# Patient Record
Sex: Male | Born: 1940 | Race: White | Hispanic: No | Marital: Married | Smoking: Never smoker
Health system: Southern US, Community
[De-identification: ages and names within clinical notes are randomized; demographics above are authoritative.]

## PROBLEM LIST (undated history)

## (undated) DIAGNOSIS — E785 Hyperlipidemia, unspecified: Secondary | ICD-10-CM

## (undated) DIAGNOSIS — Z86711 Personal history of pulmonary embolism: Secondary | ICD-10-CM

## (undated) DIAGNOSIS — I4892 Unspecified atrial flutter: Secondary | ICD-10-CM

## (undated) DIAGNOSIS — M1990 Unspecified osteoarthritis, unspecified site: Secondary | ICD-10-CM

## (undated) DIAGNOSIS — N183 Chronic kidney disease, stage 3 unspecified: Secondary | ICD-10-CM

## (undated) DIAGNOSIS — K219 Gastro-esophageal reflux disease without esophagitis: Secondary | ICD-10-CM

## (undated) DIAGNOSIS — J942 Hemothorax: Secondary | ICD-10-CM

## (undated) DIAGNOSIS — H9190 Unspecified hearing loss, unspecified ear: Secondary | ICD-10-CM

## (undated) DIAGNOSIS — I639 Cerebral infarction, unspecified: Secondary | ICD-10-CM

## (undated) DIAGNOSIS — J90 Pleural effusion, not elsewhere classified: Secondary | ICD-10-CM

## (undated) DIAGNOSIS — J449 Chronic obstructive pulmonary disease, unspecified: Secondary | ICD-10-CM

## (undated) DIAGNOSIS — I493 Ventricular premature depolarization: Secondary | ICD-10-CM

## (undated) DIAGNOSIS — M109 Gout, unspecified: Secondary | ICD-10-CM

## (undated) DIAGNOSIS — I82629 Acute embolism and thrombosis of deep veins of unspecified upper extremity: Secondary | ICD-10-CM

## (undated) DIAGNOSIS — Z951 Presence of aortocoronary bypass graft: Secondary | ICD-10-CM

## (undated) DIAGNOSIS — I5022 Chronic systolic (congestive) heart failure: Secondary | ICD-10-CM

## (undated) DIAGNOSIS — A419 Sepsis, unspecified organism: Secondary | ICD-10-CM

## (undated) DIAGNOSIS — Z7901 Long term (current) use of anticoagulants: Secondary | ICD-10-CM

## (undated) DIAGNOSIS — I2510 Atherosclerotic heart disease of native coronary artery without angina pectoris: Secondary | ICD-10-CM

## (undated) DIAGNOSIS — I255 Ischemic cardiomyopathy: Secondary | ICD-10-CM

## (undated) DIAGNOSIS — I10 Essential (primary) hypertension: Secondary | ICD-10-CM

## (undated) DIAGNOSIS — Q6102 Congenital multiple renal cysts: Secondary | ICD-10-CM

## (undated) DIAGNOSIS — E669 Obesity, unspecified: Secondary | ICD-10-CM

## (undated) DIAGNOSIS — D649 Anemia, unspecified: Secondary | ICD-10-CM

## (undated) DIAGNOSIS — I5020 Unspecified systolic (congestive) heart failure: Secondary | ICD-10-CM

## (undated) DIAGNOSIS — J45909 Unspecified asthma, uncomplicated: Secondary | ICD-10-CM

## (undated) DIAGNOSIS — Z8679 Personal history of other diseases of the circulatory system: Secondary | ICD-10-CM

## (undated) DIAGNOSIS — I700 Atherosclerosis of aorta: Secondary | ICD-10-CM

## (undated) DIAGNOSIS — Z950 Presence of cardiac pacemaker: Secondary | ICD-10-CM

## (undated) DIAGNOSIS — I442 Atrioventricular block, complete: Secondary | ICD-10-CM

## (undated) DIAGNOSIS — N138 Other obstructive and reflux uropathy: Secondary | ICD-10-CM

## (undated) DIAGNOSIS — N401 Enlarged prostate with lower urinary tract symptoms: Secondary | ICD-10-CM

## (undated) DIAGNOSIS — I6789 Other cerebrovascular disease: Secondary | ICD-10-CM

## (undated) DIAGNOSIS — I824Y9 Acute embolism and thrombosis of unspecified deep veins of unspecified proximal lower extremity: Secondary | ICD-10-CM

## (undated) DIAGNOSIS — R7303 Prediabetes: Secondary | ICD-10-CM

## (undated) DIAGNOSIS — N184 Chronic kidney disease, stage 4 (severe): Secondary | ICD-10-CM

## (undated) HISTORY — PX: IVC FILTER INSERTION: CATH118245

## (undated) HISTORY — PX: LUMBAR DISC SURGERY: SHX700

## (undated) HISTORY — PX: INSERT / REPLACE / REMOVE PACEMAKER: SUR710

## (undated) HISTORY — PX: BACK SURGERY: SHX140

## (undated) HISTORY — PX: LEG SURGERY: SHX1003

---

## 1998-11-20 DIAGNOSIS — J189 Pneumonia, unspecified organism: Secondary | ICD-10-CM

## 1998-11-20 HISTORY — DX: Pneumonia, unspecified organism: J18.9

## 2006-12-31 ENCOUNTER — Ambulatory Visit: Payer: Self-pay | Admitting: Internal Medicine

## 2007-05-30 ENCOUNTER — Ambulatory Visit: Payer: Self-pay | Admitting: Gastroenterology

## 2007-06-13 ENCOUNTER — Ambulatory Visit: Payer: Self-pay | Admitting: Gastroenterology

## 2010-09-30 ENCOUNTER — Ambulatory Visit: Payer: Self-pay

## 2010-11-04 ENCOUNTER — Inpatient Hospital Stay (HOSPITAL_COMMUNITY)
Admission: RE | Admit: 2010-11-04 | Discharge: 2010-11-05 | Payer: Self-pay | Source: Home / Self Care | Attending: Neurological Surgery | Admitting: Neurological Surgery

## 2011-01-31 LAB — APTT: aPTT: 27 seconds (ref 24–37)

## 2011-01-31 LAB — BASIC METABOLIC PANEL
BUN: 23 mg/dL (ref 6–23)
CO2: 32 mEq/L (ref 19–32)
Calcium: 9.6 mg/dL (ref 8.4–10.5)
Chloride: 101 mEq/L (ref 96–112)
Creatinine, Ser: 1.27 mg/dL (ref 0.4–1.5)
GFR calc Af Amer: >60 mL/min (ref >60)
GFR calc non Af Amer: 56 mL/min — ABNORMAL LOW (ref >60)
Glucose, Bld: 87 mg/dL (ref 70–99)
Potassium: 4.5 mEq/L (ref 3.5–5.1)
Sodium: 141 mEq/L (ref 135–145)

## 2011-01-31 LAB — CBC
HCT: 42.4 % (ref 39.0–52.0)
Hemoglobin: 14.3 g/dL (ref 13.0–17.0)
MCH: 31.9 pg (ref 26.0–34.0)
MCHC: 33.7 g/dL (ref 30.0–36.0)
MCV: 94.6 fL (ref 78.0–100.0)
Platelets: 168 10*3/uL (ref 150–400)
RBC: 4.48 MIL/uL (ref 4.22–5.81)
RDW: 12.3 % (ref 11.5–15.5)
WBC: 7.3 10*3/uL (ref 4.0–10.5)

## 2011-01-31 LAB — DIFFERENTIAL
Basophils Absolute: 0 10*3/uL (ref 0.0–0.1)
Basophils Relative: 0 % (ref 0–1)
Eosinophils Absolute: 0.5 10*3/uL (ref 0.0–0.7)
Eosinophils Relative: 6 % — ABNORMAL HIGH (ref 0–5)
Lymphocytes Relative: 31 % (ref 12–46)
Lymphs Abs: 2.3 10*3/uL (ref 0.7–4.0)
Monocytes Absolute: 0.7 10*3/uL (ref 0.1–1.0)
Monocytes Relative: 10 % (ref 3–12)
Neutro Abs: 3.8 10*3/uL (ref 1.7–7.7)
Neutrophils Relative %: 52 % (ref 43–77)

## 2011-01-31 LAB — SURGICAL PCR SCREEN
MRSA, PCR: NEGATIVE
Staphylococcus aureus: NEGATIVE

## 2011-01-31 LAB — PROTIME-INR
INR: 0.97 (ref 0.00–1.49)
Prothrombin Time: 13.1 seconds (ref 11.6–15.2)

## 2011-11-07 ENCOUNTER — Emergency Department: Payer: Self-pay | Admitting: Unknown Physician Specialty

## 2014-08-13 ENCOUNTER — Observation Stay: Payer: Self-pay | Admitting: Internal Medicine

## 2014-08-13 LAB — CBC WITH DIFFERENTIAL/PLATELET
Basophil #: 0.1 10*3/uL (ref 0.0–0.1)
Basophil %: 1.1 %
Eosinophil #: 0.3 10*3/uL (ref 0.0–0.7)
Eosinophil %: 3.5 %
HCT: 43.3 % (ref 40.0–52.0)
HGB: 14.5 g/dL (ref 13.0–18.0)
Lymphocyte #: 2.2 10*3/uL (ref 1.0–3.6)
Lymphocyte %: 25.7 %
MCH: 32.6 pg (ref 26.0–34.0)
MCHC: 33.5 g/dL (ref 32.0–36.0)
MCV: 97 fL (ref 80–100)
Monocyte #: 0.6 x10 3/mm (ref 0.2–1.0)
Monocyte %: 7.3 %
Neutrophil #: 5.2 10*3/uL (ref 1.4–6.5)
Neutrophil %: 62.4 %
Platelet: 194 10*3/uL (ref 150–440)
RBC: 4.45 10*6/uL (ref 4.40–5.90)
RDW: 13.8 % (ref 11.5–14.5)
WBC: 8.4 10*3/uL (ref 3.8–10.6)

## 2014-08-13 LAB — URINALYSIS, COMPLETE
Bacteria: NONE SEEN
Bilirubin,UR: NEGATIVE
Blood: NEGATIVE
Glucose,UR: NEGATIVE mg/dL (ref 0–75)
Ketone: NEGATIVE
Leukocyte Esterase: NEGATIVE
Nitrite: NEGATIVE
Ph: 6 (ref 4.5–8.0)
Protein: NEGATIVE
RBC,UR: <1 /HPF (ref 0–5)
Specific Gravity: 1.013 (ref 1.003–1.030)
Squamous Epithelial: NONE SEEN
WBC UR: NONE SEEN /HPF (ref 0–5)

## 2014-08-13 LAB — TROPONIN I: Troponin-I: <0.02 ng/mL

## 2014-08-13 LAB — BASIC METABOLIC PANEL
Anion Gap: 5 — ABNORMAL LOW (ref 7–16)
BUN: 29 mg/dL — ABNORMAL HIGH (ref 7–18)
Calcium, Total: 8.6 mg/dL (ref 8.5–10.1)
Chloride: 103 mmol/L (ref 98–107)
Co2: 31 mmol/L (ref 21–32)
Creatinine: 1.44 mg/dL — ABNORMAL HIGH (ref 0.60–1.30)
EGFR (African American): >60
EGFR (Non-African Amer.): 51 — ABNORMAL LOW
Glucose: 142 mg/dL — ABNORMAL HIGH (ref 65–99)
Osmolality: 286 (ref 275–301)
Potassium: 3.5 mmol/L (ref 3.5–5.1)
Sodium: 139 mmol/L (ref 136–145)

## 2014-08-13 LAB — PROTIME-INR
INR: 1
Prothrombin Time: 13.4 secs (ref 11.5–14.7)

## 2014-08-13 LAB — APTT: Activated PTT: 30 secs (ref 23.6–35.9)

## 2014-08-14 DIAGNOSIS — I517 Cardiomegaly: Secondary | ICD-10-CM

## 2014-08-14 DIAGNOSIS — R55 Syncope and collapse: Secondary | ICD-10-CM

## 2014-08-14 LAB — CREATININE, SERUM
Creatinine: 1.18 mg/dL (ref 0.60–1.30)
EGFR (African American): >60
EGFR (Non-African Amer.): >60

## 2014-08-14 LAB — BASIC METABOLIC PANEL
Anion Gap: 6 — ABNORMAL LOW (ref 7–16)
BUN: 30 mg/dL — ABNORMAL HIGH (ref 7–18)
Calcium, Total: 8.3 mg/dL — ABNORMAL LOW (ref 8.5–10.1)
Chloride: 106 mmol/L (ref 98–107)
Co2: 29 mmol/L (ref 21–32)
Creatinine: 1.36 mg/dL — ABNORMAL HIGH (ref 0.60–1.30)
EGFR (African American): >60
EGFR (Non-African Amer.): 55 — ABNORMAL LOW
Glucose: 106 mg/dL — ABNORMAL HIGH (ref 65–99)
Osmolality: 288 (ref 275–301)
Potassium: 3.6 mmol/L (ref 3.5–5.1)
Sodium: 141 mmol/L (ref 136–145)

## 2014-08-14 LAB — TROPONIN I
Troponin-I: 0.02 ng/mL
Troponin-I: <0.02 ng/mL

## 2014-08-19 ENCOUNTER — Encounter: Payer: Self-pay | Admitting: Cardiovascular Disease
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
## 2015-03-13 NOTE — Discharge Summary (Signed)
PATIENT NAME:  Sean [name], Sean [name] MR#:  [id number] DATE OF BIRTH:  [birth year]  DATE OF ADMISSION:  08/13/2014 DATE OF DISCHARGE:  08/14/2014  ADMITTING DIAGNOSIS: Syncope.   DISCHARGE DIAGNOSES: 1.  Syncope.  2.  Acute renal failure due to dehydration, resolved on IV fluids.  3.  Malignant essential hypertension.  4.  Asthma.   DISCHARGE CONDITION: Stable.   DISCHARGE MEDICATIONS: The patient is to continue Advair Diskus 150/50 one daily,  allopurinol 300 mg p.o. daily, ProAir HFA 2 puffs 4 times daily as needed, multivitamins 1 tablet once daily, new medication Norvasc 5 mg p.o. twice daily. The patient is not to take HCTZ.   HOME OXYGEN: None.   DIET: Low salt, regular consistency.   ACTIVITY LIMITATIONS: As tolerated.    FOLLOWUP APPOINTMENTS: Dr. Hardin Negus in 2 days after discharge. The patient was advised to keep a low-salt diet and follow his blood pressure very frequently and show the recordings to Dr. Hardin Negus.    CONSULTANTS: Care management, social work, cardiologist, Mr. Christell Faith, [place] for Dr. Fletcher Anon, and Dr. Fletcher Anon.    RADIOLOGIC STUDIES: Chest x-ray portable single view 08/13/2014, showed no active disease. CT of head without contrast 08/13/2014, revealed no acute intracranial findings, mild chronic small vessel disease was noted. Myoview stress test 08/14/2014, exercise myocardial perfusion study with no significant ischemia. Thinning noted in apical region consistent with attenuation artifact. No significant wall motion abnormality noted. The estimated ejection fraction was 59%. The left ventricular global function was normal. There was no EKG change concerning for ischemia. There was attenuation artifact noted in the study. Overall, this was a low-risk scan. Echocardiogram 08/14/2014, left ventricular ejection fraction by visual estimation 60% to 65%, normal global left ventricular systolic function, impaired relaxation pattern of left ventricular diastolic filling, mild left  ventricular hypertrophy, normal right ventricular size and systolic function, normal right ventricular systolic pressures. Carotid ultrasound 08/14/2014, focal heterogeneous atherosclerotic plaque resulting in less than 50% diameter stenosis in the left internal carotid artery, trace Schmorl's heterogeneous atherosclerotic plaque on the right without evidence of stenosis. Vertebral arteries are patent with normal antegrade flow.   The patient is a 74 year old Caucasian male with past medical history significant for history of hypertension, who is on HCTZ, presents to the hospital after exercising in the health center vigorously. Please refer to Dr. Samantha Crimes admission note on 08/13/2014. On arrival to the hospital, the patient was hypertensive with systolic blood pressure of 155/92, initially at 184/92, temperature was 98.2, heart rate was 92, respiration rate was 18, and saturation was 98% on room air. Physical exam was unremarkable.   The patient's lab data done on arrival to the hospital 08/13/2014, revealed elevated BUN and creatinine to 29 and 1.44, glucose 142, otherwise BMP was unremarkable. Cardiac enzymes x 3 were normal. White blood cell count was normal at 8.4, hemoglobin was 14.5, platelet count was 194,000. Absolute neutrophil count was normal at 5.2. Coagulation panel was normal. Urinalysis was normal. 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 The patient was admitted to the hospital for further evaluation. His cardiac enzymes were cycled and consultation with cardiologist was obtained. Cardiologist recommended echocardiogram, as well as stress test, which were performed. Both of those did not show any significant abnormalities. It was felt that the patient's syncope could likely be related to dehydration since the patient was using HCTZ, as well as exercising vigorously.  In fact, he has been doing this 5 times a week, making use of HCTZ likely unacceptable for this gentleman. It was felt that it is not coronary artery disease related event. In regard to acute renal failure, the patient was rehydrated and his kidney function normalized. In regard to malignant essential hypertension, the patient's blood pressure medications were changed from HCTZ to Norvasc. The patient is to use Norvasc twice daily and add any other medications if needed. Meanwhile, he was recommended to continue a low-fat, low-salt diet, and follow his blood pressure readings closely and follow up with Dr. Hardin Negus for further recommendations. On the day of discharge, 08/14/2014, he is afebrile with temperature 97.8, pulse of 73, respiration rate was 20, blood pressure 137/74, saturation was 95% on room air at rest. Of note, the patient's orthostatic vital signs were performed after he was rehydrated and those were normal.    ____________________________ Theodoro Grist, MD rv:at D: 08/14/2014 17:30:09 ET T: 08/14/2014 18:21:47 ET JOB#: [id number]  cc: Theodoro Grist, MD, <Dictator> Wilhelmena Zea MD ELECTRONICALLY SIGNED 08/27/2014 17:01

## 2015-12-13 DIAGNOSIS — H349 Unspecified retinal vascular occlusion: Secondary | ICD-10-CM | POA: Diagnosis not present

## 2015-12-13 DIAGNOSIS — Z961 Presence of intraocular lens: Secondary | ICD-10-CM | POA: Diagnosis not present

## 2015-12-31 DIAGNOSIS — I10 Essential (primary) hypertension: Secondary | ICD-10-CM | POA: Diagnosis not present

## 2015-12-31 DIAGNOSIS — J45998 Other asthma: Secondary | ICD-10-CM | POA: Diagnosis not present

## 2016-01-24 DIAGNOSIS — Z125 Encounter for screening for malignant neoplasm of prostate: Secondary | ICD-10-CM | POA: Diagnosis not present

## 2016-01-24 DIAGNOSIS — Z0000 Encounter for general adult medical examination without abnormal findings: Secondary | ICD-10-CM | POA: Diagnosis not present

## 2016-01-24 DIAGNOSIS — L723 Sebaceous cyst: Secondary | ICD-10-CM | POA: Diagnosis not present

## 2016-01-24 DIAGNOSIS — L089 Local infection of the skin and subcutaneous tissue, unspecified: Secondary | ICD-10-CM | POA: Diagnosis not present

## 2016-01-26 DIAGNOSIS — L728 Other follicular cysts of the skin and subcutaneous tissue: Secondary | ICD-10-CM | POA: Diagnosis not present

## 2016-05-10 DIAGNOSIS — H43391 Other vitreous opacities, right eye: Secondary | ICD-10-CM | POA: Diagnosis not present

## 2016-05-10 DIAGNOSIS — H35033 Hypertensive retinopathy, bilateral: Secondary | ICD-10-CM | POA: Diagnosis not present

## 2016-05-10 DIAGNOSIS — H43811 Vitreous degeneration, right eye: Secondary | ICD-10-CM | POA: Diagnosis not present

## 2016-05-10 DIAGNOSIS — H3561 Retinal hemorrhage, right eye: Secondary | ICD-10-CM | POA: Diagnosis not present

## 2016-06-15 DIAGNOSIS — H35033 Hypertensive retinopathy, bilateral: Secondary | ICD-10-CM | POA: Diagnosis not present

## 2016-06-15 DIAGNOSIS — H43811 Vitreous degeneration, right eye: Secondary | ICD-10-CM | POA: Diagnosis not present

## 2016-06-15 DIAGNOSIS — H43391 Other vitreous opacities, right eye: Secondary | ICD-10-CM | POA: Diagnosis not present

## 2016-06-15 DIAGNOSIS — H3561 Retinal hemorrhage, right eye: Secondary | ICD-10-CM | POA: Diagnosis not present

## 2016-07-06 DIAGNOSIS — J45998 Other asthma: Secondary | ICD-10-CM | POA: Diagnosis not present

## 2016-07-06 DIAGNOSIS — M1A09X0 Idiopathic chronic gout, multiple sites, without tophus (tophi): Secondary | ICD-10-CM | POA: Diagnosis not present

## 2016-07-06 DIAGNOSIS — I10 Essential (primary) hypertension: Secondary | ICD-10-CM | POA: Diagnosis not present

## 2017-01-08 ENCOUNTER — Other Ambulatory Visit: Payer: Self-pay | Admitting: Family Medicine

## 2017-01-08 DIAGNOSIS — M899 Disorder of bone, unspecified: Secondary | ICD-10-CM | POA: Diagnosis not present

## 2017-01-08 DIAGNOSIS — M1A09X0 Idiopathic chronic gout, multiple sites, without tophus (tophi): Secondary | ICD-10-CM | POA: Diagnosis not present

## 2017-01-08 DIAGNOSIS — R221 Localized swelling, mass and lump, neck: Secondary | ICD-10-CM

## 2017-01-08 DIAGNOSIS — M898X8 Other specified disorders of bone, other site: Secondary | ICD-10-CM

## 2017-01-08 DIAGNOSIS — I10 Essential (primary) hypertension: Secondary | ICD-10-CM | POA: Diagnosis not present

## 2017-01-08 DIAGNOSIS — M19011 Primary osteoarthritis, right shoulder: Secondary | ICD-10-CM | POA: Diagnosis not present

## 2017-01-11 ENCOUNTER — Ambulatory Visit
Admission: RE | Admit: 2017-01-11 | Discharge: 2017-01-11 | Disposition: A | Discharge status: Home / Self Care | Payer: PPO | Source: Ambulatory Visit | Attending: Family Medicine | Admitting: Family Medicine

## 2017-01-11 DIAGNOSIS — M898X8 Other specified disorders of bone, other site: Secondary | ICD-10-CM

## 2017-01-11 DIAGNOSIS — M899 Disorder of bone, unspecified: Secondary | ICD-10-CM | POA: Insufficient documentation

## 2017-01-15 ENCOUNTER — Other Ambulatory Visit: Payer: Self-pay | Admitting: Family Medicine

## 2017-01-15 DIAGNOSIS — M898X8 Other specified disorders of bone, other site: Secondary | ICD-10-CM

## 2017-01-18 ENCOUNTER — Ambulatory Visit
Admission: RE | Admit: 2017-01-18 | Discharge: 2017-01-18 | Disposition: A | Discharge status: Home / Self Care | Payer: PPO | Source: Ambulatory Visit | Attending: Family Medicine | Admitting: Family Medicine

## 2017-01-24 DIAGNOSIS — M19011 Primary osteoarthritis, right shoulder: Secondary | ICD-10-CM | POA: Diagnosis not present

## 2017-01-30 ENCOUNTER — Ambulatory Visit: Payer: PPO

## 2017-02-27 DIAGNOSIS — M1712 Unilateral primary osteoarthritis, left knee: Secondary | ICD-10-CM | POA: Diagnosis not present

## 2017-02-27 DIAGNOSIS — G8929 Other chronic pain: Secondary | ICD-10-CM | POA: Diagnosis not present

## 2017-02-27 DIAGNOSIS — M25562 Pain in left knee: Secondary | ICD-10-CM | POA: Diagnosis not present

## 2017-03-01 DIAGNOSIS — M1712 Unilateral primary osteoarthritis, left knee: Secondary | ICD-10-CM | POA: Diagnosis not present

## 2017-03-15 DIAGNOSIS — M109 Gout, unspecified: Secondary | ICD-10-CM | POA: Diagnosis not present

## 2017-03-15 DIAGNOSIS — I10 Essential (primary) hypertension: Secondary | ICD-10-CM | POA: Diagnosis not present

## 2017-03-15 DIAGNOSIS — J4530 Mild persistent asthma, uncomplicated: Secondary | ICD-10-CM | POA: Diagnosis not present

## 2017-04-13 ENCOUNTER — Encounter: Payer: Self-pay | Admitting: Gastroenterology

## 2017-06-11 ENCOUNTER — Encounter: Payer: Self-pay | Admitting: Emergency Medicine

## 2017-06-11 ENCOUNTER — Emergency Department: Payer: PPO

## 2017-06-11 ENCOUNTER — Emergency Department
Admission: EM | Admit: 2017-06-11 | Discharge: 2017-06-11 | Disposition: A | Discharge status: Other Acute Inpatient Hospital | Payer: PPO | Attending: Emergency Medicine | Admitting: Emergency Medicine

## 2017-06-11 ENCOUNTER — Ambulatory Visit (HOSPITAL_COMMUNITY)
Admission: EM | Admit: 2017-06-11 | Discharge: 2017-06-12 | Disposition: A | Discharge status: Home / Self Care | Payer: PPO | Source: Ambulatory Visit | Attending: Internal Medicine | Admitting: Internal Medicine

## 2017-06-11 ENCOUNTER — Inpatient Hospital Stay (HOSPITAL_BASED_OUTPATIENT_CLINIC_OR_DEPARTMENT_OTHER): Admission: AD | Admit: 2017-06-11 | Payer: PPO | Source: Other Acute Inpatient Hospital | Admitting: Internal Medicine

## 2017-06-11 ENCOUNTER — Ambulatory Visit (HOSPITAL_COMMUNITY)
Admission: EM | Disposition: A | Discharge status: Home / Self Care | Payer: Self-pay | Source: Ambulatory Visit | Attending: Internal Medicine

## 2017-06-11 DIAGNOSIS — M545 Low back pain: Secondary | ICD-10-CM | POA: Diagnosis not present

## 2017-06-11 DIAGNOSIS — Z7982 Long term (current) use of aspirin: Secondary | ICD-10-CM | POA: Diagnosis not present

## 2017-06-11 DIAGNOSIS — E669 Obesity, unspecified: Secondary | ICD-10-CM | POA: Diagnosis not present

## 2017-06-11 DIAGNOSIS — I509 Heart failure, unspecified: Secondary | ICD-10-CM

## 2017-06-11 DIAGNOSIS — I442 Atrioventricular block, complete: Secondary | ICD-10-CM | POA: Diagnosis not present

## 2017-06-11 DIAGNOSIS — I129 Hypertensive chronic kidney disease with stage 1 through stage 4 chronic kidney disease, or unspecified chronic kidney disease: Secondary | ICD-10-CM | POA: Diagnosis not present

## 2017-06-11 DIAGNOSIS — J45909 Unspecified asthma, uncomplicated: Secondary | ICD-10-CM | POA: Insufficient documentation

## 2017-06-11 DIAGNOSIS — R42 Dizziness and giddiness: Secondary | ICD-10-CM

## 2017-06-11 DIAGNOSIS — R079 Chest pain, unspecified: Secondary | ICD-10-CM | POA: Diagnosis not present

## 2017-06-11 DIAGNOSIS — R0602 Shortness of breath: Secondary | ICD-10-CM

## 2017-06-11 DIAGNOSIS — Z6833 Body mass index (BMI) 33.0-33.9, adult: Secondary | ICD-10-CM | POA: Diagnosis not present

## 2017-06-11 DIAGNOSIS — Z950 Presence of cardiac pacemaker: Secondary | ICD-10-CM

## 2017-06-11 DIAGNOSIS — Z7951 Long term (current) use of inhaled steroids: Secondary | ICD-10-CM | POA: Insufficient documentation

## 2017-06-11 DIAGNOSIS — M109 Gout, unspecified: Secondary | ICD-10-CM | POA: Diagnosis not present

## 2017-06-11 DIAGNOSIS — N179 Acute kidney failure, unspecified: Secondary | ICD-10-CM | POA: Diagnosis present

## 2017-06-11 DIAGNOSIS — R6889 Other general symptoms and signs: Secondary | ICD-10-CM | POA: Insufficient documentation

## 2017-06-11 DIAGNOSIS — R001 Bradycardia, unspecified: Secondary | ICD-10-CM | POA: Diagnosis not present

## 2017-06-11 DIAGNOSIS — I10 Essential (primary) hypertension: Secondary | ICD-10-CM | POA: Diagnosis not present

## 2017-06-11 DIAGNOSIS — N183 Chronic kidney disease, stage 3 (moderate): Secondary | ICD-10-CM | POA: Insufficient documentation

## 2017-06-11 DIAGNOSIS — N189 Chronic kidney disease, unspecified: Secondary | ICD-10-CM | POA: Diagnosis present

## 2017-06-11 HISTORY — PX: PACEMAKER IMPLANT: EP1218

## 2017-06-11 HISTORY — DX: Chronic kidney disease, stage 3 unspecified: N18.30

## 2017-06-11 HISTORY — DX: Chronic kidney disease, stage 3 (moderate): N18.3

## 2017-06-11 HISTORY — DX: Obesity, unspecified: E66.9

## 2017-06-11 HISTORY — DX: Essential (primary) hypertension: I10

## 2017-06-11 HISTORY — DX: Presence of cardiac pacemaker: Z95.0

## 2017-06-11 HISTORY — DX: Unspecified asthma, uncomplicated: J45.909

## 2017-06-11 HISTORY — DX: Personal history of other diseases of the circulatory system: Z86.79

## 2017-06-11 LAB — COMPREHENSIVE METABOLIC PANEL
ALT: 43 U/L (ref 17–63)
AST: 42 U/L — ABNORMAL HIGH (ref 15–41)
Albumin: 3.8 g/dL (ref 3.5–5.0)
Alkaline Phosphatase: 58 U/L (ref 38–126)
Anion gap: 11 (ref 5–15)
BUN: 40 mg/dL — ABNORMAL HIGH (ref 6–20)
CO2: 21 mmol/L — ABNORMAL LOW (ref 22–32)
Calcium: 8.8 mg/dL — ABNORMAL LOW (ref 8.9–10.3)
Chloride: 103 mmol/L (ref 101–111)
Creatinine, Ser: 2.05 mg/dL — ABNORMAL HIGH (ref 0.61–1.24)
GFR calc Af Amer: 35 mL/min — ABNORMAL LOW (ref >60)
GFR calc non Af Amer: 30 mL/min — ABNORMAL LOW (ref >60)
Glucose, Bld: 189 mg/dL — ABNORMAL HIGH (ref 65–99)
Potassium: 4.3 mmol/L (ref 3.5–5.1)
Sodium: 135 mmol/L (ref 135–145)
Total Bilirubin: 1.5 mg/dL — ABNORMAL HIGH (ref 0.3–1.2)
Total Protein: 6.3 g/dL — ABNORMAL LOW (ref 6.5–8.1)

## 2017-06-11 LAB — CBC
HCT: 38.9 % — ABNORMAL LOW (ref 40.0–52.0)
Hemoglobin: 13 g/dL (ref 13.0–18.0)
MCH: 32.3 pg (ref 26.0–34.0)
MCHC: 33.4 g/dL (ref 32.0–36.0)
MCV: 96.5 fL (ref 80.0–100.0)
Platelets: 129 10*3/uL — ABNORMAL LOW (ref 150–440)
RBC: 4.03 MIL/uL — ABNORMAL LOW (ref 4.40–5.90)
RDW: 13.7 % (ref 11.5–14.5)
WBC: 11.9 10*3/uL — ABNORMAL HIGH (ref 3.8–10.6)

## 2017-06-11 LAB — BRAIN NATRIURETIC PEPTIDE: B Natriuretic Peptide: 627 pg/mL — ABNORMAL HIGH (ref 0.0–100.0)

## 2017-06-11 LAB — TSH: TSH: 5.076 u[IU]/mL — ABNORMAL HIGH (ref 0.350–4.500)

## 2017-06-11 LAB — MAGNESIUM: Magnesium: 2.1 mg/dL (ref 1.7–2.4)

## 2017-06-11 LAB — TROPONIN I: Troponin I: 0.07 ng/mL (ref <0.03)

## 2017-06-11 LAB — SURGICAL PCR SCREEN
MRSA, PCR: NEGATIVE
Staphylococcus aureus: NEGATIVE

## 2017-06-11 LAB — PROTIME-INR
INR: 1.19
Prothrombin Time: 15.2 seconds (ref 11.4–15.2)

## 2017-06-11 LAB — APTT: aPTT: 32 seconds (ref 24–36)

## 2017-06-11 SURGERY — PACEMAKER IMPLANT

## 2017-06-11 MED ORDER — LIDOCAINE HCL (PF) 1 % IJ SOLN
INTRAMUSCULAR | Status: AC
  Filled 2017-06-11: qty 60

## 2017-06-11 MED ORDER — FENTANYL CITRATE (PF) 100 MCG/2ML IJ SOLN
INTRAMUSCULAR | Status: AC
  Filled 2017-06-11: qty 2

## 2017-06-11 MED ORDER — HEPARIN (PORCINE) IN NACL 2-0.9 UNIT/ML-% IJ SOLN
INTRAMUSCULAR | Status: AC | PRN
  Administered 2017-06-11: 500 mL

## 2017-06-11 MED ORDER — MUPIROCIN 2 % EX OINT
TOPICAL_OINTMENT | CUTANEOUS | Status: AC
  Filled 2017-06-11: qty 22

## 2017-06-11 MED ORDER — ADULT MULTIVITAMIN W/MINERALS CH
1.0000 | ORAL_TABLET | Freq: Every day | ORAL | Status: DC
  Administered 2017-06-12: 1 via ORAL
  Filled 2017-06-11: qty 1

## 2017-06-11 MED ORDER — ALLOPURINOL 300 MG PO TABS
300.0000 mg | ORAL_TABLET | Freq: Every day | ORAL | Status: DC
  Administered 2017-06-12: 300 mg via ORAL
  Filled 2017-06-11: qty 1

## 2017-06-11 MED ORDER — CEFAZOLIN SODIUM-DEXTROSE 2-4 GM/100ML-% IV SOLN
INTRAVENOUS | Status: AC
  Filled 2017-06-11: qty 100

## 2017-06-11 MED ORDER — ALBUTEROL SULFATE (2.5 MG/3ML) 0.083% IN NEBU: 3.0000 mL | INHALATION_SOLUTION | RESPIRATORY_TRACT | Status: DC | PRN

## 2017-06-11 MED ORDER — CEFAZOLIN SODIUM-DEXTROSE 1-4 GM/50ML-% IV SOLN
1.0000 g | Freq: Four times a day (QID) | INTRAVENOUS | Status: AC
  Administered 2017-06-11 – 2017-06-12 (×3): 1 g via INTRAVENOUS
  Filled 2017-06-11 (×3): qty 50

## 2017-06-11 MED ORDER — SODIUM CHLORIDE 0.9 % IR SOLN
80.0000 mg | Status: AC
  Administered 2017-06-11: 80 mg

## 2017-06-11 MED ORDER — CEFAZOLIN SODIUM-DEXTROSE 2-4 GM/100ML-% IV SOLN
2.0000 g | INTRAVENOUS | Status: AC
  Administered 2017-06-11: 2 g via INTRAVENOUS

## 2017-06-11 MED ORDER — ONDANSETRON HCL 4 MG/2ML IJ SOLN: 4.0000 mg | Freq: Four times a day (QID) | INTRAMUSCULAR | Status: DC | PRN

## 2017-06-11 MED ORDER — AMLODIPINE BESYLATE 5 MG PO TABS
5.0000 mg | ORAL_TABLET | Freq: Two times a day (BID) | ORAL | Status: DC
  Administered 2017-06-11 – 2017-06-12 (×2): 5 mg via ORAL
  Filled 2017-06-11 (×2): qty 1

## 2017-06-11 MED ORDER — MUPIROCIN 2 % EX OINT: TOPICAL_OINTMENT | Freq: Once | CUTANEOUS | Status: DC

## 2017-06-11 MED ORDER — MIDAZOLAM HCL 5 MG/5ML IJ SOLN
INTRAMUSCULAR | Status: DC | PRN
  Administered 2017-06-11: 2 mg via INTRAVENOUS

## 2017-06-11 MED ORDER — MIDAZOLAM HCL 5 MG/5ML IJ SOLN
INTRAMUSCULAR | Status: AC
  Filled 2017-06-11: qty 5

## 2017-06-11 MED ORDER — LIDOCAINE HCL (PF) 1 % IJ SOLN
INTRAMUSCULAR | Status: DC | PRN
Start: 2017-06-11 — End: 2017-06-11
  Administered 2017-06-11: 45 mL via INTRADERMAL

## 2017-06-11 MED ORDER — SODIUM CHLORIDE 0.9 % IV SOLN: INTRAVENOUS | Status: DC

## 2017-06-11 MED ORDER — CHLORHEXIDINE GLUCONATE 4 % EX LIQD: 60.0000 mL | Freq: Once | CUTANEOUS | Status: DC

## 2017-06-11 MED ORDER — ACETAMINOPHEN 325 MG PO TABS
325.0000 mg | ORAL_TABLET | ORAL | Status: DC | PRN
  Administered 2017-06-11 – 2017-06-12 (×3): 650 mg via ORAL
  Filled 2017-06-11 (×3): qty 2

## 2017-06-11 MED ORDER — CEFAZOLIN SODIUM-DEXTROSE 2-4 GM/100ML-% IV SOLN
INTRAVENOUS | Status: AC
Start: 2017-06-11 — End: ?
  Filled 2017-06-11: qty 100

## 2017-06-11 MED ORDER — FENTANYL CITRATE (PF) 100 MCG/2ML IJ SOLN
INTRAMUSCULAR | Status: DC | PRN
  Administered 2017-06-11: 25 ug via INTRAVENOUS

## 2017-06-11 MED ORDER — SODIUM CHLORIDE 0.9 % IV BOLUS (SEPSIS)
1000.0000 mL | Freq: Once | INTRAVENOUS | Status: AC
  Administered 2017-06-11: 1000 mL via INTRAVENOUS

## 2017-06-11 MED ORDER — HEPARIN (PORCINE) IN NACL 2-0.9 UNIT/ML-% IJ SOLN
INTRAMUSCULAR | Status: AC
  Filled 2017-06-11: qty 500

## 2017-06-11 MED ORDER — SODIUM CHLORIDE 0.9 % IR SOLN
Status: AC
  Filled 2017-06-11: qty 2

## 2017-06-11 SURGICAL SUPPLY — 12 items
CABLE SURGICAL S-101-97-12 (CABLE) ×1 IMPLANT
CATH RIGHTSITE C315HIS02 (CATHETERS) ×1 IMPLANT
IPG PACE AZUR XT DR MRI W1DR01 (Pacemaker) IMPLANT
LEAD CAPSURE NOVUS 5076-52CM (Lead) ×1 IMPLANT
LEAD SELECT SECURE 3830 383069 (Lead) IMPLANT
PACE AZURE XT DR MRI W1DR01 (Pacemaker) ×2 IMPLANT
PAD DEFIB LIFELINK (PAD) ×1 IMPLANT
SELECT SECURE 3830 383069 (Lead) ×2 IMPLANT
SHEATH CLASSIC 7F (SHEATH) ×2 IMPLANT
SLITTER UNIVERSAL DS2A003 (MISCELLANEOUS) ×1 IMPLANT
TRAY PACEMAKER INSERTION (PACKS) ×1 IMPLANT
WIRE HI TORQ VERSACORE-J 145CM (WIRE) ×1 IMPLANT

## 2017-06-11 NOTE — ED Triage Notes (Signed)
Pt c/o shortness of breath that started Friday.  Just returned from [place] that previous Monday (13 hr drive each way).  Pt also c/o low back pain and intermittent lightheadedness.  No chest pain at this time. Reports cannot walk from chair in triage to door without stopping because cannot breath.  Did have back surgery 5 years ago and has not had pain since then until now.

## 2017-06-11 NOTE — ED Notes (Signed)
Pt placed on 2lnc. 

## 2017-06-11 NOTE — Consult Note (Signed)
ELECTROPHYSIOLOGY HISTORY AND PHYSICAL NOTE    Patient ID: Sean [name] MRN: [id number], DOB/AGE: 1941/08/21 76 y.o.  Admit date: 06/11/2017 Date of Consult: 06/11/2017  Primary Physician: Patient, No Pcp Per Primary Cardiologist: new to [place]  Reason for Consultation: complete heart block  HPI:  Sean [name] is a 76 y.o. male is referred by Dr Fletcher Anon for evaluation of complete heart block.  Past medical history is notable for CKD, stage III, hypertension, and gout. He presented to [facility] ER earlier today with complaints of back pain and was found to be in complete heart block with ventricular rates in the 20-30's. He was transported to [facility] for pacemaker evaluation.  He is on no AVN blocking agents. He has had a couple of days of shortness of breath with exertion as well as fatigue.   He currently reports feeling well with no chest pain, LE edema, recent fevers chills, nausea or vomiting.  He has no functional limitations at home.   Past Medical History:  Diagnosis Date  . Asthma   . CKD (chronic kidney disease), stage III   . Hypertension   . Obesity      Surgical History:  Past Surgical History:  Procedure Laterality Date  . BACK SURGERY    . LEG SURGERY       Prescriptions Prior to Admission  Medication Sig Dispense Refill Last Dose  . allopurinol (ZYLOPRIM) 300 MG tablet Take 300 mg by mouth daily.  0 06/11/2017 at Unknown time  . amLODipine (NORVASC) 5 MG tablet Take 5 mg by mouth 2 (two) times daily.  1 06/11/2017 at Unknown time  . aspirin EC 81 MG tablet Take 81 mg by mouth daily.   06/11/2017 at Unknown time  . Fluticasone-Salmeterol (ADVAIR) 250-50 MCG/DOSE AEPB Inhale 1 puff into the lungs 2 (two) times daily.   06/11/2017 at Unknown time  . Multiple Vitamin (MULTIVITAMIN) tablet Take 1 tablet by mouth daily.   06/10/2017 at Unknown time  . NON FORMULARY Take 1 tablet by mouth daily. otc prostate multivitamin   06/10/2017 at Unknown time  . albuterol (PROVENTIL  HFA;VENTOLIN HFA) 108 (90 Base) MCG/ACT inhaler Inhale 1-2 puffs into the lungs every 4 (four) hours as needed for wheezing or shortness of breath.   PRN    Inpatient Medications:   Allergies:  Allergies  Allergen Reactions  . Azithromycin Hives    Social History   Social History  . Marital status: Married    Spouse name: N/A  . Number of children: N/A  . Years of education: N/A   Occupational History  . Not on file.   Social History Main Topics  . Smoking status: Never Smoker  . Smokeless tobacco: Never Used  . Alcohol use Yes  . Drug use: No  . Sexual activity: Not on file   Other Topics Concern  . Not on file   Social History Narrative  . No narrative on file     Family History  Problem Relation Age of Onset  . Stroke Mother   . Stroke Father      Review of Systems: All other systems reviewed and are otherwise negative except as noted above.  Physical Exam: Vitals:   06/11/17 1310  BP: (!) 162/124  Pulse: (!) 43  Resp: (!) 7  SpO2: 99%    GEN- The patient is a 76 yo and obese appearing, alert and oriented x 3 today.   HEENT: normocephalic, atraumatic; sclera clear, conjunctiva pink;  hearing intact; oropharynx clear; neck supple  Lungs- Clear to ausculation bilaterally, normal work of breathing.  No wheezes, rales, rhonchi Heart- Bradycardic regular rate and rhythm  GI- soft, non-tender, non-distended, bowel sounds present Extremities- no clubbing, cyanosis, or edema  MS- no significant deformity or atrophy Skin- warm and dry, no rash or lesion Psych- euthymic mood, full affect Neuro- strength and sensation are intact  Labs:   Lab Results  Component Value Date   WBC 11.9 (H) 06/11/2017   HGB 13.0 06/11/2017   HCT 38.9 (L) 06/11/2017   MCV 96.5 06/11/2017   PLT 129 (L) 06/11/2017     Recent Labs Lab 06/11/17 1001  NA 135  K 4.3  CL 103  CO2 21*  BUN 40*  CREATININE 2.05*  CALCIUM 8.8*  PROT 6.3*  BILITOT 1.5*  ALKPHOS 58  ALT  43  AST 42*  GLUCOSE 189*      Radiology/Studies: Dg Chest Portable 1 View  Result Date: 06/11/2017 CLINICAL DATA:  SOB, intermittent lightheadedness x 3 days. Pt denies chest pain. Hx - HTN, asthma, non-smoker. EXAM: PORTABLE CHEST 1 VIEW COMPARISON:  Chest x-ray dated 08/13/2014. FINDINGS: Heart size is upper normal, likely accentuated by patient positioning. Aortic atherosclerosis. Mild prominence of the interstitial markings bilaterally. Probable atelectasis and/or small effusion at the left lung base. No pneumothorax seen. Osseous structures about the chest are unremarkable. IMPRESSION: 1. Mildly prominent interstitial markings bilaterally suggesting mild interstitial edema, perhaps mild volume overload. No overt alveolar pulmonary edema. 2. Probable atelectasis and/or small effusion at the left lung base. 3. Aortic atherosclerosis. Electronically Signed   By: Franki Cabot M.D.   On: 06/11/2017 10:23    ERD:[id number] heart block, ventricular rate 26 (personally reviewed)  TELEMETRY: complete heart block  (personally reviewed)   Assessment/Plan: 1.  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  HTN Stable No change required today  3.  CKD Stable No change required today     Signed, Chanetta Marshall, NP 06/11/2017 1:49 PM  EP Attending  Patient seen and examined. Agree with above. He has CHB and is symptomatic with no reversible causes. I have discussed the indications/risks/benefits/goals/expectations of PPM insertion and he wishes to proceed.   Mikle Bosworth.D.

## 2017-06-11 NOTE — H&P (View-Only) (Signed)
ELECTROPHYSIOLOGY HISTORY AND PHYSICAL NOTE    Patient ID: MACDONALD RIGOR MRN: [id number], DOB/AGE: January 06, 1941 76 y.o.  Admit date: 06/11/2017 Date of Consult: 06/11/2017  Primary Physician: Patient, No Pcp Per Primary Cardiologist: new to [place]  Reason for Consultation: complete heart block  HPI:  NHAT HEARNE is a 76 y.o. male is referred by Dr Fletcher Anon for evaluation of complete heart block.  Past medical history is notable for CKD, stage III, hypertension, and gout. He presented to [facility] ER earlier today with complaints of back pain and was found to be in complete heart block with ventricular rates in the 20-30's. He was transported to [facility] for pacemaker evaluation.  He is on no AVN blocking agents. He has had a couple of days of shortness of breath with exertion as well as fatigue.   He currently reports feeling well with no chest pain, LE edema, recent fevers chills, nausea or vomiting.  He has no functional limitations at home.   Past Medical History:  Diagnosis Date  . Asthma   . CKD (chronic kidney disease), stage III   . Hypertension   . Obesity      Surgical History:  Past Surgical History:  Procedure Laterality Date  . BACK SURGERY    . LEG SURGERY       Prescriptions Prior to Admission  Medication Sig Dispense Refill Last Dose  . allopurinol (ZYLOPRIM) 300 MG tablet Take 300 mg by mouth daily.  0 06/11/2017 at Unknown time  . amLODipine (NORVASC) 5 MG tablet Take 5 mg by mouth 2 (two) times daily.  1 06/11/2017 at Unknown time  . aspirin EC 81 MG tablet Take 81 mg by mouth daily.   06/11/2017 at Unknown time  . Fluticasone-Salmeterol (ADVAIR) 250-50 MCG/DOSE AEPB Inhale 1 puff into the lungs 2 (two) times daily.   06/11/2017 at Unknown time  . Multiple Vitamin (MULTIVITAMIN) tablet Take 1 tablet by mouth daily.   06/10/2017 at Unknown time  . NON FORMULARY Take 1 tablet by mouth daily. otc prostate multivitamin   06/10/2017 at Unknown time  . albuterol (PROVENTIL  HFA;VENTOLIN HFA) 108 (90 Base) MCG/ACT inhaler Inhale 1-2 puffs into the lungs every 4 (four) hours as needed for wheezing or shortness of breath.   PRN    Inpatient Medications:   Allergies:  Allergies  Allergen Reactions  . Azithromycin Hives    Social History   Social History  . Marital status: Married    Spouse name: N/A  . Number of children: N/A  . Years of education: N/A   Occupational History  . Not on file.   Social History Main Topics  . Smoking status: Never Smoker  . Smokeless tobacco: Never Used  . Alcohol use Yes  . Drug use: No  . Sexual activity: Not on file   Other Topics Concern  . Not on file   Social History Narrative  . No narrative on file     Family History  Problem Relation Age of Onset  . Stroke Mother   . Stroke Father      Review of Systems: All other systems reviewed and are otherwise negative except as noted above.  Physical Exam: Vitals:   06/11/17 1310  BP: (!) 162/124  Pulse: (!) 43  Resp: (!) 7  SpO2: 99%    GEN- The patient is a 76 yo and obese appearing, alert and oriented x 3 today.   HEENT: normocephalic, atraumatic; sclera clear, conjunctiva pink;  hearing intact; oropharynx clear; neck supple  Lungs- Clear to ausculation bilaterally, normal work of breathing.  No wheezes, rales, rhonchi Heart- Bradycardic regular rate and rhythm  GI- soft, non-tender, non-distended, bowel sounds present Extremities- no clubbing, cyanosis, or edema  MS- no significant deformity or atrophy Skin- warm and dry, no rash or lesion Psych- euthymic mood, full affect Neuro- strength and sensation are intact  Labs:   Lab Results  Component Value Date   WBC 11.9 (H) 06/11/2017   HGB 13.0 06/11/2017   HCT 38.9 (L) 06/11/2017   MCV 96.5 06/11/2017   PLT 129 (L) 06/11/2017     Recent Labs Lab 06/11/17 1001  NA 135  K 4.3  CL 103  CO2 21*  BUN 40*  CREATININE 2.05*  CALCIUM 8.8*  PROT 6.3*  BILITOT 1.5*  ALKPHOS 58  ALT  43  AST 42*  GLUCOSE 189*      Radiology/Studies: Dg Chest Portable 1 View  Result Date: 06/11/2017 CLINICAL DATA:  SOB, intermittent lightheadedness x 3 days. Pt denies chest pain. Hx - HTN, asthma, non-smoker. EXAM: PORTABLE CHEST 1 VIEW COMPARISON:  Chest x-ray dated 08/13/2014. FINDINGS: Heart size is upper normal, likely accentuated by patient positioning. Aortic atherosclerosis. Mild prominence of the interstitial markings bilaterally. Probable atelectasis and/or small effusion at the left lung base. No pneumothorax seen. Osseous structures about the chest are unremarkable. IMPRESSION: 1. Mildly prominent interstitial markings bilaterally suggesting mild interstitial edema, perhaps mild volume overload. No overt alveolar pulmonary edema. 2. Probable atelectasis and/or small effusion at the left lung base. 3. Aortic atherosclerosis. Electronically Signed   By: Franki Cabot M.D.   On: 06/11/2017 10:23    TLX:[id number] heart block, ventricular rate 26 (personally reviewed)  TELEMETRY: complete heart block  (personally reviewed)   Assessment/Plan: 1.  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  HTN Stable No change required today  3.  CKD Stable No change required today     Signed, Chanetta Marshall, NP 06/11/2017 1:49 PM  EP Attending  Patient seen and examined. Agree with above. He has CHB and is symptomatic with no reversible causes. I have discussed the indications/risks/benefits/goals/expectations of PPM insertion and he wishes to proceed.   Mikle Bosworth.D.

## 2017-06-11 NOTE — Consult Note (Signed)
Cardiology Consultation Note  Patient ID: Sean [name], MRN: [id number], DOB/AGE: [birth year] 76 y.o. Admit date: 06/11/2017   Date of Consult: 06/11/2017 Primary Physician: Patient, No Pcp Per Primary Cardiologist: New to [facility] - consult by Fletcher Anon, MD Requesting Physician: Dr. Mariea Clonts, MD  Chief Complaint: Fatigue/SOB/back pain Reason for Consult: 3rd degree AV block  HPI: Sean [name] is a 76 y.o. male who is being seen today for the evaluation of 3rd degree AV block at the request of Dr. Mariea Clonts, MD. Patient has a h/o CKD stage III, HTN, gout, and obesity who presented to [facility] ED with low back pain and was noted to be in 3rd degree AV block.  No prior known cardiac history. 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    Patient was in his usual state of health until 06/04/17 when he got back from a trip to [place]. He developed low back pain that was intermittent and improved with an OTC cream. This was followed by development of increased SOB and fatigue on 7/20 that persisted throughout the weekend. This morning when he got up he continued to note severe SOB and fatigue prompting him to come to the ED. Never with chest pain, LE swelling, erythema, warmth, or cording. He had been checking his BP over the weekend but would only get an "error message."  Upon the patient's arrival to [facility] they were found to have BP 136/50, HR 29 bpm, temp 98.6, oxygen saturation 90% on room air, weight 217 pounds. EKG showed 3rd degree AV block, 27 bpm, CXR showed mild interstitial edema, aortic atherosclerosis. Labs showed initial troponin 0.07, SCr 2.05 (baseline ~ 1.3-1.4), SCr 2.05, K+ 4.3, magnesium 2.1, t bili 1.5, AST 42, TSH pending. Echo pending. Patient's BP  has been stable in the 130s/80s and he has been mentating without issues. Never with chest pain. He is not on any rate limiting medications. Currently, with heart rate in the mid 20s bpm in 3rd degree AV block with BP 194 systolic. No chest pain.   Past Medical History:  Diagnosis Date  . Asthma   . CKD (chronic kidney disease), stage III   . Hypertension   . Obesity       Most Recent Cardiac Studies: As above   Surgical History:  Past Surgical History:  Procedure Laterality Date  . BACK SURGERY    . LEG SURGERY       Home Meds: Prior to Admission medications   Medication Sig Start Date End Date Taking? Authorizing Provider  allopurinol (ZYLOPRIM) 300 MG tablet Take 300 mg by mouth daily. 05/28/17   [provider]  amLODipine (NORVASC) 5 MG tablet Take 5 mg by mouth 2 (two) times daily. 04/09/17   [provider]    Inpatient Medications:     Allergies:  Allergies  Allergen Reactions  . Azithromycin Hives    Social History   Social History  . Marital status: Married    Spouse name: N/A  . Number of children: N/A  . Years of education: N/A   Occupational History  . Not on file.   Social History Main Topics  . Smoking status: Never Smoker  . Smokeless tobacco: Never Used  . Alcohol use Yes  . Drug  use: No  . Sexual activity: Not on file   Other Topics Concern  . Not on file   Social History Narrative  . No narrative on file     Family History  Problem Relation Age of Onset  . Stroke Mother   . Stroke Father      Review of Systems: Review of Systems  Constitutional: Positive for malaise/fatigue. Negative for chills, diaphoresis, fever and weight loss.  HENT: Negative for congestion.   Eyes: Negative for discharge and redness.  Respiratory: Positive for cough and shortness of breath. Negative for hemoptysis, sputum production and wheezing.   Cardiovascular: Negative for chest pain, palpitations, orthopnea, claudication, leg  swelling and PND.  Gastrointestinal: Negative for abdominal pain, blood in stool, heartburn, melena, nausea and vomiting.  Genitourinary: Negative for hematuria.  Musculoskeletal: Positive for back pain. Negative for falls and myalgias.  Skin: Negative for rash.  Neurological: Positive for weakness. Negative for dizziness, tingling, tremors, sensory change, speech change, focal weakness and loss of consciousness.  Endo/Heme/Allergies: Does not bruise/bleed easily.  Psychiatric/Behavioral: Negative for substance abuse. The patient is not nervous/anxious.   All other systems reviewed and are negative.   Labs:  Recent Labs  06/11/17 1001  TROPONINI 0.07*   Lab Results  Component Value Date   WBC 11.9 (H) 06/11/2017   HGB 13.0 06/11/2017   HCT 38.9 (L) 06/11/2017   MCV 96.5 06/11/2017   PLT 129 (L) 06/11/2017     Recent Labs Lab 06/11/17 1001  NA 135  K 4.3  CL 103  CO2 21*  BUN 40*  CREATININE 2.05*  CALCIUM 8.8*  PROT 6.3*  BILITOT 1.5*  ALKPHOS 58  ALT 43  AST 42*  GLUCOSE 189*   No results found for: CHOL, HDL, LDLCALC, TRIG No results found for: DDIMER  Radiology/Studies:  Dg Chest Portable 1 View  Result Date: 06/11/2017 IMPRESSION: 1. Mildly prominent interstitial markings bilaterally suggesting mild interstitial edema, perhaps mild volume overload. No overt alveolar pulmonary edema. 2. Probable atelectasis and/or small effusion at the left lung base. 3. Aortic atherosclerosis. Electronically Signed   By: Franki Cabot M.D.   On: 06/11/2017 10:23    EKG: Interpreted by me showed: 3rd degree AV block, 27 bpm, nonspecific st/t changes Telemetry: Interpreted by me showed: complete heart block, 20s bpm  Weights: Filed Weights   06/11/17 0953  Weight: 217 lb (98.4 kg)     Physical Exam: Blood pressure 140/89, pulse (!) 29, temperature 98.6 F (37 C), temperature source Oral, resp. rate 11, height 5\' 9"  (1.753 m), weight 217 lb (98.4 kg), SpO2 90 %. Body  mass index is 32.05 kg/m. General: Well developed, well nourished, in no acute distress. Head: Normocephalic, atraumatic, sclera non-icteric, no xanthomas, nares are without discharge.  Neck: Negative for carotid bruits. JVD not elevated. Lungs: Diminished breath sounds bilateral bases. Breathing is unlabored. Heart: Bradycardic with S1 S2. No murmurs, rubs, or gallops appreciated. Abdomen: Soft, non-tender, non-distended with normoactive bowel sounds. No hepatomegaly. No rebound/guarding. No obvious abdominal masses. Msk:  Strength and tone appear normal for age. Extremities: No clubbing or cyanosis. No edema. Distal pedal pulses are 2+ and equal bilaterally. Neuro: Alert and oriented X 3. No facial asymmetry. No focal deficit. Moves all extremities spontaneously. Psych:  Responds to questions appropriately with a normal affect.    Assessment and Plan:  Principal Problem:   Complete heart block (HCC) Active Problems:   Acute CHF (congestive heart failure) (HCC)   Acute kidney injury  superimposed on CKD (HCC)   Obesity   Gout    1. Complete heart block: -Prior episode of syncope in 07/2014 with EKG at that time showing bifascicular block -Possibly in complete heart block since 7/20 based on his symptoms -BP stable with normal mentation  -Pacer pads in place, no indication for transcutaneous pacing at this time -Would transcutaneous pace if becomes unstable -Discussed with EP, we will not place venous temporary wire prior to transfer to [facility] -Potassium ok at 4.3 -Magnesium ok at 2.1 -TSH pending -Check echo -Current EKG with trifascicular block  -Transfer to [facility] for EP evaluation and PPM implantation today  2. Acute on CKD stage III: -Some degree of volume overload on exam and CXR -Will hold off on IV fluids  3. Acute CHF: -Likely in the setting of #1 -Will likely improve with PPM -Check TTE as above -May need some IV diuresis    4. HTN: -BP stable -Hold  amlodipine  5. Gout: -Continue home allopurinol    Signed, Christell Faith, PA-C [place] Pager: [phone] 06/11/2017, 11:11 AM

## 2017-06-11 NOTE — Progress Notes (Signed)
I spoke with Sean [name] who says patient will be tx to [place] for 3dr degree heart block. D/c dr Mariea Clonts  I do not need to see patient/admit.

## 2017-06-11 NOTE — H&P (Signed)
History and Physical  Patient ID: Sean [name] MRN: [id number], DOB: [birth year] Admit Date: (Not on file) Date of Encounter: 06/11/2017, 11:12 AM Primary Physician: Patient, No Pcp Per Primary Cardiologist: New to [place]  Chief Complaint: SOB/fatigue/low back pain Reason for Admission: 3rd degree AV block  HPI: 76 y.o. male with h/o h/o CKD stage III, HTN, gout, and obesity who presented to [facility] ED with low back pain and was noted to be in 3rd degree AV block.  No prior known cardiac history. 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    Patient was in his usual state of health until 06/04/17 when he got back from a trip to [place]. He developed low back pain that was intermittent and improved with an OTC cream. This was followed by development of increased SOB and fatigue on 7/20 that persisted throughout the weekend. This morning when he got up he continued to note severe SOB and fatigue prompting him to come to the ED. Never with chest pain, LE swelling, erythema, warmth, or cording. He had been checking his BP over the weekend but would only get an "error message."  Upon the patient's arrival to [facility] they were found to have BP 136/50, HR 29 bpm, temp 98.6, oxygen saturation 90% on room air, weight 217 pounds. EKG showed 3rd degree AV block, 27 bpm, CXR showed mild interstitial edema, aortic atherosclerosis. Labs showed initial troponin 0.07, SCr 2.05 (baseline ~ 1.3-1.4), SCr 2.05, K+ 4.3, magnesium 2.1, t bili 1.5, AST 42, TSH pending. Echo pending. Patient's BP has been stable in the 130s/80s and he has been mentating without issues. Never with chest pain. He is not on any rate limiting medications. Currently, with heart rate in the  mid 20s bpm in 3rd degree AV block with BP 637 systolic. No chest pain.    Past Medical History:  Diagnosis Date  . Asthma   . CKD (chronic kidney disease), stage III   . Hypertension   . Obesity      Most Recent Cardiac Studies: Nuclear stress test 07/2014: Nuclear stress test without significant ischemia, apical thinning, no WMA, EF 59%, overall low risk.   Surgical History:  Past Surgical History:  Procedure Laterality Date  . BACK SURGERY    . LEG SURGERY       Home Meds: Prior to Admission medications   Medication Sig Start Date End Date Taking? Authorizing Provider  albuterol (PROVENTIL HFA;VENTOLIN HFA) 108 (90 Base) MCG/ACT inhaler Inhale 1-2 puffs into the lungs every 4 (four) hours as needed for wheezing or shortness of breath.    [provider]  allopurinol (ZYLOPRIM) 300 MG tablet Take 300 mg by mouth daily. 05/28/17   [provider]  amLODipine (NORVASC) 5 MG tablet Take 5 mg by mouth 2 (two) times daily. 04/09/17   [provider]  aspirin EC 81 MG tablet Take 81 mg by mouth daily.    [provider]  Fluticasone-Salmeterol (ADVAIR) 250-50 MCG/DOSE AEPB Inhale 1 puff into the lungs 2 (two) times daily.    [provider]  Multiple Vitamin (MULTIVITAMIN) tablet Take 1 tablet by mouth daily.    [provider]  NON FORMULARY Take 1 tablet by mouth daily. otc prostate multivitamin  [provider]    Allergies:  Allergies  Allergen Reactions  . Azithromycin Hives    Social History   Social History  . Marital status: Married    Spouse name: N/A  . Number of children: N/A  . Years of education: N/A   Occupational History  . Not on file.   Social History Main Topics  . Smoking status: Never Smoker  . Smokeless tobacco: Never Used  . Alcohol use Yes  . Drug use: No  . Sexual activity: Not on file   Other Topics Concern  . Not on file   Social History Narrative  . No narrative on file      Family History  Problem Relation Age of Onset  . Stroke Mother   . Stroke Father     Review of Systems: Review of Systems  Constitutional: Positive for malaise/fatigue. Negative for chills, diaphoresis, fever and weight loss.  HENT: Negative for congestion.   Eyes: Negative for discharge and redness.  Respiratory: Positive for shortness of breath. Negative for cough, hemoptysis, sputum production and wheezing.   Cardiovascular: Negative for chest pain, palpitations, orthopnea, claudication, leg swelling and PND.  Gastrointestinal: Negative for abdominal pain, blood in stool, heartburn, melena, nausea and vomiting.  Genitourinary: Negative for hematuria.  Musculoskeletal: Positive for back pain. Negative for falls and myalgias.  Skin: Negative for rash.  Neurological: Positive for weakness. Negative for dizziness, tingling, tremors, sensory change, speech change, focal weakness and loss of consciousness.  Endo/Heme/Allergies: Does not bruise/bleed easily.  Psychiatric/Behavioral: Negative for substance abuse. The patient is not nervous/anxious.   All other systems reviewed and are negative.   Labs:   Lab Results  Component Value Date   WBC 11.9 (H) 06/11/2017   HGB 13.0 06/11/2017   HCT 38.9 (L) 06/11/2017   MCV 96.5 06/11/2017   PLT 129 (L) 06/11/2017    Recent Labs Lab 06/11/17 1001  NA 135  K 4.3  CL 103  CO2 21*  BUN 40*  CREATININE 2.05*  CALCIUM 8.8*  PROT 6.3*  BILITOT 1.5*  ALKPHOS 58  ALT 43  AST 42*  GLUCOSE 189*    Recent Labs  06/11/17 1001  TROPONINI 0.07*   No results found for: CHOL, HDL, LDLCALC, TRIG No results found for: DDIMER  Radiology/Studies:  Dg Chest Portable 1 View  Result Date: 06/11/2017 IMPRESSION: 1. Mildly prominent interstitial markings bilaterally suggesting mild interstitial edema, perhaps mild volume overload. No overt alveolar pulmonary edema. 2. Probable atelectasis and/or small effusion at the left lung base. 3.  Aortic atherosclerosis. Electronically Signed   By: Franki Cabot M.D.   On: 06/11/2017 10:23     EKG: Interpreted by me showed: 3rd degree AV block, 27 bpm, nonspecific st/t changes Telemetry: Interpreted by me showed: complete heart block, 20s bpm  Weights: Filed Weights   06/11/17 0953  Weight: 217 lb (98.4 kg)     Physical Exam: Blood pressure 140/89, pulse (!) 29, temperature 98.6 F (37 C), temperature source Oral, resp. rate 11, height 5\' 9"  (1.753 m), weight 217 lb (98.4 kg), SpO2 90 %. Body mass index is 32.05 kg/m. General: Well developed, well nourished, in no acute distress. Head: Normocephalic, atraumatic, sclera non-icteric, no xanthomas, nares are without discharge.  Neck: Negative for carotid bruits. JVD not elevated. Lungs: Clear bilaterally to auscultation without wheezes, rales, or rhonchi. Breathing is unlabored. Heart: Bradycardic with S1 S2. No murmurs, rubs, or gallops appreciated. Abdomen: Soft, non-tender, non-distended with normoactive bowel sounds. No  hepatomegaly. No rebound/guarding. No obvious abdominal masses. Msk:  Strength and tone appear normal for age. Extremities: No clubbing or cyanosis. No edema. Distal pedal pulses are 2+ and equal bilaterally. Neuro: Alert and oriented X 3. No focal deficit. No facial asymmetry. Moves all extremities spontaneously. Psych:  Responds to questions appropriately with a normal affect.    ASSESSMENT AND PLAN:  Principal Problem:   Complete heart block (HCC) Active Problems:   Acute CHF (congestive heart failure) (HCC)   Acute kidney injury superimposed on CKD (HCC)   Obesity   Gout   1. Complete heart block: -Prior episode of syncope in 07/2014 with EKG at that time showing bifascicular block -Possibly in complete heart block since 7/20 based on his symptoms -BP stable with normal mentation  -Pacer pads in place, no indication for transcutaneous pacing at this time -Would transcutaneous pace if becomes  unstable -Discussed with EP, we will not place venous temporary wire prior to transfer to [facility] -Potassium ok at 4.3 -Magnesium ok at 2.1 -TSH pending -Check echo -Current EKG with trifascicular block  -Transfer to [facility] for EP evaluation and PPM implantation today  2. Acute on CKD stage III: -Some degree of volume overload on exam and CXR -Will hold off on IV fluids  3. Acute CHF: -Likely in the setting of #1 -Will likely improve with PPM -Check TTE as above -May need some IV diuresis    4. HTN: -BP stable -Hold amlodipine  5. Gout: -Continue home allopurinol   Signed, Christell Faith, PA-C [place] Pager: [phone] 06/11/2017, 11:12 AM

## 2017-06-11 NOTE — ED Notes (Signed)
[facility] transfer reviewed

## 2017-06-11 NOTE — ED Provider Notes (Signed)
[facility] Emergency Department Provider Note  ____________________________________________  Time seen: Approximately 10:10 AM  I have reviewed the triage vital signs and the nursing notes.   HISTORY  Chief Complaint Shortness of Breath; Back Pain; and Dizziness    HPI Sean [name] is a 76 y.o. male with a history of hypertension presenting with shortness of breath and lightheadedness. The patient reports that since Thursday or Friday, he has had exertional shortness of breath with significant decrease in his exercise tolerance. He also reports lightheadedness when he stands. He denies any chest pain, palpitations, syncope. No recent changes in medications. The patient also reports some low back pain similar to his previous chronic low back pain. He recently traveled by car to [place] earlier last week. On arrival to the emergency department, the patient has a heart rate of 28 and an EKG suggestive of complete heart block.   Past Medical History:  Diagnosis Date  . Asthma   . Hypertension     There are no active problems to display for this patient.   Past Surgical History:  Procedure Laterality Date  . BACK SURGERY    . LEG SURGERY      Current Outpatient Rx  . Order #: [id number] Class: Historical Med  . Order #: [id number] Class: Historical Med    Allergies Azithromycin  History reviewed. No pertinent family history.  Social History Social History  Substance Use Topics  . Smoking status: Never Smoker  . Smokeless tobacco: Never Used  . Alcohol use Yes    Review of Systems Constitutional: No fever/chills. + lightheadedness. Negative syncope. Eyes: No visual changes. No blurred or double vision. ENT: No sore throat. No congestion or rhinorrhea. Cardiovascular: Denies chest pain. Denies palpitations. Respiratory: Denies shortness of breath.  No cough. Gastrointestinal: No abdominal pain.  No nausea, no vomiting.  No diarrhea.  No  constipation. Genitourinary: Negative for dysuria. Musculoskeletal: Positive for back pain. Skin: Negative for rash. Neurological: Negative for headaches. No focal numbness, tingling or weakness.     ____________________________________________   PHYSICAL EXAM:  VITAL SIGNS: ED Triage Vitals  Enc Vitals Group     BP 06/11/17 0957 (!) 136/50     Pulse Rate 06/11/17 0957 (!) 29     Resp 06/11/17 0957 (!) 24     Temp 06/11/17 0957 98.6 F (37 C)     Temp Source 06/11/17 0957 Oral     SpO2 06/11/17 0957 90 %     Weight 06/11/17 0953 217 lb (98.4 kg)     Height 06/11/17 0953 5\' 9"  (1.753 m)     Head Circumference --      Peak Flow --      Pain Score 06/11/17 0952 5     Pain Loc --      Pain Edu? --      Excl. in [place]? --     Constitutional: Alert and oriented. Well appearing and in no acute distress. Answers questions appropriately.The patient is sitting comfortably in the stretcher without any acute distress. Eyes: Conjunctivae are normal.  EOMI. No scleral icterus. Head: Atraumatic. Nose: No congestion/rhinnorhea. Mouth/Throat: Mucous membranes are moist.  Neck: No stridor.  Supple.  No JVD. No meningismus. Cardiovascular: Slow rate, regular rhythm. No murmurs, rubs or gallops.  Respiratory: Normal respiratory effort.  No accessory muscle use or retractions. Lungs CTAB.  No wheezes, rales or ronchi. Gastrointestinal: Obese Soft, nontender and nondistended.  No guarding or rebound.  No peritoneal signs. Musculoskeletal: No LE  edema. No ttp in the calves or palpable cords.  Negative Homan's sign. Neurologic:  A&Ox3.  Speech is clear.  Face and smile are symmetric.  EOMI.  Moves all extremities well. Skin:  Skin is warm, dry and intact. No rash noted. Psychiatric: Mood and affect are normal. Speech and behavior are normal.  Normal judgement.  ____________________________________________   LABS (all labs ordered are listed, but only abnormal results are displayed)  Labs  Reviewed  CBC  TROPONIN I  MAGNESIUM  COMPREHENSIVE METABOLIC PANEL  PROTIME-INR  APTT  BRAIN NATRIURETIC PEPTIDE   ____________________________________________  EKG  ED ECG REPORT I, Eula Listen, the attending physician, personally viewed and interpreted this ECG.   Date: 06/11/2017  EKG Time: 956  Rate: 28  Rhythm: 3rd degree block  Axis: leftward  Intervals:none  ST&T Change: No STEMI  ____________________________________________  RADIOLOGY  No results found.  ____________________________________________   PROCEDURES  Procedure(s) performed: None  Procedures  Critical Care performed: Yes, see critical care note(s) ____________________________________________   INITIAL IMPRESSION / ASSESSMENT AND PLAN / ED COURSE  Pertinent labs & imaging results that were available during my care of the patient were reviewed by me and considered in my medical decision making (see chart for details).  76 y.o. male presenting for shortness of breath and decreased exercise tolerance. On arrival to the emergency department, the patient does have a normal blood pressure and mentation, but he has significant bradycardia with an EKG suggestive of third degree heart block. I have called the cardiologist on-call for consultation, the patient will be admitted to the hospital. In the meantime, he will receive intravenous fluids, and pacer pads have been placed on his chest. Given that he is overall hemodynamically stable at this time, I will get further recommendations from cardiology, rather than give him atropine which will only produce an intermittent effect.  CRITICAL CARE Performed by: Eula Listen   Total critical care time: 40 minutes  Critical care time was exclusive of separately billable procedures and treating other patients.  Critical care was necessary to treat or prevent imminent or life-threatening deterioration.  Critical care was time spent  personally by me on the following activities: development of treatment plan with patient and/or surrogate as well as nursing, discussions with consultants, evaluation of patient's response to treatment, examination of patient, obtaining history from patient or surrogate, ordering and performing treatments and interventions, ordering and review of laboratory studies, ordering and review of radiographic studies, pulse oximetry and re-evaluation of patient's condition.]   ____________________________________________  FINAL CLINICAL IMPRESSION(S) / ED DIAGNOSES  Final diagnoses:  Third degree heart block (HCC)  Shortness of breath  Lightheadedness         NEW MEDICATIONS STARTED DURING THIS VISIT:  New Prescriptions   No medications on file      Eula Listen, MD 06/11/17 1018

## 2017-06-11 NOTE — ED Notes (Signed)
Care assumed. Pt remains alert and oriented. Wife at bedside.  Pads on pt.  Remains with 2 patent IV.

## 2017-06-11 NOTE — Interval H&P Note (Signed)
History and Physical Interval Note:  06/11/2017 1:51 PM  Sean [name]  has presented today for surgery, with the diagnosis of bradycardia  The various methods of treatment have been discussed with the patient and family. After consideration of risks, benefits and other options for treatment, the patient has consented to  Procedure(s): Pacemaker Implant (N/A) as a surgical intervention .  The patient's history has been reviewed, patient examined, no change in status, stable for surgery.  I have reviewed the patient's chart and labs.  Questions were answered to the patient's satisfaction.     Sean [name]

## 2017-06-11 NOTE — ED Notes (Signed)
Left with [facility]link to [place]. Remains complete heart block in 20s.  Pads on pt.  Stable on leaving.

## 2017-06-11 NOTE — ED Notes (Signed)
Cardiologist to bedside at this time. 

## 2017-06-11 NOTE — ED Notes (Signed)
Report given to shannon with care link.

## 2017-06-12 ENCOUNTER — Encounter (HOSPITAL_COMMUNITY): Payer: Self-pay | Admitting: Internal Medicine

## 2017-06-12 ENCOUNTER — Ambulatory Visit (HOSPITAL_COMMUNITY): Payer: PPO

## 2017-06-12 DIAGNOSIS — N183 Chronic kidney disease, stage 3 (moderate): Secondary | ICD-10-CM | POA: Diagnosis not present

## 2017-06-12 DIAGNOSIS — I442 Atrioventricular block, complete: Secondary | ICD-10-CM | POA: Diagnosis not present

## 2017-06-12 DIAGNOSIS — Z7951 Long term (current) use of inhaled steroids: Secondary | ICD-10-CM | POA: Diagnosis not present

## 2017-06-12 DIAGNOSIS — R001 Bradycardia, unspecified: Secondary | ICD-10-CM | POA: Diagnosis not present

## 2017-06-12 DIAGNOSIS — M109 Gout, unspecified: Secondary | ICD-10-CM | POA: Diagnosis not present

## 2017-06-12 DIAGNOSIS — J45909 Unspecified asthma, uncomplicated: Secondary | ICD-10-CM | POA: Diagnosis not present

## 2017-06-12 DIAGNOSIS — J90 Pleural effusion, not elsewhere classified: Secondary | ICD-10-CM | POA: Diagnosis not present

## 2017-06-12 DIAGNOSIS — I129 Hypertensive chronic kidney disease with stage 1 through stage 4 chronic kidney disease, or unspecified chronic kidney disease: Secondary | ICD-10-CM | POA: Diagnosis not present

## 2017-06-12 DIAGNOSIS — E669 Obesity, unspecified: Secondary | ICD-10-CM | POA: Diagnosis not present

## 2017-06-12 DIAGNOSIS — Z6833 Body mass index (BMI) 33.0-33.9, adult: Secondary | ICD-10-CM | POA: Diagnosis not present

## 2017-06-12 DIAGNOSIS — Z7982 Long term (current) use of aspirin: Secondary | ICD-10-CM | POA: Diagnosis not present

## 2017-06-12 MED FILL — Cefazolin Sodium-Dextrose IV Solution 2 GM/100ML-4%: INTRAVENOUS | Qty: 100 | Status: AC

## 2017-06-12 NOTE — Progress Notes (Addendum)
Patient has received all discharge information and education. [employer] was also called when wife arrived and they went over the transmitter device that will be going home with him. Patient and wife have verbalized their understanding.

## 2017-06-12 NOTE — Discharge Summary (Signed)
ELECTROPHYSIOLOGY PROCEDURE DISCHARGE SUMMARY    Patient ID: Sean [name],  MRN: [id number], DOB/AGE: 21-Jan-1941 76 y.o.  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  CKD, stage III 2.  HTN 3.  Gout  Allergies  Allergen Reactions  . Azithromycin Hives     Procedures This Admission:  1.  Implantation of a MDT dual chamber PPM on 06/11/17 by Dr Lovena Le.  The patient received a MDT model number Azure PPM with model number 5076 right atrial lead and 3830 right ventricular lead. There were no immediate post procedure complications. 2.  CXR on 06/12/17 demonstrated no pneumothorax status post device implantation.   Brief HPI/Hospital Course Sean [name] is a 76 y.o. male presented to [facility] ER for evaluation of dizziness and was found to be in complete heart block. He was transferred to [facility] for pacemaker implantation. Risks, benefits, and alternatives to PPM implantation were reviewed with the patient who wished to proceed.  The patient underwent implantation of a MDT dual chamber PPM with details as outlined above.  He  was monitored on telemetry overnight which demonstrated sinus rhythm with V pacing.  Left chest was without hematoma or ecchymosis.  The device was interrogated and found to be functioning normally.  CXR was obtained and demonstrated no pneumothorax status post device implantation.  Wound care, arm mobility, and restrictions were reviewed with the patient.  The patient was examined and considered stable for discharge to home.    Physical Exam: Vitals:   06/11/17 1710 06/11/17 1740 06/11/17 1810 06/12/17 0554  BP: (!) 159/77 (!) 162/87 (!) 162/96 139/72  Pulse:    62  Resp: (!) 24 (!) 25 (!) 25   Temp:    98.1  F (36.7 C)  TempSrc:    Oral  SpO2:    91%  Weight:    225 lb 1.6 oz (102.1 kg)    GEN- The patient is well appearing, alert and oriented x 3 today.   HEENT: normocephalic, atraumatic; sclera clear, conjunctiva pink; hearing intact; oropharynx clear; neck supple  Lungs- Clear to ausculation bilaterally, normal work of breathing.  No wheezes, rales, rhonchi Heart- Regular rate and rhythm (paced) GI- soft, non-tender, non-distended, bowel sounds present  Extremities- no clubbing, cyanosis, or edema  MS- no significant deformity or atrophy Skin- warm and dry, no rash or lesion, left chest without hematoma/ecchymosis Psych- euthymic mood, full affect Neuro- strength and sensation are intact   Labs:   Lab Results  Component Value Date   WBC 11.9 (H) 06/11/2017   HGB 13.0 06/11/2017   HCT 38.9 (L) 06/11/2017   MCV 96.5 06/11/2017   PLT 129 (L) 06/11/2017     Recent Labs Lab 06/11/17 1001  NA 135  K 4.3  CL 103  CO2 21*  BUN 40*  CREATININE 2.05*  CALCIUM 8.8*  PROT 6.3*  BILITOT 1.5*  ALKPHOS 58  ALT 43  AST 42*  GLUCOSE 189*    Discharge Medications:  Allergies as of 06/12/2017      Reactions   Azithromycin Hives      Medication List    TAKE these medications   albuterol 108 (90 Base) MCG/ACT inhaler Commonly known as:  PROVENTIL HFA;VENTOLIN HFA Inhale 1-2 puffs into the lungs every 4 (four) hours as needed  for wheezing or shortness of breath.   allopurinol 300 MG tablet Commonly known as:  ZYLOPRIM Take 300 mg by mouth daily.   amLODipine 5 MG tablet Commonly known as:  NORVASC Take 5 mg by mouth 2 (two) times daily.   aspirin EC 81 MG tablet Take 81 mg by mouth daily.   Fluticasone-Salmeterol 250-50 MCG/DOSE Aepb Commonly known as:  ADVAIR Inhale 1 puff into the lungs 2 (two) times daily.   multivitamin tablet Take 1 tablet by mouth daily.   NON FORMULARY Take 1 tablet by mouth daily. otc prostate multivitamin       Disposition:    Discharge Instructions    Diet - low sodium heart healthy    Complete by:  As directed    Increase activity slowly    Complete by:  As directed      Follow-up Information    [facility] Follow up on 06/26/2017.   Specialty:  Cardiology Why:  at [place] for wound check  Contact information: [address], [address] [place]       Deboraha Sprang, MD Follow up on 09/11/2017.   Specialty:  Cardiology Why:  at 9:45AM Contact information: [place] [id number] [phone]           Duration of Discharge Encounter: Greater than 30 minutes including physician time.  Signed, Chanetta Marshall, NP 06/12/2017 8:12 AM  EP Attending  Patient seen and examined. Agree with above. The patient is doing well, s/p DDD PM insertion. [place] for DC home.   Mikle Bosworth.D.

## 2017-06-12 NOTE — Discharge Instructions (Signed)
° ° °  Supplemental Discharge Instructions for  Pacemaker/Defibrillator Patients  Activity No heavy lifting or vigorous activity with your left/right arm for 6 to 8 weeks.  Do not raise your left/right arm above your head for one week.  Gradually raise your affected arm as drawn below.           __         06/16/17                  06/17/17                    06/18/17                      06/19/17  NO DRIVING for 1 week    ; you may begin driving on  2/44/62   .  WOUND CARE - Keep the wound area clean and dry.  Do not get this area wet for one week. No showers for one week; you may shower on   06/19/17  . - The tape/steri-strips on your wound will fall off; do not pull them off.  No bandage is needed on the site.  DO  NOT apply any creams, oils, or ointments to the wound area. - If you notice any drainage or discharge from the wound, any swelling or bruising at the site, or you develop a fever > 101? F after you are discharged home, call the office at once.  Special Instructions - You are still able to use cellular telephones; use the ear opposite the side where you have your pacemaker/defibrillator.  Avoid carrying your cellular phone near your device. - When traveling through airports, show security personnel your identification card to avoid being screened in the metal detectors.  Ask the security personnel to use the hand wand. - Avoid arc welding equipment, MRI testing (magnetic resonance imaging), TENS units (transcutaneous nerve stimulators).  Call the office for questions about other devices. - Avoid electrical appliances that are in poor condition or are not properly grounded. - Microwave ovens are safe to be near or to operate.

## 2017-06-26 ENCOUNTER — Ambulatory Visit
Admission: RE | Admit: 2017-06-26 | Discharge: 2017-06-26 | Disposition: A | Discharge status: Home / Self Care | Payer: PPO | Source: Ambulatory Visit | Attending: Internal Medicine | Admitting: Internal Medicine

## 2017-06-26 ENCOUNTER — Ambulatory Visit (INDEPENDENT_AMBULATORY_CARE_PROVIDER_SITE_OTHER): Payer: PPO | Admitting: *Deleted

## 2017-06-26 DIAGNOSIS — Z950 Presence of cardiac pacemaker: Secondary | ICD-10-CM

## 2017-06-26 DIAGNOSIS — N183 Chronic kidney disease, stage 3 (moderate): Secondary | ICD-10-CM | POA: Diagnosis not present

## 2017-06-26 DIAGNOSIS — R938 Abnormal findings on diagnostic imaging of other specified body structures: Secondary | ICD-10-CM | POA: Insufficient documentation

## 2017-06-26 DIAGNOSIS — I493 Ventricular premature depolarization: Secondary | ICD-10-CM

## 2017-06-26 DIAGNOSIS — J439 Emphysema, unspecified: Secondary | ICD-10-CM | POA: Insufficient documentation

## 2017-06-26 DIAGNOSIS — I700 Atherosclerosis of aorta: Secondary | ICD-10-CM | POA: Diagnosis not present

## 2017-06-26 DIAGNOSIS — I442 Atrioventricular block, complete: Secondary | ICD-10-CM

## 2017-06-26 LAB — CUP PACEART INCLINIC DEVICE CHECK
Battery Remaining Longevity: 78 mo
Battery Voltage: 3.19 V
Brady Statistic AP VP Percent: 44.22 %
Brady Statistic AP VS Percent: 0.25 %
Brady Statistic AS VP Percent: 48.15 %
Brady Statistic AS VS Percent: 7.39 %
Brady Statistic RA Percent Paced: 48.6 %
Brady Statistic RV Percent Paced: 92.37 %
Date Time Interrogation Session: 20180807164714
Implantable Lead Implant Date: 20180723
Implantable Lead Implant Date: 20180723
Implantable Lead Location: 753859
Implantable Lead Location: 753860
Implantable Lead Model: 3830
Implantable Lead Model: 5076
Implantable Pulse Generator Implant Date: 20180723
Lead Channel Impedance Value: 304 Ohm
Lead Channel Impedance Value: 323 Ohm
Lead Channel Impedance Value: 418 Ohm
Lead Channel Impedance Value: 456 Ohm
Lead Channel Pacing Threshold Amplitude: 0.5 V
Lead Channel Pacing Threshold Amplitude: 0.75 V
Lead Channel Pacing Threshold Pulse Width: 0.4 ms
Lead Channel Pacing Threshold Pulse Width: 1 ms
Lead Channel Sensing Intrinsic Amplitude: 13 mV
Lead Channel Sensing Intrinsic Amplitude: 3.25 mV
Lead Channel Setting Pacing Amplitude: 3.5 V
Lead Channel Setting Pacing Amplitude: 3.5 V
Lead Channel Setting Pacing Pulse Width: 1 ms
Lead Channel Setting Sensing Sensitivity: 2 mV

## 2017-06-26 NOTE — Progress Notes (Signed)
Wound check appointment. Steri-strips removed. Wound without redness or edema. Incision edges approximated, wound well healed. Normal device function. Thresholds, sensing, and impedances consistent with implant measurements. RV (His) capture appears septal until LOC at 0.5V @ 1.59ms. Device programmed at 3.5V with auto capture programmed on (RA) for extra safety margin until 3 month visit; RV capture reprogrammed to monitor only, output fixed at 3.5V @ 1.21ms due to His lead. RA and RV lead monitor reprogrammed to adaptive, max impedances decreased to 2000ohms per clinic protocol. Histogram distribution appropriate for patient and level of activity. No mode switches or high ventricular rates noted. Patient educated about wound care, arm mobility, lifting restrictions. CXR ordered per verbal order from Dr. Caryl Comes to assess RV lead location due to bigeminal PVCs, possibly new since implant. Patient asymptomatic with PVCs. ROV with SK/B on 09/11/17.

## 2017-06-28 ENCOUNTER — Telehealth: Payer: Self-pay | Admitting: Internal Medicine

## 2017-06-28 ENCOUNTER — Telehealth: Payer: Self-pay

## 2017-06-28 NOTE — Telephone Encounter (Signed)
To [facility].  

## 2017-06-28 NOTE — Telephone Encounter (Signed)
Spoke w/ pt and informed him that [facility] RN has not reviewed Xray w/ MD yet b/c MD just got in the office a little while ago. Once the Xray is reviewed the RN will call him back. Pt verbalized understanding.

## 2017-06-28 NOTE — Telephone Encounter (Signed)
Reviewed Sean [name] with SK. Spoke with patient and explained that per SK he suspects that the loop of the HIS lead is resting on the floor of his heart muscle which could be contributing to the increase number of PVCs. I explained that SK has sent the image to a coworker for evaluation and that we will reach out to him as soon as we have additional recommendations. He verbalized understanding.

## 2017-06-28 NOTE — Telephone Encounter (Signed)
Spoke with patient and his wife and explained that Dr Aquilla Hacker recommendations were to have him send a remote transmission on 8/16 for Dr. Caryl Comes to review that afternoon and assess PVC burden. I explained that the first option was to see if his PVC burden decreased, and if not then we we would be looking at a lead revision. I walked patient through how to send a remote transmission and repeated this information to his wife. DC phone number given to patient. Patient and wife verbalized understanding of plan and are agreeable.

## 2017-06-28 NOTE — Telephone Encounter (Signed)
Pt would like a call regarding "xrays" on the wiring on his pace maker. Please call.

## 2017-07-03 ENCOUNTER — Telehealth: Payer: Self-pay | Admitting: *Deleted

## 2017-07-03 NOTE — Telephone Encounter (Signed)
Patient called asking about the results of his cxr. I explained to patient that Cyril Mourning reviewed the cxr with Dr.Klein last week and that nothing has changed since she spoke to him last week. I told patient that a decision about whether or not he will have a lead revision will not be made until after his PVC count is reassessed on Thursday. Patient verbalized understanding and appreciation of information.

## 2017-07-05 NOTE — Telephone Encounter (Signed)
Reviewed remote transmission with Dr. Caryl Comes.  Total V-pacing now 90.6%, down from 92.3% on 06/26/17.  Per Dr. Caryl Comes, patient will need RV lead repositioning.  He recommended that Dr. Lovena Le review further next week when he is back in the office.   Discussed with patient, advised of findings and recommendations from Dr. Caryl Comes.  Advised patient that I will review with Dr. Lovena Le on Tuesday and call him back with an updated plan.  Patient is appreciative and denies additional questions or concerns at this time.

## 2017-07-05 NOTE — Telephone Encounter (Signed)
F/u message  Pt calling to f/u on results. Please call back to discuss

## 2017-07-05 NOTE — Telephone Encounter (Signed)
Patient called asking if his transmission was received.  Advised that it was and that I will review it with Dr. Caryl Comes this afternoon when he is back in the office.  Patient is agreeable to this plan.

## 2017-07-10 NOTE — Telephone Encounter (Signed)
Reviewed transmission, PVC burden, and CXRs with Dr. Lovena Le.  Dr. Lovena Le recommended repeat remote transmission in mid-September to reassess PVC burden to determine if PVCs "settle down" as the RV/His lead heals.  At this point, he will determine if RV lead revision is necessary.  Patient continues to deny any symptoms with PVCs.  He plans to start walking on the treadmill or riding the stationary bike at the gym this week.  He is agreeable to this plan.  Patient is agreeable to remote transmission on 08/01/17.  Advised that we will call him to request a manual transmission if it is not received automatically.  Patient is appreciative and denies additional questions or concerns at this time.

## 2017-07-26 ENCOUNTER — Telehealth: Payer: Self-pay | Admitting: Cardiology

## 2017-07-26 NOTE — Telephone Encounter (Signed)
Patient called and wanted to know he was ok to lift more than 10 lbs. After consulting with [facility] RN I informed pt that he is ok to go back to his normal daily routine and has no restrictions at this time. Pt verbalized understanding.

## 2017-08-01 ENCOUNTER — Ambulatory Visit (INDEPENDENT_AMBULATORY_CARE_PROVIDER_SITE_OTHER): Payer: Self-pay | Admitting: *Deleted

## 2017-08-01 DIAGNOSIS — Z950 Presence of cardiac pacemaker: Secondary | ICD-10-CM

## 2017-08-01 NOTE — Progress Notes (Signed)
Remote pacemaker transmission.   

## 2017-08-02 LAB — CUP PACEART REMOTE DEVICE CHECK
Battery Remaining Longevity: 72 mo
Battery Voltage: 3.14 V
Brady Statistic AP VP Percent: 30.97 %
Brady Statistic AP VS Percent: 0.44 %
Brady Statistic AS VP Percent: 60.23 %
Brady Statistic AS VS Percent: 8.35 %
Brady Statistic RA Percent Paced: 34.64 %
Brady Statistic RV Percent Paced: 91.2 %
Date Time Interrogation Session: 20180912123939
Implantable Lead Implant Date: 20180723
Implantable Lead Implant Date: 20180723
Implantable Lead Location: 753859
Implantable Lead Location: 753860
Implantable Lead Model: 3830
Implantable Lead Model: 5076
Implantable Pulse Generator Implant Date: 20180723
Lead Channel Impedance Value: 285 Ohm
Lead Channel Impedance Value: 323 Ohm
Lead Channel Impedance Value: 399 Ohm
Lead Channel Impedance Value: 399 Ohm
Lead Channel Pacing Threshold Amplitude: 0.375 V
Lead Channel Pacing Threshold Amplitude: 0.875 V
Lead Channel Pacing Threshold Pulse Width: 0.4 ms
Lead Channel Pacing Threshold Pulse Width: 0.4 ms
Lead Channel Sensing Intrinsic Amplitude: 2.375 mV
Lead Channel Sensing Intrinsic Amplitude: 2.375 mV
Lead Channel Sensing Intrinsic Amplitude: 9.75 mV
Lead Channel Sensing Intrinsic Amplitude: 9.75 mV
Lead Channel Setting Pacing Amplitude: 3.5 V
Lead Channel Setting Pacing Amplitude: 3.5 V
Lead Channel Setting Pacing Pulse Width: 1 ms
Lead Channel Setting Sensing Sensitivity: 2 mV

## 2017-08-03 ENCOUNTER — Telehealth: Payer: Self-pay | Admitting: *Deleted

## 2017-08-03 ENCOUNTER — Encounter: Payer: Self-pay | Admitting: Cardiology

## 2017-08-03 NOTE — Telephone Encounter (Signed)
Spoke with patient, advised that transmission was received on 08/01/17.  Advised that I will review with Dr. Lovena Le next week when he is back in the office and patient is agreeable.  He denies additional questions or concerns at this time.

## 2017-08-06 NOTE — Telephone Encounter (Signed)
Total V-pacing 91.2% as of 08/01/17.  V-pacing was 90.6% on 8/16, 92.3% on 8/7.  Patient has been asymptomatic with PVCs.  Per last phone note from 8/14, plan was to reassess V-pacing/PVC burden mid-September to determine if RV/His lead revision discussion is necessary.    Routed to Dr. Lovena Le for recommendations.

## 2017-08-09 NOTE — Telephone Encounter (Signed)
When I saw the patient he was asymptomatic. Because he has not gotten any worse in terms of increased PVC burden, I would not do anything else. I am glad to see him in [place] or he can see SK in [place]. GT

## 2017-08-10 NOTE — Telephone Encounter (Signed)
Spoke with patient, advised him of Dr. Tanna Furry recommendations.  He is aware of his upcoming appointment with Dr. Caryl Comes on 09/11/17 at 9:45am.  Patient is appreciative and denies additional questions or concerns at this time.

## 2017-09-11 ENCOUNTER — Ambulatory Visit (INDEPENDENT_AMBULATORY_CARE_PROVIDER_SITE_OTHER): Payer: PPO | Admitting: Internal Medicine

## 2017-09-11 ENCOUNTER — Encounter: Payer: Self-pay | Admitting: Internal Medicine

## 2017-09-11 VITALS — BP 140/78 | HR 67 | Ht 69.5 in | Wt 221.8 lb

## 2017-09-11 DIAGNOSIS — I442 Atrioventricular block, complete: Secondary | ICD-10-CM | POA: Diagnosis not present

## 2017-09-11 DIAGNOSIS — I499 Cardiac arrhythmia, unspecified: Secondary | ICD-10-CM | POA: Diagnosis not present

## 2017-09-11 DIAGNOSIS — Z950 Presence of cardiac pacemaker: Secondary | ICD-10-CM

## 2017-09-11 LAB — CUP PACEART INCLINIC DEVICE CHECK
Battery Remaining Longevity: 98 mo
Battery Voltage: 3.08 V
Brady Statistic AP VP Percent: 34.61 %
Brady Statistic AP VS Percent: 0.24 %
Brady Statistic AS VP Percent: 59.85 %
Brady Statistic AS VS Percent: 5.3 %
Brady Statistic RA Percent Paced: 36.96 %
Brady Statistic RV Percent Paced: 94.46 %
Date Time Interrogation Session: 20181023144410
Implantable Lead Implant Date: 20180723
Implantable Lead Implant Date: 20180723
Implantable Lead Location: 753859
Implantable Lead Location: 753860
Implantable Lead Model: 3830
Implantable Lead Model: 5076
Implantable Pulse Generator Implant Date: 20180723
Lead Channel Impedance Value: 304 Ohm
Lead Channel Impedance Value: 323 Ohm
Lead Channel Impedance Value: 399 Ohm
Lead Channel Impedance Value: 418 Ohm
Lead Channel Pacing Threshold Amplitude: 0.5 V
Lead Channel Pacing Threshold Amplitude: 0.5 V
Lead Channel Pacing Threshold Pulse Width: 0.4 ms
Lead Channel Pacing Threshold Pulse Width: 1 ms
Lead Channel Sensing Intrinsic Amplitude: 10.875 mV
Lead Channel Sensing Intrinsic Amplitude: 3.125 mV
Lead Channel Setting Pacing Amplitude: 2 V
Lead Channel Setting Pacing Amplitude: 2.5 V
Lead Channel Setting Pacing Pulse Width: 1 ms
Lead Channel Setting Sensing Sensitivity: 2 mV

## 2017-09-11 NOTE — Progress Notes (Signed)
      Patient Care Team: [name], MD as PCP - General (Family Medicine)   HPI  Sean [name] is a 76 y.o. male Seen in followup for His pacing complicated lead induced PVCs initially at >9 % now reduced to greater than or equal to 5.5 %  No prior hx of EF assessment   No exercise intolerance. No shortness of breath chest pain or lightheadedness.  Dr. Lovena Le has weighed in  and thought that we should wait prior to revising the lead  Records and Results Reviewed   Past Medical History:  Diagnosis Date  . Asthma   . CHF (congestive heart failure) ([place])   . CKD (chronic kidney disease), stage III ([place])   . History of complete heart block   . Hypertension   . Obesity     Past Surgical History:  Procedure Laterality Date  . BACK SURGERY    . LEG SURGERY    . PACEMAKER IMPLANT N/A 06/11/2017   Procedure: Pacemaker Implant;  Surgeon: Evans Lance, MD;  Location: [facility];  Service: Cardiovascular;  Laterality: N/A;    Current Outpatient Prescriptions  Medication Sig Dispense Refill  . albuterol (PROVENTIL HFA;VENTOLIN HFA) 108 (90 Base) MCG/ACT inhaler Inhale 1-2 puffs into the lungs every 4 (four) hours as needed for wheezing or shortness of breath.    . allopurinol (ZYLOPRIM) 100 MG tablet Take 100 mg by mouth 2 (two) times daily.    Marland Kitchen amLODipine (NORVASC) 5 MG tablet Take 5 mg by mouth 2 (two) times daily.  1  . aspirin EC 81 MG tablet Take 81 mg by mouth daily.    . Fluticasone-Salmeterol (ADVAIR) 250-50 MCG/DOSE AEPB Inhale 1 puff into the lungs 2 (two) times daily.    . Multiple Vitamin (MULTIVITAMIN) tablet Take 1 tablet by mouth daily.    . NON FORMULARY Take 1 tablet by mouth daily. otc prostate multivitamin     No current facility-administered medications for this visit.     Allergies  Allergen Reactions  . Azithromycin Hives  . Erythromycin Hives      Review of Systems negative except from HPI and PMH  Physical Exam BP 140/78 (BP  Location: Left Arm, Patient Position: Sitting, Cuff Size: Normal)   Pulse 67   Ht 5' 9.5" (1.765 m)   Wt 221 lb 12 oz (100.6 kg)   BMI 32.28 kg/m  Well developed and nourished in no acute distress HENT normal Neck supple with JVP-flat Carotids brisk and full without bruits Clear Regular rate and rhythm, no murmurs or gallops Abd-soft with active BS without hepatomegaly No Clubbing cyanosis edema Skin-warm and dry A & Oriented  Grossly normal sensory and motor function  ECG personally reviewed sinus with P-synchronous/ AV  pacing    Assessment and  Plan   Complete heart block  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 There are 2 separate morphologies of fusion beats, PVC beats at a superior axis and the other fused beats have an inferior axis  We will obtain an echocardiogram to look for left ventricular function and use this to help direct intervention  Current medicines are reviewed at length with the patient today .  The patient does not have concerns regarding medicines.

## 2017-09-11 NOTE — Patient Instructions (Addendum)
Medication Instructions:  Your physician recommends that you continue on your current medications as directed. Please refer to the Current Medication list given to you today.   Labwork: none  Testing/Procedures: Your physician has requested that you have an echocardiogram. Echocardiography is a painless test that uses sound waves to create images of your heart. It provides your doctor with information about the size and shape of your heart and how well your heart's chambers and valves are working. This procedure takes approximately one hour. There are no restrictions for this procedure.  Remote monitoring is used to monitor your Pacemaker of ICD from home. This monitoring reduces the number of office visits required to check your device to one time per year. It allows [place] to keep an eye on the functioning of your device to ensure it is working properly. You are scheduled for a device check from home on October 31, 2017. You may send your transmission at any time that day. If you have a wireless device, the transmission will be sent automatically. After your physician reviews your transmission, you will receive a postcard with your next transmission date.    Follow-Up: Your physician wants you to follow-up in: July 2019 with Dr. Gari Crown will receive a reminder letter in the mail two months in advance. If you don't receive a letter, please call our office to schedule the follow-up appointment.   Any Other Special Instructions Will Be Listed Below (If Applicable).     If you need a refill on your cardiac medications before your next appointment, please call your pharmacy.  Echocardiogram An echocardiogram, or echocardiography, uses sound waves (ultrasound) to produce an image of your heart. The echocardiogram is simple, painless, obtained within a short period of time, and offers valuable information to your health care provider. The images from an echocardiogram can provide information such  as:  Evidence of coronary artery disease (CAD).  Heart size.  Heart muscle function.  Heart valve function.  Aneurysm detection.  Evidence of a past heart attack.  Fluid buildup around the heart.  Heart muscle thickening.  Assess heart valve function.  Tell a health care provider about:  Any allergies you have.  All medicines you are taking, including vitamins, herbs, eye drops, creams, and over-the-counter medicines.  Any problems you or family members have had with anesthetic medicines.  Any blood disorders you have.  Any surgeries you have had.  Any medical conditions you have.  Whether you are pregnant or may be pregnant. What happens before the procedure? No special preparation is needed. Eat and drink normally. What happens during the procedure?  In order to produce an image of your heart, gel will be applied to your chest and a wand-like tool (transducer) will be moved over your chest. The gel will help transmit the sound waves from the transducer. The sound waves will harmlessly bounce off your heart to allow the heart images to be captured in real-time motion. These images will then be recorded.  You may need an IV to receive a medicine that improves the quality of the pictures. What happens after the procedure? You may return to your normal schedule including diet, activities, and medicines, unless your health care provider tells you otherwise. This information is not intended to replace advice given to you by your health care provider. Make sure you discuss any questions you have with your health care provider. Document Released: 11/03/2000 Document Revised: 06/24/2016 Document Reviewed: 07/14/2013 Elsevier Interactive Patient Education  2017 Elsevier  Inc.  

## 2017-09-12 NOTE — Addendum Note (Signed)
Addended by: Janan Ridge on: 09/12/2017 09:41 AM   Modules accepted: Orders

## 2017-09-18 DIAGNOSIS — N183 Chronic kidney disease, stage 3 (moderate): Secondary | ICD-10-CM | POA: Diagnosis not present

## 2017-09-18 DIAGNOSIS — Z23 Encounter for immunization: Secondary | ICD-10-CM | POA: Diagnosis not present

## 2017-09-18 DIAGNOSIS — I10 Essential (primary) hypertension: Secondary | ICD-10-CM | POA: Diagnosis not present

## 2017-09-19 ENCOUNTER — Ambulatory Visit (INDEPENDENT_AMBULATORY_CARE_PROVIDER_SITE_OTHER): Payer: PPO

## 2017-09-19 ENCOUNTER — Other Ambulatory Visit: Payer: Self-pay

## 2017-09-19 DIAGNOSIS — I499 Cardiac arrhythmia, unspecified: Secondary | ICD-10-CM | POA: Diagnosis not present

## 2017-10-01 ENCOUNTER — Telehealth: Payer: Self-pay

## 2017-10-01 DIAGNOSIS — I499 Cardiac arrhythmia, unspecified: Secondary | ICD-10-CM

## 2017-10-01 NOTE — Telephone Encounter (Signed)
Called patient for echo results. Patient has a voicemail that has not been set up will attempt to call later.

## 2017-10-01 NOTE — Telephone Encounter (Signed)
Pt is aware and agreeable to echo results with repeat study in 6 months. I will place orders

## 2017-10-16 DIAGNOSIS — X32XXXA Exposure to sunlight, initial encounter: Secondary | ICD-10-CM | POA: Diagnosis not present

## 2017-10-16 DIAGNOSIS — L570 Actinic keratosis: Secondary | ICD-10-CM | POA: Diagnosis not present

## 2017-10-16 DIAGNOSIS — B079 Viral wart, unspecified: Secondary | ICD-10-CM | POA: Diagnosis not present

## 2017-10-16 DIAGNOSIS — D485 Neoplasm of uncertain behavior of skin: Secondary | ICD-10-CM | POA: Diagnosis not present

## 2017-10-16 DIAGNOSIS — D045 Carcinoma in situ of skin of trunk: Secondary | ICD-10-CM | POA: Diagnosis not present

## 2017-10-16 DIAGNOSIS — Z85828 Personal history of other malignant neoplasm of skin: Secondary | ICD-10-CM | POA: Diagnosis not present

## 2017-10-16 DIAGNOSIS — D3614 Benign neoplasm of peripheral nerves and autonomic nervous system of thorax: Secondary | ICD-10-CM | POA: Diagnosis not present

## 2017-10-16 DIAGNOSIS — Z08 Encounter for follow-up examination after completed treatment for malignant neoplasm: Secondary | ICD-10-CM | POA: Diagnosis not present

## 2017-10-25 DIAGNOSIS — I10 Essential (primary) hypertension: Secondary | ICD-10-CM | POA: Diagnosis not present

## 2017-10-31 ENCOUNTER — Ambulatory Visit (INDEPENDENT_AMBULATORY_CARE_PROVIDER_SITE_OTHER): Payer: PPO | Admitting: *Deleted

## 2017-10-31 DIAGNOSIS — I442 Atrioventricular block, complete: Secondary | ICD-10-CM | POA: Diagnosis not present

## 2017-10-31 NOTE — Progress Notes (Signed)
Remote pacemaker transmission.   

## 2017-11-02 LAB — CUP PACEART REMOTE DEVICE CHECK
Battery Remaining Longevity: 100 mo
Battery Voltage: 3.05 V
Brady Statistic AP VP Percent: 43.49 %
Brady Statistic AP VS Percent: 0.04 %
Brady Statistic AS VP Percent: 55.11 %
Brady Statistic AS VS Percent: 1.36 %
Brady Statistic RA Percent Paced: 44.1 %
Brady Statistic RV Percent Paced: 98.59 %
Date Time Interrogation Session: 20181212142936
Implantable Lead Implant Date: 20180723
Implantable Lead Implant Date: 20180723
Implantable Lead Location: 753859
Implantable Lead Location: 753860
Implantable Lead Model: 3830
Implantable Lead Model: 5076
Implantable Pulse Generator Implant Date: 20180723
Lead Channel Impedance Value: 304 Ohm
Lead Channel Impedance Value: 323 Ohm
Lead Channel Impedance Value: 418 Ohm
Lead Channel Impedance Value: 456 Ohm
Lead Channel Pacing Threshold Amplitude: 0.5 V
Lead Channel Pacing Threshold Amplitude: 0.875 V
Lead Channel Pacing Threshold Pulse Width: 0.4 ms
Lead Channel Pacing Threshold Pulse Width: 0.4 ms
Lead Channel Sensing Intrinsic Amplitude: 10.875 mV
Lead Channel Sensing Intrinsic Amplitude: 13.625 mV
Lead Channel Sensing Intrinsic Amplitude: 2.625 mV
Lead Channel Sensing Intrinsic Amplitude: 2.625 mV
Lead Channel Setting Pacing Amplitude: 2 V
Lead Channel Setting Pacing Amplitude: 2.5 V
Lead Channel Setting Pacing Pulse Width: 1 ms
Lead Channel Setting Sensing Sensitivity: 2 mV

## 2017-11-06 ENCOUNTER — Encounter: Payer: Self-pay | Admitting: Cardiology

## 2017-12-11 DIAGNOSIS — L905 Scar conditions and fibrosis of skin: Secondary | ICD-10-CM | POA: Diagnosis not present

## 2017-12-11 DIAGNOSIS — D045 Carcinoma in situ of skin of trunk: Secondary | ICD-10-CM | POA: Diagnosis not present

## 2018-01-07 DIAGNOSIS — J209 Acute bronchitis, unspecified: Secondary | ICD-10-CM | POA: Diagnosis not present

## 2018-01-07 DIAGNOSIS — R062 Wheezing: Secondary | ICD-10-CM | POA: Diagnosis not present

## 2018-01-30 ENCOUNTER — Ambulatory Visit (INDEPENDENT_AMBULATORY_CARE_PROVIDER_SITE_OTHER): Payer: PPO | Admitting: *Deleted

## 2018-01-30 DIAGNOSIS — I442 Atrioventricular block, complete: Secondary | ICD-10-CM | POA: Diagnosis not present

## 2018-01-30 NOTE — Progress Notes (Signed)
Remote pacemaker transmission.   

## 2018-01-31 ENCOUNTER — Encounter: Payer: Self-pay | Admitting: Cardiology

## 2018-02-07 LAB — CUP PACEART REMOTE DEVICE CHECK
Battery Remaining Longevity: 94 mo
Battery Voltage: 3.02 V
Brady Statistic AP VP Percent: 33.97 %
Brady Statistic AP VS Percent: 0 %
Brady Statistic AS VP Percent: 65.92 %
Brady Statistic AS VS Percent: 0.11 %
Brady Statistic RA Percent Paced: 33.96 %
Brady Statistic RV Percent Paced: 99.89 %
Date Time Interrogation Session: 20190313044436
Implantable Lead Implant Date: 20180723
Implantable Lead Implant Date: 20180723
Implantable Lead Location: 753859
Implantable Lead Location: 753860
Implantable Lead Model: 3830
Implantable Lead Model: 5076
Implantable Pulse Generator Implant Date: 20180723
Lead Channel Impedance Value: 304 Ohm
Lead Channel Impedance Value: 323 Ohm
Lead Channel Impedance Value: 380 Ohm
Lead Channel Impedance Value: 551 Ohm
Lead Channel Pacing Threshold Amplitude: 0.625 V
Lead Channel Pacing Threshold Amplitude: 0.75 V
Lead Channel Pacing Threshold Pulse Width: 0.4 ms
Lead Channel Pacing Threshold Pulse Width: 0.4 ms
Lead Channel Sensing Intrinsic Amplitude: 10.875 mV
Lead Channel Sensing Intrinsic Amplitude: 13.625 mV
Lead Channel Sensing Intrinsic Amplitude: 2.375 mV
Lead Channel Sensing Intrinsic Amplitude: 2.375 mV
Lead Channel Setting Pacing Amplitude: 2 V
Lead Channel Setting Pacing Amplitude: 2.5 V
Lead Channel Setting Pacing Pulse Width: 1 ms
Lead Channel Setting Sensing Sensitivity: 2 mV

## 2018-02-27 DIAGNOSIS — H43811 Vitreous degeneration, right eye: Secondary | ICD-10-CM | POA: Diagnosis not present

## 2018-02-27 DIAGNOSIS — H43391 Other vitreous opacities, right eye: Secondary | ICD-10-CM | POA: Diagnosis not present

## 2018-02-27 DIAGNOSIS — H35033 Hypertensive retinopathy, bilateral: Secondary | ICD-10-CM | POA: Diagnosis not present

## 2018-02-27 DIAGNOSIS — H26491 Other secondary cataract, right eye: Secondary | ICD-10-CM | POA: Diagnosis not present

## 2018-03-12 DIAGNOSIS — J302 Other seasonal allergic rhinitis: Secondary | ICD-10-CM | POA: Diagnosis not present

## 2018-03-12 DIAGNOSIS — J4521 Mild intermittent asthma with (acute) exacerbation: Secondary | ICD-10-CM | POA: Diagnosis not present

## 2018-04-04 ENCOUNTER — Other Ambulatory Visit: Payer: Self-pay

## 2018-04-04 ENCOUNTER — Ambulatory Visit (INDEPENDENT_AMBULATORY_CARE_PROVIDER_SITE_OTHER): Payer: PPO

## 2018-04-04 DIAGNOSIS — I499 Cardiac arrhythmia, unspecified: Secondary | ICD-10-CM | POA: Diagnosis not present

## 2018-04-09 ENCOUNTER — Telehealth: Payer: Self-pay | Admitting: Internal Medicine

## 2018-04-09 NOTE — Telephone Encounter (Signed)
Notes recorded by Emily Filbert, RN on 04/09/2018 at 5:04 PM EDT The patient is aware of his results. He states he would like to call me back to schedule a follow up with Dr. Caryl Comes as they are getting ready to go out of town. He will try to call back next week. ------  Notes recorded by Emily Filbert, RN on 04/09/2018 at 4:44 PM EDT I left a message for the patient to call at his cell #. No answer/ no voice mail at his home #. ------  Notes recorded by Deboraha Sprang, MD on 04/08/2018 at 4:34 PM EDT Please Inform Patient Echo showed Abnormal * heart muscle function at about 30-35%  With the extra beats we should review this in the office  thnx

## 2018-04-17 NOTE — Telephone Encounter (Addendum)
Patient returned call scheduled 6/4 with dr. Caryl Comes to discuss echo.  Please advise if patient needs to be seen sooner .

## 2018-04-18 NOTE — Telephone Encounter (Signed)
It looks like he's on for 04/30/18, but that's still ok!  Thanks!

## 2018-04-30 ENCOUNTER — Encounter: Payer: Self-pay | Admitting: Internal Medicine

## 2018-04-30 ENCOUNTER — Ambulatory Visit (INDEPENDENT_AMBULATORY_CARE_PROVIDER_SITE_OTHER): Payer: PPO | Admitting: Internal Medicine

## 2018-04-30 VITALS — BP 130/80 | HR 62 | Ht 69.0 in | Wt 218.5 lb

## 2018-04-30 DIAGNOSIS — I428 Other cardiomyopathies: Secondary | ICD-10-CM | POA: Diagnosis not present

## 2018-04-30 DIAGNOSIS — I493 Ventricular premature depolarization: Secondary | ICD-10-CM | POA: Diagnosis not present

## 2018-04-30 DIAGNOSIS — I442 Atrioventricular block, complete: Secondary | ICD-10-CM

## 2018-04-30 DIAGNOSIS — Z950 Presence of cardiac pacemaker: Secondary | ICD-10-CM | POA: Diagnosis not present

## 2018-04-30 MED ORDER — METOPROLOL SUCCINATE ER 25 MG PO TB24: 25.0000 mg | ORAL_TABLET | Freq: Every day | ORAL | 3 refills | Status: DC

## 2018-04-30 MED ORDER — LOSARTAN POTASSIUM 50 MG PO TABS: 50.0000 mg | ORAL_TABLET | Freq: Every day | ORAL | 3 refills | Status: DC

## 2018-04-30 NOTE — Patient Instructions (Addendum)
Medication Instructions: - Your physician has recommended you make the following change in your medication:  1) STOP norvasc (amlodipine) 2) START toprol (metoprolol succinate) 25 mg- take 1 tablet by mouth once daily 3) START cozaar (losartan) 50 mg- take 1 tablet by mouth once daily   Labwork: - Your physician recommends that you return for lab work in: 2 weeks- BMP   Procedures/Testing: - Your physician has recommended that you have a Cardiac CTA (you will receive a call from our [place] office to schedule this- [phone]  Please arrive at the [facility] main entrance of [facility] at________AM (30-45 minutes prior to test start time)  [facility] [place], [place] [address] [phone]  Proceed to the [facility] Radiology Department (First Floor).  Please follow these instructions carefully (unless otherwise directed):  Hold all erectile dysfunction medications at least 48 hours prior to test.  On the Night Before the Test: . Drink plenty of water. . Do not consume any caffeinated/decaffeinated beverages or chocolate 12 hours prior to your test. . Do not take any antihistamines 12 hours prior to your test.   On the Day of the Test: . Drink plenty of water. Do not drink any water within one hour of the test. . Do not eat any food 4 hours prior to the test. . You may take your regular medications prior to the test. . IF NOT ON A BETA BLOCKER - Take 50 mg of lopressor (metoprolol) one hour before the test- Take your regularly scheduled metoprolol the morning of your test   After the Test: . Drink plenty of water. . After receiving IV contrast, you may experience a mild flushed feeling. This is normal. . On occasion, you may experience a mild rash up to 24 hours after the test. This is not dangerous. If this occurs, you can take Benadryl 25 mg and increase your fluid intake. . If you experience trouble breathing, this can be  serious. If it is severe call 911 IMMEDIATELY. If it is mild, please call our office. . If you take any of these medications: Glipizide/Metformin, Avandament, Glucavance, please do not take 48 hours after completing test.   Follow-Up: - Your physician recommends that you schedule a follow-up appointment in: 8-10 weeks with Dr. Caryl Comes.   Any Additional Special Instructions Will Be Listed Below (If Applicable).     If you need a refill on your cardiac medications before your next appointment, please call your pharmacy.

## 2018-04-30 NOTE — Progress Notes (Signed)
Patient Care Team: [name], MD as PCP - General (Family Medicine)   HPI  Sean [name] is a 77 y.o. 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  His wife however thinks that his energy is notably less.  There is been no edema nocturnal dyspnea orthopnea.  No chest pain.   Records and Results Reviewed   Past Medical History:  Diagnosis Date  . Asthma   . CHF (congestive heart failure) ([place])   . CKD (chronic kidney disease), stage III ([place])   . History of complete heart block   . Hypertension   . Obesity     Past Surgical History:  Procedure Laterality Date  . BACK SURGERY    . LEG SURGERY    . PACEMAKER IMPLANT N/A 06/11/2017   Procedure: Pacemaker Implant;  Surgeon: Evans Lance, MD;  Location: [facility];  Service: Cardiovascular;  Laterality: N/A;    Current Outpatient Medications  Medication Sig Dispense Refill  . albuterol (PROVENTIL HFA;VENTOLIN HFA) 108 (90 Base) MCG/ACT inhaler Inhale 1-2 puffs into the lungs every 4 (four) hours as needed for wheezing or shortness of breath.    . allopurinol (ZYLOPRIM) 100 MG tablet Take 100 mg by mouth 2 (two) times daily.    Marland Kitchen amLODipine (NORVASC) 10 MG tablet Take 10 mg by mouth daily.    Marland Kitchen aspirin EC 81 MG tablet Take 81 mg by mouth daily.    . Multiple Vitamin (MULTIVITAMIN) tablet Take 1 tablet by mouth daily.    . NON FORMULARY Take 1 tablet by mouth daily. otc prostate multivitamin     No current facility-administered medications for this visit.     Allergies  Allergen Reactions  . Azithromycin Hives  . Erythromycin Hives      Review of Systems negative except from HPI and PMH  Physical Exam BP 130/80 (BP Location: Left Arm,  Patient Position: Sitting, Cuff Size: Normal)   Pulse 62   Ht 5\' 9"  (1.753 m)   Wt 218 lb 8 oz (99.1 kg)   BMI 32.27 kg/m  Well developed and nourished in no acute distress HENT normal Neck supple with JVP 6-7 Clear Device pocket well healed; without hematoma or erythema.  There is no tethering Regular rate and rhythm, no murmurs or gallops Abd-soft with active BS No Clubbing cyanosis edema Skin-warm and dry A & Oriented  Grossly normal sensory and motor function    ECG personally P synchronous pacing at 62 Intervals 18/16/47 Left bundle branch block pattern    Assessment and  Plan   Complete heart block  Cardiomyopathy question mechanism  PVCs-presumably lead related now quiescient  Pacemaker-his-[employer]  Hypertension  Blood pressure is well controlled.  PVC burden is almost nondetectable.  Unfortunately, he continues to have deteriorating LV function.  This raises the concern is the pacemaker cardiomyopathy.  Another explanation could be coronary disease.  He had a negative Myoview about 4 years ago; we will plan to undertake a CTA   We will also discontinue his amlodipine and put him on losartan as well as metoprolol, using  this as opposed to carvedilol because of his history of asthma.  Plan to regroup in about 8 to 10 weeks and make a decision at that point, particularly if the CT scan is unrevealing, to consider CRT upgrade  We spent more than 50% of our >25 min visit in face to face counseling regarding the above

## 2018-05-01 ENCOUNTER — Ambulatory Visit (INDEPENDENT_AMBULATORY_CARE_PROVIDER_SITE_OTHER): Payer: PPO | Admitting: *Deleted

## 2018-05-01 DIAGNOSIS — I442 Atrioventricular block, complete: Secondary | ICD-10-CM | POA: Diagnosis not present

## 2018-05-01 NOTE — Progress Notes (Signed)
Remote pacemaker transmission.   

## 2018-05-02 ENCOUNTER — Encounter: Payer: Self-pay | Admitting: Cardiology

## 2018-05-07 LAB — CUP PACEART REMOTE DEVICE CHECK
Battery Remaining Longevity: 97 mo
Battery Voltage: 3.01 V
Brady Statistic AP VP Percent: 76.22 %
Brady Statistic AP VS Percent: 0 %
Brady Statistic AS VP Percent: 23.69 %
Brady Statistic AS VS Percent: 0.09 %
Brady Statistic RA Percent Paced: 76.23 %
Brady Statistic RV Percent Paced: 99.91 %
Date Time Interrogation Session: 20190612133525
Implantable Lead Implant Date: 20180723
Implantable Lead Implant Date: 20180723
Implantable Lead Location: 753859
Implantable Lead Location: 753860
Implantable Lead Model: 3830
Implantable Lead Model: 5076
Implantable Pulse Generator Implant Date: 20180723
Lead Channel Impedance Value: 304 Ohm
Lead Channel Impedance Value: 323 Ohm
Lead Channel Impedance Value: 475 Ohm
Lead Channel Impedance Value: 494 Ohm
Lead Channel Pacing Threshold Amplitude: 0.625 V
Lead Channel Pacing Threshold Amplitude: 1.625 V
Lead Channel Pacing Threshold Pulse Width: 0.4 ms
Lead Channel Pacing Threshold Pulse Width: 0.4 ms
Lead Channel Sensing Intrinsic Amplitude: 10.875 mV
Lead Channel Sensing Intrinsic Amplitude: 13.625 mV
Lead Channel Sensing Intrinsic Amplitude: 2.5 mV
Lead Channel Sensing Intrinsic Amplitude: 2.5 mV
Lead Channel Setting Pacing Amplitude: 2 V
Lead Channel Setting Pacing Amplitude: 2.5 V
Lead Channel Setting Pacing Pulse Width: 1 ms
Lead Channel Setting Sensing Sensitivity: 2 mV

## 2018-05-09 ENCOUNTER — Telehealth: Payer: Self-pay | Admitting: Internal Medicine

## 2018-05-09 NOTE — Telephone Encounter (Signed)
Patient wants to know if he should have labs done on Tuesday since he has not had ct scan yet pending insurance auth    Please call

## 2018-05-09 NOTE — Telephone Encounter (Signed)
S/w patient. His CT scan is still pending insurance as he had called [facility] office to see about scheduling time frame. Dr Caryl Comes recently started patient on metoprolol and losartan with instructions for lab work in 2 week scheduled this Tuesday. Advised patient to keep appointment for lab work as scheduled and he verbalized understanding.

## 2018-05-13 LAB — CUP PACEART INCLINIC DEVICE CHECK
Battery Remaining Longevity: 97 mo
Battery Voltage: 3.01 V
Brady Statistic AP VP Percent: 37.03 %
Brady Statistic AP VS Percent: 0.01 %
Brady Statistic AS VP Percent: 62.58 %
Brady Statistic AS VS Percent: 0.38 %
Brady Statistic RA Percent Paced: 37.04 %
Brady Statistic RV Percent Paced: 99.61 %
Date Time Interrogation Session: 20190611134514
Implantable Lead Implant Date: 20180723
Implantable Lead Implant Date: 20180723
Implantable Lead Location: 753859
Implantable Lead Location: 753860
Implantable Lead Model: 3830
Implantable Lead Model: 5076
Implantable Pulse Generator Implant Date: 20180723
Lead Channel Impedance Value: 304 Ohm
Lead Channel Impedance Value: 323 Ohm
Lead Channel Impedance Value: 475 Ohm
Lead Channel Impedance Value: 494 Ohm
Lead Channel Pacing Threshold Amplitude: 0.5 V
Lead Channel Pacing Threshold Amplitude: 1 V
Lead Channel Pacing Threshold Pulse Width: 0.4 ms
Lead Channel Pacing Threshold Pulse Width: 0.4 ms
Lead Channel Sensing Intrinsic Amplitude: 13.625 mV
Lead Channel Sensing Intrinsic Amplitude: 2.4 mV
Lead Channel Setting Pacing Amplitude: 2 V
Lead Channel Setting Pacing Amplitude: 2.5 V
Lead Channel Setting Pacing Pulse Width: 1 ms
Lead Channel Setting Sensing Sensitivity: 2 mV

## 2018-05-14 ENCOUNTER — Other Ambulatory Visit: Payer: PPO

## 2018-05-14 DIAGNOSIS — I493 Ventricular premature depolarization: Secondary | ICD-10-CM | POA: Diagnosis not present

## 2018-05-15 LAB — BASIC METABOLIC PANEL
BUN/Creatinine Ratio: 17 (ref 10–24)
BUN: 21 mg/dL (ref 8–27)
CO2: 23 mmol/L (ref 20–29)
Calcium: 9 mg/dL (ref 8.6–10.2)
Chloride: 104 mmol/L (ref 96–106)
Creatinine, Ser: 1.23 mg/dL (ref 0.76–1.27)
GFR calc Af Amer: 66 mL/min/{1.73_m2} (ref >59)
GFR calc non Af Amer: 57 mL/min/{1.73_m2} — ABNORMAL LOW (ref >59)
Glucose: 136 mg/dL — ABNORMAL HIGH (ref 65–99)
Potassium: 3.9 mmol/L (ref 3.5–5.2)
Sodium: 142 mmol/L (ref 134–144)

## 2018-05-29 ENCOUNTER — Ambulatory Visit (HOSPITAL_COMMUNITY)
Admission: RE | Admit: 2018-05-29 | Discharge: 2018-05-29 | Disposition: A | Discharge status: Home / Self Care | Payer: PPO | Source: Ambulatory Visit | Attending: Internal Medicine | Admitting: Internal Medicine

## 2018-05-29 DIAGNOSIS — I700 Atherosclerosis of aorta: Secondary | ICD-10-CM | POA: Diagnosis not present

## 2018-05-29 DIAGNOSIS — I2510 Atherosclerotic heart disease of native coronary artery without angina pectoris: Secondary | ICD-10-CM | POA: Insufficient documentation

## 2018-05-29 DIAGNOSIS — I428 Other cardiomyopathies: Secondary | ICD-10-CM

## 2018-05-29 DIAGNOSIS — R079 Chest pain, unspecified: Secondary | ICD-10-CM | POA: Diagnosis not present

## 2018-05-29 MED ORDER — NITROGLYCERIN 0.4 MG SL SUBL
SUBLINGUAL_TABLET | SUBLINGUAL | Status: AC
  Administered 2018-05-29: 0.8 mg
  Filled 2018-05-29: qty 2

## 2018-05-29 MED ORDER — IOPAMIDOL (ISOVUE-370) INJECTION 76%
100.0000 mL | Freq: Once | INTRAVENOUS | Status: AC | PRN
  Administered 2018-05-29: 100 mL via INTRAVENOUS

## 2018-05-29 MED ORDER — IOPAMIDOL (ISOVUE-370) INJECTION 76%
INTRAVENOUS | Status: AC
  Filled 2018-05-29: qty 100

## 2018-06-04 ENCOUNTER — Telehealth: Payer: Self-pay | Admitting: Internal Medicine

## 2018-06-04 NOTE — Telephone Encounter (Signed)
Pt calling confused  He states another doctors office was to call him and schedule another procedure  He is calling for he is not sure who and what it was about would like a call back about this  Please call

## 2018-06-04 NOTE — Telephone Encounter (Signed)
S/w patient. We reviewed his CT results and it was recommended he have heart cath. Dr Caryl Comes had called patient with results. Patient would like to come in to speak in more detail about the procedure before scheduling it. Dr Fletcher Anon had an opening on 06/07/18 and patient agreeable to appointment.

## 2018-06-07 ENCOUNTER — Encounter: Payer: Self-pay | Admitting: Cardiovascular Disease

## 2018-06-07 ENCOUNTER — Ambulatory Visit (INDEPENDENT_AMBULATORY_CARE_PROVIDER_SITE_OTHER): Payer: PPO | Admitting: Cardiovascular Disease

## 2018-06-07 VITALS — BP 130/70 | HR 64 | Ht 69.5 in | Wt 217.5 lb

## 2018-06-07 DIAGNOSIS — E785 Hyperlipidemia, unspecified: Secondary | ICD-10-CM

## 2018-06-07 DIAGNOSIS — R0602 Shortness of breath: Secondary | ICD-10-CM

## 2018-06-07 DIAGNOSIS — Z01818 Encounter for other preprocedural examination: Secondary | ICD-10-CM | POA: Diagnosis not present

## 2018-06-07 DIAGNOSIS — I5022 Chronic systolic (congestive) heart failure: Secondary | ICD-10-CM | POA: Diagnosis not present

## 2018-06-07 DIAGNOSIS — I25118 Atherosclerotic heart disease of native coronary artery with other forms of angina pectoris: Secondary | ICD-10-CM

## 2018-06-07 MED ORDER — ATORVASTATIN CALCIUM 40 MG PO TABS: 40.0000 mg | ORAL_TABLET | Freq: Every day | ORAL | 3 refills | Status: DC

## 2018-06-07 NOTE — Progress Notes (Signed)
Cardiology Office Note   Date:  06/07/2018   ID:  Sean [name], DOB June 05, 1941, MRN [id number]  PCP:  [name], MD  Cardiologist: Dr. Caryl Comes  Chief Complaint  Patient presents with  . OTHER    F/u CT results. Meds reviewed verbally with pt.      History of Present Illness: Sean [name] is a 77 y.o. male who was referred by Dr. Caryl Comes for abnormal CTA of the coronary arteries. The patient has known history of pacemaker placement and PVCs.  Other medical problems include stage III chronic kidney disease, hypertension and obesity.  He had dual-chamber pacemaker placement last year in the setting of third-degree AV block. He had an echocardiogram done in May which showed an EF of 35 to 40% with diffuse hypokinesis, mild mitral regurgitation and mildly dilated left atrium.  Due to this cardiomyopathy, he underwent CTA of the coronary arteries which showed evidence of three-vessel coronary artery disease with a left dominant system.  FFR was less than 0.6 in all vessels. Surprisingly, the patient seems to have minimal symptoms.  He denies any chest pain.  He has mild exertional dyspnea and his biggest issue recently was dizziness and poor balance.  He does have hyperlipidemia but currently not on any medication.  He is not a smoker and drinks alcohol only occasionally.  Past Medical History:  Diagnosis Date  . Asthma   . CHF (congestive heart failure) ([place])   . CKD (chronic kidney disease), stage III ([place])   . History of complete heart block   . Hypertension   . Obesity     Past Surgical History:  Procedure Laterality Date  . BACK SURGERY    . LEG SURGERY    . PACEMAKER IMPLANT N/A 06/11/2017   Procedure: Pacemaker Implant;  Surgeon: Evans Lance, MD;  Location: [facility];  Service: Cardiovascular;  Laterality: N/A;     Current Outpatient Medications  Medication Sig Dispense Refill  . albuterol (PROVENTIL HFA;VENTOLIN HFA) 108 (90 Base) MCG/ACT inhaler Inhale  1-2 puffs into the lungs every 4 (four) hours as needed for wheezing or shortness of breath.    . allopurinol (ZYLOPRIM) 100 MG tablet Take 100 mg by mouth 2 (two) times daily.    Marland Kitchen aspirin EC 81 MG tablet Take 81 mg by mouth daily.    Marland Kitchen losartan (COZAAR) 50 MG tablet Take 1 tablet (50 mg total) by mouth daily. 30 tablet 3  . metoprolol succinate (TOPROL-XL) 25 MG 24 hr tablet Take 1 tablet (25 mg total) by mouth daily. 30 tablet 3  . Multiple Vitamin (MULTIVITAMIN) tablet Take 1 tablet by mouth daily.    . NON FORMULARY Take 1 tablet by mouth daily. otc prostate multivitamin     No current facility-administered medications for this visit.     Allergies:   Azithromycin and Erythromycin    Social History:  The patient  reports that he has never smoked. He has never used smokeless tobacco. He reports that he drinks alcohol. He reports that he does not use drugs.   Family History:  The patient's family history includes Stroke in his father and mother.    ROS:  Please see the history of present illness.   Otherwise, review of systems are positive for none.   All other systems are reviewed and negative.    PHYSICAL EXAM: VS:  BP 130/70 (BP Location: Left Arm, Patient Position: Sitting, Cuff Size: Normal)   Pulse 64  Ht 5' 9.5" (1.765 m)   Wt 217 lb 8 oz (98.7 kg)   BMI 31.66 kg/m  , BMI Body mass index is 31.66 kg/m. GEN: Well nourished, well developed, in no acute distress  HEENT: normal  Neck: no JVD, carotid bruits, or masses Cardiac: RRR; no murmurs, rubs, or gallops,no edema  Respiratory:  clear to auscultation bilaterally, normal work of breathing GI: soft, nontender, nondistended, + BS MS: no deformity or atrophy  Skin: warm and dry, no rash Neuro:  Strength and sensation are intact Psych: euthymic mood, full affect Radial pulses normal.  EKG:  EKG is ordered today. The ekg ordered today demonstrates AV sensed ventricular paced rhythm.   Recent Labs: 06/11/2017: ALT  43; B Natriuretic Peptide 627.0; Hemoglobin 13.0; Magnesium 2.1; Platelets 129; TSH 5.076 05/14/2018: BUN 21; Creatinine, Ser 1.23; Potassium 3.9; Sodium 142    Lipid Panel No results found for: CHOL, TRIG, HDL, CHOLHDL, VLDL, LDLCALC, LDLDIRECT    Wt Readings from Last 3 Encounters:  06/07/18 217 lb 8 oz (98.7 kg)  04/30/18 218 lb 8 oz (99.1 kg)  09/11/17 221 lb 12 oz (100.6 kg)      No flowsheet data found.    ASSESSMENT AND PLAN:  1.  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  Showed severe three-vessel coronary artery disease.  Surprisingly, the patient denies any chest pain and has only mild exertional dyspnea.  His CTA findings seem to be out of proportion to his symptoms.  Nonetheless, this is concerning especially with progressive cardiomyopathy. I recommend continuing aspirin 81 mg once daily and other cardiac medications. I discussed options with him and recommended proceeding with left heart catheterization and possible PCI.  I discussed the procedure in details as well as risks and benefits.  2.  Chronic systolic heart failure: The exact etiology of his cardiomyopathy is not entirely clear.  We have to rule out an ischemic etiology given the CTA findings.  It also could be due to RV pacing and the patient might ultimately require biventricular pacing.  Continue treatment with losartan and Toprol.  3.  Hyperlipidemia: The patient has known history of hyperlipidemia with most recent LDL above 120.  Given the findings on CTA, I added atorvastatin 40 mg daily.    Disposition:   FU with ne in 1 months  Signed,  Kathlyn Sacramento, MD  06/07/2018 11:00 AM    [place]

## 2018-06-07 NOTE — H&P (View-Only) (Signed)
Cardiology Office Note   Date:  06/07/2018   ID:  Sean [name], DOB 1941/07/29, MRN [id number]  PCP:  [name], MD  Cardiologist: Dr. Caryl Comes  Chief Complaint  Patient presents with  . OTHER    F/u CT results. Meds reviewed verbally with pt.      History of Present Illness: Sean [name] is a 77 y.o. male who was referred by Dr. Caryl Comes for abnormal CTA of the coronary arteries. The patient has known history of pacemaker placement and PVCs.  Other medical problems include stage III chronic kidney disease, hypertension and obesity.  He had dual-chamber pacemaker placement last year in the setting of third-degree AV block. He had an echocardiogram done in May which showed an EF of 35 to 40% with diffuse hypokinesis, mild mitral regurgitation and mildly dilated left atrium.  Due to this cardiomyopathy, he underwent CTA of the coronary arteries which showed evidence of three-vessel coronary artery disease with a left dominant system.  FFR was less than 0.6 in all vessels. Surprisingly, the patient seems to have minimal symptoms.  He denies any chest pain.  He has mild exertional dyspnea and his biggest issue recently was dizziness and poor balance.  He does have hyperlipidemia but currently not on any medication.  He is not a smoker and drinks alcohol only occasionally.  Past Medical History:  Diagnosis Date  . Asthma   . CHF (congestive heart failure) ([place])   . CKD (chronic kidney disease), stage III ([place])   . History of complete heart block   . Hypertension   . Obesity     Past Surgical History:  Procedure Laterality Date  . BACK SURGERY    . LEG SURGERY    . PACEMAKER IMPLANT N/A 06/11/2017   Procedure: Pacemaker Implant;  Surgeon: Evans Lance, MD;  Location: [facility];  Service: Cardiovascular;  Laterality: N/A;     Current Outpatient Medications  Medication Sig Dispense Refill  . albuterol (PROVENTIL HFA;VENTOLIN HFA) 108 (90 Base) MCG/ACT inhaler Inhale  1-2 puffs into the lungs every 4 (four) hours as needed for wheezing or shortness of breath.    . allopurinol (ZYLOPRIM) 100 MG tablet Take 100 mg by mouth 2 (two) times daily.    Marland Kitchen aspirin EC 81 MG tablet Take 81 mg by mouth daily.    Marland Kitchen losartan (COZAAR) 50 MG tablet Take 1 tablet (50 mg total) by mouth daily. 30 tablet 3  . metoprolol succinate (TOPROL-XL) 25 MG 24 hr tablet Take 1 tablet (25 mg total) by mouth daily. 30 tablet 3  . Multiple Vitamin (MULTIVITAMIN) tablet Take 1 tablet by mouth daily.    . NON FORMULARY Take 1 tablet by mouth daily. otc prostate multivitamin     No current facility-administered medications for this visit.     Allergies:   Azithromycin and Erythromycin    Social History:  The patient  reports that he has never smoked. He has never used smokeless tobacco. He reports that he drinks alcohol. He reports that he does not use drugs.   Family History:  The patient's family history includes Stroke in his father and mother.    ROS:  Please see the history of present illness.   Otherwise, review of systems are positive for none.   All other systems are reviewed and negative.    PHYSICAL EXAM: VS:  BP 130/70 (BP Location: Left Arm, Patient Position: Sitting, Cuff Size: Normal)   Pulse 64  Ht 5' 9.5" (1.765 m)   Wt 217 lb 8 oz (98.7 kg)   BMI 31.66 kg/m  , BMI Body mass index is 31.66 kg/m. GEN: Well nourished, well developed, in no acute distress  HEENT: normal  Neck: no JVD, carotid bruits, or masses Cardiac: RRR; no murmurs, rubs, or gallops,no edema  Respiratory:  clear to auscultation bilaterally, normal work of breathing GI: soft, nontender, nondistended, + BS MS: no deformity or atrophy  Skin: warm and dry, no rash Neuro:  Strength and sensation are intact Psych: euthymic mood, full affect Radial pulses normal.  EKG:  EKG is ordered today. The ekg ordered today demonstrates AV sensed ventricular paced rhythm.   Recent Labs: 06/11/2017: ALT  43; B Natriuretic Peptide 627.0; Hemoglobin 13.0; Magnesium 2.1; Platelets 129; TSH 5.076 05/14/2018: BUN 21; Creatinine, Ser 1.23; Potassium 3.9; Sodium 142    Lipid Panel No results found for: CHOL, TRIG, HDL, CHOLHDL, VLDL, LDLCALC, LDLDIRECT    Wt Readings from Last 3 Encounters:  06/07/18 217 lb 8 oz (98.7 kg)  04/30/18 218 lb 8 oz (99.1 kg)  09/11/17 221 lb 12 oz (100.6 kg)      No flowsheet data found.    ASSESSMENT AND PLAN:  1.  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  Showed severe three-vessel coronary artery disease.  Surprisingly, the patient denies any chest pain and has only mild exertional dyspnea.  His CTA findings seem to be out of proportion to his symptoms.  Nonetheless, this is concerning especially with progressive cardiomyopathy. I recommend continuing aspirin 81 mg once daily and other cardiac medications. I discussed options with him and recommended proceeding with left heart catheterization and possible PCI.  I discussed the procedure in details as well as risks and benefits.  2.  Chronic systolic heart failure: The exact etiology of his cardiomyopathy is not entirely clear.  We have to rule out an ischemic etiology given the CTA findings.  It also could be due to RV pacing and the patient might ultimately require biventricular pacing.  Continue treatment with losartan and Toprol.  3.  Hyperlipidemia: The patient has known history of hyperlipidemia with most recent LDL above 120.  Given the findings on CTA, I added atorvastatin 40 mg daily.    Disposition:   FU with ne in 1 months  Signed,  Kathlyn Sacramento, MD  06/07/2018 11:00 AM    [place]

## 2018-06-07 NOTE — Patient Instructions (Signed)
Medication Instructions: START Atorvastatin 40 mg tablet daily  If you need a refill on your cardiac medications before your next appointment, please call your pharmacy.   Follow-Up: Your physician wants you to follow-up in one month with Dr. Fletcher Anon.   Thank you for choosing Heartcare at [facility], [facility], [facility]!     [facility] [address] [place], [place]  [address] Phone:  [phone]   Fax:  [phone]  You are scheduled for a Cardiac Catheterization on Thursday, July 25 with Dr. Kathlyn Sacramento at 6:30 am (arrival 2 hours prior for hydration. Procedure to start at 8:30)  1. Arrive at the [facility] entrance.  Free valet service is available.  After entering the [facility] please check-in at the registration desk (1st desk on your right) to receive your armband. After receiving your armband someone will escort you to the cardiac cath/special procedures waiting area.  Special note: Every effort is made to have your procedure done on time. Please understand that emergencies sometimes delay scheduled procedures.  2. Diet: Do not eat or drink anything after midnight prior to your procedure except sips of water to take medications.   3. Labs: Will be done today at the [place] office  4. Medication instructions in preparation for your procedure: None to hold   On the morning of your procedure, take your Aspirin and any morning medicines NOT listed above.  You may use sips of water.  5. Plan for one night stay--bring personal belongings. 6. Bring a current list of your medications and current insurance cards. 7. You MUST have a responsible person to drive you home. 8. Someone MUST be with you the first 24 hours after you arrive home or your discharge will be delayed. 9. Please wear clothes that are easy to get on and off and wear slip-on shoes.  Thank you for allowing [place] to care for you!   -- [facility]

## 2018-06-08 LAB — CBC
Hematocrit: 42.9 % (ref 37.5–51.0)
Hemoglobin: 14.6 g/dL (ref 13.0–17.7)
MCH: 31.8 pg (ref 26.6–33.0)
MCHC: 34 g/dL (ref 31.5–35.7)
MCV: 94 fL (ref 79–97)
Platelets: 175 10*3/uL (ref 150–450)
RBC: 4.59 x10E6/uL (ref 4.14–5.80)
RDW: 14.4 % (ref 12.3–15.4)
WBC: 6.5 10*3/uL (ref 3.4–10.8)

## 2018-06-08 LAB — BASIC METABOLIC PANEL
BUN/Creatinine Ratio: 19 (ref 10–24)
BUN: 23 mg/dL (ref 8–27)
CO2: 27 mmol/L (ref 20–29)
Calcium: 9.6 mg/dL (ref 8.6–10.2)
Chloride: 101 mmol/L (ref 96–106)
Creatinine, Ser: 1.18 mg/dL (ref 0.76–1.27)
GFR calc Af Amer: 69 mL/min/{1.73_m2} (ref >59)
GFR calc non Af Amer: 60 mL/min/{1.73_m2} (ref >59)
Glucose: 125 mg/dL — ABNORMAL HIGH (ref 65–99)
Potassium: 4.5 mmol/L (ref 3.5–5.2)
Sodium: 142 mmol/L (ref 134–144)

## 2018-06-13 ENCOUNTER — Encounter
Admission: RE | Disposition: A | Discharge status: Home / Self Care | Payer: Self-pay | Source: Ambulatory Visit | Attending: Cardiovascular Disease

## 2018-06-13 ENCOUNTER — Encounter: Payer: Self-pay | Admitting: *Deleted

## 2018-06-13 ENCOUNTER — Ambulatory Visit
Admission: RE | Admit: 2018-06-13 | Discharge: 2018-06-13 | Disposition: A | Discharge status: Home / Self Care | Payer: PPO | Source: Ambulatory Visit | Attending: Cardiovascular Disease | Admitting: Cardiovascular Disease

## 2018-06-13 DIAGNOSIS — Z7982 Long term (current) use of aspirin: Secondary | ICD-10-CM | POA: Diagnosis not present

## 2018-06-13 DIAGNOSIS — N183 Chronic kidney disease, stage 3 (moderate): Secondary | ICD-10-CM | POA: Insufficient documentation

## 2018-06-13 DIAGNOSIS — Z950 Presence of cardiac pacemaker: Secondary | ICD-10-CM | POA: Insufficient documentation

## 2018-06-13 DIAGNOSIS — I130 Hypertensive heart and chronic kidney disease with heart failure and stage 1 through stage 4 chronic kidney disease, or unspecified chronic kidney disease: Secondary | ICD-10-CM | POA: Diagnosis not present

## 2018-06-13 DIAGNOSIS — I25118 Atherosclerotic heart disease of native coronary artery with other forms of angina pectoris: Secondary | ICD-10-CM

## 2018-06-13 DIAGNOSIS — I429 Cardiomyopathy, unspecified: Secondary | ICD-10-CM | POA: Insufficient documentation

## 2018-06-13 DIAGNOSIS — R0602 Shortness of breath: Secondary | ICD-10-CM | POA: Diagnosis present

## 2018-06-13 DIAGNOSIS — I2510 Atherosclerotic heart disease of native coronary artery without angina pectoris: Secondary | ICD-10-CM | POA: Diagnosis not present

## 2018-06-13 DIAGNOSIS — E785 Hyperlipidemia, unspecified: Secondary | ICD-10-CM | POA: Insufficient documentation

## 2018-06-13 DIAGNOSIS — Z9889 Other specified postprocedural states: Secondary | ICD-10-CM | POA: Insufficient documentation

## 2018-06-13 DIAGNOSIS — I5022 Chronic systolic (congestive) heart failure: Secondary | ICD-10-CM | POA: Insufficient documentation

## 2018-06-13 DIAGNOSIS — Z6831 Body mass index (BMI) 31.0-31.9, adult: Secondary | ICD-10-CM | POA: Insufficient documentation

## 2018-06-13 DIAGNOSIS — J45909 Unspecified asthma, uncomplicated: Secondary | ICD-10-CM | POA: Insufficient documentation

## 2018-06-13 DIAGNOSIS — E669 Obesity, unspecified: Secondary | ICD-10-CM | POA: Insufficient documentation

## 2018-06-13 DIAGNOSIS — I442 Atrioventricular block, complete: Secondary | ICD-10-CM | POA: Insufficient documentation

## 2018-06-13 DIAGNOSIS — Z881 Allergy status to other antibiotic agents status: Secondary | ICD-10-CM | POA: Diagnosis not present

## 2018-06-13 DIAGNOSIS — Z79899 Other long term (current) drug therapy: Secondary | ICD-10-CM | POA: Insufficient documentation

## 2018-06-13 DIAGNOSIS — Z823 Family history of stroke: Secondary | ICD-10-CM | POA: Insufficient documentation

## 2018-06-13 HISTORY — PX: LEFT HEART CATH AND CORONARY ANGIOGRAPHY: CATH118249

## 2018-06-13 SURGERY — LEFT HEART CATH AND CORONARY ANGIOGRAPHY
Anesthesia: Moderate Sedation

## 2018-06-13 MED ORDER — ONDANSETRON HCL 4 MG/2ML IJ SOLN: 4.0000 mg | Freq: Four times a day (QID) | INTRAMUSCULAR | Status: DC | PRN

## 2018-06-13 MED ORDER — SODIUM CHLORIDE 0.9 % IV SOLN: 250.0000 mL | INTRAVENOUS | Status: DC | PRN

## 2018-06-13 MED ORDER — SODIUM CHLORIDE 0.9% FLUSH: 3.0000 mL | Freq: Two times a day (BID) | INTRAVENOUS | Status: DC

## 2018-06-13 MED ORDER — MIDAZOLAM HCL 2 MG/2ML IJ SOLN
INTRAMUSCULAR | Status: AC
  Filled 2018-06-13: qty 2

## 2018-06-13 MED ORDER — FENTANYL CITRATE (PF) 100 MCG/2ML IJ SOLN
INTRAMUSCULAR | Status: DC | PRN
  Administered 2018-06-13: 25 ug via INTRAVENOUS

## 2018-06-13 MED ORDER — VERAPAMIL HCL 2.5 MG/ML IV SOLN
INTRAVENOUS | Status: AC
  Filled 2018-06-13: qty 2

## 2018-06-13 MED ORDER — HEPARIN SODIUM (PORCINE) 1000 UNIT/ML IJ SOLN
INTRAMUSCULAR | Status: AC
  Filled 2018-06-13: qty 1

## 2018-06-13 MED ORDER — VERAPAMIL HCL 2.5 MG/ML IV SOLN
INTRAVENOUS | Status: DC | PRN
  Administered 2018-06-13: 2.5 mg via INTRA_ARTERIAL

## 2018-06-13 MED ORDER — MIDAZOLAM HCL 2 MG/2ML IJ SOLN
INTRAMUSCULAR | Status: DC | PRN
  Administered 2018-06-13: 1 mg via INTRAVENOUS

## 2018-06-13 MED ORDER — SODIUM CHLORIDE 0.9% FLUSH: 3.0000 mL | INTRAVENOUS | Status: DC | PRN

## 2018-06-13 MED ORDER — IOPAMIDOL (ISOVUE-300) INJECTION 61%
INTRAVENOUS | Status: DC | PRN
  Administered 2018-06-13: 75 mL via INTRA_ARTERIAL

## 2018-06-13 MED ORDER — ASPIRIN 81 MG PO CHEW: 81.0000 mg | CHEWABLE_TABLET | ORAL | Status: DC

## 2018-06-13 MED ORDER — FENTANYL CITRATE (PF) 100 MCG/2ML IJ SOLN
INTRAMUSCULAR | Status: AC
  Filled 2018-06-13: qty 2

## 2018-06-13 MED ORDER — LIDOCAINE HCL (PF) 1 % IJ SOLN
INTRAMUSCULAR | Status: AC
  Filled 2018-06-13: qty 30

## 2018-06-13 MED ORDER — HEPARIN (PORCINE) IN NACL 1000-0.9 UT/500ML-% IV SOLN
INTRAVENOUS | Status: AC
  Filled 2018-06-13: qty 500

## 2018-06-13 MED ORDER — SODIUM CHLORIDE 0.9 % IV SOLN: INTRAVENOUS | Status: DC

## 2018-06-13 MED ORDER — HEPARIN SODIUM (PORCINE) 1000 UNIT/ML IJ SOLN
INTRAMUSCULAR | Status: DC | PRN
  Administered 2018-06-13: 5000 [IU] via INTRAVENOUS

## 2018-06-13 MED ORDER — ACETAMINOPHEN 325 MG PO TABS: 650.0000 mg | ORAL_TABLET | ORAL | Status: DC | PRN

## 2018-06-13 MED ORDER — SODIUM CHLORIDE 0.9 % IV SOLN
INTRAVENOUS | Status: DC
  Administered 2018-06-13: 08:00:00 via INTRAVENOUS

## 2018-06-13 MED ORDER — HEPARIN (PORCINE) IN NACL 1000-0.9 UT/500ML-% IV SOLN
INTRAVENOUS | Status: AC
  Filled 2018-06-13: qty 1000

## 2018-06-13 SURGICAL SUPPLY — 9 items
CATH INFINITI 5FR ANG PIGTAIL (CATHETERS) ×1 IMPLANT
CATH INFINITI 5FR JK (CATHETERS) ×1 IMPLANT
DEVICE RAD TR BAND REGULAR (VASCULAR PRODUCTS) ×1 IMPLANT
KIT MANI 3VAL PERCEP (MISCELLANEOUS) ×2 IMPLANT
NDL PERC 21GX4CM (NEEDLE) IMPLANT
NEEDLE PERC 21GX4CM (NEEDLE) ×2 IMPLANT
PACK CARDIAC CATH (CUSTOM PROCEDURE TRAY) ×2 IMPLANT
SHEATH RAIN RADIAL 21G 6FR (SHEATH) ×1 IMPLANT
WIRE ROSEN-J .035X260CM (WIRE) ×1 IMPLANT

## 2018-06-13 NOTE — Interval H&P Note (Signed)
Cath Lab Visit (complete for each Cath Lab visit)  Clinical Evaluation Leading to the Procedure:   ACS: No.  Non-ACS:    Anginal Classification: CCS II  Anti-ischemic medical therapy: Minimal Therapy (1 class of medications)  Non-Invasive Test Results: High-risk stress test findings: cardiac mortality >3%/year  Prior CABG: No previous CABG      History and Physical Interval Note:  06/13/2018 9:17 AM  Sean [name]  has presented today for surgery, with the diagnosis of LT Cath   Shortness of breath   Abnormal CTA PT ARRIVE 2HRS PRIOR FOR HYDRATION  The various methods of treatment have been discussed with the patient and family. After consideration of risks, benefits and other options for treatment, the patient has consented to  Procedure(s): LEFT HEART CATH AND CORONARY ANGIOGRAPHY (N/A) as a surgical intervention .  The patient's history has been reviewed, patient examined, no change in status, stable for surgery.  I have reviewed the patient's chart and labs.  Questions were answered to the patient's satisfaction.     Kathlyn Sacramento

## 2018-06-17 ENCOUNTER — Other Ambulatory Visit: Payer: Self-pay

## 2018-06-17 ENCOUNTER — Encounter: Payer: Self-pay | Admitting: Emergency Medicine

## 2018-06-17 ENCOUNTER — Emergency Department: Payer: PPO

## 2018-06-17 ENCOUNTER — Emergency Department
Admission: EM | Admit: 2018-06-17 | Discharge: 2018-06-17 | Disposition: A | Discharge status: Home / Self Care | Payer: PPO | Attending: Emergency Medicine | Admitting: Emergency Medicine

## 2018-06-17 ENCOUNTER — Telehealth: Payer: Self-pay | Admitting: Internal Medicine

## 2018-06-17 DIAGNOSIS — I130 Hypertensive heart and chronic kidney disease with heart failure and stage 1 through stage 4 chronic kidney disease, or unspecified chronic kidney disease: Secondary | ICD-10-CM | POA: Insufficient documentation

## 2018-06-17 DIAGNOSIS — I2510 Atherosclerotic heart disease of native coronary artery without angina pectoris: Secondary | ICD-10-CM | POA: Diagnosis not present

## 2018-06-17 DIAGNOSIS — N183 Chronic kidney disease, stage 3 (moderate): Secondary | ICD-10-CM | POA: Insufficient documentation

## 2018-06-17 DIAGNOSIS — I509 Heart failure, unspecified: Secondary | ICD-10-CM | POA: Diagnosis not present

## 2018-06-17 DIAGNOSIS — R600 Localized edema: Secondary | ICD-10-CM | POA: Diagnosis not present

## 2018-06-17 DIAGNOSIS — Z950 Presence of cardiac pacemaker: Secondary | ICD-10-CM | POA: Diagnosis not present

## 2018-06-17 DIAGNOSIS — I82B12 Acute embolism and thrombosis of left subclavian vein: Secondary | ICD-10-CM | POA: Diagnosis not present

## 2018-06-17 DIAGNOSIS — J984 Other disorders of lung: Secondary | ICD-10-CM | POA: Diagnosis not present

## 2018-06-17 DIAGNOSIS — M7989 Other specified soft tissue disorders: Secondary | ICD-10-CM | POA: Diagnosis present

## 2018-06-17 DIAGNOSIS — M9689 Other intraoperative and postprocedural complications and disorders of the musculoskeletal system: Secondary | ICD-10-CM | POA: Diagnosis not present

## 2018-06-17 DIAGNOSIS — I82629 Acute embolism and thrombosis of deep veins of unspecified upper extremity: Secondary | ICD-10-CM

## 2018-06-17 DIAGNOSIS — I82622 Acute embolism and thrombosis of deep veins of left upper extremity: Secondary | ICD-10-CM | POA: Diagnosis not present

## 2018-06-17 HISTORY — DX: Acute embolism and thrombosis of deep veins of unspecified upper extremity: I82.629

## 2018-06-17 LAB — CBC WITH DIFFERENTIAL/PLATELET
Basophils Absolute: 0.1 10*3/uL (ref 0–0.1)
Basophils Relative: 1 %
Eosinophils Absolute: 0.4 10*3/uL (ref 0–0.7)
Eosinophils Relative: 5 %
HCT: 41 % (ref 40.0–52.0)
Hemoglobin: 14.1 g/dL (ref 13.0–18.0)
Lymphocytes Relative: 19 %
Lymphs Abs: 1.6 10*3/uL (ref 1.0–3.6)
MCH: 33.3 pg (ref 26.0–34.0)
MCHC: 34.4 g/dL (ref 32.0–36.0)
MCV: 96.7 fL (ref 80.0–100.0)
Monocytes Absolute: 0.8 10*3/uL (ref 0.2–1.0)
Monocytes Relative: 9 %
Neutro Abs: 5.6 10*3/uL (ref 1.4–6.5)
Neutrophils Relative %: 66 %
Platelets: 156 10*3/uL (ref 150–440)
RBC: 4.24 MIL/uL — ABNORMAL LOW (ref 4.40–5.90)
RDW: 13.5 % (ref 11.5–14.5)
WBC: 8.5 10*3/uL (ref 3.8–10.6)

## 2018-06-17 LAB — COMPREHENSIVE METABOLIC PANEL
ALT: 16 U/L (ref 0–44)
AST: 23 U/L (ref 15–41)
Albumin: 4.4 g/dL (ref 3.5–5.0)
Alkaline Phosphatase: 41 U/L (ref 38–126)
Anion gap: 8 (ref 5–15)
BUN: 27 mg/dL — ABNORMAL HIGH (ref 8–23)
CO2: 29 mmol/L (ref 22–32)
Calcium: 9.2 mg/dL (ref 8.9–10.3)
Chloride: 104 mmol/L (ref 98–111)
Creatinine, Ser: 1.32 mg/dL — ABNORMAL HIGH (ref 0.61–1.24)
GFR calc Af Amer: 59 mL/min — ABNORMAL LOW (ref >60)
GFR calc non Af Amer: 51 mL/min — ABNORMAL LOW (ref >60)
Glucose, Bld: 124 mg/dL — ABNORMAL HIGH (ref 70–99)
Potassium: 4.7 mmol/L (ref 3.5–5.1)
Sodium: 141 mmol/L (ref 135–145)
Total Bilirubin: 0.8 mg/dL (ref 0.3–1.2)
Total Protein: 6.9 g/dL (ref 6.5–8.1)

## 2018-06-17 LAB — LACTIC ACID, PLASMA: Lactic Acid, Venous: 1.3 mmol/L (ref 0.5–1.9)

## 2018-06-17 LAB — URINALYSIS, COMPLETE (UACMP) WITH MICROSCOPIC
Bacteria, UA: NONE SEEN
Bilirubin Urine: NEGATIVE
Glucose, UA: NEGATIVE mg/dL
Hgb urine dipstick: NEGATIVE
Ketones, ur: NEGATIVE mg/dL
Leukocytes, UA: NEGATIVE
Nitrite: NEGATIVE
Protein, ur: NEGATIVE mg/dL
Specific Gravity, Urine: 1.018 (ref 1.005–1.030)
pH: 5 (ref 5.0–8.0)

## 2018-06-17 MED ORDER — APIXABAN 5 MG PO TABS
ORAL_TABLET | ORAL | Status: AC
  Filled 2018-06-17: qty 2

## 2018-06-17 MED ORDER — APIXABAN 5 MG PO TABS: ORAL_TABLET | ORAL | 0 refills | Status: DC

## 2018-06-17 MED ORDER — APIXABAN 5 MG PO TABS
10.0000 mg | ORAL_TABLET | Freq: Once | ORAL | Status: AC
  Administered 2018-06-17: 10 mg via ORAL
  Filled 2018-06-17: qty 2

## 2018-06-17 NOTE — ED Triage Notes (Signed)
Pt presents with left arm swelling since Friday. He had cardiac cath on Thursday and the swelling started the day after. Pt not sure if this is the arm where the IV was. Pt states he has "tingling" in his upper arm, but denies pain in his lower arm and had. Extremity is visibly swollen and red. Denies any pain.

## 2018-06-17 NOTE — Telephone Encounter (Signed)
I spoke with Kerin Ransom, PA regarding the patient's left arm/ hand. He advises that the patient be seen in the ER/ Urgent Care prior to starting antibiotics.  I have left a message on Mrs. Schill's identified voice mail advising her of this and to please call back with any further questions/ concerns.

## 2018-06-17 NOTE — ED Provider Notes (Signed)
[facility] Emergency Department Provider Note       Time seen: ----------------------------------------- 5:47 PM on 06/17/2018 -----------------------------------------   I have reviewed the triage vital signs and the nursing notes.  HISTORY   Chief Complaint Arm Swelling    HPI Sean [name] is a 77 y.o. male with a history of asthma, CHF, chronic kidney disease, hypertension, coronary artery disease who presents to the ED for left arm swelling.  Patient states his left arm has been swollen since Friday.  He had a cardiac catheterization with the accessed him via his right radial artery on Thursday.  He started swelling the day after and it has been persistently swollen since then.  He did have IVs in his left arm for the cath.  He complains of some tingling in his left arm but denies any significant pain.  Past Medical History:  Diagnosis Date  . Asthma   . CHF (congestive heart failure) ([place])   . CKD (chronic kidney disease), stage III ([place])   . History of complete heart block   . Hypertension   . Obesity     Patient Active Problem List   Diagnosis Date Noted  . Coronary artery disease   . Shortness of breath   . Complete heart block ([place]) 06/11/2017  . Acute kidney injury superimposed on CKD ([place]) 06/11/2017  . Obesity 06/11/2017  . Gout 06/11/2017  . Acute CHF (congestive heart failure) ([place]) 06/11/2017    Past Surgical History:  Procedure Laterality Date  . BACK SURGERY    . LEFT HEART CATH AND CORONARY ANGIOGRAPHY N/A 06/13/2018   Procedure: LEFT HEART CATH AND CORONARY ANGIOGRAPHY;  Surgeon: Wellington Hampshire, MD;  Location: [place] [facility];  Service: Cardiovascular;  Laterality: N/A;  . LEG SURGERY    . PACEMAKER IMPLANT N/A 06/11/2017   Procedure: Pacemaker Implant;  Surgeon: Evans Lance, MD;  Location: [place] [facility];  Service: Cardiovascular;  Laterality: N/A;    Allergies Azithromycin and Erythromycin  Social  History Social History   Tobacco Use  . Smoking status: Never Smoker  . Smokeless tobacco: Never Used  Substance Use Topics  . Alcohol use: Yes  . Drug use: No   Review of Systems Constitutional: Negative for fever. Cardiovascular: Negative for chest pain. Respiratory: Negative for shortness of breath. Gastrointestinal: Negative for abdominal pain, vomiting and diarrhea. Musculoskeletal: Positive for left arm swelling with tingling Skin: Negative for rash. Neurological: Negative for headaches, focal weakness or numbness.  All systems negative/normal/unremarkable except as stated in the HPI  ____________________________________________   PHYSICAL EXAM:  VITAL SIGNS: ED Triage Vitals  Enc Vitals Group     BP 06/17/18 1646 130/69     Pulse Rate 06/17/18 1646 81     Resp 06/17/18 1646 18     Temp 06/17/18 1646 98.3 F (36.8 C)     Temp Source 06/17/18 1646 Oral     SpO2 06/17/18 1646 97 %     Weight 06/17/18 1647 217 lb (98.4 kg)     Height 06/17/18 1647 5\' 9"  (1.753 m)     Head Circumference --      Peak Flow --      Pain Score 06/17/18 1652 5     Pain Loc --      Pain Edu? --      Excl. in [place]? --    Constitutional: Alert and oriented. Well appearing and in no distress. Eyes: Conjunctivae are normal. Normal extraocular movements.  Cardiovascular: Normal rate, regular rhythm. No murmurs, rubs, or gallops.  Normal pulses in the right and left arm Respiratory: Normal respiratory effort without tachypnea nor retractions. Breath sounds are clear and equal bilaterally. No wheezes/rales/rhonchi. Gastrointestinal: Soft and nontender. Normal bowel sounds Musculoskeletal: Left upper extremity edema compared to right.  Minimal erythema with some ecchymosis is noted Neurologic:  Normal speech and language. No gross focal neurologic deficits are appreciated.  Skin: Edema with minimal erythema in the left upper extremity Psychiatric: Mood and affect are normal. Speech and behavior  are normal.  ____________________________________________  EKG: Interpreted by me.  Atrial sensed ventricular paced rhythm with a rate of 64 bpm, normal pacemaker function is noted  ____________________________________________  ED COURSE:  As part of my medical decision making, I reviewed the following data within the [place] History obtained from family if available, nursing notes, old chart and ekg, as well as notes from prior ED visits. Patient presented for left upper extremity edema, we will assess with labs and imaging as indicated at this time.   Procedures ____________________________________________   LABS (pertinent positives/negatives)  Labs Reviewed  COMPREHENSIVE METABOLIC PANEL - Abnormal; Notable for the following components:      Result Value   Glucose, Bld 124 (*)    BUN 27 (*)    Creatinine, Ser 1.32 (*)    GFR calc non Af Amer 51 (*)    GFR calc Af Amer 59 (*)    All other components within normal limits  CBC WITH DIFFERENTIAL/PLATELET - Abnormal; Notable for the following components:   RBC 4.24 (*)    All other components within normal limits  URINALYSIS, COMPLETE (UACMP) WITH MICROSCOPIC - Abnormal; Notable for the following components:   Color, Urine YELLOW (*)    APPearance CLEAR (*)    All other components within normal limits  LACTIC ACID, PLASMA    RADIOLOGY  Left upper extremity ultrasound, chest x-ray IMPRESSION: Occlusive deep venous thrombosis in the left subclavian, axillary, and basilic veins. IMPRESSION: Bronchitic changes without infiltrate. ____________________________________________  DIFFERENTIAL DIAGNOSIS   DVT, peripheral edema, postprocedural complication  FINAL ASSESSMENT AND PLAN  Left upper extremity edema secondary to DVT   Plan: The patient had presented for left upper extremity edema. Patient's labs do not reveal any acute process. Patient's imaging did reveal an occlusive DVT in the left  subclavian, axillary and basilic veins.  His symptoms are very mild and he has no pain only paresthesias and swelling.  We did discuss with Dr. Delana Meyer from vascular surgery who recommended starting him on Eliquis.  He was given his first dose here.  This will need to be held for at least 3 days prior to open heart surgery but his follow-up CT surgery appointment is not for another 8 days.   Laurence Aly, MD   Note: This note was generated in part or whole with voice recognition software. Voice recognition is usually quite accurate but there are transcription errors that can and very often do occur. I apologize for any typographical errors that were not detected and corrected.     Earleen Newport, MD 06/17/18 [phone]

## 2018-06-17 NOTE — ED Notes (Signed)
Pt returned ambulatory from [place].

## 2018-06-17 NOTE — Telephone Encounter (Signed)
Call transferred directly to triage.   I spoke with the patient's wife.  The patient had a cath with Dr. Fletcher Anon on 06/13/18- right radial approach.   His wife reports today that the patient had an IV stick to the left hand. She thinks he may have had more than 1 IV in on the left side. Friday night/ Saturday morning, they noticed some swelling to the left hand.  Today she reports that his left arm is swollen from the elbow down, but the majority of the swelling is in the wrist and hand. She states his hand is VERY tight. It is red and warm to touch and uncomfortable for the patient.  The patient is reporting some slight discomfort up to the top of the arm.  She states they have tried to ice the hand but have noticed no improvement with this.  I advised the patient's wife that he may have some infection from the area of the IV stick and I will need to review with the provider covering the office this week to see if any antibiotic therapy may be warranted.   Allergies listed include:  Azithromycin & Erythromycin  She is aware I will call her back after reviewing with the MD/ APP.   The patient is currently awaiting a call from Dr. Ricard Dillon office for a consultation for CABG.

## 2018-06-17 NOTE — Telephone Encounter (Signed)
Pt wife calling stating patient had Cath done on 06/13/18  She's calling stating starting Saturday evening patient's left arm, elbow down to his hand is very swollen  She is calling for they tried to ice it down but it did not work  Please advise

## 2018-06-17 NOTE — ED Notes (Signed)
Pt in US

## 2018-06-17 NOTE — Telephone Encounter (Signed)
It appears he was seen in the emergency room Found he had a occlusive DVT in the left subclavian, axillary and basilic veins.  Started on anticoagulation

## 2018-06-17 NOTE — Telephone Encounter (Signed)
Patient spouse calling to see if we heard back from on call doctor  She states it is very swollen, asking if we just needs to take him to ER   Please advise

## 2018-06-25 ENCOUNTER — Institutional Professional Consult (permissible substitution): Payer: PPO | Admitting: Cardiothoracic Surgery

## 2018-06-25 ENCOUNTER — Encounter: Payer: Self-pay | Admitting: Cardiothoracic Surgery

## 2018-06-25 ENCOUNTER — Other Ambulatory Visit: Payer: Self-pay | Admitting: *Deleted

## 2018-06-25 VITALS — BP 109/68 | HR 62 | Resp 20 | Ht 69.0 in | Wt 217.0 lb

## 2018-06-25 DIAGNOSIS — R0602 Shortness of breath: Secondary | ICD-10-CM

## 2018-06-25 DIAGNOSIS — I2510 Atherosclerotic heart disease of native coronary artery without angina pectoris: Secondary | ICD-10-CM

## 2018-06-25 DIAGNOSIS — I82A12 Acute embolism and thrombosis of left axillary vein: Secondary | ICD-10-CM

## 2018-06-25 DIAGNOSIS — I82622 Acute embolism and thrombosis of deep veins of left upper extremity: Secondary | ICD-10-CM

## 2018-06-25 NOTE — Progress Notes (Signed)
PCP is [name], MD Referring Provider is Wellington Hampshire, MD  Chief Complaint  Patient presents with  . Coronary Artery Disease    Surgical eval, Cardiac Cath 06/13/2018, Heart CT 05/29/18, ECHO 04/04/18   Patient examined, images of coronary angiogram and 2D echo-cardiogram and cardiac CT scan personally reviewed and counseled with patient and wife. 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 one year ago the patient was diagnosed with complete heart block and underwent urgent transvenous pacemaker placement.  At that time his EF was 50-55%.  After follow-up echo showed reduction LV function he underwent a cardiac CT scan which showed evidence of coronary artery disease followed by cardiac catheterization.  The right coronary is small and diffusely diseased and probably not graftable.  The LAD diagonal and circumflex have high-grade stenoses 80 to 90% and appear to be adequate targets.  LVEDP is 20.  Patient is in sinus rhythm.  5 days ago just after cardiac catheterization via right radial artery the patient developed swelling ecchymoses and pain of his left hand and arm up to the axilla.  Ultrasound demonstrated thrombosis of the left subclavian vein and left axillary vein and left brachial vein.  He was started on Eliquis approximately 4 5 days ago.  The arm remains severely swollen and ecchymotic.  The patient has chronic lung disease from asthma and is very sensitive to flower scents  and most plants.  He uses inhalers as needed.  He has not had PFTs.  Past Medical History:  Diagnosis Date  . Asthma   . CHF (congestive heart failure) ([place])   . CKD (chronic kidney disease), stage III ([place])   . History of complete heart block   . Hypertension   . Obesity     Past Surgical History:  Procedure Laterality Date  . BACK SURGERY    . LEFT HEART CATH  AND CORONARY ANGIOGRAPHY N/A 06/13/2018   Procedure: LEFT HEART CATH AND CORONARY ANGIOGRAPHY;  Surgeon: Wellington Hampshire, MD;  Location: [place] [facility];  Service: Cardiovascular;  Laterality: N/A;  . LEG SURGERY    . PACEMAKER IMPLANT N/A 06/11/2017   Procedure: Pacemaker Implant;  Surgeon: Evans Lance, MD;  Location: [place] [facility];  Service: Cardiovascular;  Laterality: N/A;    Family History  Problem Relation Age of Onset  . Stroke Mother   . Stroke Father     Social History Social History   Tobacco Use  . Smoking status: Never Smoker  . Smokeless tobacco: Never Used  Substance Use Topics  . Alcohol use: Yes  . Drug use: No    Current Outpatient Medications  Medication Sig Dispense Refill  . albuterol (PROVENTIL HFA;VENTOLIN HFA) 108 (90 Base) MCG/ACT inhaler Inhale 1-2 puffs into the lungs every 4 (four) hours as needed for wheezing or shortness of breath.    . allopurinol (ZYLOPRIM) 100 MG tablet Take 100 mg by mouth 2 (two) times daily.    Marland Kitchen apixaban (ELIQUIS) 5 MG TABS tablet 10 mg (two tablets) twice daily for 7 days followed by 5 mg (one tablet) twice daily until follow up. 60 tablet 0  . aspirin EC 81 MG tablet Take 81 mg by mouth daily.    Marland Kitchen atorvastatin (LIPITOR) 40 MG tablet Take 1 tablet (40 mg total) by mouth daily. 90 tablet 3  . Fluticasone-Salmeterol (ADVAIR) 100-50 MCG/DOSE AEPB Inhale 2  puffs into the lungs daily as needed.     Marland Kitchen losartan (COZAAR) 50 MG tablet Take 1 tablet (50 mg total) by mouth daily. 30 tablet 3  . metoprolol succinate (TOPROL-XL) 25 MG 24 hr tablet Take 1 tablet (25 mg total) by mouth daily. 30 tablet 3  . Multiple Vitamin (MULTIVITAMIN) tablet Take 1 tablet by mouth daily.    . NON FORMULARY Take 1 tablet by mouth daily. otc prostate multivitamin     No current facility-administered medications for this visit.     Allergies  Allergen Reactions  . Azithromycin Hives  . Erythromycin Hives    Review of Systems                       Review of Systems :  [ y ] = yes, [  ] = no        General :  Weight gain [   ]    Weight loss  [   ]  Fatigue [  ]  Fever [  ]  Chills  [  ]                                          HEENT    Headache [  ]  Dizziness [  ]  Blurred vision [  ] Glaucoma  [  ]                          Nosebleeds [  ] Painful or loose teeth Blue.Reese  ]        Cardiac :  Chest pain/ pressure [  ]  Resting SOB [  ] exertional SOB [ y ]                        Orthopnea [  ]  Pedal edema  [  ]  Palpitations [  ] Syncope/presyncope [ ]                         Paroxysmal nocturnal dyspnea [  ]         Pulmonary : cough [ y ]  wheezing Blue.Reese  ]  Hemoptysis [  ] Sputum Blue.Reese  ] Snoring [  ]                              Pneumothorax [  ]  Sleep apnea [  ]        GI : Vomiting [  ]  Dysphagia [  ]  Melena  [  ]  Abdominal pain [  ] BRBPR [  ]              Heart burn [  ]  Constipation [  ] Diarrhea  [  ] Colonoscopy [   ]        GU : Hematuria [  ]  Dysuria [  ]  Nocturia [  ] UTI's [  ]        Vascular : Claudication [  ]  Rest pain [  ]  DVT [  ] Vein stripping [  ] leg ulcers [  ]  TIA [  ] Stroke [  ]  Varicose veins [  ]        NEURO :  Headaches  [  ] Seizures [  ] Vision changes [  ] Paresthesias [  ]                                               Musculoskeletal :  Arthritis [  ] Gout  [  ]  Back pain [  ]  Joint pain [  ] left arm swelling and pain from left subclavian DVT        Skin :  Rash [  ]  Melanoma [  ] Sores [  ]        Heme : Bleeding problems [  ]Clotting Disorders [  ] Anemia [  ]Blood Transfusion [ ]         Endocrine : Diabetes [  ] Heat or Cold intolerance [  ] Polyuria [  ]excessive thirst [ ]         Psych : Depression [  ]  Anxiety [  ]  Psych hospitalizations [  ] Memory change [  ]                                                                            BP 109/68   Pulse 62   Resp 20   Ht 5\' 9"  (1.753 m)   Wt 217 lb (98.4 kg)   SpO2 96%  Comment: RA  BMI 32.05 kg/m  Physical Exam     Physical Exam  General: Overweight but very well-developed 77 year old male HEENT: Normocephalic pupils equal , dentition adequate but with missing teeth Neck: Supple without JVD, adenopathy, or bruit Chest: Clear to auscultation, symmetrical breath sounds, no rhonchi, no tenderness             or deformity Cardiovascular: Regular rate and rhythm, no murmur, no gallop, peripheral pulses             palpable in all extremities Abdomen:  Soft, nontender, no palpable mass or organomegaly Extremities: Warm, well-perfused, no clubbing cyanosis edema or tenderness, minimal venous stasis changes of the legs but significant swelling and ecchymoses of the left arm up to the axilla               Rectal/GU: Deferred Neuro: Grossly non--focal and symmetrical throughout Skin: Clean and dry without rash or ulceration   Diagnostic Tests: Severe three-vessel coronary artery disease with moderate LV dysfunction DVT of left subclavian vein, left axillary vein Impression: Patient would benefit from multivessel CABG with bypass graft to the LAD diagonal and circumflex marginal.  First his DVT Of left arm needs to be treated and improved. Plan: Continue Eliquis.  Return in 3 weeks with repeat ultrasound of left arm DVT to evaluate extent of resolution of thrombus and CT scan of chest to rule out mass or pathology in the apex of the left lung not seen on the cardiac CT scan.   He will need to stop his Eliquis for 3 to  4 days prior to CABG.  Len Childs, MD [facility] [phone]

## 2018-06-26 ENCOUNTER — Other Ambulatory Visit: Payer: Self-pay

## 2018-06-26 ENCOUNTER — Observation Stay
Admission: EM | Admit: 2018-06-26 | Discharge: 2018-06-26 | Disposition: A | Discharge status: Home / Self Care | Payer: PPO | Attending: Internal Medicine | Admitting: Internal Medicine

## 2018-06-26 ENCOUNTER — Encounter: Payer: Self-pay | Admitting: Emergency Medicine

## 2018-06-26 ENCOUNTER — Emergency Department: Payer: PPO

## 2018-06-26 DIAGNOSIS — H8110 Benign paroxysmal vertigo, unspecified ear: Secondary | ICD-10-CM

## 2018-06-26 DIAGNOSIS — I429 Cardiomyopathy, unspecified: Secondary | ICD-10-CM | POA: Diagnosis not present

## 2018-06-26 DIAGNOSIS — I82A12 Acute embolism and thrombosis of left axillary vein: Secondary | ICD-10-CM

## 2018-06-26 DIAGNOSIS — Z6831 Body mass index (BMI) 31.0-31.9, adult: Secondary | ICD-10-CM | POA: Diagnosis not present

## 2018-06-26 DIAGNOSIS — E669 Obesity, unspecified: Secondary | ICD-10-CM | POA: Diagnosis not present

## 2018-06-26 DIAGNOSIS — I700 Atherosclerosis of aorta: Secondary | ICD-10-CM | POA: Insufficient documentation

## 2018-06-26 DIAGNOSIS — I82622 Acute embolism and thrombosis of deep veins of left upper extremity: Secondary | ICD-10-CM | POA: Diagnosis not present

## 2018-06-26 DIAGNOSIS — Z950 Presence of cardiac pacemaker: Secondary | ICD-10-CM | POA: Diagnosis not present

## 2018-06-26 DIAGNOSIS — Z7901 Long term (current) use of anticoagulants: Secondary | ICD-10-CM | POA: Insufficient documentation

## 2018-06-26 DIAGNOSIS — Z79899 Other long term (current) drug therapy: Secondary | ICD-10-CM | POA: Diagnosis not present

## 2018-06-26 DIAGNOSIS — N183 Chronic kidney disease, stage 3 (moderate): Secondary | ICD-10-CM | POA: Insufficient documentation

## 2018-06-26 DIAGNOSIS — J45909 Unspecified asthma, uncomplicated: Secondary | ICD-10-CM | POA: Diagnosis not present

## 2018-06-26 DIAGNOSIS — R079 Chest pain, unspecified: Secondary | ICD-10-CM | POA: Diagnosis not present

## 2018-06-26 DIAGNOSIS — I130 Hypertensive heart and chronic kidney disease with heart failure and stage 1 through stage 4 chronic kidney disease, or unspecified chronic kidney disease: Secondary | ICD-10-CM | POA: Insufficient documentation

## 2018-06-26 DIAGNOSIS — I442 Atrioventricular block, complete: Secondary | ICD-10-CM | POA: Diagnosis not present

## 2018-06-26 DIAGNOSIS — R42 Dizziness and giddiness: Secondary | ICD-10-CM | POA: Diagnosis not present

## 2018-06-26 DIAGNOSIS — Z7982 Long term (current) use of aspirin: Secondary | ICD-10-CM | POA: Insufficient documentation

## 2018-06-26 DIAGNOSIS — I2510 Atherosclerotic heart disease of native coronary artery without angina pectoris: Secondary | ICD-10-CM

## 2018-06-26 DIAGNOSIS — Z881 Allergy status to other antibiotic agents status: Secondary | ICD-10-CM | POA: Diagnosis not present

## 2018-06-26 DIAGNOSIS — R112 Nausea with vomiting, unspecified: Secondary | ICD-10-CM | POA: Diagnosis not present

## 2018-06-26 DIAGNOSIS — I5022 Chronic systolic (congestive) heart failure: Secondary | ICD-10-CM | POA: Diagnosis not present

## 2018-06-26 DIAGNOSIS — Z86718 Personal history of other venous thrombosis and embolism: Secondary | ICD-10-CM | POA: Insufficient documentation

## 2018-06-26 HISTORY — DX: Chronic systolic (congestive) heart failure: I50.22

## 2018-06-26 HISTORY — DX: Atherosclerotic heart disease of native coronary artery without angina pectoris: I25.10

## 2018-06-26 HISTORY — DX: Acute embolism and thrombosis of deep veins of unspecified upper extremity: I82.629

## 2018-06-26 HISTORY — DX: Ventricular premature depolarization: I49.3

## 2018-06-26 LAB — LIPID PANEL
Cholesterol: 116 mg/dL (ref 0–200)
HDL: 43 mg/dL (ref >40)
LDL Cholesterol: 60 mg/dL (ref 0–99)
Total CHOL/HDL Ratio: 2.7 RATIO
Triglycerides: 64 mg/dL (ref <150)
VLDL: 13 mg/dL (ref 0–40)

## 2018-06-26 LAB — PROTIME-INR
INR: 1.26
Prothrombin Time: 15.7 seconds — ABNORMAL HIGH (ref 11.4–15.2)

## 2018-06-26 LAB — HEPATIC FUNCTION PANEL
ALT: 16 U/L (ref 0–44)
AST: 25 U/L (ref 15–41)
Albumin: 4 g/dL (ref 3.5–5.0)
Alkaline Phosphatase: 38 U/L (ref 38–126)
Bilirubin, Direct: 0.2 mg/dL (ref 0.0–0.2)
Indirect Bilirubin: 1.1 mg/dL — ABNORMAL HIGH (ref 0.3–0.9)
Total Bilirubin: 1.3 mg/dL — ABNORMAL HIGH (ref 0.3–1.2)
Total Protein: 6.5 g/dL (ref 6.5–8.1)

## 2018-06-26 LAB — CBC
HCT: 40.1 % (ref 40.0–52.0)
Hemoglobin: 13.7 g/dL (ref 13.0–18.0)
MCH: 32.9 pg (ref 26.0–34.0)
MCHC: 34.3 g/dL (ref 32.0–36.0)
MCV: 96.1 fL (ref 80.0–100.0)
Platelets: 193 10*3/uL (ref 150–440)
RBC: 4.17 MIL/uL — ABNORMAL LOW (ref 4.40–5.90)
RDW: 13.4 % (ref 11.5–14.5)
WBC: 9.8 10*3/uL (ref 3.8–10.6)

## 2018-06-26 LAB — BASIC METABOLIC PANEL
Anion gap: 6 (ref 5–15)
BUN: 29 mg/dL — ABNORMAL HIGH (ref 8–23)
CO2: 31 mmol/L (ref 22–32)
Calcium: 9 mg/dL (ref 8.9–10.3)
Chloride: 104 mmol/L (ref 98–111)
Creatinine, Ser: 1.36 mg/dL — ABNORMAL HIGH (ref 0.61–1.24)
GFR calc Af Amer: 57 mL/min — ABNORMAL LOW (ref >60)
GFR calc non Af Amer: 49 mL/min — ABNORMAL LOW (ref >60)
Glucose, Bld: 142 mg/dL — ABNORMAL HIGH (ref 70–99)
Potassium: 4.1 mmol/L (ref 3.5–5.1)
Sodium: 141 mmol/L (ref 135–145)

## 2018-06-26 LAB — HEMOGLOBIN A1C
Hgb A1c MFr Bld: 6 % — ABNORMAL HIGH (ref 4.8–5.6)
Mean Plasma Glucose: 125.5 mg/dL

## 2018-06-26 LAB — TROPONIN I
Troponin I: <0.03 ng/mL (ref <0.03)
Troponin I: <0.03 ng/mL (ref <0.03)
Troponin I: <0.03 ng/mL (ref <0.03)

## 2018-06-26 LAB — HEPARIN LEVEL (UNFRACTIONATED): Heparin Unfractionated: 2.24 IU/mL — ABNORMAL HIGH (ref 0.30–0.70)

## 2018-06-26 LAB — APTT: aPTT: 33 seconds (ref 24–36)

## 2018-06-26 LAB — MAGNESIUM: Magnesium: 2.1 mg/dL (ref 1.7–2.4)

## 2018-06-26 LAB — TSH: TSH: 3.141 u[IU]/mL (ref 0.350–4.500)

## 2018-06-26 MED ORDER — LOSARTAN POTASSIUM 50 MG PO TABS
50.0000 mg | ORAL_TABLET | Freq: Every day | ORAL | Status: DC
  Administered 2018-06-26: 50 mg via ORAL
  Filled 2018-06-26: qty 1

## 2018-06-26 MED ORDER — MECLIZINE HCL 25 MG PO TABS
25.0000 mg | ORAL_TABLET | Freq: Three times a day (TID) | ORAL | Status: DC | PRN
  Filled 2018-06-26: qty 2

## 2018-06-26 MED ORDER — ATORVASTATIN CALCIUM 20 MG PO TABS
40.0000 mg | ORAL_TABLET | Freq: Every day | ORAL | Status: DC
  Administered 2018-06-26: 40 mg via ORAL
  Filled 2018-06-26: qty 2

## 2018-06-26 MED ORDER — ALLOPURINOL 100 MG PO TABS
100.0000 mg | ORAL_TABLET | Freq: Two times a day (BID) | ORAL | Status: DC
  Administered 2018-06-26: 100 mg via ORAL
  Filled 2018-06-26 (×2): qty 1

## 2018-06-26 MED ORDER — ADULT MULTIVITAMIN W/MINERALS CH
1.0000 | ORAL_TABLET | Freq: Every day | ORAL | Status: DC
  Administered 2018-06-26: 1 via ORAL
  Filled 2018-06-26: qty 1

## 2018-06-26 MED ORDER — ALBUTEROL SULFATE (2.5 MG/3ML) 0.083% IN NEBU: 2.5000 mg | INHALATION_SOLUTION | Freq: Four times a day (QID) | RESPIRATORY_TRACT | Status: DC | PRN

## 2018-06-26 MED ORDER — ONDANSETRON HCL 4 MG/2ML IJ SOLN
4.0000 mg | Freq: Four times a day (QID) | INTRAMUSCULAR | Status: DC | PRN
Start: 2018-06-26 — End: 2018-06-26
  Administered 2018-06-26: 4 mg via INTRAVENOUS
  Filled 2018-06-26: qty 2

## 2018-06-26 MED ORDER — ONDANSETRON HCL 4 MG/2ML IJ SOLN
4.0000 mg | Freq: Once | INTRAMUSCULAR | Status: AC
  Administered 2018-06-26: 4 mg via INTRAVENOUS

## 2018-06-26 MED ORDER — HEPARIN (PORCINE) IN NACL 100-0.45 UNIT/ML-% IJ SOLN
1500.0000 [IU]/h | INTRAMUSCULAR | Status: DC
  Administered 2018-06-26: 1500 [IU]/h via INTRAVENOUS
  Filled 2018-06-26: qty 250

## 2018-06-26 MED ORDER — MORPHINE SULFATE (PF) 2 MG/ML IV SOLN: 2.0000 mg | INTRAVENOUS | Status: DC | PRN

## 2018-06-26 MED ORDER — NITROGLYCERIN 0.4 MG SL SUBL: 0.4000 mg | SUBLINGUAL_TABLET | SUBLINGUAL | Status: DC | PRN

## 2018-06-26 MED ORDER — METOPROLOL SUCCINATE ER 50 MG PO TB24: 50.0000 mg | ORAL_TABLET | Freq: Every day | ORAL | 0 refills | Status: DC

## 2018-06-26 MED ORDER — METOPROLOL SUCCINATE ER 25 MG PO TB24
25.0000 mg | ORAL_TABLET | Freq: Every day | ORAL | Status: DC
  Administered 2018-06-26: 25 mg via ORAL
  Filled 2018-06-26: qty 1

## 2018-06-26 MED ORDER — ASPIRIN 81 MG PO CHEW
81.0000 mg | CHEWABLE_TABLET | Freq: Every day | ORAL | Status: DC
  Administered 2018-06-26: 81 mg via ORAL
  Filled 2018-06-26: qty 1

## 2018-06-26 MED ORDER — APIXABAN 5 MG PO TABS: 5.0000 mg | ORAL_TABLET | Freq: Two times a day (BID) | ORAL | Status: DC

## 2018-06-26 MED ORDER — IOPAMIDOL (ISOVUE-370) INJECTION 76%
75.0000 mL | Freq: Once | INTRAVENOUS | Status: AC | PRN
  Administered 2018-06-26: 75 mL via INTRAVENOUS

## 2018-06-26 MED ORDER — ACETAMINOPHEN 325 MG PO TABS: 650.0000 mg | ORAL_TABLET | ORAL | Status: DC | PRN

## 2018-06-26 MED ORDER — SENNOSIDES-DOCUSATE SODIUM 8.6-50 MG PO TABS: 1.0000 | ORAL_TABLET | Freq: Every evening | ORAL | Status: DC | PRN

## 2018-06-26 MED ORDER — METOPROLOL SUCCINATE ER 50 MG PO TB24: 50.0000 mg | ORAL_TABLET | Freq: Every day | ORAL | Status: DC

## 2018-06-26 MED ORDER — BISACODYL 5 MG PO TBEC: 5.0000 mg | DELAYED_RELEASE_TABLET | Freq: Every day | ORAL | Status: DC | PRN

## 2018-06-26 MED ORDER — HEPARIN BOLUS VIA INFUSION
5000.0000 [IU] | Freq: Once | INTRAVENOUS | Status: AC
  Administered 2018-06-26: 5000 [IU] via INTRAVENOUS
  Filled 2018-06-26: qty 5000

## 2018-06-26 MED ORDER — MOMETASONE FURO-FORMOTEROL FUM 100-5 MCG/ACT IN AERO
2.0000 | INHALATION_SPRAY | Freq: Two times a day (BID) | RESPIRATORY_TRACT | Status: DC
  Administered 2018-06-26: 2 via RESPIRATORY_TRACT
  Filled 2018-06-26: qty 8.8

## 2018-06-26 MED ORDER — ONDANSETRON HCL 4 MG/2ML IJ SOLN
INTRAMUSCULAR | Status: AC
  Filled 2018-06-26: qty 2

## 2018-06-26 MED ORDER — APIXABAN 5 MG PO TABS
5.0000 mg | ORAL_TABLET | Freq: Two times a day (BID) | ORAL | Status: DC
  Administered 2018-06-26: 5 mg via ORAL
  Filled 2018-06-26: qty 1

## 2018-06-26 NOTE — Progress Notes (Signed)
Sean [name] to be D/C'd Home per MD order.  Discussed prescriptions and follow up appointments with the patient. Prescriptions given to patient, medication list explained in detail. Pt verbalized understanding.  Allergies as of 06/26/2018      Reactions   Azithromycin Hives   Erythromycin Hives      Medication List    TAKE these medications   albuterol 108 (90 Base) MCG/ACT inhaler Commonly known as:  PROVENTIL HFA;VENTOLIN HFA Inhale 1-2 puffs into the lungs every 4 (four) hours as needed for wheezing or shortness of breath.   allopurinol 100 MG tablet Commonly known as:  ZYLOPRIM Take 100 mg by mouth 2 (two) times daily.   apixaban 5 MG Tabs tablet Commonly known as:  ELIQUIS 10 mg (two tablets) twice daily for 7 days followed by 5 mg (one tablet) twice daily until follow up.   aspirin EC 81 MG tablet Take 81 mg by mouth daily.   atorvastatin 40 MG tablet Commonly known as:  LIPITOR Take 1 tablet (40 mg total) by mouth daily.   Fluticasone-Salmeterol 100-50 MCG/DOSE Aepb Commonly known as:  ADVAIR Inhale 2 puffs into the lungs daily as needed.   losartan 50 MG tablet Commonly known as:  COZAAR Take 1 tablet (50 mg total) by mouth daily.   metoprolol succinate 50 MG 24 hr tablet Commonly known as:  TOPROL-XL Take 1 tablet (50 mg total) by mouth daily. Take with or immediately following a meal. Start taking on:  06/27/2018 What changed:    medication strength  how much to take  additional instructions   multivitamin tablet Take 1 tablet by mouth daily.   NON FORMULARY Take 1 tablet by mouth daily. otc prostate multivitamin       Vitals:   06/26/18 0919 06/26/18 1337  BP: (!) 145/79 135/71  Pulse: 60 (!) 59  Resp: 16 18  Temp: 98.1 F (36.7 C)   SpO2: 94% 97%    Tele box removed and returned. Skin clean, dry and intact without evidence of skin break down, no evidence of skin tears noted. IV catheter discontinued intact. Site without signs and symptoms  of complications. Dressing and pressure applied. Pt denies pain at this time. No complaints noted.  An After Visit Summary was printed and given to the patient. Patient escorted via [place], and D/C home via private auto.  [name]

## 2018-06-26 NOTE — ED Provider Notes (Signed)
[facility] Emergency Department Provider Note    First MD Initiated Contact with Patient 06/26/18 [phone]     (approximate)  I have reviewed the triage vital signs and the nursing notes.   HISTORY  Chief Complaint Emesis    HPI Sean [name] is a 77 y.o. male with below list of chronic medical conditions including CAD with three-vessel disease currently awaiting CABG presents to the emergency department via EMS with acute onset of dizziness and vomiting which began at 130 tonight.  Patient states that he rolled over in bed and acutely "the room started spinning" followed by vomiting.  Patient denied any chest pain no shortness of breath.  Patient denies any headache weakness or numbness.  Patient does however admit to diaphoresis at the time.  Patient states that dizziness has subsided at this time.   Past Medical History:  Diagnosis Date  . Asthma   . CAD (coronary artery disease)    a. LHC 7/19: ostLM 30%, ostLAD 30%, p-mLAD 99% mod calcified, ostD1 90%, ost-pLCx 80% ulcerative & mod calcified, p-mLCx 50%, OM2 50%, mod dz LPDA, RCA small w/ diffuse dz throughout; b. recommendation of CABG  . Chronic systolic CHF (congestive heart failure) (HCC)    a. EF 35-40%, diffuse HK with HK of the apical, anteroseptal, and anterior myocardium, Gr1DD, mild MR, mildly dilated LA, pacer wire noted in the RV with nl RVSF, PASP nl  . CKD (chronic kidney disease), stage III ([place])   . Deep vein thrombosis (DVT) of upper extremity ([place])    a. DVT of left subclavian dx'd 06/17/2018; b. Eliquis  . History of complete heart block    a. s/p MDT PPM 05/2017  . Hypertension   . Obesity   . PVC's (premature ventricular contractions)    a. PPM-induced    Patient Active Problem List   Diagnosis Date Noted  . Vertigo 06/26/2018  . Coronary artery disease   . Shortness of breath   . Complete heart block ([place]) 06/11/2017  . Acute kidney injury superimposed on CKD ([place]) 06/11/2017   . Obesity 06/11/2017  . Gout 06/11/2017  . Acute CHF (congestive heart failure) ([place]) 06/11/2017    Past Surgical History:  Procedure Laterality Date  . BACK SURGERY    . LEFT HEART CATH AND CORONARY ANGIOGRAPHY N/A 06/13/2018   Procedure: LEFT HEART CATH AND CORONARY ANGIOGRAPHY;  Surgeon: Wellington Hampshire, MD;  Location: [facility];  Service: Cardiovascular;  Laterality: N/A;  . LEG SURGERY    . PACEMAKER IMPLANT N/A 06/11/2017   Procedure: Pacemaker Implant;  Surgeon: Evans Lance, MD;  Location: [facility];  Service: Cardiovascular;  Laterality: N/A;    Prior to Admission medications   Medication Sig Start Date End Date Taking? Authorizing Provider  albuterol (PROVENTIL HFA;VENTOLIN HFA) 108 (90 Base) MCG/ACT inhaler Inhale 1-2 puffs into the lungs every 4 (four) hours as needed for wheezing or shortness of breath.   Yes [provider]  allopurinol (ZYLOPRIM) 100 MG tablet Take 100 mg by mouth 2 (two) times daily.   Yes [provider]  apixaban (ELIQUIS) 5 MG TABS tablet 10 mg (two tablets) twice daily for 7 days followed by 5 mg (one tablet) twice daily until follow up. 06/17/18  Yes Earleen Newport, MD  aspirin EC 81 MG tablet Take 81 mg by mouth daily.   Yes [provider]  atorvastatin (LIPITOR) 40 MG tablet Take 1 tablet (40 mg total)  by mouth daily. 06/07/18 09/05/18 Yes Wellington Hampshire, MD  losartan (COZAAR) 50 MG tablet Take 1 tablet (50 mg total) by mouth daily. 04/30/18 07/29/18 Yes Deboraha Sprang, MD  Multiple Vitamin (MULTIVITAMIN) tablet Take 1 tablet by mouth daily.   Yes [provider]  NON FORMULARY Take 1 tablet by mouth daily. otc prostate multivitamin   Yes [provider]  Fluticasone-Salmeterol (ADVAIR) 100-50 MCG/DOSE AEPB Inhale 2 puffs into the lungs daily as needed.     [provider]  metoprolol succinate (TOPROL-XL) 50 MG 24 hr tablet Take 1 tablet (50 mg total) by mouth daily.  Take with or immediately following a meal. 06/27/18   Bettey Costa, MD    Allergies Azithromycin and Erythromycin  Family History  Problem Relation Age of Onset  . Stroke Mother   . Stroke Father   . Alcohol abuse Father   . Diabetes Brother   . Alcohol abuse Brother   . Stroke Maternal Grandfather     Social History Social History   Tobacco Use  . Smoking status: Never Smoker  . Smokeless tobacco: Never Used  Substance Use Topics  . Alcohol use: Yes    Comment: occ.  . Drug use: No    Review of Systems Constitutional: No fever/chills Eyes: No visual changes. ENT: No sore throat. Cardiovascular: Denies chest pain. Respiratory: Denies shortness of breath. Gastrointestinal: No abdominal pain.  No nausea, no vomiting.  No diarrhea.  No constipation. Genitourinary: Negative for dysuria. Musculoskeletal: Negative for neck pain.  Negative for back pain. Integumentary: Negative for rash. Neurological: Positive for acute onset of dizziness, no headache, no weakness or numbness. ____________________________________________   PHYSICAL EXAM:  VITAL SIGNS: ED Triage Vitals  Enc Vitals Group     BP 06/26/18 0225 (!) 162/92     Pulse Rate 06/26/18 0225 65     Resp 06/26/18 0225 11     Temp 06/26/18 0225 98.1 F (36.7 C)     Temp Source 06/26/18 0225 Oral     SpO2 06/26/18 0225 97 %     Weight 06/26/18 0226 98.4 kg (217 lb)     Height 06/26/18 0226 1.753 m (5\' 9" )     Head Circumference --      Peak Flow --      Pain Score 06/26/18 0226 0     Pain Loc --      Pain Edu? --      Excl. in [place]? --     Constitutional: Alert and oriented. Well appearing and in no acute distress. Eyes: Conjunctivae are normal. PERRL. EOMI. Head: Atraumatic. Ears:  Healthy appearing ear canals and TMs bilaterally Nose: No congestion/rhinnorhea. Mouth/Throat: Mucous membranes are moist.  Oropharynx non-erythematous. Neck: No stridor.  No meningeal signs.   Cardiovascular: Normal rate,  regular rhythm. Good peripheral circulation. Grossly normal heart sounds. Respiratory: Normal respiratory effort.  No retractions. Lungs CTAB. Gastrointestinal: Soft and nontender. No distention.  Musculoskeletal: No lower extremity tenderness nor edema. No gross deformities of extremities. Neurologic:  Normal speech and language. No gross focal neurologic deficits are appreciated.  Skin:  Skin is warm, dry and intact. No rash noted. Psychiatric: Mood and affect are normal. Speech and behavior are normal.  ____________________________________________   LABS (all labs ordered are listed, but only abnormal results are displayed)  Labs Reviewed  BASIC METABOLIC PANEL - Abnormal; Notable for the following components:      Result Value   Glucose, Bld 142 (*)  BUN 29 (*)    Creatinine, Ser 1.36 (*)    GFR calc non Af Amer 49 (*)    GFR calc Af Amer 57 (*)    All other components within normal limits  CBC - Abnormal; Notable for the following components:   RBC 4.17 (*)    All other components within normal limits  PROTIME-INR - Abnormal; Notable for the following components:   Prothrombin Time 15.7 (*)    All other components within normal limits  HEPARIN LEVEL (UNFRACTIONATED) - Abnormal; Notable for the following components:   Heparin Unfractionated 2.24 (*)    All other components within normal limits  HEMOGLOBIN A1C - Abnormal; Notable for the following components:   Hgb A1c MFr Bld 6.0 (*)    All other components within normal limits  HEPATIC FUNCTION PANEL - Abnormal; Notable for the following components:   Total Bilirubin 1.3 (*)    Indirect Bilirubin 1.1 (*)    All other components within normal limits  TROPONIN I  TROPONIN I  TROPONIN I  APTT  LIPID PANEL  TSH  MAGNESIUM   ____________________________________________  EKG  ED ECG REPORT I, [place] N Dalaney Needle, the attending physician, personally viewed and interpreted this ECG.   Date: 06/26/2018  EKG Time: 3:22  AM  Rate: 62  Rhythm: AV paced rhythm  Axis: Normal  Intervals: Normal  ST&T Change: None  ____________________________________________  RADIOLOGY I, [place] N Qianna Clagett, personally viewed and evaluated these images (plain radiographs) as part of my medical decision making, as well as reviewing the written report by the radiologist.  ED MD interpretation: No active cardiopulmonary disease on chest x-ray per radiologist.  CT head revealed no acute intracranial findings per radiologist.  CT chest revealed no pulmonary emboli per radiologist.  Official radiology report(s): Dg Chest 2 View  Result Date: 06/26/2018 CLINICAL DATA:  Dizziness and vomiting EXAM: CHEST - 2 VIEW COMPARISON:  06/17/2018 FINDINGS: There is a leftchest wall pacemakerwith leads projecting within the right atrium and right ventricle. The heart size and mediastinal contours are within normal limits. Both lungs are clear. The visualized skeletal structures are unremarkable. IMPRESSION: No active cardiopulmonary disease. Electronically Signed   By: Ulyses Jarred M.D.   On: 06/26/2018 02:57   Ct Head Wo Contrast  Result Date: 06/26/2018 CLINICAL DATA:  Dizziness.  Anti coagulation. EXAM: CT HEAD WITHOUT CONTRAST TECHNIQUE: Contiguous axial images were obtained from the base of the skull through the vertex without intravenous contrast. COMPARISON:  Head CT 08/13/2014 FINDINGS: Brain: There is no mass, hemorrhage or extra-axial collection. The size and configuration of the ventricles and extra-axial CSF spaces are normal. There is no acute or chronic infarction. The brain parenchyma is normal. Vascular: No abnormal hyperdensity of the major intracranial arteries or dural venous sinuses. No intracranial atherosclerosis. Skull: The visualized skull base, calvarium and extracranial soft tissues are normal. Sinuses/Orbits: No fluid levels or advanced mucosal thickening of the visualized paranasal sinuses. No mastoid or middle ear  effusion. The orbits are normal. IMPRESSION: Normal aging brain. Electronically Signed   By: Ulyses Jarred M.D.   On: 06/26/2018 04:05   Ct Angio Chest Pe W And/or Wo Contrast  Result Date: 06/26/2018 CLINICAL DATA:  Chest pain.  Left upper extremity DVT. EXAM: CT ANGIOGRAPHY CHEST WITH CONTRAST TECHNIQUE: Multidetector CT imaging of the chest was performed using the standard protocol during bolus administration of intravenous contrast. Multiplanar CT image reconstructions and MIPs were obtained to evaluate the vascular anatomy. CONTRAST:  81mL  ISOVUE-370 IOPAMIDOL (ISOVUE-370) INJECTION 76% COMPARISON:  Chest CT 05/29/2018 FINDINGS: Cardiovascular: --Pulmonary arteries: Contrast injection is sufficient to demonstrate satisfactory opacification of the pulmonary arteries to the segmental level. There is no pulmonary embolus. The main pulmonary artery is within normal limits for size. --Aorta: Limited opacification of the aorta due to bolus timing optimization for the pulmonary arteries. Conventional 3 vessel aortic branching pattern. The aortic course and caliber are normal. There is mild aortic atherosclerosis. --Heart: Normal size. No pericardial effusion. Mediastinum/Nodes: No mediastinal, hilar or axillary lymphadenopathy. The visualized thyroid and thoracic esophageal course are unremarkable. Lungs/Pleura: No pulmonary nodules or masses. No pleural effusion or pneumothorax. No focal airspace consolidation. No focal pleural abnormality. Upper Abdomen: Contrast bolus timing is not optimized for evaluation of the abdominal organs. Within this limitation, the visualized organs of the upper abdomen are normal. Musculoskeletal: No chest wall abnormality. No acute or significant osseous findings. Review of the MIP images confirms the above findings. IMPRESSION: 1. No pulmonary embolus or other acute thoracic abnormality. 2.  Aortic Atherosclerosis (ICD10-I70.0). Electronically Signed   By: Ulyses Jarred M.D.   On:  06/26/2018 04:00    ____________________________________________   :   Procedures   ____________________________________________   INITIAL IMPRESSION / ASSESSMENT AND PLAN / ED COURSE  As part of my medical decision making, I reviewed the following data within the electronic MEDICAL RECORD NUMBER   77 year old male with above-stated history and physical exam presents to the emergency department with acute onset of vertiginous symptoms.  Consider the possibility of intracranial abnormality given recent anticoagulation secondary to left arm DVT.  CT head revealed no acute intracranial pathology.  Also consider possibly of a pulmonary emboli and as such CT chest was performed which was unremarkable as well.  Patient states that he has never had any chest pain or shortness of breath with any of his cardiac events and as such consider the possibility of cardiac pathology EKG revealed no acute findings, laboratory data including troponin unremarkable.  Patient symptoms most consistent with benign paroxysmal positional vertigo.  I discussed this with the patient and his wife however patient and wife uncomfortable with possibility of being discharged home and is requesting to remain in the hospital for further cardiac evaluation.  Patient will be admitted to the hospital to Dr. Aliene Altes for further evaluation.    ____________________________________________  FINAL CLINICAL IMPRESSION(S) / ED DIAGNOSES  Vertigo  MEDICATIONS GIVEN DURING THIS VISIT:  Medications  ondansetron (ZOFRAN) 4 MG/2ML injection (  Not Given 06/26/18 0302)  ondansetron (ZOFRAN) injection 4 mg (4 mg Intravenous Given 06/26/18 0300)  iopamidol (ISOVUE-370) 76 % injection 75 mL (75 mLs Intravenous Contrast Given 06/26/18 0330)  heparin bolus via infusion 5,000 Units (5,000 Units Intravenous Bolus from Bag 06/26/18 0931)     ED Discharge Orders        Ordered    metoprolol succinate (TOPROL-XL) 50 MG 24 hr tablet  Daily      06/26/18 1509    Increase activity slowly     06/26/18 1500    Discharge instructions    Comments:  DIET: Cardiac diet  DISCHARGE CONDITION: Stable  ACTIVITY: Activity as tolerated  OXYGEN: Home Oxygen: No.   Oxygen Delivery: room air   If you experience worsening of your admission symptoms, develop shortness of breath, life threatening emergency, suicidal or homicidal thoughts you must seek medical attention immediately by calling 911 or calling your MD immediately  if symptoms less severe.  You Must read complete instructions/literature  along with all the possible adverse reactions/side effects for all the Medicines you take and that have been prescribed to you. Take any new Medicines after you have completely understood and accept all the possible adverse reactions/side effects.   Please note  You were cared for by a hospitalist during your hospital stay. If you have any questions about your discharge medications or the care you received while you were in the hospital after you are discharged, you can call the unit and asked to speak with the hospitalist on call if the hospitalist that took care of you is not available. Once you are discharged, your primary care physician will handle any further medical issues. Please note that NO REFILLS for any discharge medications will be authorized once you are discharged, as it is imperative that you return to your primary care physician (or establish a relationship with a primary care physician if you do not have one) for your aftercare needs so that they can reassess your need for medications and monitor your lab values.   I   06/26/18 1500       Note:  This document was prepared using Dragon voice recognition software and may include unintentional dictation errors.    Gregor Hams, MD 06/26/18 2249

## 2018-06-26 NOTE — Progress Notes (Signed)
Family Meeting Note  Advance Directive:yes  Today a meeting took place with the Patient.and spouse   The following clinical team members were present during this meeting:MD  The following were discussed:Patient's diagnosis severe CAD DVT: , Patient's progosis: > 12 months and Goals for treatment: Full Code  Additional follow-up to be provided: FULL CODE Advanced directives in place no need to change  Time spent during discussion 16 minutes  Andra Matsuo, MD

## 2018-06-26 NOTE — Progress Notes (Signed)
ANTICOAGULATION CONSULT NOTE - Initial Consult  Pharmacy Consult for heparin Indication: VTE  Allergies  Allergen Reactions  . Azithromycin Hives  . Erythromycin Hives    Patient Measurements: Height: 5\' 9"  (175.3 cm) Weight: 215 lb 4.8 oz (97.7 kg) IBW/kg (Calculated) : 70.7 Heparin Dosing Weight: 91.2 kg  Vital Signs: Temp: 97.7 F (36.5 C) (08/07 0552) Temp Source: Oral (08/07 0552) BP: 175/77 (08/07 0552) Pulse Rate: 60 (08/07 0552)  Labs: Recent Labs    06/26/18 0223 06/26/18 0605  HGB 13.7  --   HCT 40.1  --   PLT 193  --   LABPROT 15.7*  --   INR 1.26  --   CREATININE 1.36*  --   TROPONINI <0.03 <0.03    Estimated Creatinine Clearance: 53.3 mL/min (A) (by C-G formula based on SCr of 1.36 mg/dL (H)).   Medical History: Past Medical History:  Diagnosis Date  . Asthma   . CHF (congestive heart failure) ([place])   . CKD (chronic kidney disease), stage III ([place])   . History of complete heart block   . Hypertension   . Obesity     Medications:  Infusions:  . heparin      Assessment: 77 yom cc emesis with PMH severe multi-vessel CAD and recent stent pending CABG, CHB followed by EP, mixed HF EF 35 to 40%, recent LUE DVT. Patient came in on Eliquis. Per patient/wife last dose was 06/25/18 at approximately 20:00. Eliquis discontinued and heparin consult placed by cardiology. Pharmacy consulted to manage heparin.   Goal of Therapy:  Heparin level 0.3-0.7 units/ml aPTT 68 to 109 seconds Monitor platelets by anticoagulation protocol: Yes   Plan:  Give 5000 units bolus x 1 Start heparin infusion at 1500 units/hr Check aPTT level Q6H until therapeutic x 2, check HL daily until HL and aPTT correlate, then switch to HL monitoring per protocol (PTA Anti-Xa) Continue to monitor H&H and platelets  Laural Benes, Pharm.D., BCPS Clinical Pharmacist 06/26/2018,8:18 AM

## 2018-06-26 NOTE — Care Management Obs Status (Signed)
[place] NOTIFICATION   Patient Details  Name: Sean [name] MRN: [id number] Date of Birth: Nov 22, 1940   Medicare Observation Status Notification Given:  No(admitted obs less than 24 hours)    Beverly Sessions, RN 06/26/2018, 4:45 PM

## 2018-06-26 NOTE — Progress Notes (Signed)
Patient ambulated around nurses station x2 and tolerated well, no dizziness, no cp. Will continue to monitor.

## 2018-06-26 NOTE — Progress Notes (Signed)
Patient seen and examined.  Case discussed with Sean [name].  Awaiting callback from Sean [name] if patient will need to be transferred to [facility].

## 2018-06-26 NOTE — H&P (Signed)
[place] [facility] [place] NAME: Sean [name]    MR#:  [id number]  DATE OF BIRTH:  11-Nov-1941  DATE OF ADMISSION:  06/26/2018  PRIMARY CARE PHYSICIAN: [name], MD   REQUESTING/REFERRING PHYSICIAN: Gregor Hams, MD  CHIEF COMPLAINT:   Chief Complaint  Patient presents with  . Emesis    HISTORY OF PRESENT ILLNESS:  Sean [name]  is a 77 y.o. male with a known history of severe multi-vessel CAD (per recent 06/13/2018 cardiac cath by Dr. Fletcher Anon; pending CABG w/ Dr. Prescott Gum, last seen 06/25/2018), CHB (s/p PPM, followed by Dr. Caryl Comes), chronic systolic + diastolic CHF (EF 78-29% + grade 1 diastolic dyfxn as of 56/21/3086), recent LUE DVT (Eliquis) p/w vertigo/N/V x1d.  PAST MEDICAL HISTORY:   Past Medical History:  Diagnosis Date  . Asthma   . CHF (congestive heart failure) ([place])   . CKD (chronic kidney disease), stage III ([place])   . History of complete heart block   . Hypertension   . Obesity     PAST SURGICAL HISTORY:   Past Surgical History:  Procedure Laterality Date  . BACK SURGERY    . LEFT HEART CATH AND CORONARY ANGIOGRAPHY N/A 06/13/2018   Procedure: LEFT HEART CATH AND CORONARY ANGIOGRAPHY;  Surgeon: Wellington Hampshire, MD;  Location: [facility];  Service: Cardiovascular;  Laterality: N/A;  . LEG SURGERY    . PACEMAKER IMPLANT N/A 06/11/2017   Procedure: Pacemaker Implant;  Surgeon: Evans Lance, MD;  Location: [facility];  Service: Cardiovascular;  Laterality: N/A;    SOCIAL HISTORY:   Social History   Tobacco Use  . Smoking status: Never Smoker  . Smokeless tobacco: Never Used  Substance Use Topics  . Alcohol use: Yes    Comment: occ.    FAMILY HISTORY:   Family History  Problem Relation Age of Onset  . Stroke Mother   . Stroke Father     DRUG ALLERGIES:   Allergies  Allergen Reactions  . Azithromycin Hives  . Erythromycin Hives    REVIEW OF SYSTEMS:   Review of Systems    Constitutional: Negative for chills, diaphoresis, fever, malaise/fatigue and weight loss.  HENT: Negative for congestion, ear pain, hearing loss, nosebleeds, sinus pain, sore throat and tinnitus.   Eyes: Negative for blurred vision, double vision and photophobia.  Respiratory: Negative for cough, hemoptysis, sputum production, shortness of breath and wheezing.   Cardiovascular: Negative for chest pain, palpitations, orthopnea, claudication, leg swelling and PND.  Gastrointestinal: Positive for nausea and vomiting. Negative for abdominal pain, blood in stool, constipation, diarrhea, heartburn and melena.  Genitourinary: Negative for dysuria, frequency, hematuria and urgency.  Musculoskeletal: Negative for back pain, falls, joint pain, myalgias and neck pain.  Skin: Negative for itching and rash.  Neurological: Positive for dizziness (+) vertigo. Negative for tingling, tremors, sensory change, speech change, focal weakness, seizures, loss of consciousness, weakness and headaches.  Psychiatric/Behavioral: Negative for memory loss. The patient does not have insomnia.    MEDICATIONS AT HOME:   Prior to Admission medications   Medication Sig Start Date End Date Taking? Authorizing Provider  albuterol (PROVENTIL HFA;VENTOLIN HFA) 108 (90 Base) MCG/ACT inhaler Inhale 1-2 puffs into the lungs every 4 (four) hours as needed for wheezing or shortness of breath.   Yes [provider]  allopurinol (ZYLOPRIM) 100 MG tablet Take 100 mg by mouth 2 (two) times daily.   Yes [provider]  apixaban (  ELIQUIS) 5 MG TABS tablet 10 mg (two tablets) twice daily for 7 days followed by 5 mg (one tablet) twice daily until follow up. 06/17/18  Yes Earleen Newport, MD  aspirin EC 81 MG tablet Take 81 mg by mouth daily.   Yes [provider]  atorvastatin (LIPITOR) 40 MG tablet Take 1 tablet (40 mg total) by mouth daily. 06/07/18 09/05/18 Yes Wellington Hampshire, MD  losartan (COZAAR) 50 MG  tablet Take 1 tablet (50 mg total) by mouth daily. 04/30/18 07/29/18 Yes Deboraha Sprang, MD  metoprolol succinate (TOPROL-XL) 25 MG 24 hr tablet Take 1 tablet (25 mg total) by mouth daily. 04/30/18  Yes Deboraha Sprang, MD  Multiple Vitamin (MULTIVITAMIN) tablet Take 1 tablet by mouth daily.   Yes [provider]  NON FORMULARY Take 1 tablet by mouth daily. otc prostate multivitamin   Yes [provider]  Fluticasone-Salmeterol (ADVAIR) 100-50 MCG/DOSE AEPB Inhale 2 puffs into the lungs daily as needed.     [provider]      VITAL SIGNS:  Blood pressure (!) 177/81, pulse 60, temperature 98.1 F (36.7 C), temperature source Oral, resp. rate 20, height 5\' 9"  (1.753 m), weight 98.4 kg (217 lb), SpO2 93 %.  PHYSICAL EXAMINATION:  Physical Exam  Constitutional: He is oriented to person, place, and time. He appears well-developed and well-nourished. He is active and cooperative.  Non-toxic appearance. He does not have a sickly appearance. He does not appear ill. No distress. He is not intubated.  HENT:  Head: Normocephalic and atraumatic.  Mouth/Throat: Oropharynx is clear and moist. No oropharyngeal exudate.  Eyes: Conjunctivae, EOM and lids are normal. No scleral icterus.  Neck: Neck supple. No JVD present. No thyromegaly present.  Cardiovascular: Normal rate, regular rhythm, S1 normal and S2 normal.  No extrasystoles are present. Exam reveals no gallop, no S3, no S4, no distant heart sounds and no friction rub.  Murmur heard.  Systolic murmur is present with a grade of 2/6. Pulmonary/Chest: Effort normal and breath sounds normal. No accessory muscle usage or stridor. No apnea, no tachypnea and no bradypnea. He is not intubated. No respiratory distress. He has no decreased breath sounds. He has no wheezes. He has no rhonchi. He has no rales.  (+) L chest PPM.  Abdominal: Soft. Normal appearance and bowel sounds are normal. He exhibits no distension. There is no  tenderness. There is no rigidity, no rebound and no guarding.  Musculoskeletal: Normal range of motion. He exhibits no edema or tenderness.  Lymphadenopathy:    He has no cervical adenopathy.  Neurological: He is alert and oriented to person, place, and time. He is not disoriented.  Skin: Skin is warm, dry and intact. No rash noted. He is not diaphoretic. No erythema.  Psychiatric: He has a normal mood and affect. His speech is normal and behavior is normal. Judgment and thought content normal. Cognition and memory are normal.   LABORATORY PANEL:   CBC Recent Labs  Lab 06/26/18 0223  WBC 9.8  HGB 13.7  HCT 40.1  PLT 193   ------------------------------------------------------------------------------------------------------------------  Chemistries  Recent Labs  Lab 06/26/18 0223  NA 141  K 4.1  CL 104  CO2 31  GLUCOSE 142*  BUN 29*  CREATININE 1.36*  CALCIUM 9.0   ------------------------------------------------------------------------------------------------------------------  Cardiac Enzymes Recent Labs  Lab 06/26/18 0223  TROPONINI <0.03   ------------------------------------------------------------------------------------------------------------------  RADIOLOGY:  Dg Chest 2 View  Result Date: 06/26/2018 CLINICAL DATA:  Dizziness and  vomiting EXAM: CHEST - 2 VIEW COMPARISON:  06/17/2018 FINDINGS: There is a leftchest wall pacemakerwith leads projecting within the right atrium and right ventricle. The heart size and mediastinal contours are within normal limits. Both lungs are clear. The visualized skeletal structures are unremarkable. IMPRESSION: No active cardiopulmonary disease. Electronically Signed   By: Ulyses Jarred M.D.   On: 06/26/2018 02:57   Ct Head Wo Contrast  Result Date: 06/26/2018 CLINICAL DATA:  Dizziness.  Anti coagulation. EXAM: CT HEAD WITHOUT CONTRAST TECHNIQUE: Contiguous axial images were obtained from the base of the skull through the vertex  without intravenous contrast. COMPARISON:  Head CT 08/13/2014 FINDINGS: Brain: There is no mass, hemorrhage or extra-axial collection. The size and configuration of the ventricles and extra-axial CSF spaces are normal. There is no acute or chronic infarction. The brain parenchyma is normal. Vascular: No abnormal hyperdensity of the major intracranial arteries or dural venous sinuses. No intracranial atherosclerosis. Skull: The visualized skull base, calvarium and extracranial soft tissues are normal. Sinuses/Orbits: No fluid levels or advanced mucosal thickening of the visualized paranasal sinuses. No mastoid or middle ear effusion. The orbits are normal. IMPRESSION: Normal aging brain. Electronically Signed   By: Ulyses Jarred M.D.   On: 06/26/2018 04:05   Ct Angio Chest Pe W And/or Wo Contrast  Result Date: 06/26/2018 CLINICAL DATA:  Chest pain.  Left upper extremity DVT. EXAM: CT ANGIOGRAPHY CHEST WITH CONTRAST TECHNIQUE: Multidetector CT imaging of the chest was performed using the standard protocol during bolus administration of intravenous contrast. Multiplanar CT image reconstructions and MIPs were obtained to evaluate the vascular anatomy. CONTRAST:  20mL ISOVUE-370 IOPAMIDOL (ISOVUE-370) INJECTION 76% COMPARISON:  Chest CT 05/29/2018 FINDINGS: Cardiovascular: --Pulmonary arteries: Contrast injection is sufficient to demonstrate satisfactory opacification of the pulmonary arteries to the segmental level. There is no pulmonary embolus. The main pulmonary artery is within normal limits for size. --Aorta: Limited opacification of the aorta due to bolus timing optimization for the pulmonary arteries. Conventional 3 vessel aortic branching pattern. The aortic course and caliber are normal. There is mild aortic atherosclerosis. --Heart: Normal size. No pericardial effusion. Mediastinum/Nodes: No mediastinal, hilar or axillary lymphadenopathy. The visualized thyroid and thoracic esophageal course are  unremarkable. Lungs/Pleura: No pulmonary nodules or masses. No pleural effusion or pneumothorax. No focal airspace consolidation. No focal pleural abnormality. Upper Abdomen: Contrast bolus timing is not optimized for evaluation of the abdominal organs. Within this limitation, the visualized organs of the upper abdomen are normal. Musculoskeletal: No chest wall abnormality. No acute or significant osseous findings. Review of the MIP images confirms the above findings. IMPRESSION: 1. No pulmonary embolus or other acute thoracic abnormality. 2.  Aortic Atherosclerosis (ICD10-I70.0). Electronically Signed   By: Ulyses Jarred M.D.   On: 06/26/2018 04:00   IMPRESSION AND PLAN:   A/P: 20J w/ severe multivessel CAD p/w vertigo/N/V, suspect BPPV. -Pt, his wife, ED provider and myself are all concerned with regards to pt's severe CAD -Admit Tele, continuous cardiac monitoring -Continuous pulse oximetry -Trop-I (-) x1, rpt pending -c/w ASA, statin, beta blocker, ARB, Eliquis -Recent LUE DVT, on Eliquis -CTA chest (-) PE -I am of the professional opinion that the pt's active issues (in the context of his overall global health) are presently misprioritized; I suspect it would be more prudent to place an IVC filter and take pt for CABG sooner rather than later (as opposed to prioritizing mgmt of LUE DVT); I, however, lack the cache and political capital to compel said intervention; as  such, I will consult Cardiology and humbly request they discuss the case w/ Cardiothoracics, in the hope of expediting surgery (at least to as great an extent as possible in the present circumstance) -CT head (-) acute intracranial abnl -Meclizine + Zofran PRN -Fall precautions -Consider canalith repositioning and/or ENT consult if persistent -c/w other home meds/formulary subs -Cardiac diet -Eliquis -Full code -Observation, < 2 midnights   All the records are reviewed and case discussed with ED provider. Management plans  discussed with the patient, family and they are in agreement.  CODE STATUS: Full code  TOTAL TIME TAKING CARE OF THIS PATIENT: 75 minutes.    Arta Silence M.D on 06/26/2018 at 5:17 AM  Between 7am to 6pm - Pager - [phone]  After 6pm go to [email or web address] - Proofreader  [facility]  Office  [phone]  CC: Primary care physician; [name], MD   Note: This dictation was prepared with Dragon dictation along with smaller phrase technology. Any transcriptional errors that result from this process are unintentional.

## 2018-06-26 NOTE — Consult Note (Signed)
Cardiology Consultation:   Patient ID: Sean [name]; [id number]; May 16, 1941   Admit date: 06/26/2018 Date of Consult: 06/26/2018  Primary Care Provider: [name], MD Primary Cardiologist: Fletcher Anon Primary Electrophysiologist:  Caryl Comes   Patient Profile:   Sean [name] is a 77 y.o. male with a hx of recently diagnosed multivessel CAD as detailed below by [facility] 06/13/2018, complete heart block s/p MDT PPM in 05/2017, pacemaker-induced PVCs, chronic systolic CHF with an EF of 35-40% by TTE 03/2018, recently diagnosed left subclavian DVT on 06/17/2018 on Eliquis, CKD stage III, asthma, and HTN who is being seen today for the evaluation of multivessel CAD in the setting of possible vertigo at the request of Dr. Jodell Cipro.   History of Present Illness:   Mr. Lawry previously underwent Myoview in 07/2014 that was negative for ischemia with an EF of 55-65%. He was admitted to the hospital in 05/2017 with dizziness and found to be in complete heart block. He underwent successful implantation of MDT dual-chamber PPM on 06/11/2017. In follow in in 08/2017 he was ntoed to have frequent PVCs on device interrogation of ~ 5.5% burden. Follow up echo in 08/2017 showed an EF of 50-55%, no RWMA, Gr1DD, mild MR, left atrium normal in size, pacer wire noted in the RV with normal RVSF, PASP normal. Follow up echo in 03/2018 showed a newly reduced EF of 35-40%, diffuse HK with HK of the apical, anteroseptal, and anterior myocardium, Gr1DD, mild MR, left atrium mildly dilated, pacer wire noted in the RV with normal RVSF, PASP normal. In follow up with EP on 04/30/18 there was continued frequent PVCs on device interrogation accounting for ~ > 9% burden. He underwent cardiac CTA  To further assess his cardiomyopathy that showed evidence of 3-vessel CAD with a left dominant system and FFR less than 0.6 in all vessels. Because of this, he underwent LHC on 06/13/2018 that showed severe, heavily calcified 3-vessel CAD with subtotal  occlusion of the mid LAD, ulcerated plaque in the proximal LCx and a small RCA that was nondominant and diffusely diseased. Full cath report is noted below. It was recommended the patient be evaluated for CABG. Unfortunately, patient noted some left arm swelling following his cath (he underwent right radial artery approach). He was seen in the ED and noted to have a left subclavian DVT. He was started on Eliquis on 06/17/2018. He was seen by Dr. Prescott Gum on 06/25/2018 for initial consult of possible CABG with recommendation for CABG following improvement/treatment of his left subclavian DVT. Recommendation was to proceed in ~ 3-4 weeks time after repeat ultrasound of the left arm DVT as well as a CT of his chest to rule out mass or pathology in the left lung apex.   Patient was feeling well at his appointment with surgery. Upon leaving there, he went to [employer] and ate "some things I should not have." He then had long discussions with his family regarding his need for surgery. Around 10 PM, on 8/6, he was laying in bed and rolled over onto his side. Following this, he opened his eyes and noted the room was spinning around him. There was no chest pain, SOB, nausea, vomiting, palpitations, presyncope, or syncope. He got up and went to the kitchen to get some water. This initially seemed to help with the room-spinning sensation. However, he went to the restroom and upon sitting down the room-spinning sensation required and was worse this time without associated nausea and vomiting. Again, there was  no chest pain, SOB, or palpitations. He asked his wife to call EMS.   Upon the patient's arrival to [facility] they were found to have stable vitals with pulse ox mid to upper 90s pn room air. EKG showed an AV paced rhythm, CXR showed no active cardiopulmonary disease. CT head not acute. CTA chest was negative for PE without acute thoracic process. Labs showed troponin negative x 3, WBC 9.8, HGB 13.7, PLT 193, SCr 1.36  (baseline ~ 1.3), K+ 4.1, Na 141, glucose 142, magnesium 2.1, TSH normal, A1c pending, LDL 60. It appears he was placed on his prior to admission medications and cardiology was asked to evaluate. Currently, patient is asymptomatic. He last had an episode of room-spinning in the ED.   Past Medical History:  Diagnosis Date  . Asthma   . CAD (coronary artery disease)    a. LHC 7/19: ostLM 30%, ostLAD 30%, p-mLAD 99% mod calcified, ostD1 90%, ost-pLCx 80% ulcerative & mod calcified, p-mLCx 50%, OM2 50%, mod dz LPDA, RCA small w/ diffuse dz throughout; b. recommendation of CABG  . Chronic systolic CHF (congestive heart failure) (HCC)    a. EF 35-40%, diffuse HK with HK of the apical, anteroseptal, and anterior myocardium, Gr1DD, mild MR, mildly dilated LA, pacer wire noted in the RV with nl RVSF, PASP nl  . CKD (chronic kidney disease), stage III ([place])   . Deep vein thrombosis (DVT) of upper extremity ([place])    a. DVT of left subclavian dx'd 06/17/2018; b. Eliquis  . History of complete heart block    a. s/p MDT PPM 05/2017  . Hypertension   . Obesity   . PVC's (premature ventricular contractions)    a. PPM-induced    Past Surgical History:  Procedure Laterality Date  . BACK SURGERY    . LEFT HEART CATH AND CORONARY ANGIOGRAPHY N/A 06/13/2018   Procedure: LEFT HEART CATH AND CORONARY ANGIOGRAPHY;  Surgeon: Wellington Hampshire, MD;  Location: [place] [facility];  Service: Cardiovascular;  Laterality: N/A;  . LEG SURGERY    . PACEMAKER IMPLANT N/A 06/11/2017   Procedure: Pacemaker Implant;  Surgeon: Evans Lance, MD;  Location: [place] [facility];  Service: Cardiovascular;  Laterality: N/A;     Home Meds: Prior to Admission medications   Medication Sig Start Date End Date Taking? Authorizing Provider  albuterol (PROVENTIL HFA;VENTOLIN HFA) 108 (90 Base) MCG/ACT inhaler Inhale 1-2 puffs into the lungs every 4 (four) hours as needed for wheezing or shortness of breath.   Yes [provider]  allopurinol (ZYLOPRIM) 100 MG tablet Take 100 mg by mouth 2 (two) times daily.   Yes [provider]  apixaban (ELIQUIS) 5 MG TABS tablet 10 mg (two tablets) twice daily for 7 days followed by 5 mg (one tablet) twice daily until follow up. 06/17/18  Yes Earleen Newport, MD  aspirin EC 81 MG tablet Take 81 mg by mouth daily.   Yes [provider]  atorvastatin (LIPITOR) 40 MG tablet Take 1 tablet (40 mg total) by mouth daily. 06/07/18 09/05/18 Yes Wellington Hampshire, MD  losartan (COZAAR) 50 MG tablet Take 1 tablet (50 mg total) by mouth daily. 04/30/18 07/29/18 Yes Deboraha Sprang, MD  metoprolol succinate (TOPROL-XL) 25 MG 24 hr tablet Take 1 tablet (25 mg total) by mouth daily. 04/30/18  Yes Deboraha Sprang, MD  Multiple Vitamin (MULTIVITAMIN) tablet Take 1 tablet by mouth daily.   Yes [provider]  NON FORMULARY Take  1 tablet by mouth daily. otc prostate multivitamin   Yes [provider]  Fluticasone-Salmeterol (ADVAIR) 100-50 MCG/DOSE AEPB Inhale 2 puffs into the lungs daily as needed.     [provider]    Inpatient Medications: Scheduled Meds: . allopurinol  100 mg Oral BID  . aspirin  81 mg Oral Daily  . atorvastatin  40 mg Oral Daily  . heparin  5,000 Units Intravenous Once  . losartan  50 mg Oral Daily  . metoprolol succinate  25 mg Oral Daily  . mometasone-formoterol  2 puff Inhalation BID  . multivitamin with minerals  1 tablet Oral Daily  . ondansetron       Continuous Infusions: . heparin     PRN Meds: acetaminophen, albuterol, bisacodyl, meclizine, morphine injection, nitroGLYCERIN, ondansetron (ZOFRAN) IV, senna-docusate  Allergies:   Allergies  Allergen Reactions  . Azithromycin Hives  . Erythromycin Hives    Social History:   Social History   Socioeconomic History  . Marital status: Married    Spouse name: Not on file  . Number of children: Not on file  . Years of education: Not on file  .  Highest education level: Not on file  Occupational History  . Not on file  Social Needs  . Financial resource strain: Not on file  . Food insecurity:    Worry: Not on file    Inability: Not on file  . Transportation needs:    Medical: Not on file    Non-medical: Not on file  Tobacco Use  . Smoking status: Never Smoker  . Smokeless tobacco: Never Used  Substance and Sexual Activity  . Alcohol use: Yes    Comment: occ.  . Drug use: No  . Sexual activity: Not on file  Lifestyle  . Physical activity:    Days per week: Not on file    Minutes per session: Not on file  . Stress: Not on file  Relationships  . Social connections:    Talks on phone: Not on file    Gets together: Not on file    Attends religious service: Not on file    Active member of club or organization: Not on file    Attends meetings of clubs or organizations: Not on file    Relationship status: Not on file  . Intimate partner violence:    Fear of current or ex partner: Not on file    Emotionally abused: Not on file    Physically abused: Not on file    Forced sexual activity: Not on file  Other Topics Concern  . Not on file  Social History Narrative  . Not on file     Family History:   Family History  Problem Relation Age of Onset  . Stroke Mother   . Stroke Father   . Alcohol abuse Father   . Diabetes Brother   . Alcohol abuse Brother   . Stroke Maternal Grandfather     ROS:  Review of Systems  Constitutional: Positive for malaise/fatigue. Negative for chills, diaphoresis, fever and weight loss.  HENT: Negative for congestion.   Eyes: Negative for discharge and redness.  Respiratory: Negative for cough, hemoptysis, sputum production, shortness of breath and wheezing.   Cardiovascular: Negative for chest pain, palpitations, orthopnea, claudication, leg swelling and PND.       Left arm swelling.  Gastrointestinal: Positive for diarrhea, nausea and vomiting. Negative for abdominal pain, blood in  stool, constipation, heartburn and melena.  Genitourinary: Negative for  hematuria.  Musculoskeletal: Negative for falls and myalgias.  Skin: Negative for rash.  Neurological: Positive for dizziness and weakness. Negative for tingling, tremors, sensory change, speech change, focal weakness, loss of consciousness and headaches.       Vertigo   Endo/Heme/Allergies: Does not bruise/bleed easily.  Psychiatric/Behavioral: Negative for substance abuse. The patient is not nervous/anxious.   All other systems reviewed and are negative.     Physical Exam/Data:   Vitals:   06/26/18 0226 06/26/18 0230 06/26/18 0300 06/26/18 0552  BP:  (!) 176/82 (!) 177/81 (!) 175/77  Pulse:  (!) 59 60 60  Resp:  (!) 9 20 18   Temp:    97.7 F (36.5 C)  TempSrc:    Oral  SpO2:  98% 93% 95%  Weight: 217 lb (98.4 kg)   215 lb 4.8 oz (97.7 kg)  Height: 5\' 9"  (1.753 m)   5\' 9"  (1.753 m)   No intake or output data in the 24 hours ending 06/26/18 0913 Filed Weights   06/26/18 0226 06/26/18 0552  Weight: 217 lb (98.4 kg) 215 lb 4.8 oz (97.7 kg)   Body mass index is 31.79 kg/m.   Physical Exam: General: Well developed, well nourished, in no acute distress. Head: Normocephalic, atraumatic, sclera non-icteric, no xanthomas, nares without discharge.  Neck: Negative for carotid bruits. JVD not elevated. Lungs: Clear bilaterally to auscultation without wheezes, rales, or rhonchi. Breathing is unlabored. Heart: RRR with S1 S2. I/VI systolic murmur at the apex, no rubs, or gallops appreciated. Abdomen: Soft, non-tender, non-distended with normoactive bowel sounds. No hepatomegaly. No rebound/guarding. No obvious abdominal masses. Msk:  Strength and tone appear normal for age. Moderate swelling of the left upper extremity. Extremities: No clubbing or cyanosis. No edema. Distal pedal pulses are 2+ and equal bilaterally. Neuro: Alert and oriented X 3. No facial asymmetry. No focal deficit. Moves all extremities  spontaneously. Psych:  Responds to questions appropriately with a normal affect.   EKG:  The EKG was personally reviewed and demonstrates: AV paced, 62 bpm Telemetry:  Telemetry was personally reviewed and demonstrates: AV paced  Weights: Filed Weights   06/26/18 0226 06/26/18 0552  Weight: 217 lb (98.4 kg) 215 lb 4.8 oz (97.7 kg)    Relevant CV Studies:  Echo 04/04/2018: Study Conclusions  - Left ventricle: The cavity size was normal. Systolic function was   moderately reduced. The estimated ejection fraction was in the   range of 35% to 40%. Diffuse hypokinesis. Hypokinesis of the   apical myocardium. Hypokinesis of the anteroseptal myocardium.   Hypokinesis of the anterior myocardium. Doppler parameters are   consistent with abnormal left ventricular relaxation (grade 1   diastolic dysfunction). - Mitral valve: There was mild regurgitation. - Left atrium: The atrium was mildly dilated. - Right ventricle: Pacer wire or catheter noted in right ventricle.   Systolic function was normal. - Pulmonary arteries: Systolic pressure was within the normal   range.   LHC 06/13/2018: Diagnostic   Dominance: Left  Left Main  Ost LM lesion 30% stenosed  Ost LM lesion is 30% stenosed.  Left Anterior Descending  Ost LAD lesion 30% stenosed  Ost LAD lesion is 30% stenosed.  Prox LAD to Mid LAD lesion 99% stenosed  Prox LAD to Mid LAD lesion is 99% stenosed. The lesion is moderately calcified.  First Diagonal Branch  Vessel is large in size.  Ost 1st Diag to 1st Diag lesion 90% stenosed  Ost 1st Diag to 1st Diag lesion is  90% stenosed.  Left Circumflex  Ost Cx to Prox Cx lesion 80% stenosed  Ost Cx to Prox Cx lesion is 80% stenosed. The lesion is ulcerative. The lesion is moderately calcified.  Prox Cx to Mid Cx lesion 50% stenosed with side branch in Ost 2nd Mrg 50% stenosed  Prox Cx to Mid Cx lesion is 50% stenosed with 50% stenosed side branch in Ost 2nd Mrg.  Left Posterior  Descending Artery  There is moderate disease in the vessel.  Right Coronary Artery  Vessel is small. There is severe diffuse disease throughout the vessel.  Intervention   No interventions have been documented.  Wall Motion   Resting               Left Heart   Left Ventricle The left ventricle is mildly dilated. There is moderate left ventricular systolic dysfunction. LV end diastolic pressure is mildly elevated. The left ventricular ejection fraction is 35-45% by visual estimate. There are LV function abnormalities due to segmental dysfunction.  Coronary Diagrams   Diagnostic Diagram         Conclusion     Ost Cx to Prox Cx lesion is 80% stenosed.  Prox LAD to Mid LAD lesion is 99% stenosed.  Ost 1st Diag to 1st Diag lesion is 90% stenosed.  Ost LAD lesion is 30% stenosed.  Prox Cx to Mid Cx lesion is 50% stenosed with 50% stenosed side branch in Ost 2nd Mrg.  Ost LM lesion is 30% stenosed.  There is moderate left ventricular systolic dysfunction.  The left ventricular ejection fraction is 35-45% by visual estimate.  LV end diastolic pressure is mildly elevated.   1.  Left dominant coronary arteries with severe heavily calcified three-vessel coronary artery disease.  Subtotal occlusion of the mid LAD.  Ulcerated plaque in the proximal left circumflex.  The RCA is a small nondominant and diffusely diseased. 2.  Moderately reduced LV systolic function with an EF of 35 to 40% with apical akinesis.  An organized thrombus at the apex cannot be completely excluded. 3.  Mildly elevated left ventricular end-diastolic pressure at 18 mmHg.   Recommendations: Given severe calcified and diffuse disease with cardiomyopathy, I think the best option is CABG.    Laboratory Data:  Chemistry Recent Labs  Lab 06/26/18 0223  NA 141  K 4.1  CL 104  CO2 31  GLUCOSE 142*  BUN 29*  CREATININE 1.36*  CALCIUM 9.0  GFRNONAA 49*  GFRAA 57*  ANIONGAP 6    No results  for input(s): PROT, ALBUMIN, AST, ALT, ALKPHOS, BILITOT in the last 168 hours. Hematology Recent Labs  Lab 06/26/18 0223  WBC 9.8  RBC 4.17*  HGB 13.7  HCT 40.1  MCV 96.1  MCH 32.9  MCHC 34.3  RDW 13.4  PLT 193   Cardiac Enzymes Recent Labs  Lab 06/26/18 0223 06/26/18 0605  TROPONINI <0.03 <0.03   No results for input(s): TROPIPOC in the last 168 hours.  BNPNo results for input(s): BNP, PROBNP in the last 168 hours.  DDimer No results for input(s): DDIMER in the last 168 hours.  Radiology/Studies:  Dg Chest 2 View  Result Date: 06/26/2018 IMPRESSION: No active cardiopulmonary disease. Electronically Signed   By: Ulyses Jarred M.D.   On: 06/26/2018 02:57   Ct Head Wo Contrast  Result Date: 06/26/2018 IMPRESSION: Normal aging brain. Electronically Signed   By: Ulyses Jarred M.D.   On: 06/26/2018 04:05   Ct Angio Chest Pe W And/or Wo  Contrast  Result Date: 06/26/2018 IMPRESSION: 1. No pulmonary embolus or other acute thoracic abnormality. 2.  Aortic Atherosclerosis (ICD10-I70.0). Electronically Signed   By: Ulyses Jarred M.D.   On: 06/26/2018 04:00    Assessment and Plan:   1. Multivessel CAD without angina: -No symptoms of chest pain or SOB prior to admission or since admission -Has ruled out -Planning for CABG as an outpatient following treatment of left upper extremity DVT -Case has been discussed with Drs. Prescott Gum and End -Patient did not have any anginal symptoms  -His presentation is consistent with vertigo  -Given there are no unstable anginal symptoms, the patient is asymptomatic at this time, and he prefers to go home, we will defer transferring to [facility] at this time and he will proceed as planned with his CABG in ~ 3-4 weeks following treatment of his DVT -He will continue ASA 81 mg daily, Lipitor, Toprol XL, and losartan   2. Chronic systolic CHF: -He does not appear to be grossly volume overloaded at this time -Continue Toprol XL, titrated as below  given HTN -Continue losartan as below -Not on spironolactone given CKD -Consider transitioning to [facility] given cardiomyopathy if renal function remains stable -Not on a standing diuretic at this time -Daily weights -Strict Is and Os  3. Complete heart block s/p PPM: -Device appears to be functioning normally on telemetry  -Followed by EP  4. Possible vertigo/nausea/vomiting: -No further symptoms since arriving on the floor -Last had vertigo-like sensation in the ED overnight -Started on meclizine  -Per IM  5. Left subclavian DVT: -Has been managed with Eliquis since 06/17/2018 -Initially, we transitioned him to heparin gtt in preparation for potential CABG -However, upon interviewing the patient, his symptoms strongly indicate vertigo and there have been no anginal symptoms -We will place him back on Eliquis 5 mg bid (he was initially placed on DVT dose of 10 mg bid x 7 days) -He will need repeat left upper extremity ultrasound as an outpatient in ~ 3-4 weeks  6. CKD stage III: -Renal function at his ~ baseline -Remains on losartan -Monitor   7. PVCs: -Felt to be PPM -induced -Occasional PVC noted on telemetry -Check magnesium with recommendation to replete to goal > 2.0 -Check TSH, prior abnormal  8. HTN: -Blood pressure elevated from the 336P to 224S systolic -Continue losartan 50 mg daily (will not escalate given CKD) -Increase Toprol XL to 50 mg daily (PPM) -Avoid CCB given cardiomyopathy   9. Asthma: -Appears stable -Per IM   For questions or updates, please contact [place] Please consult [email or web address] for contact info under Cardiology/STEMI.   Signed, Christell Faith, PA-C [place] Pager: [phone] 06/26/2018, 9:13 AM

## 2018-06-26 NOTE — ED Triage Notes (Signed)
Pt to ED via EMS from home c/o dizziness, vomiting, and being pale around 0130 tonight.  Pt with cardiac hx and recently started on eliquis.  Has denies CP or SOB since this started, has pacemaker with paced rhythm.  EMS vitals 153 CBG, 98% RA.  Dr. Owens Shark at bedside upon EMS arrival.

## 2018-06-26 NOTE — Discharge Summary (Signed)
[facility] NAME: Sean [name]    MR#:  [id number]  DATE OF BIRTH:  1941-06-05  DATE OF ADMISSION:  06/26/2018 ADMITTING PHYSICIAN: Arta Silence, MD  DATE OF DISCHARGE: 06/26/2018  PRIMARY CARE PHYSICIAN: [name], MD    ADMISSION DIAGNOSIS:  Chest Pain  DISCHARGE DIAGNOSIS:  Active Problems:   Vertigo   SECONDARY DIAGNOSIS:   Past Medical History:  Diagnosis Date  . Asthma   . CAD (coronary artery disease)    a. LHC 7/19: ostLM 30%, ostLAD 30%, p-mLAD 99% mod calcified, ostD1 90%, ost-pLCx 80% ulcerative & mod calcified, p-mLCx 50%, OM2 50%, mod dz LPDA, RCA small w/ diffuse dz throughout; b. recommendation of CABG  . Chronic systolic CHF (congestive heart failure) (HCC)    a. EF 35-40%, diffuse HK with HK of the apical, anteroseptal, and anterior myocardium, Gr1DD, mild MR, mildly dilated LA, pacer wire noted in the RV with nl RVSF, PASP nl  . CKD (chronic kidney disease), stage III ([place])   . Deep vein thrombosis (DVT) of upper extremity ([place])    a. DVT of left subclavian dx'd 06/17/2018; b. Eliquis  . History of complete heart block    a. s/p MDT PPM 05/2017  . Hypertension   . Obesity   . PVC's (premature ventricular contractions)    a. PPM-induced    HOSPITAL COURSE:  77 year old male status post recent cardiac heart cath showing severe coronary artery disease with recommendations for CABG, chronic systolic heart failure, complete heart block status post PPM and left subclavian DVT on anticoagulation since July 29 who presented to the emergency room with fillings of the room spinning.  1.  Vertigo: Patient has had no symptoms since admission.  Patient ambulated without any symptoms.  Patient tolerated diet.  2.  Multivessel CAD without angina: Patient ruled out for ACS.  Patient has outpatient follow-up for planned CABG in 3 to 4 weeks.  He will continue aspirin, Lipitor, metoprolol.  3.  Chronic systolic  heart failure without signs of exacerbation Patient not on Aldactone given CKD He will continue losartan and TOPROLOL.  4.  Recent left subclavian DVT: Patient will continue on Eliquis.   5.  Chronic kidney disease stage III: Creatinine is at baseline 6.  Essential hypertension: Toprol dose was increased to 50 mg daily as per cardiology.  He will continue on losartan.    DISCHARGE CONDITIONS AND DIET:  Stable Cardiac diet  CONSULTS OBTAINED:  Treatment Team:  Arta Silence, MD End, Harrell Gave, MD  DRUG ALLERGIES:   Allergies  Allergen Reactions  . Azithromycin Hives  . Erythromycin Hives    DISCHARGE MEDICATIONS:   Allergies as of 06/26/2018      Reactions   Azithromycin Hives   Erythromycin Hives      Medication List    TAKE these medications   albuterol 108 (90 Base) MCG/ACT inhaler Commonly known as:  PROVENTIL HFA;VENTOLIN HFA Inhale 1-2 puffs into the lungs every 4 (four) hours as needed for wheezing or shortness of breath.   allopurinol 100 MG tablet Commonly known as:  ZYLOPRIM Take 100 mg by mouth 2 (two) times daily.   apixaban 5 MG Tabs tablet Commonly known as:  ELIQUIS 10 mg (two tablets) twice daily for 7 days followed by 5 mg (one tablet) twice daily until follow up.   aspirin EC 81 MG tablet Take 81 mg by mouth daily.   atorvastatin 40 MG tablet Commonly known as:  LIPITOR Take 1 tablet (40 mg total) by mouth daily.   Fluticasone-Salmeterol 100-50 MCG/DOSE Aepb Commonly known as:  ADVAIR Inhale 2 puffs into the lungs daily as needed.   losartan 50 MG tablet Commonly known as:  COZAAR Take 1 tablet (50 mg total) by mouth daily.   metoprolol succinate 50 MG 24 hr tablet Commonly known as:  TOPROL-XL Take 1 tablet (50 mg total) by mouth daily. Take with or immediately following a meal. Start taking on:  06/27/2018 What changed:    medication strength  how much to take  additional instructions   multivitamin tablet Take 1  tablet by mouth daily.   NON FORMULARY Take 1 tablet by mouth daily. otc prostate multivitamin         Today   CHIEF COMPLAINT:  Doing well ambuated without issues   VITAL SIGNS:  Blood pressure 135/71, pulse (!) 59, temperature 98.1 F (36.7 C), temperature source Oral, resp. rate 18, height 5\' 9"  (1.753 m), weight 215 lb 4.8 oz (97.7 kg), SpO2 97 %.   REVIEW OF SYSTEMS:  Review of Systems  Constitutional: Negative.  Negative for chills, fever and malaise/fatigue.  HENT: Negative.  Negative for ear discharge, ear pain, hearing loss, nosebleeds and sore throat.   Eyes: Negative.  Negative for blurred vision and pain.  Respiratory: Negative.  Negative for cough, hemoptysis, shortness of breath and wheezing.   Cardiovascular: Negative.  Negative for chest pain, palpitations and leg swelling.  Gastrointestinal: Negative.  Negative for abdominal pain, blood in stool, diarrhea, nausea and vomiting.  Genitourinary: Negative.  Negative for dysuria.  Musculoskeletal: Negative.  Negative for back pain.  Skin: Negative.   Neurological: Negative for dizziness, tremors, speech change, focal weakness, seizures and headaches.  Endo/Heme/Allergies: Negative.  Does not bruise/bleed easily.  Psychiatric/Behavioral: Negative.  Negative for depression, hallucinations and suicidal ideas.     PHYSICAL EXAMINATION:  GENERAL:  77 y.o.-year-old patient lying in the bed with no acute distress.  NECK:  Supple, no jugular venous distention. No thyroid enlargement, no tenderness.  LUNGS: Normal breath sounds bilaterally, no wheezing, rales,rhonchi  No use of accessory muscles of respiration.  CARDIOVASCULAR: S1, S2 normal. No murmurs, rubs, or gallops.  ABDOMEN: Soft, non-tender, non-distended. Bowel sounds present. No organomegaly or mass.  EXTREMITIES: No pedal edema, cyanosis, or clubbing.  PSYCHIATRIC: The patient is alert and oriented x 3.  SKIN: No obvious rash, lesion, or ulcer.   DATA  REVIEW:   CBC Recent Labs  Lab 06/26/18 0223  WBC 9.8  HGB 13.7  HCT 40.1  PLT 193    Chemistries  Recent Labs  Lab 06/26/18 0223 06/26/18 0605  NA 141  --   K 4.1  --   CL 104  --   CO2 31  --   GLUCOSE 142*  --   BUN 29*  --   CREATININE 1.36*  --   CALCIUM 9.0  --   MG  --  2.1  AST  --  25  ALT  --  16  ALKPHOS  --  38  BILITOT  --  1.3*    Cardiac Enzymes Recent Labs  Lab 06/26/18 0223 06/26/18 0605 06/26/18 0901  TROPONINI <0.03 <0.03 <0.03    Microbiology Results  @[email or web address] @  RADIOLOGY:  Dg Chest 2 View  Result Date: 06/26/2018 CLINICAL DATA:  Dizziness and vomiting EXAM: CHEST - 2 VIEW COMPARISON:  06/17/2018 FINDINGS: There is a leftchest wall pacemakerwith leads projecting within the right atrium and right  ventricle. The heart size and mediastinal contours are within normal limits. Both lungs are clear. The visualized skeletal structures are unremarkable. IMPRESSION: No active cardiopulmonary disease. Electronically Signed   By: Ulyses Jarred M.D.   On: 06/26/2018 02:57   Ct Head Wo Contrast  Result Date: 06/26/2018 CLINICAL DATA:  Dizziness.  Anti coagulation. EXAM: CT HEAD WITHOUT CONTRAST TECHNIQUE: Contiguous axial images were obtained from the base of the skull through the vertex without intravenous contrast. COMPARISON:  Head CT 08/13/2014 FINDINGS: Brain: There is no mass, hemorrhage or extra-axial collection. The size and configuration of the ventricles and extra-axial CSF spaces are normal. There is no acute or chronic infarction. The brain parenchyma is normal. Vascular: No abnormal hyperdensity of the major intracranial arteries or dural venous sinuses. No intracranial atherosclerosis. Skull: The visualized skull base, calvarium and extracranial soft tissues are normal. Sinuses/Orbits: No fluid levels or advanced mucosal thickening of the visualized paranasal sinuses. No mastoid or middle ear effusion. The orbits are normal. IMPRESSION: Normal  aging brain. Electronically Signed   By: Ulyses Jarred M.D.   On: 06/26/2018 04:05   Ct Angio Chest Pe W And/or Wo Contrast  Result Date: 06/26/2018 CLINICAL DATA:  Chest pain.  Left upper extremity DVT. EXAM: CT ANGIOGRAPHY CHEST WITH CONTRAST TECHNIQUE: Multidetector CT imaging of the chest was performed using the standard protocol during bolus administration of intravenous contrast. Multiplanar CT image reconstructions and MIPs were obtained to evaluate the vascular anatomy. CONTRAST:  4mL ISOVUE-370 IOPAMIDOL (ISOVUE-370) INJECTION 76% COMPARISON:  Chest CT 05/29/2018 FINDINGS: Cardiovascular: --Pulmonary arteries: Contrast injection is sufficient to demonstrate satisfactory opacification of the pulmonary arteries to the segmental level. There is no pulmonary embolus. The main pulmonary artery is within normal limits for size. --Aorta: Limited opacification of the aorta due to bolus timing optimization for the pulmonary arteries. Conventional 3 vessel aortic branching pattern. The aortic course and caliber are normal. There is mild aortic atherosclerosis. --Heart: Normal size. No pericardial effusion. Mediastinum/Nodes: No mediastinal, hilar or axillary lymphadenopathy. The visualized thyroid and thoracic esophageal course are unremarkable. Lungs/Pleura: No pulmonary nodules or masses. No pleural effusion or pneumothorax. No focal airspace consolidation. No focal pleural abnormality. Upper Abdomen: Contrast bolus timing is not optimized for evaluation of the abdominal organs. Within this limitation, the visualized organs of the upper abdomen are normal. Musculoskeletal: No chest wall abnormality. No acute or significant osseous findings. Review of the MIP images confirms the above findings. IMPRESSION: 1. No pulmonary embolus or other acute thoracic abnormality. 2.  Aortic Atherosclerosis (ICD10-I70.0). Electronically Signed   By: Ulyses Jarred M.D.   On: 06/26/2018 04:00      Allergies as of 06/26/2018       Reactions   Azithromycin Hives   Erythromycin Hives      Medication List    TAKE these medications   albuterol 108 (90 Base) MCG/ACT inhaler Commonly known as:  PROVENTIL HFA;VENTOLIN HFA Inhale 1-2 puffs into the lungs every 4 (four) hours as needed for wheezing or shortness of breath.   allopurinol 100 MG tablet Commonly known as:  ZYLOPRIM Take 100 mg by mouth 2 (two) times daily.   apixaban 5 MG Tabs tablet Commonly known as:  ELIQUIS 10 mg (two tablets) twice daily for 7 days followed by 5 mg (one tablet) twice daily until follow up.   aspirin EC 81 MG tablet Take 81 mg by mouth daily.   atorvastatin 40 MG tablet Commonly known as:  LIPITOR Take 1 tablet (40 mg  total) by mouth daily.   Fluticasone-Salmeterol 100-50 MCG/DOSE Aepb Commonly known as:  ADVAIR Inhale 2 puffs into the lungs daily as needed.   losartan 50 MG tablet Commonly known as:  COZAAR Take 1 tablet (50 mg total) by mouth daily.   metoprolol succinate 50 MG 24 hr tablet Commonly known as:  TOPROL-XL Take 1 tablet (50 mg total) by mouth daily. Take with or immediately following a meal. Start taking on:  06/27/2018 What changed:    medication strength  how much to take  additional instructions   multivitamin tablet Take 1 tablet by mouth daily.   NON FORMULARY Take 1 tablet by mouth daily. otc prostate multivitamin          Management plans discussed with the patient and he is in agreement. Stable for discharge home  Patient should follow up with pcp and cardiology  CODE STATUS:     Code Status Orders  (From admission, onward)        Start     Ordered   06/26/18 0555  Full code  Continuous     06/26/18 0554    Code Status History    Date Active Date Inactive Code Status Order ID Comments User Context   06/13/2018 1000 06/13/2018 1524 Full Code [id number]  Wellington Hampshire, MD Inpatient   06/11/2017 1637 06/12/2017 1458 Full Code [id number]  Evans Lance, MD Inpatient     Advance Directive Documentation     Most Recent Value  Type of Advance Directive  Healthcare Power of Attorney  Pre-existing out of facility DNR order (yellow form or pink MOST form)  -  "MOST" Form in Place?  -      TOTAL TIME TAKING CARE OF THIS PATIENT: 39 minutes.    Note: This dictation was prepared with Dragon dictation along with smaller phrase technology. Any transcriptional errors that result from this process are unintentional.  Laurelyn Terrero M.D on 06/26/2018 at 3:09 PM  Between 7am to 6pm - Pager - [phone] After 6pm go to [email or web address] - password [email or web address] [facility]  Office  [phone]  CC: Primary care physician; [name], MD

## 2018-07-02 ENCOUNTER — Encounter: Payer: Self-pay | Admitting: Internal Medicine

## 2018-07-02 ENCOUNTER — Ambulatory Visit (INDEPENDENT_AMBULATORY_CARE_PROVIDER_SITE_OTHER): Payer: PPO | Admitting: Internal Medicine

## 2018-07-02 VITALS — BP 128/60 | HR 63 | Ht 66.5 in | Wt 215.8 lb

## 2018-07-02 DIAGNOSIS — I442 Atrioventricular block, complete: Secondary | ICD-10-CM | POA: Diagnosis not present

## 2018-07-02 DIAGNOSIS — I428 Other cardiomyopathies: Secondary | ICD-10-CM

## 2018-07-02 DIAGNOSIS — Z950 Presence of cardiac pacemaker: Secondary | ICD-10-CM

## 2018-07-02 DIAGNOSIS — I493 Ventricular premature depolarization: Secondary | ICD-10-CM | POA: Diagnosis not present

## 2018-07-02 MED ORDER — METOPROLOL SUCCINATE ER 25 MG PO TB24: 25.0000 mg | ORAL_TABLET | Freq: Every day | ORAL | 6 refills | Status: DC

## 2018-07-02 MED ORDER — LOSARTAN POTASSIUM 25 MG PO TABS: 25.0000 mg | ORAL_TABLET | Freq: Every day | ORAL | 6 refills | Status: DC

## 2018-07-02 NOTE — Patient Instructions (Addendum)
Medication Instructions: - Your physician has recommended you make the following change in your medication:   1) DECREASE cozaar (losartan) to 25 mg- take 1 tablet by mouth once daily  2) DECREASE toprol (metoprolol succinate) to 25 mg- take 1 tablet by mouth once daily  Labwork: - none ordered  Procedures/Testing: - Your physician has requested that you have an echocardiogram- early November. Echocardiography is a painless test that uses sound waves to create images of your heart. It provides your doctor with information about the size and shape of your heart and how well your heart's chambers and valves are working. This procedure takes approximately one hour. There are no restrictions for this procedure.  Follow-Up: - Your physician recommends that you schedule a follow-up appointment in: just after echo in November with Dr. Caryl Comes.   Any Additional Special Instructions Will Be Listed Below (If Applicable).     If you need a refill on your cardiac medications before your next appointment, please call your pharmacy.

## 2018-07-02 NOTE — Progress Notes (Signed)
Patient Care Team: [name], MD as PCP - General (Family Medicine)   HPI  Sean [name] is a 77 y.o. male Seen in followup for His pacing complicated lead induced PVCs initially at >9 %    No prior hx of EF assessment   DATE TEST EF   9/15 Myoview 55-65% No ischemia  10/18 Echo   50-55 %   5/19 Echo   30-35 %   7/19 CTA  Prob 3 V CAD  7/19 LHC  3V CAD    Post cath he developed a left upper extremity DVT and was started on Eliquis >> this resulted in deferral of his plan for bypass  This is now scheduled for early September.  Recent medication up titration has been associated with positional lightheadedness.  Seen intercurrently in the emergency room for vertigo.  Associated with significant systolic hypertension   Records and Results Reviewed   Past Medical History:  Diagnosis Date  . Asthma   . CAD (coronary artery disease)    a. LHC 7/19: ostLM 30%, ostLAD 30%, p-mLAD 99% mod calcified, ostD1 90%, ost-pLCx 80% ulcerative & mod calcified, p-mLCx 50%, OM2 50%, mod dz LPDA, RCA small w/ diffuse dz throughout; b. recommendation of CABG  . Chronic systolic CHF (congestive heart failure) (HCC)    a. EF 35-40%, diffuse HK with HK of the apical, anteroseptal, and anterior myocardium, Gr1DD, mild MR, mildly dilated LA, pacer wire noted in the RV with nl RVSF, PASP nl  . CKD (chronic kidney disease), stage III ([place])   . Deep vein thrombosis (DVT) of upper extremity ([place])    a. DVT of left subclavian dx'd 06/17/2018; b. Eliquis  . History of complete heart block    a. s/p MDT PPM 05/2017  . Hypertension   . Obesity   . PVC's (premature ventricular contractions)    a. PPM-induced    Past Surgical History:  Procedure Laterality Date  . BACK SURGERY    . LEFT HEART CATH AND CORONARY ANGIOGRAPHY N/A 06/13/2018   Procedure: LEFT HEART CATH AND CORONARY ANGIOGRAPHY;  Surgeon: Wellington Hampshire, MD;  Location: [facility];  Service: Cardiovascular;   Laterality: N/A;  . LEG SURGERY    . PACEMAKER IMPLANT N/A 06/11/2017   Procedure: Pacemaker Implant;  Surgeon: Evans Lance, MD;  Location: [facility];  Service: Cardiovascular;  Laterality: N/A;    Current Outpatient Medications  Medication Sig Dispense Refill  . albuterol (PROVENTIL HFA;VENTOLIN HFA) 108 (90 Base) MCG/ACT inhaler Inhale 1-2 puffs into the lungs every 4 (four) hours as needed for wheezing or shortness of breath.    . allopurinol (ZYLOPRIM) 100 MG tablet Take 100 mg by mouth 2 (two) times daily.    Marland Kitchen apixaban (ELIQUIS) 5 MG TABS tablet 10 mg (two tablets) twice daily for 7 days followed by 5 mg (one tablet) twice daily until follow up. 60 tablet 0  . aspirin EC 81 MG tablet Take 81 mg by mouth daily.    Marland Kitchen atorvastatin (LIPITOR) 40 MG tablet Take 1 tablet (40 mg total) by mouth daily. 90 tablet 3  . Fluticasone-Salmeterol (ADVAIR) 100-50 MCG/DOSE AEPB Inhale 2 puffs into the lungs daily as needed.     Marland Kitchen losartan (COZAAR) 50 MG tablet Take 1 tablet (50 mg total) by mouth daily. 30 tablet 3  . metoprolol succinate (TOPROL-XL) 50 MG 24 hr tablet Take 1 tablet (50 mg total) by mouth daily. Take with or  immediately following a meal. 30 tablet 0  . Multiple Vitamin (MULTIVITAMIN) tablet Take 1 tablet by mouth daily.    . NON FORMULARY Take 1 tablet by mouth daily. otc prostate multivitamin     No current facility-administered medications for this visit.     Allergies  Allergen Reactions  . Azithromycin Hives  . Erythromycin Hives      Review of Systems negative except from HPI and PMH  Physical Exam BP 128/60 (BP Location: Left Arm, Patient Position: Sitting, Cuff Size: Large)   Pulse 63   Ht 5' 6.5" (1.689 m)   Wt 215 lb 12.8 oz (97.9 kg)   BMI 34.31 kg/m  Well developed and nourished in no acute distress HENT normal Neck supple with JVP-flat Clear Regular rate and rhythm, no murmurs or gallops Abd-soft with active BS No Clubbing cyanosis  edema Skin-warm and dry A & Oriented  Grossly normal sensory and motor function    ECG P-synchronous/ AV  Pacing QRS 171      Assessment and  Plan   Complete heart block  Cardiomyopathy-ischemic with pending bypass  PVCs-presumably lead related now quiescient  Pacemaker-His-[employer]  Hypertension  Orthostatic hypotension    The patient's cardiomyopathy may well be ischemic.  We will plan to reassess LV function following revascularization scheduled for the next month or so.  It has been deferred because of a DVT in his left arm related to an IV at the time of his catheterization.  No systemic symptoms to suggest infection of his device.  Given his orthostatic lightheadedness and documented hypotension today, we will decrease his losartan metoprolol back both from 50--25.  We will ask him to record his blood pressure daily.  In the event that it becomes significant we higher we can add 1 of the medications back  We will plan to request Dr. PVT to place a left ventricular lead at the time of his bypass surgery.  We will reassess LV function thereafter and make a determination at that time whether to proceed with CRT upgrade.  We spent more than 50% of our >25 min visit in face to face counseling regarding the above

## 2018-07-16 ENCOUNTER — Ambulatory Visit: Payer: PPO | Admitting: Cardiovascular Disease

## 2018-07-24 ENCOUNTER — Encounter: Payer: Self-pay | Admitting: Cardiothoracic Surgery

## 2018-07-24 ENCOUNTER — Ambulatory Visit: Payer: PPO | Admitting: Cardiothoracic Surgery

## 2018-07-24 ENCOUNTER — Other Ambulatory Visit: Payer: Self-pay

## 2018-07-24 ENCOUNTER — Other Ambulatory Visit: Payer: PPO

## 2018-07-24 ENCOUNTER — Ambulatory Visit
Admission: RE | Admit: 2018-07-24 | Discharge: 2018-07-24 | Disposition: A | Discharge status: Home / Self Care | Payer: PPO | Source: Ambulatory Visit | Attending: Cardiothoracic Surgery | Admitting: Cardiothoracic Surgery

## 2018-07-24 VITALS — BP 130/75 | HR 74 | Resp 20 | Ht 66.5 in | Wt 217.0 lb

## 2018-07-24 DIAGNOSIS — I82B12 Acute embolism and thrombosis of left subclavian vein: Secondary | ICD-10-CM | POA: Diagnosis not present

## 2018-07-24 DIAGNOSIS — I82622 Acute embolism and thrombosis of deep veins of left upper extremity: Secondary | ICD-10-CM

## 2018-07-24 DIAGNOSIS — I2510 Atherosclerotic heart disease of native coronary artery without angina pectoris: Secondary | ICD-10-CM

## 2018-07-24 NOTE — Progress Notes (Signed)
PCP is [name], MD Referring Provider is Wellington Hampshire, MD  Chief Complaint  Patient presents with  . Coronary Artery Disease    3 week f/u repeat ultrasound of left arm DVT 07/24/18, further discuss surgery    HPI: Patient returns for follow-up and to discuss scheduling elective Multi-bypass surgery for ischemic cardiomyopathy.  Patient has been treated for DVT of his left upper extremity which occurred following left heart cath via left radial artery with ecchymoses severe edema and pain.  Patient has been on Eliquis.  His sample prescription is running out.  A follow-up ultrasound of his left arm shows persistent thrombus in the left subclavian and left axillary veins however with some recanalization and improved flow.  On exam the left arm is still swollen but improved.  He is doing well without angina and is walking up to a mile a day.  I do not feel comfortable proceeding with surgery until the arm shows some improvement.  Certainly venous access for surgery in the left arm will not be available at this time.  He would also be at risk for pulmonary embolus and with poor biventricular function would significantly increase the risk of surgery.  I have put the patient back on warfarin 5 mg daily and he will get an INR checked in approximately 1 week.  I will see him back in the office in 3 weeks to examine the arm and to hopefully schedule a date for surgery.  Patient understands the warfarin will need to be stopped approximately 5 days before surgery.   Past Medical History:  Diagnosis Date  . Asthma   . CAD (coronary artery disease)    a. LHC 7/19: ostLM 30%, ostLAD 30%, p-mLAD 99% mod calcified, ostD1 90%, ost-pLCx 80% ulcerative & mod calcified, p-mLCx 50%, OM2 50%, mod dz LPDA, RCA small w/ diffuse dz throughout; b. recommendation of CABG  . Chronic systolic CHF (congestive heart failure) (HCC)    a. EF 35-40%, diffuse HK with HK of the apical, anteroseptal, and anterior  myocardium, Gr1DD, mild MR, mildly dilated LA, pacer wire noted in the RV with nl RVSF, PASP nl  . CKD (chronic kidney disease), stage III ([place])   . Deep vein thrombosis (DVT) of upper extremity ([place])    a. DVT of left subclavian dx'd 06/17/2018; b. Eliquis  . History of complete heart block    a. s/p MDT PPM 05/2017  . Hypertension   . Obesity   . PVC's (premature ventricular contractions)    a. PPM-induced    Past Surgical History:  Procedure Laterality Date  . BACK SURGERY    . LEFT HEART CATH AND CORONARY ANGIOGRAPHY N/A 06/13/2018   Procedure: LEFT HEART CATH AND CORONARY ANGIOGRAPHY;  Surgeon: Wellington Hampshire, MD;  Location: [facility];  Service: Cardiovascular;  Laterality: N/A;  . LEG SURGERY    . PACEMAKER IMPLANT N/A 06/11/2017   Procedure: Pacemaker Implant;  Surgeon: Evans Lance, MD;  Location: [facility];  Service: Cardiovascular;  Laterality: N/A;    Family History  Problem Relation Age of Onset  . Stroke Mother   . Stroke Father   . Alcohol abuse Father   . Diabetes Brother   . Alcohol abuse Brother   . Stroke Maternal Grandfather     Social History Social History   Tobacco Use  . Smoking status: Never Smoker  . Smokeless tobacco: Never Used  Substance Use Topics  . Alcohol use: Yes  Comment: occ.  . Drug use: No    Current Outpatient Medications  Medication Sig Dispense Refill  . albuterol (PROVENTIL HFA;VENTOLIN HFA) 108 (90 Base) MCG/ACT inhaler Inhale 1-2 puffs into the lungs every 4 (four) hours as needed for wheezing or shortness of breath.    . allopurinol (ZYLOPRIM) 100 MG tablet Take 100 mg by mouth 2 (two) times daily.    Marland Kitchen apixaban (ELIQUIS) 5 MG TABS tablet 10 mg (two tablets) twice daily for 7 days followed by 5 mg (one tablet) twice daily until follow up. 60 tablet 0  . aspirin EC 81 MG tablet Take 81 mg by mouth daily.    Marland Kitchen atorvastatin (LIPITOR) 40 MG tablet Take 1 tablet (40 mg total) by mouth daily. 90 tablet 3   . Fluticasone-Salmeterol (ADVAIR) 100-50 MCG/DOSE AEPB Inhale 2 puffs into the lungs daily as needed.     Marland Kitchen losartan (COZAAR) 25 MG tablet Take 1 tablet (25 mg total) by mouth daily. 30 tablet 6  . metoprolol succinate (TOPROL-XL) 25 MG 24 hr tablet Take 1 tablet (25 mg total) by mouth daily. 30 tablet 6  . Multiple Vitamin (MULTIVITAMIN) tablet Take 1 tablet by mouth daily.    . NON FORMULARY Take 1 tablet by mouth daily. otc prostate multivitamin     No current facility-administered medications for this visit.     Allergies  Allergen Reactions  . Azithromycin Hives  . Erythromycin Hives    Review of Systems  Weight stable No fever No edema No chest pain Left arm pain improving  BP 130/75   Pulse 74   Resp 20   Ht 5' 6.5" (1.689 m)   Wt 217 lb (98.4 kg)   SpO2 96% Comment: RA  BMI 34.50 kg/m  Physical Exam      Exam    General- alert and comfortable    Neck- no JVD, no cervical adenopathy palpable, no carotid bruit   Lungs- clear without rales, wheezes   Cor- regular rate and rhythm, no murmur , gallop   Abdomen- soft, non-tender   Extremities - warm, non-tender, minimal edema   Neuro- oriented, appropriate, no focal weakness   Diagnostic Tests: Ultrasound images reviewed showing some improvement in DVT of left upper extremity but with persistent thrombus in the left subclavian and axillary veins  Impression: Hold on scheduling elective surgery until DVT improves and left arm. Transition from Eliquis to Coumadin because of patient's inability to pay for Eliquis Plan: Return for review of the situation and to discuss scheduling surgery in 3 weeks.  He will need his INR checked at his primary care physician's office Dr. Kary Kos in [place], MD [facility] [phone]

## 2018-07-25 ENCOUNTER — Telehealth: Payer: Self-pay

## 2018-07-25 NOTE — Telephone Encounter (Signed)
Patient's DVT and Coumadin will be managed by PCP Dr. Kary Kos in [place].  Patient will be made aware of appointments and medication by nurse at PCP's office, Amy.

## 2018-07-31 ENCOUNTER — Ambulatory Visit (INDEPENDENT_AMBULATORY_CARE_PROVIDER_SITE_OTHER): Payer: PPO | Admitting: *Deleted

## 2018-07-31 DIAGNOSIS — I442 Atrioventricular block, complete: Secondary | ICD-10-CM

## 2018-07-31 NOTE — Progress Notes (Signed)
Remote pacemaker transmission.   

## 2018-08-05 DIAGNOSIS — Z7901 Long term (current) use of anticoagulants: Secondary | ICD-10-CM | POA: Diagnosis not present

## 2018-08-12 DIAGNOSIS — Z7901 Long term (current) use of anticoagulants: Secondary | ICD-10-CM | POA: Diagnosis not present

## 2018-08-14 ENCOUNTER — Other Ambulatory Visit: Payer: Self-pay

## 2018-08-14 ENCOUNTER — Encounter: Payer: Self-pay | Admitting: Cardiothoracic Surgery

## 2018-08-14 ENCOUNTER — Ambulatory Visit: Payer: PPO | Admitting: Cardiothoracic Surgery

## 2018-08-14 ENCOUNTER — Other Ambulatory Visit: Payer: Self-pay | Admitting: *Deleted

## 2018-08-14 VITALS — BP 150/98 | HR 66 | Resp 18 | Ht 66.5 in | Wt 218.8 lb

## 2018-08-14 DIAGNOSIS — I82622 Acute embolism and thrombosis of deep veins of left upper extremity: Secondary | ICD-10-CM | POA: Diagnosis not present

## 2018-08-14 DIAGNOSIS — I2510 Atherosclerotic heart disease of native coronary artery without angina pectoris: Secondary | ICD-10-CM

## 2018-08-14 DIAGNOSIS — Z01818 Encounter for other preprocedural examination: Secondary | ICD-10-CM

## 2018-08-14 LAB — CUP PACEART INCLINIC DEVICE CHECK
Battery Remaining Longevity: 95 mo
Battery Voltage: 3 V
Brady Statistic AP VP Percent: 51.77 %
Brady Statistic AP VS Percent: 0.09 %
Brady Statistic AS VP Percent: 47.31 %
Brady Statistic AS VS Percent: 0.83 %
Brady Statistic RA Percent Paced: 52 %
Brady Statistic RV Percent Paced: 99.08 %
Date Time Interrogation Session: 20190813144950
Implantable Lead Implant Date: 20180723
Implantable Lead Implant Date: 20180723
Implantable Lead Location: 753859
Implantable Lead Location: 753860
Implantable Lead Model: 3830
Implantable Lead Model: 5076
Implantable Pulse Generator Implant Date: 20180723
Lead Channel Impedance Value: 304 Ohm
Lead Channel Impedance Value: 323 Ohm
Lead Channel Impedance Value: 475 Ohm
Lead Channel Impedance Value: 494 Ohm
Lead Channel Pacing Threshold Amplitude: 0.5 V
Lead Channel Pacing Threshold Amplitude: 1 V
Lead Channel Pacing Threshold Pulse Width: 0.4 ms
Lead Channel Pacing Threshold Pulse Width: 1 ms
Lead Channel Sensing Intrinsic Amplitude: 3.5 mV
Lead Channel Setting Pacing Amplitude: 2 V
Lead Channel Setting Pacing Amplitude: 2.5 V
Lead Channel Setting Pacing Pulse Width: 1 ms
Lead Channel Setting Sensing Sensitivity: 2 mV

## 2018-08-14 NOTE — Progress Notes (Signed)
PCP is [name], MD Referring Provider is Wellington Hampshire, MD  Chief Complaint  Patient presents with  . Coronary Artery Disease    3 week f/u further discuss surgery  INITIAL CONSULT Patient examined, images of coronary angiogram and 2D echo-cardiogram and cardiac CT scan personally reviewed and counseled with patient and wife. 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 one year ago the patient was diagnosed with complete heart block and underwent urgent transvenous pacemaker placement.  At that time his EF was 50-55%.  After follow-up echo showed reduction LV function he underwent a cardiac CT scan which showed evidence of coronary artery disease followed by cardiac catheterization.  The right coronary is small and diffusely diseased and probably not graftable.  The LAD diagonal and circumflex have high-grade stenoses 80 to 90% and appear to be adequate targets.  LVEDP is 20.  Patient is in sinus rhythm.  5 days ago just after cardiac catheterization via right radial artery the patient developed swelling ecchymoses and pain of his left hand and arm up to the axilla.  Ultrasound demonstrated thrombosis of the left subclavian vein and left axillary vein and left brachial vein.  He was started on Eliquis approximately 4 5 days ago.  The arm remains severely swollen and ecchymotic.  The patient has chronic lung disease from asthma and is very sensitive to flower scents  and most plants.  He uses inhalers as needed.  He has not had PFTs.  Past Medical History:  Diagnosis Date  . Asthma   . CAD (coronary artery disease)    a. LHC 7/19: ostLM 30%, ostLAD 30%, p-mLAD 99% mod calcified, ostD1 90%, ost-pLCx 80% ulcerative & mod calcified, p-mLCx 50%, OM2 50%, mod dz LPDA, RCA small w/ diffuse dz throughout; b. recommendation of CABG  . Chronic systolic CHF  (congestive heart failure) (HCC)    a. EF 35-40%, diffuse HK with HK of the apical, anteroseptal, and anterior myocardium, Gr1DD, mild MR, mildly dilated LA, pacer wire noted in the RV with nl RVSF, PASP nl  . CKD (chronic kidney disease), stage III ([place])   . 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 DVT of left subclavian dx'd 06/17/2018; b. Eliquis  . History of complete heart block    a. s/p MDT PPM 05/2017  . Hypertension   . Obesity   . PVC's (premature ventricular contractions)    a. PPM-induced    Past Surgical History:  Procedure Laterality Date  . BACK SURGERY    . LEFT HEART CATH AND CORONARY ANGIOGRAPHY N/A 06/13/2018   Procedure: LEFT HEART CATH AND CORONARY ANGIOGRAPHY;  Surgeon: Wellington Hampshire, MD;  Location: [facility];  Service: Cardiovascular;  Laterality: N/A;  . LEG SURGERY    . PACEMAKER IMPLANT N/A 06/11/2017   Procedure: Pacemaker Implant;  Surgeon: Evans Lance, MD;  Location: [facility];  Service: Cardiovascular;  Laterality: N/A;    Family History  Problem Relation Age of Onset  . Stroke Mother   . Stroke Father   . Alcohol abuse Father   . Diabetes Brother   . Alcohol abuse Brother   . Stroke Maternal Grandfather     Social History Social History   Tobacco Use  . Smoking status: Never Smoker  . Smokeless tobacco: Never Used  Substance Use Topics  . Alcohol use:  Yes    Comment: occ.  . Drug use: No    Current Outpatient Medications  Medication Sig Dispense Refill  . albuterol (PROVENTIL HFA;VENTOLIN HFA) 108 (90 Base) MCG/ACT inhaler Inhale 1-2 puffs into the lungs every 4 (four) hours as needed for wheezing or shortness of breath.    . allopurinol (ZYLOPRIM) 100 MG tablet Take 100 mg by mouth 2 (two) times daily.    Marland Kitchen aspirin EC 81 MG tablet Take 81 mg by mouth daily.    Marland Kitchen atorvastatin (LIPITOR) 40 MG tablet Take 1 tablet (40 mg total) by mouth daily. 90 tablet 3  . Fluticasone-Salmeterol (ADVAIR) 100-50  MCG/DOSE AEPB Inhale 2 puffs into the lungs daily as needed.     Marland Kitchen losartan (COZAAR) 25 MG tablet Take 1 tablet (25 mg total) by mouth daily. 30 tablet 6  . metoprolol succinate (TOPROL-XL) 25 MG 24 hr tablet Take 1 tablet (25 mg total) by mouth daily. 30 tablet 6  . Multiple Vitamin (MULTIVITAMIN) tablet Take 1 tablet by mouth daily.    . NON FORMULARY Take 1 tablet by mouth daily. otc prostate multivitamin    . warfarin (COUMADIN) 5 MG tablet Take 5 mg by mouth one time only at 6 PM.     No current facility-administered medications for this visit.     Allergies  Allergen Reactions  . Azithromycin Hives  . Erythromycin Hives    Review of Systems                      Review of Systems :  [ y ] = yes, [  ] = no        General :  Weight gain [   ]    Weight loss  [   ]  Fatigue [  ]  Fever [  ]  Chills  [  ]                                          HEENT    Headache [  ]  Dizziness [  ]  Blurred vision [  ] Glaucoma  [  ]                          Nosebleeds [  ] Painful or loose teeth Blue.Reese  ]        Cardiac :  Chest pain/ pressure [  ]  Resting SOB [  ] exertional SOB [ y ]                        Orthopnea [  ]  Pedal edema  [  ]  Palpitations [  ] Syncope/presyncope [ ]                         Paroxysmal nocturnal dyspnea [  ]         Pulmonary : cough [ y ]  wheezing Blue.Reese  ]  Hemoptysis [  ] Sputum Blue.Reese  ] Snoring [  ]                              Pneumothorax [  ]  Sleep apnea [  ]  GI : Vomiting [  ]  Dysphagia [  ]  Melena  [  ]  Abdominal pain [  ] BRBPR [  ]              Heart burn [  ]  Constipation [  ] Diarrhea  [  ] Colonoscopy [   ]        GU : Hematuria [  ]  Dysuria [  ]  Nocturia [  ] UTI's [  ]        Vascular : Claudication [  ]  Rest pain [  ]  DVT [  ] Vein stripping [  ] leg ulcers [  ]                          TIA [  ] Stroke [  ]  Varicose veins [  ]        NEURO :  Headaches  [  ] Seizures [  ] Vision changes [  ] Paresthesias [  ]                                                Musculoskeletal :  Arthritis [  ] Gout  [  ]  Back pain [  ]  Joint pain [  ] left arm swelling and pain from left subclavian DVT        Skin :  Rash [  ]  Melanoma [  ] Sores [  ]        Heme : Bleeding problems [  ]Clotting Disorders [  ] Anemia [  ]Blood Transfusion [ ]         Endocrine : Diabetes [  ] Heat or Cold intolerance [  ] Polyuria [  ]excessive thirst [ ]         Psych : Depression [  ]  Anxiety [  ]  Psych hospitalizations [  ] Memory change [  ]                                                                            BP (!) 150/98 (BP Location: Right Arm, Patient Position: Sitting, Cuff Size: Normal)   Pulse 66   Resp 18   Ht 5' 6.5" (1.689 m)   Wt 218 lb 12.8 oz (99.2 kg)   SpO2 95% Comment: RA  BMI 34.79 kg/m  Physical Exam     Physical Exam  General: Overweight but very well-developed 77 year old male HEENT: Normocephalic pupils equal , dentition adequate but with missing teeth Neck: Supple without JVD, adenopathy, or bruit Chest: Clear to auscultation, symmetrical breath sounds, no rhonchi, no tenderness             or deformity Cardiovascular: Regular rate and rhythm, no murmur, no gallop, peripheral pulses             palpable in all extremities Abdomen:  Soft, nontender, no palpable mass or organomegaly Extremities: Warm, well-perfused, no clubbing cyanosis edema or tenderness, minimal  venous stasis changes of the legs but significant swelling and ecchymoses of the left arm up to the axilla               Rectal/GU: Deferred Neuro: Grossly non--focal and symmetrical throughout Skin: Clean and dry without rash or ulceration   Office visit  September 25 Severe three-vessel coronary artery disease with moderate LV dysfunction DVT of left subclavian vein, left axillary vein Ultrasound of arm performed today shows some recanalization and flow through the subclavian and axillary veins.  Clinically the swelling is now  improved.  I discussed the procedure of CABG in detail with the patient and his wife including the inspected benefits alternatives and risks. Dr. Adam Phenix, the patient's cardiologist wishes a left ventricular lead placed at the time of CABG in case his LV function does not improve after CABG  and resynchronization therapy would potentially help the patient.  Patient will stop his Coumadin 1 week before the surgery.  He will need a echocardiogram repeated prior to surgery during his preop visit since his last one was over 3 months ago.   Dahlia Byes MD [facility] [phone]

## 2018-08-15 ENCOUNTER — Encounter: Payer: Self-pay | Admitting: *Deleted

## 2018-08-15 ENCOUNTER — Other Ambulatory Visit: Payer: Self-pay | Admitting: *Deleted

## 2018-08-15 DIAGNOSIS — I509 Heart failure, unspecified: Secondary | ICD-10-CM

## 2018-08-15 DIAGNOSIS — I2510 Atherosclerotic heart disease of native coronary artery without angina pectoris: Secondary | ICD-10-CM

## 2018-08-16 ENCOUNTER — Ambulatory Visit (HOSPITAL_COMMUNITY)
Admission: RE | Admit: 2018-08-16 | Discharge: 2018-08-16 | Disposition: A | Discharge status: Home / Self Care | Payer: PPO | Source: Ambulatory Visit | Attending: Internal Medicine | Admitting: Internal Medicine

## 2018-08-16 DIAGNOSIS — I509 Heart failure, unspecified: Secondary | ICD-10-CM | POA: Insufficient documentation

## 2018-08-16 DIAGNOSIS — I493 Ventricular premature depolarization: Secondary | ICD-10-CM

## 2018-08-16 DIAGNOSIS — Z95810 Presence of automatic (implantable) cardiac defibrillator: Secondary | ICD-10-CM | POA: Diagnosis not present

## 2018-08-16 DIAGNOSIS — I340 Nonrheumatic mitral (valve) insufficiency: Secondary | ICD-10-CM | POA: Diagnosis not present

## 2018-08-16 DIAGNOSIS — N189 Chronic kidney disease, unspecified: Secondary | ICD-10-CM | POA: Diagnosis not present

## 2018-08-16 DIAGNOSIS — I2510 Atherosclerotic heart disease of native coronary artery without angina pectoris: Secondary | ICD-10-CM | POA: Diagnosis not present

## 2018-08-16 DIAGNOSIS — I428 Other cardiomyopathies: Secondary | ICD-10-CM | POA: Insufficient documentation

## 2018-08-16 DIAGNOSIS — I429 Cardiomyopathy, unspecified: Secondary | ICD-10-CM | POA: Insufficient documentation

## 2018-08-16 NOTE — Progress Notes (Signed)
  Echocardiogram 2D Echocardiogram has been performed.  Marybelle Killings 08/16/2018, 11:24 AM

## 2018-08-21 LAB — CUP PACEART REMOTE DEVICE CHECK
Battery Remaining Longevity: 94 mo
Battery Voltage: 3 V
Brady Statistic AP VP Percent: 50.04 %
Brady Statistic AP VS Percent: 0.08 %
Brady Statistic AS VP Percent: 48.72 %
Brady Statistic AS VS Percent: 1.16 %
Brady Statistic RA Percent Paced: 50.38 %
Brady Statistic RV Percent Paced: 98.76 %
Date Time Interrogation Session: 20190911043759
Implantable Lead Implant Date: 20180723
Implantable Lead Implant Date: 20180723
Implantable Lead Location: 753859
Implantable Lead Location: 753860
Implantable Lead Model: 3830
Implantable Lead Model: 5076
Implantable Pulse Generator Implant Date: 20180723
Lead Channel Impedance Value: 285 Ohm
Lead Channel Impedance Value: 304 Ohm
Lead Channel Impedance Value: 437 Ohm
Lead Channel Impedance Value: 475 Ohm
Lead Channel Pacing Threshold Amplitude: 0.5 V
Lead Channel Pacing Threshold Amplitude: 1.5 V
Lead Channel Pacing Threshold Pulse Width: 0.4 ms
Lead Channel Pacing Threshold Pulse Width: 0.4 ms
Lead Channel Sensing Intrinsic Amplitude: 10.875 mV
Lead Channel Sensing Intrinsic Amplitude: 13.625 mV
Lead Channel Sensing Intrinsic Amplitude: 2 mV
Lead Channel Sensing Intrinsic Amplitude: 2 mV
Lead Channel Setting Pacing Amplitude: 2 V
Lead Channel Setting Pacing Amplitude: 2.5 V
Lead Channel Setting Pacing Pulse Width: 1 ms
Lead Channel Setting Sensing Sensitivity: 2 mV

## 2018-08-21 NOTE — Pre-Procedure Instructions (Signed)
TRELON PLUSH  08/21/2018      [facility] - [place], [place] - [address] [place] [address] Phone: [phone] Fax: [phone]    Your procedure is scheduled on Oct. 7  Report to [facility] Admitting at 5:30  A.M.  Call this number if you have problems the morning of surgery:  [phone]   Remember:  Do not eat or drink after midnight.      Take these medicines the morning of surgery with A SIP OF WATER :             Albuterol inhaler if needed--bring to hospital            allopurinol (zyloprim)            advair inhaler if needed--bring to hospital            Metoprolol (toprol-xl)             7 days prior to surgery STOP taking  Aleve, Naproxen, Ibuprofen, Motrin, Advil, Goody's, BC's, all herbal medications, fish oil, and all vitamins             STOP COUMADIN PER DR. VAN TRIGT    Do not wear jewelry.  Do not wear lotions, powders, or perfumes, or deodorant.  Do not shave 48 hours prior to surgery.  Men may shave face and neck.  Do not bring valuables to the hospital.  [facility] is not responsible for any belongings or valuables.  Contacts, dentures or bridgework may not be worn into surgery.  Leave your suitcase in the car.  After surgery it may be brought to your room.  For patients admitted to the hospital, discharge time will be determined by your treatment team.  Patients discharged the day of surgery will not be allowed to drive home.    Special instructions:   [facility]- Preparing For Surgery  Before surgery, you can play an important role. Because skin is not sterile, your skin needs to be as free of germs as possible. You can reduce the number of germs on your skin by washing with CHG (chlorahexidine gluconate) Soap before surgery.  CHG is an antiseptic cleaner which kills germs and bonds with the skin to continue killing germs even after washing.    Oral Hygiene is also important to reduce your risk  of infection.  Remember - BRUSH YOUR TEETH THE MORNING OF SURGERY WITH YOUR REGULAR TOOTHPASTE  Please do not use if you have an allergy to CHG or antibacterial soaps. If your skin becomes reddened/irritated stop using the CHG.  Do not shave (including legs and underarms) for at least 48 hours prior to first CHG shower. It is OK to shave your face.  Please follow these instructions carefully.   1. Shower the NIGHT BEFORE SURGERY and the MORNING OF SURGERY with CHG.   2. If you chose to wash your hair, wash your hair first as usual with your normal shampoo.  3. After you shampoo, rinse your hair and body thoroughly to remove the shampoo.  4. Use CHG as you would any other liquid soap. You can apply CHG directly to the skin and wash gently with a scrungie or a clean washcloth.   5. Apply the CHG Soap to your body ONLY FROM THE NECK DOWN.  Do not use on open wounds or open sores. Avoid contact with your eyes, ears, mouth and genitals (private parts). Wash  Face and genitals (private parts)  with your normal soap.  6. Wash thoroughly, paying special attention to the area where your surgery will be performed.  7. Thoroughly rinse your body with warm water from the neck down.  8. DO NOT shower/wash with your normal soap after using and rinsing off the CHG Soap.  9. Pat yourself dry with a CLEAN TOWEL.  10. Wear CLEAN PAJAMAS to bed the night before surgery, wear comfortable clothes the morning of surgery  11. Place CLEAN SHEETS on your bed the night of your first shower and DO NOT SLEEP WITH PETS.    Day of Surgery:  Do not apply any deodorants/lotions.  Please wear clean clothes to the hospital/surgery center.   Remember to brush your teeth WITH YOUR REGULAR TOOTHPASTE.    Please read over the following fact sheets that you were given. Coughing and Deep Breathing, MRSA Information and Surgical Site Infection Prevention

## 2018-08-22 ENCOUNTER — Ambulatory Visit (HOSPITAL_COMMUNITY)
Admission: RE | Admit: 2018-08-22 | Discharge: 2018-08-22 | Disposition: A | Discharge status: Home / Self Care | Payer: PPO | Source: Ambulatory Visit | Attending: Cardiothoracic Surgery | Admitting: Cardiothoracic Surgery

## 2018-08-22 ENCOUNTER — Other Ambulatory Visit: Payer: Self-pay

## 2018-08-22 ENCOUNTER — Encounter (HOSPITAL_COMMUNITY)
Admission: RE | Admit: 2018-08-22 | Discharge: 2018-08-22 | Disposition: A | Discharge status: Home / Self Care | Payer: PPO | Source: Ambulatory Visit | Attending: Cardiothoracic Surgery | Admitting: Cardiothoracic Surgery

## 2018-08-22 ENCOUNTER — Encounter (HOSPITAL_COMMUNITY): Payer: Self-pay

## 2018-08-22 DIAGNOSIS — I509 Heart failure, unspecified: Secondary | ICD-10-CM

## 2018-08-22 DIAGNOSIS — I2510 Atherosclerotic heart disease of native coronary artery without angina pectoris: Secondary | ICD-10-CM

## 2018-08-22 DIAGNOSIS — I6523 Occlusion and stenosis of bilateral carotid arteries: Secondary | ICD-10-CM | POA: Diagnosis not present

## 2018-08-22 DIAGNOSIS — Z01818 Encounter for other preprocedural examination: Secondary | ICD-10-CM | POA: Insufficient documentation

## 2018-08-22 HISTORY — DX: Gastro-esophageal reflux disease without esophagitis: K21.9

## 2018-08-22 HISTORY — DX: Presence of cardiac pacemaker: Z95.0

## 2018-08-22 HISTORY — DX: Unspecified osteoarthritis, unspecified site: M19.90

## 2018-08-22 LAB — COMPREHENSIVE METABOLIC PANEL
ALT: 23 U/L (ref 0–44)
AST: 27 U/L (ref 15–41)
Albumin: 4.4 g/dL (ref 3.5–5.0)
Alkaline Phosphatase: 47 U/L (ref 38–126)
Anion gap: 8 (ref 5–15)
BUN: 23 mg/dL (ref 8–23)
CO2: 25 mmol/L (ref 22–32)
Calcium: 9.5 mg/dL (ref 8.9–10.3)
Chloride: 106 mmol/L (ref 98–111)
Creatinine, Ser: 1.39 mg/dL — ABNORMAL HIGH (ref 0.61–1.24)
GFR calc Af Amer: 55 mL/min — ABNORMAL LOW (ref >60)
GFR calc non Af Amer: 48 mL/min — ABNORMAL LOW (ref >60)
Glucose, Bld: 101 mg/dL — ABNORMAL HIGH (ref 70–99)
Potassium: 4.3 mmol/L (ref 3.5–5.1)
Sodium: 139 mmol/L (ref 135–145)
Total Bilirubin: 1.5 mg/dL — ABNORMAL HIGH (ref 0.3–1.2)
Total Protein: 6.8 g/dL (ref 6.5–8.1)

## 2018-08-22 LAB — URINALYSIS, ROUTINE W REFLEX MICROSCOPIC
Bilirubin Urine: NEGATIVE
Glucose, UA: NEGATIVE mg/dL
Hgb urine dipstick: NEGATIVE
Ketones, ur: NEGATIVE mg/dL
Leukocytes, UA: NEGATIVE
Nitrite: NEGATIVE
Protein, ur: NEGATIVE mg/dL
Specific Gravity, Urine: 1.016 (ref 1.005–1.030)
pH: 7 (ref 5.0–8.0)

## 2018-08-22 LAB — PULMONARY FUNCTION TEST
DL/VA % pred: 72 %
DL/VA: 3.26 ml/min/mmHg/L
DLCO unc % pred: 69 %
DLCO unc: 21.58 ml/min/mmHg
FEF 25-75 Post: 2.3 L/s
FEF 25-75 Pre: 0.77 L/s
FEF2575-%Change-Post: 196 %
FEF2575-%Pred-Post: 112 %
FEF2575-%Pred-Pre: 37 %
FEV1-%Change-Post: 51 %
FEV1-%Pred-Post: 91 %
FEV1-%Pred-Pre: 60 %
FEV1-Post: 2.63 L
FEV1-Pre: 1.74 L
FEV1FVC-%Change-Post: 22 %
FEV1FVC-%Pred-Pre: 72 %
FEV6-%Change-Post: 27 %
FEV6-%Pred-Post: 106 %
FEV6-%Pred-Pre: 83 %
FEV6-Post: 3.99 L
FEV6-Pre: 3.12 L
FEV6FVC-%Change-Post: 2 %
FEV6FVC-%Pred-Post: 103 %
FEV6FVC-%Pred-Pre: 100 %
FVC-%Change-Post: 23 %
FVC-%Pred-Post: 102 %
FVC-%Pred-Pre: 82 %
FVC-Post: 4.1 L
FVC-Pre: 3.31 L
Post FEV1/FVC ratio: 64 %
Post FEV6/FVC ratio: 97 %
Pre FEV1/FVC ratio: 52 %
Pre FEV6/FVC Ratio: 95 %
RV % pred: 162 %
RV: 4.11 L
TLC % pred: 116 %
TLC: 8.01 L

## 2018-08-22 LAB — PROTIME-INR
INR: 1.19
Prothrombin Time: 15 seconds (ref 11.4–15.2)

## 2018-08-22 LAB — CBC
HCT: 47.3 % (ref 39.0–52.0)
Hemoglobin: 15 g/dL (ref 13.0–17.0)
MCH: 32.2 pg (ref 26.0–34.0)
MCHC: 31.7 g/dL (ref 30.0–36.0)
MCV: 101.5 fL — ABNORMAL HIGH (ref 78.0–100.0)
Platelets: 169 K/uL (ref 150–400)
RBC: 4.66 MIL/uL (ref 4.22–5.81)
RDW: 13.2 % (ref 11.5–15.5)
WBC: 8 K/uL (ref 4.0–10.5)

## 2018-08-22 LAB — APTT: aPTT: 29 seconds (ref 24–36)

## 2018-08-22 LAB — SURGICAL PCR SCREEN
MRSA, PCR: NEGATIVE
Staphylococcus aureus: NEGATIVE

## 2018-08-22 LAB — BLOOD GAS, ARTERIAL
Acid-Base Excess: 1.7 mmol/L (ref 0.0–2.0)
Bicarbonate: 25.3 mmol/L (ref 20.0–28.0)
Drawn by: 449841
FIO2: 21
O2 Saturation: 87.3 %
Patient temperature: 98.6
pCO2 arterial: 37.3 mmHg (ref 32.0–48.0)
pH, Arterial: 7.447 (ref 7.350–7.450)
pO2, Arterial: 50.6 mmHg — ABNORMAL LOW (ref 83.0–108.0)

## 2018-08-22 LAB — ABO/RH: ABO/RH(D): A POS

## 2018-08-22 MED ORDER — ALBUTEROL SULFATE (2.5 MG/3ML) 0.083% IN NEBU
2.5000 mg | INHALATION_SOLUTION | Freq: Once | RESPIRATORY_TRACT | Status: AC
  Administered 2018-08-22: 2.5 mg via RESPIRATORY_TRACT

## 2018-08-22 NOTE — Progress Notes (Signed)
   08/22/18 1114  OBSTRUCTIVE SLEEP APNEA  Have you ever been diagnosed with sleep apnea through a sleep study? No  Do you snore loudly (loud enough to be heard through closed doors)?  0  Do you often feel tired, fatigued, or sleepy during the daytime (such as falling asleep during driving or talking to someone)? 0  Has anyone observed you stop breathing during your sleep? 0  Do you have, or are you being treated for high blood pressure? 1  BMI more than 35 kg/m2? 0  Age > 50 (1-yes) 1  Neck circumference greater than:Male 16 inches or larger, Male 17inches or larger? 1  Male Gender (Yes=1) 1  Obstructive Sleep Apnea Score 4  Score 5 or greater  Results sent to PCP

## 2018-08-22 NOTE — Pre-Procedure Instructions (Signed)
DELBERT DARLEY  08/22/2018      [facility] - [place], [place] - [address] [place] [address] Phone: [phone] Fax: [phone]    Your procedure is scheduled on  Monday, Oct. 7th   Report to [facility] at 5:30  A.M.             (posted surgery time 7:30a - 2:30p)   Call this number if you have problems the morning of surgery:  [phone]   Remember:  Do not eat any foods or drink any liquids after midnight, Sunday.    Take these medicines the morning of surgery with A SIP OF WATER :             Albuterol inhaler if needed--bring to hospital            allopurinol (zyloprim)            advair inhaler if needed--bring to hospital            Metoprolol (toprol-xl)             7 days prior to surgery STOP taking  Aleve, Naproxen, Ibuprofen, Motrin, Advil, Goody's, BC's, all herbal medications, fish oil, and all vitamins             STOP COUMADIN PER DR. VAN TRIGT    Do not wear jewelry - NO RINGS  Do not wear lotions, colognes or deodorant.              Men may shave face and neck.  Do not bring valuables to the hospital.  [facility] is not responsible for any belongings or valuables.  Contacts, dentures or bridgework may not be worn into surgery.  Leave your suitcase in the car.  After surgery it may be brought to your room.  For patients admitted to the hospital, discharge time will be determined by your treatment team.      [facility]- Preparing For Surgery  Before surgery, you can play an important role. Because skin is not sterile, your skin needs to be as free of germs as possible. You can reduce the number of germs on your skin by washing with CHG (chlorahexidine gluconate) Soap before surgery.  CHG is an antiseptic cleaner which kills germs and bonds with the skin to continue killing germs even after washing.    Oral Hygiene is also important to reduce your risk of infection.    Remember - BRUSH  YOUR TEETH THE MORNING OF SURGERY WITH YOUR REGULAR TOOTHPASTE  Please do not use if you have an allergy to CHG or antibacterial soaps. If your skin becomes reddened/irritated stop using the CHG.  Do not shave (including legs and underarms) for at least 48 hours prior to first CHG shower. It is OK to shave your face.  Please follow these instructions carefully.   1. Shower the NIGHT BEFORE SURGERY and the MORNING OF SURGERY with CHG.   2. If you chose to wash your hair, wash your hair first as usual with your normal shampoo.  3. After you shampoo, rinse your hair and body thoroughly to remove the shampoo.  4. Use CHG as you would any other liquid soap. You can apply CHG directly to the skin and wash gently with a scrungie or a clean washcloth.   5. Apply the CHG Soap to your body ONLY FROM THE NECK DOWN.  Do not use on open wounds  or open sores. Avoid contact with your eyes, ears, mouth and genitals (private parts). Wash Face and genitals (private parts)  with your normal soap.  6. Wash thoroughly, paying special attention to the area where your surgery will be performed.  7. Thoroughly rinse your body with warm water from the neck down.  8. DO NOT shower/wash with your normal soap after using and rinsing off the CHG Soap.  9. Pat yourself dry with a CLEAN TOWEL.  10. Wear CLEAN PAJAMAS to bed the night before surgery, wear comfortable clothes the morning of surgery  11. Place CLEAN SHEETS on your bed the night of your first shower and DO NOT SLEEP WITH PETS.  Day of Surgery:  Do not apply any deodorants/lotions.  Please wear clean clothes to the hospital/surgery center.   Remember to brush your teeth WITH YOUR REGULAR TOOTHPASTE.  Please read over the following fact sheets that you were given. MRSA Information and Surgical Site Infection Prevention

## 2018-08-22 NOTE — Progress Notes (Signed)
Pre-CABG testing has been completed. 1-39% ICA stenosis bilaterally.  ABI's  Right 0.82 Left 0.92  Palmar waveforms Right Palmar waveforms are obliterated with radial and ulnar compression. Left Palmar waveforms are obliterated with radial and ulnar compression.  08/22/18 10:49 AM Sean [name] RVT

## 2018-08-22 NOTE — Progress Notes (Addendum)
PCP is Dr. Irish Lack [facility] clinic)  LOV 02/2018 Ep is Drs Beckie Salts and Olin Pia.  Has medtronic pacer.  Implantable Device Programming sheet faxed to [facility]. Note sent to [employer] about DOS & time. ([phone] # [phone]) Sean [name] has taken his last dose of coumadin 9/28 per Dr. Lucianne Lei Trigt's instruction.  He was also instructed to take his meds the day before, but not the day of. (Aspirin)

## 2018-08-23 LAB — HEMOGLOBIN A1C
Hgb A1c MFr Bld: 6 % — ABNORMAL HIGH (ref 4.8–5.6)
Mean Plasma Glucose: 126 mg/dL

## 2018-08-25 MED ORDER — SODIUM CHLORIDE 0.9 % IV SOLN
1.5000 g | INTRAVENOUS | Status: AC
  Administered 2018-08-26: 1.5 g via INTRAVENOUS
  Filled 2018-08-25: qty 1.5

## 2018-08-25 MED ORDER — TRANEXAMIC ACID (OHS) PUMP PRIME SOLUTION
2.0000 mg/kg | INTRAVENOUS | Status: DC
  Filled 2018-08-25 (×2): qty 1.98

## 2018-08-25 MED ORDER — PHENYLEPHRINE HCL-NACL 20-0.9 MG/250ML-% IV SOLN
30.0000 ug/min | INTRAVENOUS | Status: AC
  Administered 2018-08-26: 20 ug/min via INTRAVENOUS
  Filled 2018-08-25: qty 250

## 2018-08-25 MED ORDER — POTASSIUM CHLORIDE 2 MEQ/ML IV SOLN
80.0000 meq | INTRAVENOUS | Status: DC
  Filled 2018-08-25: qty 40

## 2018-08-25 MED ORDER — NITROGLYCERIN IN D5W 200-5 MCG/ML-% IV SOLN
2.0000 ug/min | INTRAVENOUS | Status: DC
  Filled 2018-08-25: qty 250

## 2018-08-25 MED ORDER — PLASMA-LYTE 148 IV SOLN
INTRAVENOUS | Status: DC
  Filled 2018-08-25: qty 2.5

## 2018-08-25 MED ORDER — SODIUM CHLORIDE 0.9 % IV SOLN
750.0000 mg | INTRAVENOUS | Status: AC
  Administered 2018-08-26: 750 mg via INTRAVENOUS
  Filled 2018-08-25: qty 750

## 2018-08-25 MED ORDER — MILRINONE LACTATE IN DEXTROSE 20-5 MG/100ML-% IV SOLN
0.3000 ug/kg/min | INTRAVENOUS | Status: AC
  Administered 2018-08-26: 0.25 ug/kg/min via INTRAVENOUS
  Filled 2018-08-25: qty 100

## 2018-08-25 MED ORDER — TRANEXAMIC ACID 1000 MG/10ML IV SOLN
1.5000 mg/kg/h | INTRAVENOUS | Status: AC
  Administered 2018-08-26: 1.5 mg/kg/h via INTRAVENOUS
  Filled 2018-08-25: qty 25

## 2018-08-25 MED ORDER — EPINEPHRINE PF 1 MG/ML IJ SOLN
0.0000 ug/min | INTRAVENOUS | Status: DC
  Filled 2018-08-25: qty 4

## 2018-08-25 MED ORDER — VANCOMYCIN HCL 10 G IV SOLR
1250.0000 mg | INTRAVENOUS | Status: AC
  Administered 2018-08-26: 1250 mg via INTRAVENOUS
  Filled 2018-08-25: qty 1250

## 2018-08-25 MED ORDER — DEXMEDETOMIDINE HCL IN NACL 400 MCG/100ML IV SOLN
0.1000 ug/kg/h | INTRAVENOUS | Status: AC
  Administered 2018-08-26: 0.7 ug/kg/h via INTRAVENOUS
  Filled 2018-08-25: qty 100

## 2018-08-25 MED ORDER — SODIUM CHLORIDE 0.9 % IV SOLN
INTRAVENOUS | Status: AC
  Administered 2018-08-26: .7 [IU]/h via INTRAVENOUS
  Filled 2018-08-25: qty 1

## 2018-08-25 MED ORDER — MAGNESIUM SULFATE 50 % IJ SOLN
40.0000 meq | INTRAMUSCULAR | Status: DC
  Filled 2018-08-25 (×2): qty 9.85

## 2018-08-25 MED ORDER — DOPAMINE-DEXTROSE 3.2-5 MG/ML-% IV SOLN
0.0000 ug/kg/min | INTRAVENOUS | Status: DC
  Filled 2018-08-25: qty 250

## 2018-08-25 MED ORDER — TRANEXAMIC ACID (OHS) BOLUS VIA INFUSION
15.0000 mg/kg | INTRAVENOUS | Status: AC
  Administered 2018-08-26: 1488 mg via INTRAVENOUS
  Filled 2018-08-25: qty 1488

## 2018-08-25 MED ORDER — SODIUM CHLORIDE 0.9 % IV SOLN
INTRAVENOUS | Status: DC
  Filled 2018-08-25: qty 30

## 2018-08-25 NOTE — Anesthesia Preprocedure Evaluation (Addendum)
Anesthesia Evaluation  Patient identified by MRN, date of birth, ID band Patient awake    Reviewed: Allergy & Precautions, NPO status , Patient's Chart, lab work & pertinent test results  History of Anesthesia Complications Negative for: history of anesthetic complications  Airway Mallampati: II  TM Distance: >3 FB Neck ROM: Full    Dental  (+) Dental Advisory Given, Chipped,    Pulmonary shortness of breath, asthma ,    breath sounds clear to auscultation       Cardiovascular hypertension, Pt. on home beta blockers and Pt. on medications + CAD, +CHF and + DVT  + dysrhythmias + pacemaker  Rhythm:Regular Rate:Normal   Left subclavian DVT 05/2018  '19 TTE - Mild LVH. EF 35% to 40%. Diffuse hypokinesis. Mildly thickened MV leaflets. There was trivial MR. Normal LA and RV. Pacer wire or AICD noted in right ventricle. RA mildly dilated. Pacer wire or AICD noted in right atrium. Mild TR. PASP: 21 mm Hg.  '19 Carotid US - 1-39% b/l ICAS  '19 Cath - Ost Cx to Prox Cx lesion is 80% stenosed. Prox LAD to Mid LAD lesion is 99% stenosed. Ost 1st Diag to 1st Diag lesion is 90% stenosed. Ost LAD lesion is 30% stenosed. Prox Cx to Mid Cx lesion is 50% stenosed with 50% stenosed side branch in Ost 2nd Mrg. Ost LM lesion is 30% stenosed. There is moderate left ventricular systolic dysfunction. The left ventricular ejection fraction is 35-45% by visual estimate. LV end diastolic pressure is mildly elevated. 1.  Left dominant coronary arteries with severe heavily calcified three-vessel coronary artery disease.  Subtotal occlusion of the mid LAD.  Ulcerated plaque in the proximal left circumflex.  The RCA is a small nondominant and diffusely diseased. 2.  Moderately reduced LV systolic function with an EF of 35 to 40% with apical akinesis.  An organized thrombus at the apex cannot be completely excluded. 3.  Mildly elevated left ventricular  end-diastolic pressure at 18 mmHg.   Neuro/Psych negative neurological ROS  negative psych ROS   GI/Hepatic Neg liver ROS, GERD  Medicated and Controlled,  Endo/Other   Obesity   Renal/GU CRFRenal disease  negative genitourinary   Musculoskeletal  (+) Arthritis ,   Abdominal   Peds  Hematology negative hematology ROS (+)   Anesthesia Other Findings   Reproductive/Obstetrics                          Anesthesia Physical Anesthesia Plan  ASA: IV  Anesthesia Plan: General   Post-op Pain Management:    Induction: Intravenous  PONV Risk Score and Plan: 3 and Treatment may vary due to age or medical condition, Ondansetron and Dexamethasone  Airway Management Planned: Oral ETT  Additional Equipment: Arterial line, CVP, PA Cath, TEE and Ultrasound Guidance Line Placement  Intra-op Plan:   Post-operative Plan: Post-operative intubation/ventilation  Informed Consent: I have reviewed the patients History and Physical, chart, labs and discussed the procedure including the risks, benefits and alternatives for the proposed anesthesia with the patient or authorized representative who has indicated his/her understanding and acceptance.   Dental advisory given  Plan Discussed with: CRNA and Anesthesiologist  Anesthesia Plan Comments:        Anesthesia Quick Evaluation

## 2018-08-26 ENCOUNTER — Inpatient Hospital Stay (HOSPITAL_COMMUNITY)
Admission: RE | Disposition: A | Discharge status: Home / Self Care | Payer: Self-pay | Source: Ambulatory Visit | Attending: Cardiothoracic Surgery

## 2018-08-26 ENCOUNTER — Inpatient Hospital Stay (HOSPITAL_COMMUNITY): Payer: PPO

## 2018-08-26 ENCOUNTER — Encounter (HOSPITAL_COMMUNITY): Payer: Self-pay

## 2018-08-26 ENCOUNTER — Inpatient Hospital Stay (HOSPITAL_COMMUNITY): Payer: PPO | Admitting: Physician Assistant

## 2018-08-26 ENCOUNTER — Other Ambulatory Visit: Payer: Self-pay

## 2018-08-26 ENCOUNTER — Inpatient Hospital Stay (HOSPITAL_COMMUNITY): Payer: PPO | Admitting: Certified Registered"

## 2018-08-26 ENCOUNTER — Inpatient Hospital Stay (HOSPITAL_COMMUNITY)
Admission: RE | Admit: 2018-08-26 | Discharge: 2018-09-03 | DRG: 236 | Disposition: A | Discharge status: Home / Self Care | Payer: PPO | Source: Ambulatory Visit | Attending: Cardiothoracic Surgery | Admitting: Cardiothoracic Surgery

## 2018-08-26 DIAGNOSIS — D72829 Elevated white blood cell count, unspecified: Secondary | ICD-10-CM | POA: Diagnosis not present

## 2018-08-26 DIAGNOSIS — Z951 Presence of aortocoronary bypass graft: Secondary | ICD-10-CM | POA: Diagnosis not present

## 2018-08-26 DIAGNOSIS — Z888 Allergy status to other drugs, medicaments and biological substances status: Secondary | ICD-10-CM

## 2018-08-26 DIAGNOSIS — I4892 Unspecified atrial flutter: Secondary | ICD-10-CM | POA: Diagnosis not present

## 2018-08-26 DIAGNOSIS — D696 Thrombocytopenia, unspecified: Secondary | ICD-10-CM | POA: Diagnosis not present

## 2018-08-26 DIAGNOSIS — I5022 Chronic systolic (congestive) heart failure: Secondary | ICD-10-CM | POA: Diagnosis not present

## 2018-08-26 DIAGNOSIS — D62 Acute posthemorrhagic anemia: Secondary | ICD-10-CM | POA: Diagnosis not present

## 2018-08-26 DIAGNOSIS — K219 Gastro-esophageal reflux disease without esophagitis: Secondary | ICD-10-CM | POA: Diagnosis present

## 2018-08-26 DIAGNOSIS — Z950 Presence of cardiac pacemaker: Secondary | ICD-10-CM | POA: Diagnosis not present

## 2018-08-26 DIAGNOSIS — I4891 Unspecified atrial fibrillation: Secondary | ICD-10-CM | POA: Diagnosis not present

## 2018-08-26 DIAGNOSIS — Z86718 Personal history of other venous thrombosis and embolism: Secondary | ICD-10-CM

## 2018-08-26 DIAGNOSIS — M7989 Other specified soft tissue disorders: Secondary | ICD-10-CM | POA: Diagnosis not present

## 2018-08-26 DIAGNOSIS — Z6836 Body mass index (BMI) 36.0-36.9, adult: Secondary | ICD-10-CM | POA: Diagnosis not present

## 2018-08-26 DIAGNOSIS — R740 Nonspecific elevation of levels of transaminase and lactic acid dehydrogenase [LDH]: Secondary | ICD-10-CM | POA: Diagnosis not present

## 2018-08-26 DIAGNOSIS — I2510 Atherosclerotic heart disease of native coronary artery without angina pectoris: Principal | ICD-10-CM | POA: Diagnosis present

## 2018-08-26 DIAGNOSIS — I82B12 Acute embolism and thrombosis of left subclavian vein: Secondary | ICD-10-CM | POA: Diagnosis not present

## 2018-08-26 DIAGNOSIS — Z881 Allergy status to other antibiotic agents status: Secondary | ICD-10-CM | POA: Diagnosis not present

## 2018-08-26 DIAGNOSIS — I442 Atrioventricular block, complete: Secondary | ICD-10-CM | POA: Diagnosis present

## 2018-08-26 DIAGNOSIS — I255 Ischemic cardiomyopathy: Secondary | ICD-10-CM | POA: Diagnosis present

## 2018-08-26 DIAGNOSIS — E669 Obesity, unspecified: Secondary | ICD-10-CM | POA: Diagnosis not present

## 2018-08-26 DIAGNOSIS — I110 Hypertensive heart disease with heart failure: Secondary | ICD-10-CM | POA: Diagnosis not present

## 2018-08-26 DIAGNOSIS — Z79899 Other long term (current) drug therapy: Secondary | ICD-10-CM | POA: Diagnosis not present

## 2018-08-26 DIAGNOSIS — J449 Chronic obstructive pulmonary disease, unspecified: Secondary | ICD-10-CM | POA: Diagnosis present

## 2018-08-26 DIAGNOSIS — I130 Hypertensive heart and chronic kidney disease with heart failure and stage 1 through stage 4 chronic kidney disease, or unspecified chronic kidney disease: Secondary | ICD-10-CM | POA: Diagnosis not present

## 2018-08-26 DIAGNOSIS — L03112 Cellulitis of left axilla: Secondary | ICD-10-CM | POA: Diagnosis not present

## 2018-08-26 DIAGNOSIS — J90 Pleural effusion, not elsewhere classified: Secondary | ICD-10-CM | POA: Diagnosis not present

## 2018-08-26 DIAGNOSIS — I509 Heart failure, unspecified: Secondary | ICD-10-CM

## 2018-08-26 DIAGNOSIS — Z09 Encounter for follow-up examination after completed treatment for conditions other than malignant neoplasm: Secondary | ICD-10-CM

## 2018-08-26 DIAGNOSIS — R52 Pain, unspecified: Secondary | ICD-10-CM | POA: Diagnosis not present

## 2018-08-26 DIAGNOSIS — I081 Rheumatic disorders of both mitral and tricuspid valves: Secondary | ICD-10-CM | POA: Diagnosis not present

## 2018-08-26 DIAGNOSIS — J9811 Atelectasis: Secondary | ICD-10-CM | POA: Diagnosis not present

## 2018-08-26 HISTORY — DX: Presence of aortocoronary bypass graft: Z95.1

## 2018-08-26 HISTORY — PX: TEE WITHOUT CARDIOVERSION: SHX5443

## 2018-08-26 HISTORY — PX: CORONARY ARTERY BYPASS GRAFT: SHX141

## 2018-08-26 HISTORY — PX: EPICARDIAL PACING LEAD PLACEMENT: SHX6274

## 2018-08-26 LAB — POCT I-STAT, CHEM 8
BUN: 27 mg/dL — ABNORMAL HIGH (ref 8–23)
BUN: 22 mg/dL (ref 8–23)
BUN: 26 mg/dL — ABNORMAL HIGH (ref 8–23)
BUN: 26 mg/dL — ABNORMAL HIGH (ref 8–23)
BUN: 29 mg/dL — ABNORMAL HIGH (ref 8–23)
BUN: 29 mg/dL — ABNORMAL HIGH (ref 8–23)
Calcium, Ion: 1.26 mmol/L (ref 1.15–1.40)
Calcium, Ion: 1.01 mmol/L — ABNORMAL LOW (ref 1.15–1.40)
Calcium, Ion: 1.26 mmol/L (ref 1.15–1.40)
Calcium, Ion: 1.07 mmol/L — ABNORMAL LOW (ref 1.15–1.40)
Calcium, Ion: 1.11 mmol/L — ABNORMAL LOW (ref 1.15–1.40)
Calcium, Ion: 1.19 mmol/L (ref 1.15–1.40)
Chloride: 106 mmol/L (ref 98–111)
Chloride: 102 mmol/L (ref 98–111)
Chloride: 107 mmol/L (ref 98–111)
Chloride: 106 mmol/L (ref 98–111)
Chloride: 105 mmol/L (ref 98–111)
Chloride: 105 mmol/L (ref 98–111)
Creatinine, Ser: 1.1 mg/dL (ref 0.61–1.24)
Creatinine, Ser: 0.9 mg/dL (ref 0.61–1.24)
Creatinine, Ser: 1.1 mg/dL (ref 0.61–1.24)
Creatinine, Ser: 1.1 mg/dL (ref 0.61–1.24)
Creatinine, Ser: 1.2 mg/dL (ref 0.61–1.24)
Creatinine, Ser: 1.4 mg/dL — ABNORMAL HIGH (ref 0.61–1.24)
Glucose, Bld: 97 mg/dL (ref 70–99)
Glucose, Bld: 111 mg/dL — ABNORMAL HIGH (ref 70–99)
Glucose, Bld: 115 mg/dL — ABNORMAL HIGH (ref 70–99)
Glucose, Bld: 152 mg/dL — ABNORMAL HIGH (ref 70–99)
Glucose, Bld: 152 mg/dL — ABNORMAL HIGH (ref 70–99)
Glucose, Bld: 161 mg/dL — ABNORMAL HIGH (ref 70–99)
HCT: 34 % — ABNORMAL LOW (ref 39.0–52.0)
HCT: 26 % — ABNORMAL LOW (ref 39.0–52.0)
HCT: 31 % — ABNORMAL LOW (ref 39.0–52.0)
HCT: 26 % — ABNORMAL LOW (ref 39.0–52.0)
HCT: 29 % — ABNORMAL LOW (ref 39.0–52.0)
HCT: 30 % — ABNORMAL LOW (ref 39.0–52.0)
Hemoglobin: 11.6 g/dL — ABNORMAL LOW (ref 13.0–17.0)
Hemoglobin: 10.5 g/dL — ABNORMAL LOW (ref 13.0–17.0)
Hemoglobin: 8.8 g/dL — ABNORMAL LOW (ref 13.0–17.0)
Hemoglobin: 8.8 g/dL — ABNORMAL LOW (ref 13.0–17.0)
Hemoglobin: 9.9 g/dL — ABNORMAL LOW (ref 13.0–17.0)
Hemoglobin: 10.2 g/dL — ABNORMAL LOW (ref 13.0–17.0)
Potassium: 4 mmol/L (ref 3.5–5.1)
Potassium: 4.6 mmol/L (ref 3.5–5.1)
Potassium: 4.8 mmol/L (ref 3.5–5.1)
Potassium: 6 mmol/L — ABNORMAL HIGH (ref 3.5–5.1)
Potassium: 5.5 mmol/L — ABNORMAL HIGH (ref 3.5–5.1)
Potassium: 3.8 mmol/L (ref 3.5–5.1)
Sodium: 140 mmol/L (ref 135–145)
Sodium: 139 mmol/L (ref 135–145)
Sodium: 140 mmol/L (ref 135–145)
Sodium: 136 mmol/L (ref 135–145)
Sodium: 138 mmol/L (ref 135–145)
Sodium: 141 mmol/L (ref 135–145)
TCO2: 27 mmol/L (ref 22–32)
TCO2: 26 mmol/L (ref 22–32)
TCO2: 29 mmol/L (ref 22–32)
TCO2: 26 mmol/L (ref 22–32)
TCO2: 24 mmol/L (ref 22–32)
TCO2: 24 mmol/L (ref 22–32)

## 2018-08-26 LAB — CBC
HCT: 30.7 % — ABNORMAL LOW (ref 39.0–52.0)
HCT: 30.4 % — ABNORMAL LOW (ref 39.0–52.0)
Hemoglobin: 9.9 g/dL — ABNORMAL LOW (ref 13.0–17.0)
Hemoglobin: 9.9 g/dL — ABNORMAL LOW (ref 13.0–17.0)
MCH: 31.9 pg (ref 26.0–34.0)
MCH: 32.6 pg (ref 26.0–34.0)
MCHC: 32.2 g/dL (ref 30.0–36.0)
MCHC: 32.6 g/dL (ref 30.0–36.0)
MCV: 99 fL (ref 78.0–100.0)
MCV: 100 fL (ref 78.0–100.0)
Platelets: 123 10*3/uL — ABNORMAL LOW (ref 150–400)
Platelets: 122 10*3/uL — ABNORMAL LOW (ref 150–400)
RBC: 3.1 MIL/uL — ABNORMAL LOW (ref 4.22–5.81)
RBC: 3.04 MIL/uL — ABNORMAL LOW (ref 4.22–5.81)
RDW: 12.9 % (ref 11.5–15.5)
RDW: 13 % (ref 11.5–15.5)
WBC: 18.3 10*3/uL — ABNORMAL HIGH (ref 4.0–10.5)
WBC: 16.8 10*3/uL — ABNORMAL HIGH (ref 4.0–10.5)

## 2018-08-26 LAB — POCT I-STAT 3, ART BLOOD GAS (G3+)
Acid-Base Excess: 1 mmol/L (ref 0.0–2.0)
Acid-Base Excess: 1 mmol/L (ref 0.0–2.0)
Acid-base deficit: 2 mmol/L (ref 0.0–2.0)
Acid-base deficit: 3 mmol/L — ABNORMAL HIGH (ref 0.0–2.0)
Acid-base deficit: 3 mmol/L — ABNORMAL HIGH (ref 0.0–2.0)
Bicarbonate: 26.4 mmol/L (ref 20.0–28.0)
Bicarbonate: 26.1 mmol/L (ref 20.0–28.0)
Bicarbonate: 24.9 mmol/L (ref 20.0–28.0)
Bicarbonate: 23.1 mmol/L (ref 20.0–28.0)
Bicarbonate: 23.3 mmol/L (ref 20.0–28.0)
O2 Saturation: 100 %
O2 Saturation: 100 %
O2 Saturation: 98 %
O2 Saturation: 97 %
O2 Saturation: 96 %
Patient temperature: 35.5
Patient temperature: 36.1
Patient temperature: 36
TCO2: 28 mmol/L (ref 22–32)
TCO2: 27 mmol/L (ref 22–32)
TCO2: 26 mmol/L (ref 22–32)
TCO2: 25 mmol/L (ref 22–32)
TCO2: 25 mmol/L (ref 22–32)
pCO2 arterial: 47 mmHg (ref 32.0–48.0)
pCO2 arterial: 44.6 mmHg (ref 32.0–48.0)
pCO2 arterial: 46.1 mmHg (ref 32.0–48.0)
pCO2 arterial: 43.9 mmHg (ref 32.0–48.0)
pCO2 arterial: 42.5 mmHg (ref 32.0–48.0)
pH, Arterial: 7.358 (ref 7.350–7.450)
pH, Arterial: 7.375 (ref 7.350–7.450)
pH, Arterial: 7.333 — ABNORMAL LOW (ref 7.350–7.450)
pH, Arterial: 7.325 — ABNORMAL LOW (ref 7.350–7.450)
pH, Arterial: 7.341 — ABNORMAL LOW (ref 7.350–7.450)
pO2, Arterial: 424 mmHg — ABNORMAL HIGH (ref 83.0–108.0)
pO2, Arterial: 218 mmHg — ABNORMAL HIGH (ref 83.0–108.0)
pO2, Arterial: 114 mmHg — ABNORMAL HIGH (ref 83.0–108.0)
pO2, Arterial: 92 mmHg (ref 83.0–108.0)
pO2, Arterial: 84 mmHg (ref 83.0–108.0)

## 2018-08-26 LAB — GLUCOSE, CAPILLARY
Glucose-Capillary: 175 mg/dL — ABNORMAL HIGH (ref 70–99)
Glucose-Capillary: 155 mg/dL — ABNORMAL HIGH (ref 70–99)
Glucose-Capillary: 138 mg/dL — ABNORMAL HIGH (ref 70–99)
Glucose-Capillary: 149 mg/dL — ABNORMAL HIGH (ref 70–99)
Glucose-Capillary: 144 mg/dL — ABNORMAL HIGH (ref 70–99)
Glucose-Capillary: 156 mg/dL — ABNORMAL HIGH (ref 70–99)
Glucose-Capillary: 143 mg/dL — ABNORMAL HIGH (ref 70–99)
Glucose-Capillary: 138 mg/dL — ABNORMAL HIGH (ref 70–99)

## 2018-08-26 LAB — MAGNESIUM: Magnesium: 2.9 mg/dL — ABNORMAL HIGH (ref 1.7–2.4)

## 2018-08-26 LAB — POCT I-STAT 4, (NA,K, GLUC, HGB,HCT)
Glucose, Bld: 179 mg/dL — ABNORMAL HIGH (ref 70–99)
HCT: 30 % — ABNORMAL LOW (ref 39.0–52.0)
Hemoglobin: 10.2 g/dL — ABNORMAL LOW (ref 13.0–17.0)
Potassium: 4.2 mmol/L (ref 3.5–5.1)
Sodium: 143 mmol/L (ref 135–145)

## 2018-08-26 LAB — PLATELET COUNT: Platelets: 89 10*3/uL — ABNORMAL LOW (ref 150–400)

## 2018-08-26 LAB — CREATININE, SERUM
Creatinine, Ser: 1.47 mg/dL — ABNORMAL HIGH (ref 0.61–1.24)
GFR calc Af Amer: 52 mL/min — ABNORMAL LOW (ref >60)
GFR calc non Af Amer: 45 mL/min — ABNORMAL LOW (ref >60)

## 2018-08-26 LAB — HEMOGLOBIN AND HEMATOCRIT, BLOOD
HCT: 27.8 % — ABNORMAL LOW (ref 39.0–52.0)
Hemoglobin: 9.1 g/dL — ABNORMAL LOW (ref 13.0–17.0)

## 2018-08-26 LAB — PROTIME-INR
INR: 1.61
Prothrombin Time: 19.1 seconds — ABNORMAL HIGH (ref 11.4–15.2)

## 2018-08-26 LAB — APTT: aPTT: 28 seconds (ref 24–36)

## 2018-08-26 SURGERY — CORONARY ARTERY BYPASS GRAFTING (CABG)
Anesthesia: General | Site: Chest

## 2018-08-26 MED ORDER — CHLORHEXIDINE GLUCONATE 0.12 % MT SOLN
15.0000 mL | Freq: Once | OROMUCOSAL | Status: AC
  Administered 2018-08-26: 15 mL via OROMUCOSAL

## 2018-08-26 MED ORDER — SODIUM CHLORIDE 0.9 % IV SOLN
20.0000 ug | INTRAVENOUS | Status: AC
  Administered 2018-08-26: 20 ug via INTRAVENOUS
  Filled 2018-08-26: qty 5

## 2018-08-26 MED ORDER — MORPHINE SULFATE (PF) 2 MG/ML IV SOLN
2.0000 mg | INTRAVENOUS | Status: DC | PRN
  Administered 2018-08-26 – 2018-08-27 (×2): 2 mg via INTRAVENOUS
  Filled 2018-08-26 (×2): qty 1

## 2018-08-26 MED ORDER — DOCUSATE SODIUM 100 MG PO CAPS
200.0000 mg | ORAL_CAPSULE | Freq: Every day | ORAL | Status: DC
  Administered 2018-08-27 – 2018-09-01 (×5): 200 mg via ORAL
  Filled 2018-08-26 (×6): qty 2

## 2018-08-26 MED ORDER — SODIUM CHLORIDE 0.9 % IJ SOLN
OROMUCOSAL | Status: DC | PRN
  Administered 2018-08-26 (×3): 4 mL via TOPICAL

## 2018-08-26 MED ORDER — AMIODARONE HCL IN DEXTROSE 360-4.14 MG/200ML-% IV SOLN
60.0000 mg/h | INTRAVENOUS | Status: AC
  Administered 2018-08-26: 60 mg/h via INTRAVENOUS
  Filled 2018-08-26: qty 200

## 2018-08-26 MED ORDER — ARTIFICIAL TEARS OPHTHALMIC OINT
TOPICAL_OINTMENT | OPHTHALMIC | Status: AC
  Filled 2018-08-26: qty 3.5

## 2018-08-26 MED ORDER — SODIUM CHLORIDE 0.9% FLUSH
10.0000 mL | Freq: Two times a day (BID) | INTRAVENOUS | Status: DC
  Administered 2018-08-26 – 2018-08-28 (×5): 10 mL

## 2018-08-26 MED ORDER — SODIUM CHLORIDE 0.9% FLUSH: 10.0000 mL | INTRAVENOUS | Status: DC | PRN

## 2018-08-26 MED ORDER — MIDAZOLAM HCL 5 MG/5ML IJ SOLN
INTRAMUSCULAR | Status: DC | PRN
  Administered 2018-08-26: 3 mg via INTRAVENOUS
  Administered 2018-08-26: 2 mg via INTRAVENOUS
  Administered 2018-08-26: 3 mg via INTRAVENOUS
  Administered 2018-08-26: 2 mg via INTRAVENOUS

## 2018-08-26 MED ORDER — HEPARIN SODIUM (PORCINE) 1000 UNIT/ML IJ SOLN
INTRAMUSCULAR | Status: DC | PRN
  Administered 2018-08-26 (×2): 2000 [IU] via INTRAVENOUS
  Administered 2018-08-26: 26000 [IU] via INTRAVENOUS

## 2018-08-26 MED ORDER — AMIODARONE HCL IN DEXTROSE 360-4.14 MG/200ML-% IV SOLN: 60.0000 mg/h | INTRAVENOUS | Status: DC

## 2018-08-26 MED ORDER — PROPOFOL 10 MG/ML IV BOLUS
INTRAVENOUS | Status: DC | PRN
  Administered 2018-08-26: 40 mg via INTRAVENOUS

## 2018-08-26 MED ORDER — HEMOSTATIC AGENTS (NO CHARGE) OPTIME
TOPICAL | Status: DC | PRN
  Administered 2018-08-26 (×2): 1 via TOPICAL

## 2018-08-26 MED ORDER — PROTAMINE SULFATE 10 MG/ML IV SOLN
INTRAVENOUS | Status: AC
  Filled 2018-08-26: qty 25

## 2018-08-26 MED ORDER — SODIUM CHLORIDE 0.9 % IV SOLN: 250.0000 mL | INTRAVENOUS | Status: DC

## 2018-08-26 MED ORDER — ASPIRIN EC 325 MG PO TBEC
325.0000 mg | DELAYED_RELEASE_TABLET | Freq: Every day | ORAL | Status: DC
  Administered 2018-08-27 – 2018-08-30 (×4): 325 mg via ORAL
  Filled 2018-08-26 (×5): qty 1

## 2018-08-26 MED ORDER — CHLORHEXIDINE GLUCONATE CLOTH 2 % EX PADS
6.0000 | MEDICATED_PAD | Freq: Every day | CUTANEOUS | Status: DC
  Administered 2018-08-26 – 2018-08-28 (×3): 6 via TOPICAL

## 2018-08-26 MED ORDER — DOPAMINE-DEXTROSE 3.2-5 MG/ML-% IV SOLN
2.0000 ug/kg/min | INTRAVENOUS | Status: DC
  Administered 2018-08-26: 2 ug/kg/min via INTRAVENOUS

## 2018-08-26 MED ORDER — DEXAMETHASONE SODIUM PHOSPHATE 10 MG/ML IJ SOLN
INTRAMUSCULAR | Status: AC
  Filled 2018-08-26: qty 1

## 2018-08-26 MED ORDER — DIPHENHYDRAMINE HCL 50 MG/ML IJ SOLN
INTRAMUSCULAR | Status: AC
  Filled 2018-08-26: qty 1

## 2018-08-26 MED ORDER — CHLORHEXIDINE GLUCONATE 4 % EX LIQD: 30.0000 mL | CUTANEOUS | Status: DC

## 2018-08-26 MED ORDER — ALBUMIN HUMAN 5 % IV SOLN
INTRAVENOUS | Status: DC | PRN
  Administered 2018-08-26: 12:00:00 via INTRAVENOUS

## 2018-08-26 MED ORDER — DEXMEDETOMIDINE HCL IN NACL 200 MCG/50ML IV SOLN
0.0000 ug/kg/h | INTRAVENOUS | Status: DC
  Filled 2018-08-26: qty 50

## 2018-08-26 MED ORDER — MIDAZOLAM HCL 10 MG/2ML IJ SOLN
INTRAMUSCULAR | Status: AC
  Filled 2018-08-26: qty 2

## 2018-08-26 MED ORDER — ONDANSETRON HCL 4 MG/2ML IJ SOLN
INTRAMUSCULAR | Status: AC
  Filled 2018-08-26: qty 2

## 2018-08-26 MED ORDER — LACTATED RINGERS IV SOLN
INTRAVENOUS | Status: DC | PRN
  Administered 2018-08-26 (×2): via INTRAVENOUS

## 2018-08-26 MED ORDER — SODIUM CHLORIDE 0.9 % IV SOLN
1.5000 g | Freq: Two times a day (BID) | INTRAVENOUS | Status: AC
  Administered 2018-08-26 – 2018-08-28 (×4): 1.5 g via INTRAVENOUS
  Filled 2018-08-26 (×4): qty 1.5

## 2018-08-26 MED ORDER — ASPIRIN 81 MG PO CHEW
324.0000 mg | CHEWABLE_TABLET | Freq: Every day | ORAL | Status: DC
  Administered 2018-08-31: 324 mg
  Filled 2018-08-26: qty 4

## 2018-08-26 MED ORDER — AMIODARONE HCL IN DEXTROSE 360-4.14 MG/200ML-% IV SOLN
30.0000 mg/h | INTRAVENOUS | Status: DC
  Administered 2018-08-26 – 2018-08-27 (×2): 30 mg/h via INTRAVENOUS
  Filled 2018-08-26: qty 200

## 2018-08-26 MED ORDER — SODIUM CHLORIDE 0.9% IV SOLUTION: Freq: Once | INTRAVENOUS | Status: DC

## 2018-08-26 MED ORDER — DIPHENHYDRAMINE HCL 50 MG/ML IJ SOLN
INTRAMUSCULAR | Status: DC | PRN
  Administered 2018-08-26: 50 mg via INTRAVENOUS

## 2018-08-26 MED ORDER — SODIUM CHLORIDE 0.9% FLUSH: 3.0000 mL | INTRAVENOUS | Status: DC | PRN

## 2018-08-26 MED ORDER — POTASSIUM CHLORIDE 10 MEQ/50ML IV SOLN: 10.0000 meq | INTRAVENOUS | Status: AC

## 2018-08-26 MED ORDER — ONDANSETRON HCL 4 MG/2ML IJ SOLN
INTRAMUSCULAR | Status: DC | PRN
  Administered 2018-08-26: 4 mg via INTRAVENOUS

## 2018-08-26 MED ORDER — MICROFIBRILLAR COLL HEMOSTAT EX PADS: MEDICATED_PAD | CUTANEOUS | Status: DC | PRN

## 2018-08-26 MED ORDER — LACTATED RINGERS IV SOLN
INTRAVENOUS | Status: DC
  Administered 2018-08-27: 21:00:00 via INTRAVENOUS

## 2018-08-26 MED ORDER — SODIUM CHLORIDE 0.45 % IV SOLN: INTRAVENOUS | Status: DC | PRN

## 2018-08-26 MED ORDER — HEMOSTATIC AGENTS (NO CHARGE) OPTIME
TOPICAL | Status: DC | PRN
  Administered 2018-08-26: 1 via TOPICAL

## 2018-08-26 MED ORDER — ACETAMINOPHEN 160 MG/5ML PO SOLN: 650.0000 mg | Freq: Once | ORAL | Status: AC

## 2018-08-26 MED ORDER — ROCURONIUM BROMIDE 10 MG/ML (PF) SYRINGE
PREFILLED_SYRINGE | INTRAVENOUS | Status: DC | PRN
  Administered 2018-08-26: 50 mg via INTRAVENOUS
  Administered 2018-08-26: 20 mg via INTRAVENOUS
  Administered 2018-08-26: 50 mg via INTRAVENOUS
  Administered 2018-08-26: 30 mg via INTRAVENOUS
  Administered 2018-08-26: 50 mg via INTRAVENOUS

## 2018-08-26 MED ORDER — PROPOFOL 10 MG/ML IV BOLUS
INTRAVENOUS | Status: AC
  Filled 2018-08-26: qty 20

## 2018-08-26 MED ORDER — ACETAMINOPHEN 650 MG RE SUPP
650.0000 mg | Freq: Once | RECTAL | Status: AC
  Administered 2018-08-26: 650 mg via RECTAL

## 2018-08-26 MED ORDER — MORPHINE SULFATE (PF) 2 MG/ML IV SOLN
1.0000 mg | INTRAVENOUS | Status: DC | PRN
  Administered 2018-08-26: 2 mg via INTRAVENOUS
  Administered 2018-08-27: 4 mg via INTRAVENOUS
  Filled 2018-08-26: qty 1
  Filled 2018-08-26: qty 2

## 2018-08-26 MED ORDER — METOPROLOL TARTRATE 12.5 MG HALF TABLET
12.5000 mg | ORAL_TABLET | Freq: Two times a day (BID) | ORAL | Status: DC
  Administered 2018-08-27 – 2018-09-03 (×15): 12.5 mg via ORAL
  Filled 2018-08-26 (×15): qty 1

## 2018-08-26 MED ORDER — ACETAMINOPHEN 500 MG PO TABS
1000.0000 mg | ORAL_TABLET | Freq: Four times a day (QID) | ORAL | Status: AC
  Administered 2018-08-26 – 2018-08-31 (×19): 1000 mg via ORAL
  Filled 2018-08-26 (×19): qty 2

## 2018-08-26 MED ORDER — ATORVASTATIN CALCIUM 40 MG PO TABS
40.0000 mg | ORAL_TABLET | Freq: Every day | ORAL | Status: DC
  Administered 2018-08-27 – 2018-09-03 (×8): 40 mg via ORAL
  Filled 2018-08-26 (×8): qty 1

## 2018-08-26 MED ORDER — AMIODARONE HCL IN DEXTROSE 360-4.14 MG/200ML-% IV SOLN: 30.0000 mg/h | INTRAVENOUS | Status: DC

## 2018-08-26 MED ORDER — VANCOMYCIN HCL IN DEXTROSE 1-5 GM/200ML-% IV SOLN
1000.0000 mg | Freq: Once | INTRAVENOUS | Status: AC
  Administered 2018-08-26: 1000 mg via INTRAVENOUS
  Filled 2018-08-26: qty 200

## 2018-08-26 MED ORDER — PHENYLEPHRINE HCL-NACL 20-0.9 MG/250ML-% IV SOLN
0.0000 ug/min | INTRAVENOUS | Status: DC
  Filled 2018-08-26: qty 250

## 2018-08-26 MED ORDER — CHLORHEXIDINE GLUCONATE 0.12 % MT SOLN
OROMUCOSAL | Status: AC
  Administered 2018-08-26: 15 mL via OROMUCOSAL
  Filled 2018-08-26: qty 15

## 2018-08-26 MED ORDER — PLASMA-LYTE 148 IV SOLN
INTRAVENOUS | Status: DC | PRN
  Administered 2018-08-26: 500 mL via INTRAVASCULAR

## 2018-08-26 MED ORDER — PHENYLEPHRINE 40 MCG/ML (10ML) SYRINGE FOR IV PUSH (FOR BLOOD PRESSURE SUPPORT)
PREFILLED_SYRINGE | INTRAVENOUS | Status: DC | PRN
  Administered 2018-08-26: 80 ug via INTRAVENOUS

## 2018-08-26 MED ORDER — BISACODYL 10 MG RE SUPP: 10.0000 mg | Freq: Every day | RECTAL | Status: DC

## 2018-08-26 MED ORDER — NITROGLYCERIN IN D5W 200-5 MCG/ML-% IV SOLN: 0.0000 ug/min | INTRAVENOUS | Status: DC

## 2018-08-26 MED ORDER — METOPROLOL TARTRATE 12.5 MG HALF TABLET
ORAL_TABLET | ORAL | Status: AC
  Administered 2018-08-26: 12.5 mg via ORAL
  Filled 2018-08-26: qty 1

## 2018-08-26 MED ORDER — ONDANSETRON HCL 4 MG/2ML IJ SOLN
4.0000 mg | Freq: Four times a day (QID) | INTRAMUSCULAR | Status: DC | PRN
  Administered 2018-08-27 – 2018-08-28 (×2): 4 mg via INTRAVENOUS
  Filled 2018-08-26 (×2): qty 2

## 2018-08-26 MED ORDER — METOPROLOL TARTRATE 12.5 MG HALF TABLET
12.5000 mg | ORAL_TABLET | Freq: Once | ORAL | Status: AC
  Administered 2018-08-26: 12.5 mg via ORAL

## 2018-08-26 MED ORDER — MILRINONE LACTATE IN DEXTROSE 20-5 MG/100ML-% IV SOLN
0.1250 ug/kg/min | INTRAVENOUS | Status: DC
  Administered 2018-08-26 – 2018-08-27 (×3): 0.25 ug/kg/min via INTRAVENOUS
  Administered 2018-08-28: 0.125 ug/kg/min via INTRAVENOUS
  Filled 2018-08-26 (×4): qty 100

## 2018-08-26 MED ORDER — MAGNESIUM SULFATE 4 GM/100ML IV SOLN
4.0000 g | Freq: Once | INTRAVENOUS | Status: AC
  Administered 2018-08-26: 4 g via INTRAVENOUS

## 2018-08-26 MED ORDER — BISACODYL 5 MG PO TBEC
10.0000 mg | DELAYED_RELEASE_TABLET | Freq: Every day | ORAL | Status: DC
  Administered 2018-08-27 – 2018-09-01 (×5): 10 mg via ORAL
  Filled 2018-08-26 (×6): qty 2

## 2018-08-26 MED ORDER — FENTANYL CITRATE (PF) 250 MCG/5ML IJ SOLN
INTRAMUSCULAR | Status: AC
  Filled 2018-08-26: qty 30

## 2018-08-26 MED ORDER — ARTIFICIAL TEARS OPHTHALMIC OINT
TOPICAL_OINTMENT | OPHTHALMIC | Status: DC | PRN
  Administered 2018-08-26: 1 via OPHTHALMIC

## 2018-08-26 MED ORDER — CHLORHEXIDINE GLUCONATE 0.12 % MT SOLN
15.0000 mL | OROMUCOSAL | Status: AC
  Administered 2018-08-26: 15 mL via OROMUCOSAL

## 2018-08-26 MED ORDER — OXYCODONE HCL 5 MG PO TABS
5.0000 mg | ORAL_TABLET | ORAL | Status: DC | PRN
  Administered 2018-08-26 – 2018-08-29 (×7): 10 mg via ORAL
  Administered 2018-08-30: 5 mg via ORAL
  Filled 2018-08-26 (×4): qty 2
  Filled 2018-08-26: qty 1
  Filled 2018-08-26 (×4): qty 2

## 2018-08-26 MED ORDER — LACTATED RINGERS IV SOLN: INTRAVENOUS | Status: DC

## 2018-08-26 MED ORDER — ROCURONIUM BROMIDE 50 MG/5ML IV SOSY
PREFILLED_SYRINGE | INTRAVENOUS | Status: AC
  Filled 2018-08-26: qty 5

## 2018-08-26 MED ORDER — ALBUMIN HUMAN 5 % IV SOLN
250.0000 mL | INTRAVENOUS | Status: AC | PRN
  Administered 2018-08-26 (×3): 12.5 g via INTRAVENOUS
  Filled 2018-08-26: qty 250

## 2018-08-26 MED ORDER — FENTANYL CITRATE (PF) 250 MCG/5ML IJ SOLN
INTRAMUSCULAR | Status: DC | PRN
  Administered 2018-08-26: 100 ug via INTRAVENOUS
  Administered 2018-08-26 (×2): 250 ug via INTRAVENOUS
  Administered 2018-08-26: 100 ug via INTRAVENOUS
  Administered 2018-08-26: 50 ug via INTRAVENOUS
  Administered 2018-08-26: 100 ug via INTRAVENOUS
  Administered 2018-08-26: 50 ug via INTRAVENOUS
  Administered 2018-08-26: 100 ug via INTRAVENOUS
  Administered 2018-08-26: 50 ug via INTRAVENOUS
  Administered 2018-08-26: 250 ug via INTRAVENOUS
  Administered 2018-08-26: 50 ug via INTRAVENOUS
  Administered 2018-08-26: 250 ug via INTRAVENOUS

## 2018-08-26 MED ORDER — LIDOCAINE 2% (20 MG/ML) 5 ML SYRINGE
INTRAMUSCULAR | Status: DC | PRN
  Administered 2018-08-26: 80 mg via INTRAVENOUS

## 2018-08-26 MED ORDER — DEXAMETHASONE SODIUM PHOSPHATE 10 MG/ML IJ SOLN
INTRAMUSCULAR | Status: DC | PRN
  Administered 2018-08-26: 10 mg via INTRAVENOUS

## 2018-08-26 MED ORDER — SODIUM CHLORIDE 0.9 % IV SOLN
INTRAVENOUS | Status: DC
  Administered 2018-08-26: 13:00:00 via INTRAVENOUS

## 2018-08-26 MED ORDER — INSULIN REGULAR BOLUS VIA INFUSION
0.0000 [IU] | Freq: Three times a day (TID) | INTRAVENOUS | Status: DC
  Filled 2018-08-26: qty 10

## 2018-08-26 MED ORDER — TRAMADOL HCL 50 MG PO TABS
50.0000 mg | ORAL_TABLET | ORAL | Status: DC | PRN
  Administered 2018-08-28: 100 mg via ORAL
  Administered 2018-08-29 – 2018-08-30 (×2): 50 mg via ORAL
  Administered 2018-09-01 – 2018-09-02 (×2): 100 mg via ORAL
  Filled 2018-08-26: qty 1
  Filled 2018-08-26 (×2): qty 2
  Filled 2018-08-26: qty 1
  Filled 2018-08-26 (×2): qty 2

## 2018-08-26 MED ORDER — LACTATED RINGERS IV SOLN: 500.0000 mL | Freq: Once | INTRAVENOUS | Status: DC | PRN

## 2018-08-26 MED ORDER — SODIUM CHLORIDE 0.9% FLUSH
3.0000 mL | Freq: Two times a day (BID) | INTRAVENOUS | Status: DC
  Administered 2018-08-27 – 2018-08-28 (×3): 3 mL via INTRAVENOUS

## 2018-08-26 MED ORDER — ACETAMINOPHEN 160 MG/5ML PO SOLN: 1000.0000 mg | Freq: Four times a day (QID) | ORAL | Status: AC

## 2018-08-26 MED ORDER — PANTOPRAZOLE SODIUM 40 MG PO TBEC
40.0000 mg | DELAYED_RELEASE_TABLET | Freq: Every day | ORAL | Status: DC
  Administered 2018-08-28 – 2018-09-03 (×7): 40 mg via ORAL
  Filled 2018-08-26 (×7): qty 1

## 2018-08-26 MED ORDER — SODIUM CHLORIDE 0.9 % IV SOLN
INTRAVENOUS | Status: DC
  Administered 2018-08-27: 4.2 [IU]/h via INTRAVENOUS
  Filled 2018-08-26 (×3): qty 1

## 2018-08-26 MED ORDER — FENTANYL CITRATE (PF) 250 MCG/5ML IJ SOLN
INTRAMUSCULAR | Status: AC
  Filled 2018-08-26: qty 10

## 2018-08-26 MED ORDER — MAGNESIUM SULFATE 4 GM/100ML IV SOLN
INTRAVENOUS | Status: AC
  Administered 2018-08-26: 4 g via INTRAVENOUS
  Filled 2018-08-26: qty 100

## 2018-08-26 MED ORDER — METOPROLOL TARTRATE 25 MG/10 ML ORAL SUSPENSION: 12.5000 mg | Freq: Two times a day (BID) | ORAL | Status: DC

## 2018-08-26 MED ORDER — FAMOTIDINE IN NACL 20-0.9 MG/50ML-% IV SOLN
20.0000 mg | Freq: Two times a day (BID) | INTRAVENOUS | Status: AC
  Administered 2018-08-26 (×2): 20 mg via INTRAVENOUS
  Filled 2018-08-26: qty 50

## 2018-08-26 MED ORDER — METOPROLOL TARTRATE 5 MG/5ML IV SOLN: 2.5000 mg | INTRAVENOUS | Status: DC | PRN

## 2018-08-26 MED ORDER — PROTAMINE SULFATE 10 MG/ML IV SOLN
INTRAVENOUS | Status: DC | PRN
  Administered 2018-08-26: 300 mg via INTRAVENOUS

## 2018-08-26 MED ORDER — 0.9 % SODIUM CHLORIDE (POUR BTL) OPTIME
TOPICAL | Status: DC | PRN
  Administered 2018-08-26: 1000 mL

## 2018-08-26 MED ORDER — MIDAZOLAM HCL 2 MG/2ML IJ SOLN: 2.0000 mg | INTRAMUSCULAR | Status: DC | PRN

## 2018-08-26 SURGICAL SUPPLY — 107 items
ADAPTER CARDIO PERF ANTE/RETRO (ADAPTER) ×4 IMPLANT
ADH SKN CLS APL DERMABOND .7 (GAUZE/BANDAGES/DRESSINGS) ×2
ADPR PRFSN 84XANTGRD RTRGD (ADAPTER) ×2
BAG DECANTER FOR FLEXI CONT (MISCELLANEOUS) ×4 IMPLANT
BANDAGE ACE 4X5 VEL STRL LF (GAUZE/BANDAGES/DRESSINGS) ×4 IMPLANT
BANDAGE ACE 6X5 VEL STRL LF (GAUZE/BANDAGES/DRESSINGS) ×4 IMPLANT
BASKET HEART  (ORDER IN 25'S) (MISCELLANEOUS) ×1
BASKET HEART (ORDER IN 25'S) (MISCELLANEOUS) ×1
BASKET HEART (ORDER IN 25S) (MISCELLANEOUS) ×2 IMPLANT
BLADE CLIPPER SURG (BLADE) IMPLANT
BLADE MINI RND TIP GREEN BEAV (BLADE) ×2 IMPLANT
BLADE STERNUM SYSTEM 6 (BLADE) ×4 IMPLANT
BLADE SURG 12 STRL SS (BLADE) ×4 IMPLANT
BNDG GAUZE ELAST 4 BULKY (GAUZE/BANDAGES/DRESSINGS) ×4 IMPLANT
CANISTER SUCT 3000ML PPV (MISCELLANEOUS) ×4 IMPLANT
CANNULA GUNDRY RCSP 15FR (MISCELLANEOUS) ×4 IMPLANT
CATH CPB KIT VANTRIGT (MISCELLANEOUS) ×4 IMPLANT
CATH ROBINSON RED A/P 18FR (CATHETERS) ×12 IMPLANT
CATH THORACIC 36FR RT ANG (CATHETERS) ×4 IMPLANT
CLIP VESOCCLUDE SM WIDE 24/CT (CLIP) ×2 IMPLANT
COVER WAND RF STERILE (DRAPES) ×4 IMPLANT
CRADLE DONUT ADULT HEAD (MISCELLANEOUS) ×4 IMPLANT
DERMABOND ADVANCED (GAUZE/BANDAGES/DRESSINGS) ×2
DERMABOND ADVANCED .7 DNX12 (GAUZE/BANDAGES/DRESSINGS) IMPLANT
DRAIN CHANNEL 32F RND 10.7 FF (WOUND CARE) ×4 IMPLANT
DRAPE CARDIOVASCULAR INCISE (DRAPES) ×4
DRAPE SLUSH/WARMER DISC (DRAPES) ×4 IMPLANT
DRAPE SRG 135X102X78XABS (DRAPES) ×2 IMPLANT
DRSG AQUACEL AG ADV 3.5X 4 (GAUZE/BANDAGES/DRESSINGS) ×2 IMPLANT
DRSG AQUACEL AG ADV 3.5X14 (GAUZE/BANDAGES/DRESSINGS) ×4 IMPLANT
ELECT BLADE 4.0 EZ CLEAN MEGAD (MISCELLANEOUS) ×4
ELECT BLADE 6.5 EXT (BLADE) ×4 IMPLANT
ELECT CAUTERY BLADE 6.4 (BLADE) ×4 IMPLANT
ELECT REM PT RETURN 9FT ADLT (ELECTROSURGICAL) ×8
ELECTRODE BLDE 4.0 EZ CLN MEGD (MISCELLANEOUS) ×2 IMPLANT
ELECTRODE REM PT RTRN 9FT ADLT (ELECTROSURGICAL) ×4 IMPLANT
FELT TEFLON 1X6 (MISCELLANEOUS) ×8 IMPLANT
GAUZE SPONGE 4X4 12PLY STRL (GAUZE/BANDAGES/DRESSINGS) ×8 IMPLANT
GAUZE SPONGE 4X4 12PLY STRL LF (GAUZE/BANDAGES/DRESSINGS) ×4 IMPLANT
GLOVE BIO SURGEON STRL SZ 6.5 (GLOVE) ×6 IMPLANT
GLOVE BIO SURGEON STRL SZ7.5 (GLOVE) ×14 IMPLANT
GLOVE BIO SURGEONS STRL SZ 6.5 (GLOVE) ×6
GLOVE BIOGEL PI IND STRL 6.5 (GLOVE) IMPLANT
GLOVE BIOGEL PI IND STRL 7.0 (GLOVE) IMPLANT
GLOVE BIOGEL PI INDICATOR 6.5 (GLOVE) ×6
GLOVE BIOGEL PI INDICATOR 7.0 (GLOVE) ×4
GOWN STRL REUS W/ TWL LRG LVL3 (GOWN DISPOSABLE) ×8 IMPLANT
GOWN STRL REUS W/TWL LRG LVL3 (GOWN DISPOSABLE) ×32
HEMOSTAT POWDER SURGIFOAM 1G (HEMOSTASIS) ×12 IMPLANT
HEMOSTAT SURGICEL 2X14 (HEMOSTASIS) ×4 IMPLANT
INSERT FOGARTY XLG (MISCELLANEOUS) IMPLANT
KIT BASIN OR (CUSTOM PROCEDURE TRAY) ×4 IMPLANT
KIT SUCTION CATH 14FR (SUCTIONS) ×4 IMPLANT
KIT TURNOVER KIT B (KITS) ×4 IMPLANT
KIT VASOVIEW HEMOPRO VH 3000 (KITS) ×4 IMPLANT
LEAD PACING EPI (Prosthesis & Implant Heart) ×2 IMPLANT
LEAD PACING MYOCARDI (MISCELLANEOUS) ×4 IMPLANT
MARKER GRAFT CORONARY BYPASS (MISCELLANEOUS) ×12 IMPLANT
NS IRRIG 1000ML POUR BTL (IV SOLUTION) ×22 IMPLANT
PACK E OPEN HEART (SUTURE) ×4 IMPLANT
PACK OPEN HEART (CUSTOM PROCEDURE TRAY) ×4 IMPLANT
PAD ARMBOARD 7.5X6 YLW CONV (MISCELLANEOUS) ×8 IMPLANT
PAD ELECT DEFIB RADIOL ZOLL (MISCELLANEOUS) ×4 IMPLANT
PENCIL BUTTON HOLSTER BLD 10FT (ELECTRODE) ×4 IMPLANT
PUNCH AORTIC ROTATE  4.5MM 8IN (MISCELLANEOUS) ×2 IMPLANT
PUNCH AORTIC ROTATE 4.0MM (MISCELLANEOUS) IMPLANT
PUNCH AORTIC ROTATE 4.5MM 8IN (MISCELLANEOUS) IMPLANT
PUNCH AORTIC ROTATE 5MM 8IN (MISCELLANEOUS) IMPLANT
SET CARDIOPLEGIA MPS 5001102 (MISCELLANEOUS) ×2 IMPLANT
SPONGE LAP 18X18 X RAY DECT (DISPOSABLE) ×4 IMPLANT
SPONGE LAP 4X18 RFD (DISPOSABLE) ×2 IMPLANT
SURGIFLO W/THROMBIN 8M KIT (HEMOSTASIS) ×4 IMPLANT
SUT BONE WAX W31G (SUTURE) ×4 IMPLANT
SUT MNCRL AB 4-0 PS2 18 (SUTURE) IMPLANT
SUT PROLENE 3 0 SH DA (SUTURE) IMPLANT
SUT PROLENE 3 0 SH1 36 (SUTURE) IMPLANT
SUT PROLENE 4 0 RB 1 (SUTURE) ×4
SUT PROLENE 4 0 SH DA (SUTURE) ×6 IMPLANT
SUT PROLENE 4-0 RB1 .5 CRCL 36 (SUTURE) ×2 IMPLANT
SUT PROLENE 5 0 C 1 36 (SUTURE) ×4 IMPLANT
SUT PROLENE 6 0 C 1 30 (SUTURE) ×4 IMPLANT
SUT PROLENE 6 0 CC (SUTURE) ×16 IMPLANT
SUT PROLENE 8 0 BV175 6 (SUTURE) ×2 IMPLANT
SUT PROLENE BLUE 7 0 (SUTURE) ×6 IMPLANT
SUT SILK  1 MH (SUTURE)
SUT SILK 1 MH (SUTURE) IMPLANT
SUT SILK 2 0 SH CR/8 (SUTURE) ×2 IMPLANT
SUT SILK 3 0 SH CR/8 (SUTURE) IMPLANT
SUT STEEL 6MS V (SUTURE) ×8 IMPLANT
SUT STEEL SZ 6 DBL 3X14 BALL (SUTURE) ×6 IMPLANT
SUT VIC AB 1 CTX 36 (SUTURE) ×12
SUT VIC AB 1 CTX36XBRD ANBCTR (SUTURE) ×4 IMPLANT
SUT VIC AB 2-0 CT1 27 (SUTURE) ×4
SUT VIC AB 2-0 CT1 TAPERPNT 27 (SUTURE) IMPLANT
SUT VIC AB 2-0 CTX 27 (SUTURE) IMPLANT
SUT VIC AB 3-0 SH 8-18 (SUTURE) ×2 IMPLANT
SUT VIC AB 3-0 X1 27 (SUTURE) ×2 IMPLANT
SYR 5ML LUER SLIP (SYRINGE) ×2 IMPLANT
SYSTEM SAHARA CHEST DRAIN ATS (WOUND CARE) ×4 IMPLANT
TAPE CLOTH SURG 4X10 WHT LF (GAUZE/BANDAGES/DRESSINGS) ×2 IMPLANT
TAPE PAPER 2X10 WHT MICROPORE (GAUZE/BANDAGES/DRESSINGS) ×2 IMPLANT
TOWEL GREEN STERILE (TOWEL DISPOSABLE) ×4 IMPLANT
TOWEL GREEN STERILE FF (TOWEL DISPOSABLE) ×4 IMPLANT
TRAY FOLEY SLVR 16FR TEMP STAT (SET/KITS/TRAYS/PACK) ×4 IMPLANT
TUBING INSUFFLATION (TUBING) ×4 IMPLANT
UNDERPAD 30X30 (UNDERPADS AND DIAPERS) ×4 IMPLANT
WATER STERILE IRR 1000ML POUR (IV SOLUTION) ×8 IMPLANT

## 2018-08-26 NOTE — Anesthesia Procedure Notes (Signed)
Arterial Line Insertion Start/End10/05/2018 6:50 AM, 08/26/2018 7:00 AM Performed by: Lance Coon, CRNA, CRNA  Patient location: Pre-op. Preanesthetic checklist: patient identified, IV checked, site marked, risks and benefits discussed, surgical consent, monitors and equipment checked, pre-op evaluation, timeout performed and anesthesia consent Lidocaine 1% used for infiltration Left, radial was placed Catheter size: 20 G Hand hygiene performed , maximum sterile barriers used  and Seldinger technique used  Attempts: 1 Procedure performed without using ultrasound guided technique. Following insertion, dressing applied and Biopatch. Post procedure assessment: normal  Patient tolerated the procedure well with no immediate complications.

## 2018-08-26 NOTE — Plan of Care (Signed)
  Problem: Education: Goal: Knowledge of General Education information will improve Description: Including pain rating scale, medication(s)/side effects and non-pharmacologic comfort measures Outcome: Progressing   Problem: Health Behavior/Discharge Planning: Goal: Ability to manage health-related needs will improve Outcome: Progressing   Problem: Clinical Measurements: Goal: Ability to maintain clinical measurements within normal limits will improve Outcome: Progressing Goal: Will remain free from infection Outcome: Progressing Goal: Diagnostic test results will improve Outcome: Progressing Goal: Respiratory complications will improve Outcome: Progressing Goal: Cardiovascular complication will be avoided Outcome: Progressing   Problem: Coping: Goal: Level of anxiety will decrease Outcome: Progressing   Problem: Safety: Goal: Ability to remain free from injury will improve Outcome: Progressing   Problem: Skin Integrity: Goal: Risk for impaired skin integrity will decrease Outcome: Progressing   

## 2018-08-26 NOTE — H&P (Signed)
PCP is [name], MD Referring Provider is No ref. provider found  No chief complaint on file. INITIAL CONSULT Patient examined, images of coronary angiogram and 2D echo-cardiogram and cardiac CT scan personally reviewed and counseled with patient and wife. 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 one year ago the patient was diagnosed with complete heart block and underwent urgent transvenous pacemaker placement.  At that time his EF was 50-55%.  After follow-up echo showed reduction LV function he underwent a cardiac CT scan which showed evidence of coronary artery disease followed by cardiac catheterization.  The right coronary is small and diffusely diseased and probably not graftable.  The LAD diagonal and circumflex have high-grade stenoses 80 to 90% and appear to be adequate targets.  LVEDP is 20.  Patient is in sinus rhythm.  5 days ago just after cardiac catheterization via right radial artery the patient developed swelling ecchymoses and pain of his left hand and arm up to the axilla.  Ultrasound demonstrated thrombosis of the left subclavian vein and left axillary vein and left brachial vein.  He was started on Eliquis approximately 4 5 days ago.  The arm remains severely swollen and ecchymotic.  The patient has chronic lung disease from asthma and is very sensitive to flower scents  and most plants.  He uses inhalers as needed.  He has not had PFTs.  Past Medical History:  Diagnosis Date  . Arthritis   . Asthma   . CAD (coronary artery disease)    a. LHC 7/19: ostLM 30%, ostLAD 30%, p-mLAD 99% mod calcified, ostD1 90%, ost-pLCx 80% ulcerative & mod calcified, p-mLCx 50%, OM2 50%, mod dz LPDA, RCA small w/ diffuse dz throughout; b. recommendation of CABG  . Chronic systolic CHF (congestive heart failure) (HCC)    a. EF 35-40%, diffuse HK with HK  of the apical, anteroseptal, and anterior myocardium, Gr1DD, mild MR, mildly dilated LA, pacer wire noted in the RV with nl RVSF, PASP nl  . CKD (chronic kidney disease), stage III ([place])   . 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 DVT of left subclavian dx'd 06/17/2018; b. Eliquis  . GERD (gastroesophageal reflux disease)    takes otc occasionally  . History of complete heart block    a. s/p MDT PPM 05/2017  . Hypertension   . Obesity   . Presence of permanent cardiac pacemaker    Dr. Beckie Salts implanted 05/2017  . PVC's (premature ventricular contractions)    a. PPM-induced    Past Surgical History:  Procedure Laterality Date  . BACK SURGERY    . INSERT / REPLACE / REMOVE PACEMAKER     [employer]  . LEFT HEART CATH AND CORONARY ANGIOGRAPHY N/A 06/13/2018   Procedure: LEFT HEART CATH AND CORONARY ANGIOGRAPHY;  Surgeon: Wellington Hampshire, MD;  Location: [facility];  Service: Cardiovascular;  Laterality: N/A;  . LEG SURGERY    . PACEMAKER IMPLANT N/A 06/11/2017   Procedure: Pacemaker Implant;  Surgeon: Evans Lance, MD;  Location: [facility];  Service: Cardiovascular;  Laterality: N/A;    Family History  Problem Relation Age of Onset  . Stroke Mother   . Stroke Father   . Alcohol abuse Father   . Diabetes Brother   . Alcohol abuse Brother   . Stroke Maternal Grandfather  Social History Social History   Tobacco Use  . Smoking status: Never Smoker  . Smokeless tobacco: Never Used  Substance Use Topics  . Alcohol use: Yes    Comment: occ.  . Drug use: No    Current Facility-Administered Medications  Medication Dose Route Frequency Provider Last Rate Last Dose  . cefUROXime (ZINACEF) 1.5 g in sodium chloride 0.9 % 100 mL IVPB  1.5 g Intravenous To OR Prescott Gum, Collier Salina, MD      . cefUROXime (ZINACEF) 750 mg in sodium chloride 0.9 % 100 mL IVPB  750 mg Intravenous To OR Prescott Gum, Collier Salina, MD      . chlorhexidine (HIBICLENS) 4 % liquid 2  application  30 mL Topical UD Prescott Gum, Collier Salina, MD      . dexmedetomidine (PRECEDEX) 400 MCG/100ML (4 mcg/mL) infusion  0.1-0.7 mcg/kg/hr Intravenous To OR Prescott Gum, Collier Salina, MD      . DOPamine (INTROPIN) 800 mg in dextrose 5 % 250 mL (3.2 mg/mL) infusion  0-10 mcg/kg/min Intravenous To OR Prescott Gum, Collier Salina, MD      . EPINEPHrine (ADRENALIN) 4 mg in dextrose 5 % 250 mL (0.016 mg/mL) infusion  0-10 mcg/min Intravenous To OR Prescott Gum, Collier Salina, MD      . heparin 2,500 Units, papaverine 30 mg in electrolyte-148 (PLASMALYTE-148) 500 mL irrigation   Irrigation To OR Prescott Gum, Collier Salina, MD      . heparin 30,000 units/NS 1000 mL solution for CELLSAVER   Other To OR Prescott Gum, Collier Salina, MD      . insulin regular (NOVOLIN R,HUMULIN R) 100 Units in sodium chloride 0.9 % 100 mL (1 Units/mL) infusion   Intravenous To OR Prescott Gum, Collier Salina, MD      . magnesium sulfate (IV Push/IM) injection 40 mEq  40 mEq Other To OR Prescott Gum, Collier Salina, MD      . milrinone (PRIMACOR) 20 MG/100 ML (0.2 mg/mL) infusion  0.3 mcg/kg/min Intravenous To OR Prescott Gum, Collier Salina, MD      . nitroGLYCERIN 50 mg in dextrose 5 % 250 mL (0.2 mg/mL) infusion  2-200 mcg/min Intravenous To OR Prescott Gum, Collier Salina, MD      . phenylephrine (NEOSYNEPHRINE) 20-0.9 MG/250ML-% infusion  30-200 mcg/min Intravenous To OR Prescott Gum, Collier Salina, MD      . potassium chloride injection 80 mEq  80 mEq Other To OR Prescott Gum, Collier Salina, MD      . tranexamic acid (CYKLOKAPRON) 2,500 mg in sodium chloride 0.9 % 250 mL (10 mg/mL) infusion  1.5 mg/kg/hr Intravenous To OR Prescott Gum, Collier Salina, MD      . tranexamic acid (CYKLOKAPRON) bolus via infusion - over 30 minutes 1,488 mg  15 mg/kg Intravenous To OR Prescott Gum, Collier Salina, MD      . tranexamic acid (CYKLOKAPRON) pump prime solution 198 mg  2 mg/kg Intracatheter To OR Prescott Gum, Collier Salina, MD      . vancomycin (VANCOCIN) 1,250 mg in sodium chloride 0.9 % 250 mL IVPB  1,250 mg Intravenous To OR Ivin Poot, MD        Allergies  Allergen Reactions   . Azithromycin Hives  . Erythromycin Hives    Review of Systems                      Review of Systems :  [ y ] = yes, [  ] = no        General :  Weight gain [   ]  Weight loss  [   ]  Fatigue [  ]  Fever [  ]  Chills  [  ]                                          HEENT    Headache [  ]  Dizziness [  ]  Blurred vision [  ] Glaucoma  [  ]                          Nosebleeds [  ] Painful or loose teeth Blue.Reese  ]        Cardiac :  Chest pain/ pressure [  ]  Resting SOB [  ] exertional SOB [ y ]                        Orthopnea [  ]  Pedal edema  [  ]  Palpitations [  ] Syncope/presyncope [ ]                         Paroxysmal nocturnal dyspnea [  ]         Pulmonary : cough [ y ]  wheezing Blue.Reese  ]  Hemoptysis [  ] Sputum Blue.Reese  ] Snoring [  ]                              Pneumothorax [  ]  Sleep apnea [  ]        GI : Vomiting [  ]  Dysphagia [  ]  Melena  [  ]  Abdominal pain [  ] BRBPR [  ]              Heart burn [  ]  Constipation [  ] Diarrhea  [  ] Colonoscopy [   ]        GU : Hematuria [  ]  Dysuria [  ]  Nocturia [  ] UTI's [  ]        Vascular : Claudication [  ]  Rest pain [  ]  DVT [  ] Vein stripping [  ] leg ulcers [  ]                          TIA [  ] Stroke [  ]  Varicose veins [  ]        NEURO :  Headaches  [  ] Seizures [  ] Vision changes [  ] Paresthesias [  ]                                               Musculoskeletal :  Arthritis [  ] Gout  [  ]  Back pain [  ]  Joint pain [  ] left arm swelling and pain from left subclavian DVT        Skin :  Rash [  ]  Melanoma [  ] Sores [  ]        Heme : Bleeding  problems [  ]Clotting Disorders [  ] Anemia [  ]Blood Transfusion [ ]         Endocrine : Diabetes [  ] Heat or Cold intolerance [  ] Polyuria [  ]excessive thirst [ ]         Psych : Depression [  ]  Anxiety [  ]  Psych hospitalizations [  ] Memory change [  ]                                                                            BP (!) 167/94    Pulse 72   Temp 98.3 F (36.8 C) (Oral)   Resp 20   SpO2 98%  Physical Exam     Physical Exam  General: Overweight but very well-developed 77 year old male HEENT: Normocephalic pupils equal , dentition adequate but with missing teeth Neck: Supple without JVD, adenopathy, or bruit Chest: Clear to auscultation, symmetrical breath sounds, no rhonchi, no tenderness             or deformity Cardiovascular: Regular rate and rhythm, no murmur, no gallop, peripheral pulses             palpable in all extremities Abdomen:  Soft, nontender, no palpable mass or organomegaly Extremities: Warm, well-perfused, no clubbing cyanosis edema or tenderness, minimal venous stasis changes of the legs but significant swelling and ecchymoses of the left arm up to the axilla               Rectal/GU: Deferred Neuro: Grossly non--focal and symmetrical throughout Skin: Clean and dry without rash or ulceration   Office visit  September 25 Severe three-vessel coronary artery disease with moderate LV dysfunction DVT of left subclavian vein, left axillary vein Ultrasound of arm performed today shows some recanalization and flow through the subclavian and axillary veins.  Clinically the swelling is now improved.  I discussed the procedure of CABG in detail with the patient and his wife including the inspected benefits alternatives and risks. Dr. Adam Phenix, the patient's cardiologist wishes a left ventricular lead placed at the time of CABG in case his LV function does not improve after CABG  and resynchronization therapy would potentially help the patient.  Patient will stop his Coumadin 1 week before the surgery.  He will need a echocardiogram repeated prior to surgery during his preop visit since his last one was over 3 months ago.    Preop Update  No change in symptoms Patient off coumadin for a week with normal INR Echo repeated showing no change in EF .40 Preop O2 50 from chronic lung dz-  diffusion capacity ok Carotids w/o sig disease Plan CABG and LV wire as planned    Dahlia Byes MD [facility] [phone]

## 2018-08-26 NOTE — Procedures (Signed)
Extubation Procedure Note  Patient Details:   Name: Sean [name] DOB: 1941-10-26 MRN: [id number]   Airway Documentation:    Vent end date: 08/26/18 Vent end time: 1848   Evaluation  O2 sats: stable throughout Complications: No apparent complications Patient did tolerate procedure well. Bilateral Breath Sounds: Diminished   Yes   Patient had a VC of 1.04L and a NIF of -24. Extubation was performed. Cuff leak was heard. No stridor was noted. RN and family at bedside with RT during extubation. Patient tolerated well.  Tiburcio Bash 08/26/2018, 6:52 PM

## 2018-08-26 NOTE — Anesthesia Procedure Notes (Signed)
Procedure Name: Intubation Date/Time: 08/26/2018 7:52 AM Performed by: Lance Coon, CRNA Pre-anesthesia Checklist: Patient identified, Emergency Drugs available, Suction available, Patient being monitored and Timeout performed Patient Re-evaluated:Patient Re-evaluated prior to induction Oxygen Delivery Method: Circle system utilized Preoxygenation: Pre-oxygenation with 100% oxygen Induction Type: IV induction Ventilation: Mask ventilation without difficulty Laryngoscope Size: Miller and 3 Grade View: Grade I Tube type: Oral Tube size: 8.0 mm Number of attempts: 1 Airway Equipment and Method: Stylet Placement Confirmation: ETT inserted through vocal cords under direct vision,  positive ETCO2 and breath sounds checked- equal and bilateral Secured at: 21 cm Tube secured with: Tape Dental Injury: Teeth and Oropharynx as per pre-operative assessment

## 2018-08-26 NOTE — Progress Notes (Signed)
[facility] BRIEF SICU PROGRESS NOTE  Day of Surgery  S/P Procedure(s) (LRB): CORONARY ARTERY BYPASS GRAFTING (CABG) x3 , Using left internal mammory artery and right leg greater saphronious vein. Harvested endoscopically. (N/A) TRANSESOPHAGEAL ECHOCARDIOGRAM (TEE) (N/A) LV PACING LEAD PLACEMENT (N/A)   Just extubated a few minutes ago, breathing comfortably, O2 sats 98% AV paced w/ stable hemodynamics on milrinone, dopamine and Neo drips Chest tube output low UOP 35-75 mL/hr Labs okay  Plan: Continue routine early postop  Rexene Alberts, MD 08/26/2018 6:54 PM

## 2018-08-26 NOTE — Anesthesia Procedure Notes (Signed)
Central Venous Catheter Insertion Performed by: Oleta Mouse, MD, anesthesiologist Start/End10/05/2018 6:45 AM, 08/26/2018 6:55 AM Patient location: Pre-op. Preanesthetic checklist: patient identified, IV checked, site marked, risks and benefits discussed, surgical consent, monitors and equipment checked, pre-op evaluation, timeout performed and anesthesia consent Position: Trendelenburg Lidocaine 1% used for infiltration and patient sedated Hand hygiene performed  and maximum sterile barriers used  Catheter size: 8.5 Fr Total catheter length 10. Sheath introducer Procedure performed using ultrasound guided technique. Ultrasound Notes:anatomy identified, needle tip was noted to be adjacent to the nerve/plexus identified, no ultrasound evidence of intravascular and/or intraneural injection and image(s) printed for medical record Attempts: 1 Following insertion, line sutured, dressing applied and Biopatch. Post procedure assessment: blood return through all ports, free fluid flow and no air  Patient tolerated the procedure well with no immediate complications.

## 2018-08-26 NOTE — OR Nursing (Signed)
1203- 45 minute call made to SICU charge.

## 2018-08-26 NOTE — OR Nursing (Signed)
12:50 - 20 minute call to SICU charge nurse

## 2018-08-26 NOTE — Progress Notes (Deleted)
*  PRELIMINARY RESULTS*ardiogram Echocardiogram Transesophageal has been performed.  Sean [name] 08/26/2018, 8:23 AM

## 2018-08-26 NOTE — Anesthesia Procedure Notes (Signed)
Central Venous Catheter Insertion Performed by: Oleta Mouse, MD, anesthesiologist Start/End10/05/2018 6:45 AM, 08/26/2018 6:55 AM Patient location: Pre-op. Preanesthetic checklist: patient identified, IV checked, site marked, risks and benefits discussed, surgical consent, monitors and equipment checked, pre-op evaluation, timeout performed and anesthesia consent Position: supine Hand hygiene performed  and maximum sterile barriers used  PA cath was placed.Swan type:thermodilution Procedure performed without using ultrasound guided technique. Attempts: 1 Patient tolerated the procedure well with no immediate complications.

## 2018-08-26 NOTE — Transfer of Care (Signed)
Immediate Anesthesia Transfer of Care Note  Patient: Sean [name]  Procedure(s) Performed: CORONARY ARTERY BYPASS GRAFTING (CABG) x3 , Using left internal mammory artery and right leg greater saphronious vein. Harvested endoscopically. (N/A Chest) TRANSESOPHAGEAL ECHOCARDIOGRAM (TEE) (N/A ) LV PACING LEAD PLACEMENT (N/A )  Patient Location: PACU  Anesthesia Type:General  Level of Consciousness: sedated, unresponsive and Patient remains intubated per anesthesia plan  Airway & Oxygen Therapy: Patient remains intubated per anesthesia plan and Patient placed on Ventilator (see vital sign flow sheet for setting)  Post-op Assessment: Report given to RN and Post -op Vital signs reviewed and stable  Post vital signs: Reviewed and stable  Last Vitals:  Vitals Value Taken Time  BP    Temp    Pulse    Resp    SpO2      Last Pain:  Vitals:   08/26/18 0609  TempSrc:   PainSc: 0-No pain         Complications: No apparent anesthesia complications

## 2018-08-26 NOTE — Progress Notes (Signed)
  Echocardiogram Echocardiogram Transesophageal has been performed.  Bobbye Charleston 08/26/2018, 8:23 AM

## 2018-08-26 NOTE — Brief Op Note (Signed)
      [address]       [place] [address]             [phone]     08/26/2018  8:29 AM  PATIENT:  Sean [name]  77 y.o. male  PRE-OPERATIVE DIAGNOSIS:  CAD CHF  POST-OPERATIVE DIAGNOSIS:  CAD CHF  PROCEDURE:  Procedure(s): CORONARY ARTERY BYPASS GRAFTING (CABG) x3 , Using left internal mammory artery and right leg greater saphronious vein. Harvested endoscopically. (N/A) TRANSESOPHAGEAL ECHOCARDIOGRAM (TEE) (N/A) LV PACING LEAD PLACEMENT (N/A)  LIMA-LAD SVG-OM SVG-DIAG  SURGEON:  Surgeon(s) and Role: Panel 1:    * Ivin Poot, MD - Primary Panel 2:    * Prescott Gum, Collier Salina, MD - Primary  PHYSICIAN ASSISTANT: WAYNE GOLD PA-C  ANESTHESIA:   general  EBL:  1500 mL   BLOOD ADMINISTERED:2 UNITS FFP  DRAINS: ROUTINE PLEURAL AND PERICARDIAL CHEST TUBES   LOCAL MEDICATIONS USED:  NONE  SPECIMEN:  No Specimen  DISPOSITION OF SPECIMEN:  N/A  COUNTS:  YES  TOURNIQUET:  * No tourniquets in log *  DICTATION: .Other Dictation: Dictation Number [id number]  PLAN OF CARE: Admit to inpatient   PATIENT DISPOSITION:  ICU - intubated and hemodynamically stable.   Delay start of Pharmacological VTE agent (>24hrs) due to surgical blood loss or risk of bleeding: yes

## 2018-08-26 NOTE — Progress Notes (Signed)
Pre Procedure note for inpatients:   Sean [name] has been scheduled for Procedure(s): CORONARY ARTERY BYPASS GRAFTING (CABG) (N/A) TRANSESOPHAGEAL ECHOCARDIOGRAM (TEE) (N/A) LV PACING LEAD PLACEMENT (N/A) today. The various methods of treatment have been discussed with the patient. After consideration of the risks, benefits and treatment options the patient has consented to the planned procedure.   The patient has been seen and labs reviewed. There are no changes in the patient's condition to prevent proceeding with the planned procedure today.  Recent labs:  Lab Results  Component Value Date   WBC 8.0 08/22/2018   HGB 15.0 08/22/2018   HCT 47.3 08/22/2018   PLT 169 08/22/2018   GLUCOSE 101 (H) 08/22/2018   CHOL 116 06/26/2018   TRIG 64 06/26/2018   HDL 43 06/26/2018   LDLCALC 60 06/26/2018   ALT 23 08/22/2018   AST 27 08/22/2018   NA 139 08/22/2018   K 4.3 08/22/2018   CL 106 08/22/2018   CREATININE 1.39 (H) 08/22/2018   BUN 23 08/22/2018   CO2 25 08/22/2018   TSH 3.141 06/26/2018   INR 1.19 08/22/2018   HGBA1C 6.0 (H) 08/22/2018    Len Childs, MD 08/26/2018 7:13 AM

## 2018-08-27 ENCOUNTER — Encounter (HOSPITAL_COMMUNITY): Payer: Self-pay | Admitting: Cardiothoracic Surgery

## 2018-08-27 ENCOUNTER — Inpatient Hospital Stay (HOSPITAL_COMMUNITY): Payer: PPO

## 2018-08-27 LAB — PREPARE FRESH FROZEN PLASMA

## 2018-08-27 LAB — BPAM FFP
Blood Product Expiration Date: 201910072359
Blood Product Expiration Date: 201910072359
ISSUE DATE / TIME: 201910071100
ISSUE DATE / TIME: 201910071100
Unit Type and Rh: 6200
Unit Type and Rh: 6200

## 2018-08-27 LAB — BASIC METABOLIC PANEL
Anion gap: 7 (ref 5–15)
BUN: 29 mg/dL — ABNORMAL HIGH (ref 8–23)
CO2: 23 mmol/L (ref 22–32)
Calcium: 8.3 mg/dL — ABNORMAL LOW (ref 8.9–10.3)
Chloride: 108 mmol/L (ref 98–111)
Creatinine, Ser: 1.41 mg/dL — ABNORMAL HIGH (ref 0.61–1.24)
GFR calc Af Amer: 54 mL/min — ABNORMAL LOW (ref >60)
GFR calc non Af Amer: 47 mL/min — ABNORMAL LOW (ref >60)
Glucose, Bld: 120 mg/dL — ABNORMAL HIGH (ref 70–99)
Potassium: 4.1 mmol/L (ref 3.5–5.1)
Sodium: 138 mmol/L (ref 135–145)

## 2018-08-27 LAB — CBC
HCT: 31.3 % — ABNORMAL LOW (ref 39.0–52.0)
HCT: 31.5 % — ABNORMAL LOW (ref 39.0–52.0)
Hemoglobin: 10.1 g/dL — ABNORMAL LOW (ref 13.0–17.0)
Hemoglobin: 9.9 g/dL — ABNORMAL LOW (ref 13.0–17.0)
MCH: 32.5 pg (ref 26.0–34.0)
MCH: 31.6 pg (ref 26.0–34.0)
MCHC: 32.3 g/dL (ref 30.0–36.0)
MCHC: 31.4 g/dL (ref 30.0–36.0)
MCV: 100.6 fL — ABNORMAL HIGH (ref 80.0–100.0)
MCV: 100.6 fL — ABNORMAL HIGH (ref 80.0–100.0)
Platelets: 128 10*3/uL — ABNORMAL LOW (ref 150–400)
Platelets: 123 10*3/uL — ABNORMAL LOW (ref 150–400)
RBC: 3.11 MIL/uL — ABNORMAL LOW (ref 4.22–5.81)
RBC: 3.13 MIL/uL — ABNORMAL LOW (ref 4.22–5.81)
RDW: 13.2 % (ref 11.5–15.5)
RDW: 13.7 % (ref 11.5–15.5)
WBC: 18.5 10*3/uL — ABNORMAL HIGH (ref 4.0–10.5)
WBC: 20.7 10*3/uL — ABNORMAL HIGH (ref 4.0–10.5)

## 2018-08-27 LAB — GLUCOSE, CAPILLARY
Glucose-Capillary: 109 mg/dL — ABNORMAL HIGH (ref 70–99)
Glucose-Capillary: 111 mg/dL — ABNORMAL HIGH (ref 70–99)
Glucose-Capillary: 111 mg/dL — ABNORMAL HIGH (ref 70–99)
Glucose-Capillary: 112 mg/dL — ABNORMAL HIGH (ref 70–99)
Glucose-Capillary: 112 mg/dL — ABNORMAL HIGH (ref 70–99)
Glucose-Capillary: 114 mg/dL — ABNORMAL HIGH (ref 70–99)
Glucose-Capillary: 115 mg/dL — ABNORMAL HIGH (ref 70–99)
Glucose-Capillary: 117 mg/dL — ABNORMAL HIGH (ref 70–99)
Glucose-Capillary: 117 mg/dL — ABNORMAL HIGH (ref 70–99)
Glucose-Capillary: 119 mg/dL — ABNORMAL HIGH (ref 70–99)
Glucose-Capillary: 120 mg/dL — ABNORMAL HIGH (ref 70–99)
Glucose-Capillary: 121 mg/dL — ABNORMAL HIGH (ref 70–99)
Glucose-Capillary: 122 mg/dL — ABNORMAL HIGH (ref 70–99)
Glucose-Capillary: 127 mg/dL — ABNORMAL HIGH (ref 70–99)
Glucose-Capillary: 137 mg/dL — ABNORMAL HIGH (ref 70–99)
Glucose-Capillary: 154 mg/dL — ABNORMAL HIGH (ref 70–99)

## 2018-08-27 LAB — TYPE AND SCREEN
ABO/RH(D): A POS
Antibody Screen: NEGATIVE

## 2018-08-27 LAB — POCT I-STAT, CHEM 8
BUN: 35 mg/dL — ABNORMAL HIGH (ref 8–23)
Calcium, Ion: 1.17 mmol/L (ref 1.15–1.40)
Chloride: 101 mmol/L (ref 98–111)
Creatinine, Ser: 1.6 mg/dL — ABNORMAL HIGH (ref 0.61–1.24)
Glucose, Bld: 161 mg/dL — ABNORMAL HIGH (ref 70–99)
HCT: 30 % — ABNORMAL LOW (ref 39.0–52.0)
Hemoglobin: 10.2 g/dL — ABNORMAL LOW (ref 13.0–17.0)
Potassium: 4.5 mmol/L (ref 3.5–5.1)
Sodium: 137 mmol/L (ref 135–145)
TCO2: 26 mmol/L (ref 22–32)

## 2018-08-27 LAB — CREATININE, SERUM
Creatinine, Ser: 1.54 mg/dL — ABNORMAL HIGH (ref 0.61–1.24)
GFR calc Af Amer: 49 mL/min — ABNORMAL LOW (ref >60)
GFR calc non Af Amer: 42 mL/min — ABNORMAL LOW (ref >60)

## 2018-08-27 LAB — BPAM PLATELET PHERESIS
Blood Product Expiration Date: 201910072359
ISSUE DATE / TIME: 201910071131
Unit Type and Rh: 6200

## 2018-08-27 LAB — PREPARE PLATELET PHERESIS: Unit division: 0

## 2018-08-27 LAB — MAGNESIUM
Magnesium: 2.7 mg/dL — ABNORMAL HIGH (ref 1.7–2.4)
Magnesium: 2.6 mg/dL — ABNORMAL HIGH (ref 1.7–2.4)

## 2018-08-27 MED ORDER — FUROSEMIDE 10 MG/ML IJ SOLN
40.0000 mg | Freq: Two times a day (BID) | INTRAMUSCULAR | Status: DC
  Administered 2018-08-28 – 2018-08-30 (×5): 40 mg via INTRAVENOUS
  Filled 2018-08-27 (×5): qty 4

## 2018-08-27 MED ORDER — INSULIN ASPART 100 UNIT/ML ~~LOC~~ SOLN
4.0000 [IU] | Freq: Three times a day (TID) | SUBCUTANEOUS | Status: DC
  Administered 2018-08-27 – 2018-08-30 (×5): 4 [IU] via SUBCUTANEOUS

## 2018-08-27 MED ORDER — FUROSEMIDE 10 MG/ML IJ SOLN
20.0000 mg | Freq: Once | INTRAMUSCULAR | Status: AC
  Administered 2018-08-27: 20 mg via INTRAVENOUS
  Filled 2018-08-27: qty 2

## 2018-08-27 MED ORDER — METOCLOPRAMIDE HCL 5 MG/ML IJ SOLN
5.0000 mg | Freq: Three times a day (TID) | INTRAMUSCULAR | Status: AC
  Administered 2018-08-27 – 2018-08-28 (×3): 5 mg via INTRAVENOUS
  Filled 2018-08-27 (×3): qty 2

## 2018-08-27 MED ORDER — INSULIN DETEMIR 100 UNIT/ML ~~LOC~~ SOLN
12.0000 [IU] | Freq: Two times a day (BID) | SUBCUTANEOUS | Status: DC
  Administered 2018-08-27 – 2018-08-29 (×5): 12 [IU] via SUBCUTANEOUS
  Filled 2018-08-27 (×7): qty 0.12

## 2018-08-27 MED ORDER — INSULIN ASPART 100 UNIT/ML ~~LOC~~ SOLN: 0.0000 [IU] | SUBCUTANEOUS | Status: DC

## 2018-08-27 MED ORDER — FUROSEMIDE 10 MG/ML IJ SOLN
20.0000 mg | Freq: Two times a day (BID) | INTRAMUSCULAR | Status: DC
  Administered 2018-08-27 (×2): 20 mg via INTRAVENOUS
  Filled 2018-08-27 (×2): qty 2

## 2018-08-27 MED ORDER — INSULIN ASPART 100 UNIT/ML ~~LOC~~ SOLN
0.0000 [IU] | SUBCUTANEOUS | Status: DC
  Administered 2018-08-27: 4 [IU] via SUBCUTANEOUS
  Administered 2018-08-27 – 2018-08-28 (×3): 2 [IU] via SUBCUTANEOUS

## 2018-08-27 MED ORDER — SODIUM CHLORIDE 0.9% FLUSH
10.0000 mL | Freq: Two times a day (BID) | INTRAVENOUS | Status: DC
  Administered 2018-08-27 – 2018-08-29 (×5): 10 mL

## 2018-08-27 MED ORDER — SODIUM CHLORIDE 0.9% FLUSH: 10.0000 mL | INTRAVENOUS | Status: DC | PRN

## 2018-08-27 MED ORDER — CHLORHEXIDINE GLUCONATE CLOTH 2 % EX PADS
6.0000 | MEDICATED_PAD | Freq: Every day | CUTANEOUS | Status: DC
  Administered 2018-08-27: 6 via TOPICAL

## 2018-08-27 MED ORDER — VANCOMYCIN HCL IN DEXTROSE 1-5 GM/200ML-% IV SOLN
1000.0000 mg | Freq: Once | INTRAVENOUS | Status: AC
  Administered 2018-08-27: 1000 mg via INTRAVENOUS
  Filled 2018-08-27: qty 200

## 2018-08-27 NOTE — Progress Notes (Signed)
[address]       [place] [address]             [phone]      T[facility] DAILY ICU PROGRESS NOTE                   [address]            [place] [address]          [phone]   1 Day Post-Op Procedure(s) (LRB): CORONARY ARTERY BYPASS GRAFTING (CABG) x3 , Using left internal mammory artery and right leg greater saphronious vein. Harvested endoscopically. (N/A) TRANSESOPHAGEAL ECHOCARDIOGRAM (TEE) (N/A) LV PACING LEAD PLACEMENT (N/A)  Total Length of Stay:  LOS: 1 day   Subjective: Feels ok, some mild nausea earlier this am  Objective: Vital signs in last 24 hours: Temp:  [95.9 F (35.5 C)-97.7 F (36.5 C)] 97.2 F (36.2 C) (10/08 0800) Pulse Rate:  [80-98] 94 (10/08 0800) Cardiac Rhythm: Normal sinus rhythm (10/08 0400) Resp:  [12-25] 16 (10/08 0800) BP: (89-129)/(58-83) 129/68 (10/08 0800) SpO2:  [93 %-100 %] 93 % (10/08 0800) Arterial Line BP: (97-222)/(46-219) 152/62 (10/08 0800) FiO2 (%):  [40 %-50 %] 40 % (10/07 1810) Weight:  [103.9 kg] 103.9 kg (10/08 0512)  Filed Weights   08/27/18 0512  Weight: 103.9 kg    Weight change:    Hemodynamic parameters for last 24 hours: PAP: (20-45)/(12-27) 32/23 CO:  [3.6 L/min-7.1 L/min] 5.9 L/min CI:  [1.7 L/min/m2-3.4 L/min/m2] 2.8 L/min/m2  Intake/Output from previous day: 10/07 0701 - 10/08 0700 In: 5158.4 [I.V.:3023.3; Blood:1335; IV Piggyback:800.2] Out: 2956 [Urine:2485; Blood:1500; Chest Tube:370]  Intake/Output this shift: Total I/O In: 96.3 [I.V.:96.3] Out: 75 [Urine:65; Chest Tube:10]  Current Meds: Scheduled Meds: . acetaminophen  1,000 mg Oral Q6H   Or  . acetaminophen (TYLENOL) oral liquid 160 mg/5 mL  1,000 mg Per Tube Q6H  . aspirin EC  325 mg Oral Daily   Or  . aspirin  324 mg Per Tube Daily  . atorvastatin  40 mg Oral Daily  . bisacodyl  10 mg Oral Daily   Or  . bisacodyl  10 mg Rectal Daily  . Chlorhexidine Gluconate Cloth  6 each Topical  Daily  . Chlorhexidine Gluconate Cloth  6 each Topical Daily  . docusate sodium  200 mg Oral Daily  . furosemide  20 mg Intravenous BID  . insulin aspart  0-24 Units Subcutaneous Q4H  . insulin aspart  4 Units Subcutaneous TID WC  . insulin detemir  12 Units Subcutaneous BID  . metoprolol tartrate  12.5 mg Oral BID   Or  . metoprolol tartrate  12.5 mg Per Tube BID  . [START ON 08/28/2018] pantoprazole  40 mg Oral Daily  . sodium chloride flush  10-40 mL Intracatheter Q12H  . sodium chloride flush  10-40 mL Intracatheter Q12H  . sodium chloride flush  3 mL Intravenous Q12H   Continuous Infusions: . sodium chloride 10 mL/hr at 08/27/18 0800  . sodium chloride    . sodium chloride 20 mL/hr at 08/27/18 0800  . albumin human 12.5 g (08/26/18 1939)  . cefUROXime (ZINACEF)  IV Stopped (08/27/18 0334)  . lactated ringers    . lactated ringers 20 mL/hr at 08/27/18 0800  . milrinone 0.25 mcg/kg/min (08/27/18 0800)  . nitroGLYCERIN 50 mcg/min (08/27/18 0800)  . phenylephrine (NEO-SYNEPHRINE) Adult infusion Stopped (08/27/18 0410)  . vancomycin     PRN  Meds:.sodium chloride, albumin human, metoprolol tartrate, midazolam, morphine injection, ondansetron (ZOFRAN) IV, oxyCODONE, sodium chloride flush, sodium chloride flush, sodium chloride flush, traMADol  General appearance: alert, cooperative and no distress Heart: regular rate and rhythm Lungs: clear anteriorly Abdomen: soft, non-tender Extremities: + edema Wound: dressings CDI  Lab Results: CBC: Recent Labs    08/26/18 2100 08/27/18 0500  WBC 16.8* 18.5*  HGB 9.9* 10.1*  HCT 30.4* 31.3*  PLT 122* 128*   BMET:  Recent Labs    08/26/18 2006 08/27/18 0500  NA 141 138  K 3.8 4.1  CL 105 108  CO2  --  23  GLUCOSE 161* 120*  BUN 29* 29*  CREATININE 1.40* 1.41*  CALCIUM  --  8.3*    CMET: Lab Results  Component Value Date   WBC 18.5 (H) 08/27/2018   HGB 10.1 (L) 08/27/2018   HCT 31.3 (L) 08/27/2018   PLT 128 (L)  08/27/2018   GLUCOSE 120 (H) 08/27/2018   CHOL 116 06/26/2018   TRIG 64 06/26/2018   HDL 43 06/26/2018   LDLCALC 60 06/26/2018   ALT 23 08/22/2018   AST 27 08/22/2018   NA 138 08/27/2018   K 4.1 08/27/2018   CL 108 08/27/2018   CREATININE 1.41 (H) 08/27/2018   BUN 29 (H) 08/27/2018   CO2 23 08/27/2018   TSH 3.141 06/26/2018   INR 1.61 08/26/2018   HGBA1C 6.0 (H) 08/22/2018      PT/INR:  Recent Labs    08/26/18 1332  LABPROT 19.1*  INR 1.61   Radiology: Dg Chest Port 1 View  Result Date: 08/26/2018 CLINICAL DATA:  Status post CABG surgery. EXAM: PORTABLE CHEST 1 VIEW COMPARISON:  08/22/2018 FINDINGS: Since the prior exam, CABG surgery has been performed. The standard lines and tubes are in place, including: An endotracheal tube with its tip 3.4 cm above the Carina, a right internal jugular Swan-Ganz catheter with its tip directed into the right pulmonary artery, a nasal/orogastric tube passing below the diaphragm well into the stomach, a mediastinal tube and a left-sided chest tube. There is opacity at the left lung base obscuring the hemidiaphragm consistent with a small effusion and atelectasis. Minor atelectasis is noted in the left apex adjacent to the tip of the chest tube. There is no evidence of pulmonary edema. No pneumothorax. No mediastinal widening. Left anterior chest wall pacemaker appears stable from the preoperative exam. IMPRESSION: 1. Status post CABG surgery. No evidence of an operative complication. 2. Support apparatus is well positioned. 3. Small left pleural effusion with left lung base atelectasis. No pulmonary edema, pneumothorax or mediastinal widening. Electronically Signed   By: Lajean Manes M.D.   On: 08/26/2018 13:57     Assessment/Plan: S/P Procedure(s) (LRB): CORONARY ARTERY BYPASS GRAFTING (CABG) x3 , Using left internal mammory artery and right leg greater saphronious vein. Harvested endoscopically. (N/A) TRANSESOPHAGEAL ECHOCARDIOGRAM (TEE)  (N/A) LV PACING LEAD PLACEMENT (N/A)  1 hemodyn stable on milrinone and nitro 2 paced rhythm- on amiodarone for intra-op afib 3 leukocytosis- prob SIRS 4 thrombocytopenia- monitor 6 ABL expected anemia- monitor 7 BUN/Creat up some monitor, good UOP, some volume overload- will need diuresis 8 transition from glucommander per routine 9 d/c tubes after dangle if not too much drainage 10 routine pulm toilet and rehab   [name] PA-C 08/27/2018 8:33 AM  Pager [phone]

## 2018-08-27 NOTE — Progress Notes (Signed)
Patient ID: Sean [name], male   DOB: 06-29-1941, 77 y.o.   MRN: [id number] EVENING ROUNDS NOTE :     [address]       [place] [address]             [phone]                 1 Day Post-Op Procedure(s) (LRB): CORONARY ARTERY BYPASS GRAFTING (CABG) x3 , Using left internal mammory artery and right leg greater saphronious vein. Harvested endoscopically. (N/A) TRANSESOPHAGEAL ECHOCARDIOGRAM (TEE) (N/A) LV PACING LEAD PLACEMENT (N/A)  Total Length of Stay:  LOS: 1 day  BP 133/69   Pulse 87   Temp 98.2 F (36.8 C) (Oral)   Resp 13   Wt 103.9 kg   SpO2 93%   BMI 36.42 kg/m   .Intake/Output      10/07 0701 - 10/08 0700 10/08 0701 - 10/09 0700   P.O.  720   I.V. (mL/kg) 3023.3 (29.1) 341.3 (3.3)   Blood 1335    IV Piggyback 800.2 320.9   Total Intake(mL/kg) 5158.4 (49.6) 1382.2 (13.3)   Urine (mL/kg/hr) 2485 (1) 650 (0.6)   Blood 1500    Chest Tube 370 80   Total Output 4355 730   Net +803.4 +652.2          . sodium chloride Stopped (08/27/18 1059)  . sodium chloride    . sodium chloride Stopped (08/27/18 0900)  . cefUROXime (ZINACEF)  IV Stopped (08/27/18 1520)  . lactated ringers    . lactated ringers 10 mL/hr at 08/27/18 1800  . milrinone 0.25 mcg/kg/min (08/27/18 1800)  . phenylephrine (NEO-SYNEPHRINE) Adult infusion Stopped (08/27/18 0410)     Lab Results  Component Value Date   WBC 20.7 (H) 08/27/2018   HGB 10.2 (L) 08/27/2018   HCT 30.0 (L) 08/27/2018   PLT 123 (L) 08/27/2018   GLUCOSE 161 (H) 08/27/2018   CHOL 116 06/26/2018   TRIG 64 06/26/2018   HDL 43 06/26/2018   LDLCALC 60 06/26/2018   ALT 23 08/22/2018   AST 27 08/22/2018   NA 137 08/27/2018   K 4.5 08/27/2018   CL 101 08/27/2018   CREATININE 1.60 (H) 08/27/2018   BUN 35 (H) 08/27/2018   CO2 23 08/27/2018   TSH 3.141 06/26/2018   INR 1.61 08/26/2018   HGBA1C 6.0 (H) 08/22/2018   Pod 1 Some dizzyness walking Mild nausea On milrinone   Grace Isaac  MD  Beeper [phone] Office [phone] 08/27/2018 6:07 PM

## 2018-08-27 NOTE — Plan of Care (Signed)
  Problem: Clinical Measurements: Goal: Respiratory complications will improve Outcome: Progressing   Problem: Activity: Goal: Risk for activity intolerance will decrease Outcome: Progressing   Problem: Pain Managment: Goal: General experience of comfort will improve Outcome: Progressing   

## 2018-08-27 NOTE — Op Note (Signed)
NAME: Sean [name], Sean [name] MEDICAL RECORD [id number] ACCOUNT [id number] DATE OF BIRTH:1941-07-20 FACILITY: [facility] LOCATION: [facility] PHYSICIAN:Artist Bloom VAN TRIGT III, MD  OPERATIVE REPORT  DATE OF PROCEDURE:  08/26/2018  OPERATION: 1.  Coronary artery bypass grafting x3 (left internal mammary artery to left anterior descending, saphenous vein graft to diagonal, saphenous vein graft to circumflex marginal). 2.  Endoscopic harvest of right leg greater saphenous vein. 3.  Placement of permanent epicardial left ventricular pacing lead for possible later biventricular pacing.  SURGEON:  Len Childs, MD  ASSISTANT:  Jadene Pierini, PA-C  ANESTHESIA:  General by Renold Don, MD  PREOPERATIVE DIAGNOSES:  Severe 3-vessel coronary artery disease, ischemic cardiomyopathy, ejection fraction 40%.  POSTOPERATIVE DIAGNOSES:  Severe 3-vessel coronary artery disease, ischemic cardiomyopathy, ejection fraction 40%.  CLINICAL NOTE:  The patient is a 77 year old gentleman with symptoms of heart failure and a chronically depressed LV ejection fraction of 40%.  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  Because of his LV  dysfunction, cardiac catheterization was performed to study his coronary arteries.  This was determined to demonstrate a small nondominant right coronary, which was severely diseased and atretic, high-grade proximal LAD diagonal stenosis and high-grade  proximal circumflex stenosis.  Surgical coronary revascularization was recommended by his cardiologist.  I examined the patient after reviewing his cardiac cath and echo images and agreed with the recommendation as CABG would be the best long-term  therapy for this individual.  His EP cardiologist recommended placement of an epicardial pacing lead at the time of surgery to provide possible cardiac resynchronization therapy for his reduced LV function.  Prior to the surgery after cardiac catheterization,  the patient developed a DVT in the left axillary and subclavian veins and severe left arm swelling, which required several weeks of anticoagulation and physical therapy before this operation was  scheduled.  Prior to surgery, I saw the patient on several occasions in the office and discussed the details of surgery and the operation with the patient and his wife.  We also discussed the potential risks to him of coronary artery bypass surgery including the  risk of stroke, bleeding, blood transfusion requirement, postoperative infection, postoperative pulmonary problems including pleural effusion, postoperative organ failure, and death.  After reviewing these issues, he demonstrated his understanding and  agreed to proceed with surgery under what I felt was an informed consent.  OPERATIVE FINDINGS: 1.  Nongraftable right coronary artery due to small size and diffuse disease. 2.  Adequate conduit and heavily calcified targets. 3.  Improvement in global LV function after separations from cardiopulmonary bypass after successful CABG x3. 4.  Lateral LV epicardial pacing lead was placed, and the end of it was tunneled to the left upper chest close to the previously placed pacemaker pocket.  DESCRIPTION OF PROCEDURE:  The patient was brought to the operating room and placed supine on the operating table.  General anesthesia was induced.  A transesophageal echo probe was placed by the anesthesia team.  The patient was prepped and draped as a  sterile field.  A sternal incision was made as the saphenous vein was harvested endoscopically from the left leg.  The left internal mammary artery was harvested as a pedicle graft.  The sternal retractor was placed using the deep blades because of the  patient's obese body habitus.  The pericardium was opened and suspended.  Pursestrings were placed in the ascending aorta and right atrium.  After heparin  was administered and the ACT was documented as being therapeutic,  the patient was cannulated and  placed on cardiopulmonary bypass.  The coronaries were identified for grafting, and the LAD, diagonal and OM were found to be adequate targets.  Cardioplegia cannulas were placed, both antegrade and retrograde cold blood cardioplegia, and the patient was  cooled to 32 degrees.  The aortic crossclamp was applied, and 1 L of cold blood cardioplegia was delivered in split doses between the antegrade aortic and retrograde coronary sinus catheters.  There was good cardioplegic arrest, and supple temperature  dropped less than 12 degrees.  Cardioplegia was delivered every 20 minutes.  The distal coronary anastomoses were performed.  First, distal anastomosis was to the circumflex marginal.  This was a 1.7 mm vessel, proximal 90% stenosis.  Reverse saphenous vein was sewn end-to-side with running 7-0 Prolene with good flow through the  graft.  Cardioplegia was redosed.  The second distal anastomosis was to the second diagonal branch of LAD.  This had a heavily calcified proximal ostium with a 90% stenosis.  The vein was sewn end-to-side with running 7-0 Prolene with good flow through the graft.  Cardioplegia was  redosed.  At this point, the screw-in electrode was placed in the mid lateral left ventricle and secured to the epicardium with 5-0 Prolene sutures.  The end of the lead was later tunneled to the chest wall to the area of the previously placed pacemaker but in a  separate incision.  Next, the left IMA was brought through the pericardium and sewn to the mid-LAD.  It was totally occluded proximally and had good distal runoff.  The anastomosis was end-to-side with running 8-0 Prolene, and there was excellent flow through the graft.   Cardioplegia was redosed.  The pedicle was secured to the epicardium with 6-0 Prolenes.  After cardioplegia was redosed, 2 proximal vein anastomoses were placed on the ascending aorta using a 4.5 mm punch and running 6-0 Prolene.  Prior to  removing the crossclamp, the aorta was vented from the coronaries with a dose of retrograde warm blood  cardioplegia.  The crossclamp was removed.  The vein grafts were deaired and opened.  He had good flow, and hemostasis was documented at the proximal and distal sites.  The patient was rewarmed to reperfuse.  Temporary pacing wires were applied.  The lungs were expanded.  The ventilator was  resumed.  The patient was weaned off cardiopulmonary bypass on low-dose milrinone.  Echo showed some global LV function improvement.  Protamine was administered without adverse reaction.  The cannula was removed.  The mediastinum was irrigated.  The  superior pericardial fat was closed over the aorta.  The anterior mediastinum and left pleural chest tubes were placed and brought out through separate incisions.  The sternum was closed with a wire.  The pectoralis fascia was closed with a running #1  Vicryl.  The patient remained stable.  The subcutaneous and skin layers were closed with a running Vicryl.  Total cardiopulmonary bypass time was 112 minutes.  The patient returned to the ICU in stable condition.  LN/NUANCE  D:08/26/2018 T:08/26/2018 JOB:[id number]

## 2018-08-27 NOTE — Anesthesia Postprocedure Evaluation (Signed)
Anesthesia Post Note  Patient: Sean [name]  Procedure(s) Performed: CORONARY ARTERY BYPASS GRAFTING (CABG) x3 , Using left internal mammory artery and right leg greater saphronious vein. Harvested endoscopically. (N/A Chest) TRANSESOPHAGEAL ECHOCARDIOGRAM (TEE) (N/A ) LV PACING LEAD PLACEMENT (N/A )     Patient location during evaluation: ICU Anesthesia Type: General Level of consciousness: awake and alert Pain management: pain level controlled Vital Signs Assessment: post-procedure vital signs reviewed and stable Respiratory status: spontaneous breathing, respiratory function stable, nonlabored ventilation and patient connected to nasal cannula oxygen Cardiovascular status: stable Postop Assessment: no apparent nausea or vomiting Anesthetic complications: no Comments: Extubated POD#0, doing well.    Last Vitals:  Vitals:   08/27/18 0730 08/27/18 0800  BP:  129/68  Pulse: 90 94  Resp: 14 16  Temp: (!) 36.2 C (!) 36.2 C  SpO2: 95% 93%    Last Pain:  Vitals:   08/27/18 0730  TempSrc: Core (Comment)  PainSc:                  Audry Pili

## 2018-08-28 ENCOUNTER — Inpatient Hospital Stay (HOSPITAL_COMMUNITY): Payer: PPO

## 2018-08-28 LAB — BASIC METABOLIC PANEL
Anion gap: 7 (ref 5–15)
BUN: 34 mg/dL — ABNORMAL HIGH (ref 8–23)
CO2: 26 mmol/L (ref 22–32)
Calcium: 8.1 mg/dL — ABNORMAL LOW (ref 8.9–10.3)
Chloride: 101 mmol/L (ref 98–111)
Creatinine, Ser: 1.54 mg/dL — ABNORMAL HIGH (ref 0.61–1.24)
GFR calc Af Amer: 49 mL/min — ABNORMAL LOW (ref >60)
GFR calc non Af Amer: 42 mL/min — ABNORMAL LOW (ref >60)
Glucose, Bld: 136 mg/dL — ABNORMAL HIGH (ref 70–99)
Potassium: 4.1 mmol/L (ref 3.5–5.1)
Sodium: 134 mmol/L — ABNORMAL LOW (ref 135–145)

## 2018-08-28 LAB — CBC
HCT: 29.6 % — ABNORMAL LOW (ref 39.0–52.0)
Hemoglobin: 9.5 g/dL — ABNORMAL LOW (ref 13.0–17.0)
MCH: 32.3 pg (ref 26.0–34.0)
MCHC: 32.1 g/dL (ref 30.0–36.0)
MCV: 100.7 fL — ABNORMAL HIGH (ref 80.0–100.0)
Platelets: 108 10*3/uL — ABNORMAL LOW (ref 150–400)
RBC: 2.94 MIL/uL — ABNORMAL LOW (ref 4.22–5.81)
RDW: 13.7 % (ref 11.5–15.5)
WBC: 18 10*3/uL — ABNORMAL HIGH (ref 4.0–10.5)
nRBC: 0 % (ref 0.0–0.2)

## 2018-08-28 LAB — GLUCOSE, CAPILLARY
Glucose-Capillary: 117 mg/dL — ABNORMAL HIGH (ref 70–99)
Glucose-Capillary: 112 mg/dL — ABNORMAL HIGH (ref 70–99)
Glucose-Capillary: 119 mg/dL — ABNORMAL HIGH (ref 70–99)
Glucose-Capillary: 120 mg/dL — ABNORMAL HIGH (ref 70–99)
Glucose-Capillary: 134 mg/dL — ABNORMAL HIGH (ref 70–99)

## 2018-08-28 LAB — PROTIME-INR
INR: 1.4
Prothrombin Time: 17.1 seconds — ABNORMAL HIGH (ref 11.4–15.2)

## 2018-08-28 LAB — MAGNESIUM: Magnesium: 2.5 mg/dL — ABNORMAL HIGH (ref 1.7–2.4)

## 2018-08-28 MED ORDER — WARFARIN - PHYSICIAN DOSING INPATIENT
Freq: Every day | Status: DC
  Administered 2018-08-28 – 2018-09-01 (×3)

## 2018-08-28 MED ORDER — PNEUMOCOCCAL VAC POLYVALENT 25 MCG/0.5ML IJ INJ: 0.5000 mL | INJECTION | INTRAMUSCULAR | Status: DC

## 2018-08-28 MED ORDER — INFLUENZA VAC SPLIT HIGH-DOSE 0.5 ML IM SUSY
0.5000 mL | PREFILLED_SYRINGE | INTRAMUSCULAR | Status: DC
  Filled 2018-08-28: qty 0.5

## 2018-08-28 MED ORDER — WARFARIN SODIUM 2 MG PO TABS
4.0000 mg | ORAL_TABLET | Freq: Every day | ORAL | Status: DC
  Administered 2018-08-28: 4 mg via ORAL
  Filled 2018-08-28: qty 2

## 2018-08-28 MED FILL — Albumin, Human Inj 5%: INTRAVENOUS | Qty: 250 | Status: AC

## 2018-08-28 MED FILL — Potassium Chloride Inj 2 mEq/ML: INTRAVENOUS | Qty: 40 | Status: AC

## 2018-08-28 MED FILL — Sodium Bicarbonate IV Soln 8.4%: INTRAVENOUS | Qty: 50 | Status: AC

## 2018-08-28 MED FILL — Mannitol IV Soln 20%: INTRAVENOUS | Qty: 500 | Status: AC

## 2018-08-28 MED FILL — Sodium Chloride IV Soln 0.9%: INTRAVENOUS | Qty: 2000 | Status: AC

## 2018-08-28 MED FILL — Electrolyte-R (PH 7.4) Solution: INTRAVENOUS | Qty: 4000 | Status: AC

## 2018-08-28 MED FILL — Lidocaine HCl(Cardiac) IV PF Soln Pref Syr 100 MG/5ML (2%): INTRAVENOUS | Qty: 5 | Status: AC

## 2018-08-28 MED FILL — Magnesium Sulfate Inj 50%: INTRAMUSCULAR | Qty: 10 | Status: AC

## 2018-08-28 MED FILL — Heparin Sodium (Porcine) Inj 1000 Unit/ML: INTRAMUSCULAR | Qty: 30 | Status: AC

## 2018-08-28 MED FILL — Heparin Sodium (Porcine) Inj 1000 Unit/ML: INTRAMUSCULAR | Qty: 10 | Status: AC

## 2018-08-28 NOTE — Progress Notes (Addendum)
[address]       [place] [address]             [phone]      2 Days Post-Op Procedure(s) (LRB): CORONARY ARTERY BYPASS GRAFTING (CABG) x3 , Using left internal mammory artery and right leg greater saphronious vein. Harvested endoscopically. (N/A) TRANSESOPHAGEAL ECHOCARDIOGRAM (TEE) (N/A) LV PACING LEAD PLACEMENT (N/A) Subjective: Generally feels poorly, fairly nonspecific  Objective: Vital signs in last 24 hours: Temp:  [97.2 F (36.2 C)-98.4 F (36.9 C)] 97.8 F (36.6 C) (10/09 0800) Pulse Rate:  [46-110] 58 (10/09 0800) Cardiac Rhythm: Atrial paced;Normal sinus rhythm (10/09 0730) Resp:  [9-34] 21 (10/09 0800) BP: (97-147)/(52-110) 101/58 (10/09 0800) SpO2:  [91 %-100 %] 98 % (10/09 0800) Arterial Line BP: (152-182)/(56-72) 152/56 (10/08 1130) Weight:  [104.3 kg] 104.3 kg (10/09 0600)  Hemodynamic parameters for last 24 hours: PAP: (25-39)/(21-26) 25/21  Intake/Output from previous day: 10/08 0701 - 10/09 0700 In: 1715.4 [P.O.:720; I.V.:574.5; IV Piggyback:421] Out: 3329 [Urine:1625; Chest Tube:80] Intake/Output this shift: Total I/O In: 34.7 [I.V.:34.7] Out: 50 [Urine:50]  General appearance: alert, cooperative, distracted, flushed and no distress Heart: regular rate and rhythm Lungs: mildldim in bases Abdomen: soft, non-tender Extremities: some edema UE's and LE's Wound: chest dressing CDI  Lab Results: Recent Labs    08/27/18 1633 08/27/18 1641 08/28/18 0337  WBC 20.7*  --  18.0*  HGB 9.9* 10.2* 9.5*  HCT 31.5* 30.0* 29.6*  PLT 123*  --  108*   BMET:  Recent Labs    08/27/18 0500  08/27/18 1641 08/28/18 0337  NA 138  --  137 134*  K 4.1  --  4.5 4.1  CL 108  --  101 101  CO2 23  --   --  26  GLUCOSE 120*  --  161* 136*  BUN 29*  --  35* 34*  CREATININE 1.41*   < > 1.60* 1.54*  CALCIUM 8.3*  --   --  8.1*   < > = values in this interval not displayed.    PT/INR:  Recent Labs    08/28/18 0337  LABPROT  17.1*  INR 1.40   ABG    Component Value Date/Time   PHART 7.341 (L) 08/26/2018 2002   HCO3 23.3 08/26/2018 2002   TCO2 26 08/27/2018 1641   ACIDBASEDEF 3.0 (H) 08/26/2018 2002   O2SAT 96.0 08/26/2018 2002   CBG (last 3)  Recent Labs    08/27/18 2009 08/27/18 2337 08/28/18 0748  GLUCAP 154* 122* 112*    Meds Scheduled Meds: . acetaminophen  1,000 mg Oral Q6H   Or  . acetaminophen (TYLENOL) oral liquid 160 mg/5 mL  1,000 mg Per Tube Q6H  . aspirin EC  325 mg Oral Daily   Or  . aspirin  324 mg Per Tube Daily  . atorvastatin  40 mg Oral Daily  . bisacodyl  10 mg Oral Daily   Or  . bisacodyl  10 mg Rectal Daily  . Chlorhexidine Gluconate Cloth  6 each Topical Daily  . Chlorhexidine Gluconate Cloth  6 each Topical Daily  . docusate sodium  200 mg Oral Daily  . furosemide  40 mg Intravenous BID  . [START ON 08/29/2018] Influenza vac split quadrivalent PF  0.5 mL Intramuscular Tomorrow-1000  . insulin aspart  0-24 Units Subcutaneous Q4H  . insulin aspart  4 Units Subcutaneous TID WC  . insulin detemir  12 Units Subcutaneous BID  .  metoCLOPramide (REGLAN) injection  5 mg Intravenous Q8H  . metoprolol tartrate  12.5 mg Oral BID   Or  . metoprolol tartrate  12.5 mg Per Tube BID  . pantoprazole  40 mg Oral Daily  . [START ON 08/29/2018] pneumococcal 23 valent vaccine  0.5 mL Intramuscular Tomorrow-1000  . sodium chloride flush  10-40 mL Intracatheter Q12H  . sodium chloride flush  10-40 mL Intracatheter Q12H  . sodium chloride flush  3 mL Intravenous Q12H   Continuous Infusions: . sodium chloride Stopped (08/27/18 1059)  . sodium chloride    . sodium chloride Stopped (08/27/18 0900)  . lactated ringers    . lactated ringers 10 mL/hr at 08/28/18 0800  . milrinone 0.25 mcg/kg/min (08/28/18 0800)   PRN Meds:.sodium chloride, metoprolol tartrate, midazolam, morphine injection, ondansetron (ZOFRAN) IV, oxyCODONE, sodium chloride flush, sodium chloride flush, sodium chloride  flush, traMADol  Xrays Dg Chest Port 1 View  Result Date: 08/27/2018 CLINICAL DATA:  Follow-up coronary bypass grafting EXAM: PORTABLE CHEST 1 VIEW COMPARISON:  08/26/2018 FINDINGS: Endotracheal tube and nasogastric catheter have been removed in the interval. The mediastinal drain and thoracostomy catheter on the left are stable in appearance. No pneumothorax is seen. Pacing device is again noted and stable. Swan-Ganz catheter in the right pulmonary artery is seen. The overall inspiratory effort is poor with mild left basilar atelectasis stable from the prior exam. Postsurgical changes are stable. IMPRESSION: Mild left basilar atelectasis. Tubes and lines as described above. No pneumothorax is noted. Electronically Signed   By: Inez Catalina M.D.   On: 08/27/2018 09:48   Dg Chest Port 1 View  Result Date: 08/26/2018 CLINICAL DATA:  Status post CABG surgery. EXAM: PORTABLE CHEST 1 VIEW COMPARISON:  08/22/2018 FINDINGS: Since the prior exam, CABG surgery has been performed. The standard lines and tubes are in place, including: An endotracheal tube with its tip 3.4 cm above the [place], a right internal jugular Swan-Ganz catheter with its tip directed into the right pulmonary artery, a nasal/orogastric tube passing below the diaphragm well into the stomach, a mediastinal tube and a left-sided chest tube. There is opacity at the left lung base obscuring the hemidiaphragm consistent with a small effusion and atelectasis. Minor atelectasis is noted in the left apex adjacent to the tip of the chest tube. There is no evidence of pulmonary edema. No pneumothorax. No mediastinal widening. Left anterior chest wall pacemaker appears stable from the preoperative exam. IMPRESSION: 1. Status post CABG surgery. No evidence of an operative complication. 2. Support apparatus is well positioned. 3. Small left pleural effusion with left lung base atelectasis. No pulmonary edema, pneumothorax or mediastinal widening.  Electronically Signed   By: Lajean Manes M.D.   On: 08/26/2018 13:57    Assessment/Plan: S/P Procedure(s) (LRB): CORONARY ARTERY BYPASS GRAFTING (CABG) x3 , Using left internal mammory artery and right leg greater saphronious vein. Harvested endoscopically. (N/A) TRANSESOPHAGEAL ECHOCARDIOGRAM (TEE) (N/A) LV PACING LEAD PLACEMENT (N/A)  1 overall progress is good, feels poorly 2 hemodyn stable in sinus/paced rhythms- will increase pacer rate with temp pacer 3 will decrease milrinone to .125 4 diurese for volume overload 5 leukocytosis improving trend 6 ABL anemia- expected, fairly stable, sligh macrocytotic component 7 thrombocytopenia trend is lower- monitor, hopefully will not require further evaluation 8 renal insuff is fairly stable with creat in 1.5-1.6 range 9 BS well controlled, hgb A1C is 6.0 10 push rehab and pulm toilet as able for atx 11 restart coumadin at 4 mg/daily INR  LOS: 2 days    Sean Giovanni PA-C 08/28/2018 Pager [phone]  Cont iv lasix Check co-ox in am  patient examined and medical record reviewed,agree with above note. Sean [name] 08/28/2018

## 2018-08-28 NOTE — Progress Notes (Addendum)
Assisted pt out of bed to ambulate (2 person assist) with avasys walker, on 2 L [place], on cardiac monitor & followed with chair. Pt desat 88-89% during ambulation, O2 increased to 3L [place]. Pt needed to be frequently reminded to continue breathing during walks. O2 sat 90-92% on 3L [place] during ambulation. Pt ambulated ~ 48 feet down hall. Pt required 2 rest breaks before sitting in chair & wheeled back to room. Assessment ongoing.

## 2018-08-28 NOTE — Consult Note (Signed)
   [facility] CM Inpatient Consult   08/28/2018  Sean [name] 1941-09-05 [id number]  Patient screened for high risk for unplanned readmission in the [employer] plan in [place].  Chart reviewed and patient is S/P CABG.  Was rounding on unit and patient was working with staff getting up.  Already made inpatient RNCM, Lanelle Bal aware that patient is eligible for [facility] Management services.  Will continue to follow for progress and disposition needs.   Chart review reveals patient's primary care provider is Dr. [name] of [facility]. For questions contact:   Natividad Brood, RN BSN [facility] Liaison  [phone] business mobile phone Toll free office [phone]

## 2018-08-28 NOTE — Progress Notes (Signed)
Patient ID: Sean [name], male   DOB: Mar 29, 1941, 77 y.o.   MRN: [id number] TCTS Evening Rounds:  Hemodynamically stable on milrinone 0.125 Sats 97% Diuresing.

## 2018-08-29 ENCOUNTER — Inpatient Hospital Stay (HOSPITAL_COMMUNITY): Payer: PPO

## 2018-08-29 LAB — BASIC METABOLIC PANEL
Anion gap: 8 (ref 5–15)
BUN: 33 mg/dL — ABNORMAL HIGH (ref 8–23)
CO2: 27 mmol/L (ref 22–32)
Calcium: 8.1 mg/dL — ABNORMAL LOW (ref 8.9–10.3)
Chloride: 99 mmol/L (ref 98–111)
Creatinine, Ser: 1.37 mg/dL — ABNORMAL HIGH (ref 0.61–1.24)
GFR calc Af Amer: 56 mL/min — ABNORMAL LOW (ref >60)
GFR calc non Af Amer: 49 mL/min — ABNORMAL LOW (ref >60)
Glucose, Bld: 114 mg/dL — ABNORMAL HIGH (ref 70–99)
Potassium: 3.8 mmol/L (ref 3.5–5.1)
Sodium: 134 mmol/L — ABNORMAL LOW (ref 135–145)

## 2018-08-29 LAB — GLUCOSE, CAPILLARY
Glucose-Capillary: 115 mg/dL — ABNORMAL HIGH (ref 70–99)
Glucose-Capillary: 96 mg/dL (ref 70–99)
Glucose-Capillary: 107 mg/dL — ABNORMAL HIGH (ref 70–99)
Glucose-Capillary: 111 mg/dL — ABNORMAL HIGH (ref 70–99)
Glucose-Capillary: 125 mg/dL — ABNORMAL HIGH (ref 70–99)
Glucose-Capillary: 104 mg/dL — ABNORMAL HIGH (ref 70–99)
Glucose-Capillary: 126 mg/dL — ABNORMAL HIGH (ref 70–99)

## 2018-08-29 LAB — CBC
HCT: 28.9 % — ABNORMAL LOW (ref 39.0–52.0)
Hemoglobin: 9 g/dL — ABNORMAL LOW (ref 13.0–17.0)
MCH: 31.6 pg (ref 26.0–34.0)
MCHC: 31.1 g/dL (ref 30.0–36.0)
MCV: 101.4 fL — ABNORMAL HIGH (ref 80.0–100.0)
Platelets: 93 10*3/uL — ABNORMAL LOW (ref 150–400)
RBC: 2.85 MIL/uL — ABNORMAL LOW (ref 4.22–5.81)
RDW: 13.5 % (ref 11.5–15.5)
WBC: 14.4 10*3/uL — ABNORMAL HIGH (ref 4.0–10.5)
nRBC: 0 % (ref 0.0–0.2)

## 2018-08-29 LAB — COOXEMETRY PANEL
Carboxyhemoglobin: 1.9 % — ABNORMAL HIGH (ref 0.5–1.5)
Methemoglobin: 1.3 % (ref 0.0–1.5)
O2 Saturation: 81.5 %
Total hemoglobin: 8.8 g/dL — ABNORMAL LOW (ref 12.0–16.0)

## 2018-08-29 LAB — PROTIME-INR
INR: 1.36
Prothrombin Time: 16.7 seconds — ABNORMAL HIGH (ref 11.4–15.2)

## 2018-08-29 MED ORDER — SODIUM CHLORIDE 0.9% FLUSH: 3.0000 mL | INTRAVENOUS | Status: DC | PRN

## 2018-08-29 MED ORDER — LACTULOSE 10 GM/15ML PO SOLN
30.0000 g | Freq: Every day | ORAL | Status: DC
  Administered 2018-08-29: 30 g via ORAL
  Filled 2018-08-29: qty 45

## 2018-08-29 MED ORDER — SODIUM CHLORIDE 0.9 % IV SOLN: 250.0000 mL | INTRAVENOUS | Status: DC | PRN

## 2018-08-29 MED ORDER — MOVING RIGHT ALONG BOOK
Freq: Once | Status: AC
  Administered 2018-08-29: 09:00:00
  Filled 2018-08-29: qty 1

## 2018-08-29 MED ORDER — POTASSIUM CHLORIDE CRYS ER 20 MEQ PO TBCR
20.0000 meq | EXTENDED_RELEASE_TABLET | Freq: Two times a day (BID) | ORAL | Status: DC
  Administered 2018-08-29 – 2018-09-02 (×9): 20 meq via ORAL
  Filled 2018-08-29 (×9): qty 1

## 2018-08-29 MED ORDER — INSULIN ASPART 100 UNIT/ML ~~LOC~~ SOLN
0.0000 [IU] | Freq: Three times a day (TID) | SUBCUTANEOUS | Status: DC
  Administered 2018-08-29 – 2018-08-30 (×2): 2 [IU] via SUBCUTANEOUS
  Administered 2018-08-30: 1 [IU] via SUBCUTANEOUS
  Administered 2018-08-31: 3 [IU] via SUBCUTANEOUS
  Administered 2018-09-01 (×2): 2 [IU] via SUBCUTANEOUS
  Administered 2018-09-02: 4 [IU] via SUBCUTANEOUS
  Administered 2018-09-03: 2 [IU] via SUBCUTANEOUS

## 2018-08-29 MED ORDER — POTASSIUM CHLORIDE 10 MEQ/50ML IV SOLN
10.0000 meq | INTRAVENOUS | Status: AC
  Administered 2018-08-29 (×3): 10 meq via INTRAVENOUS
  Filled 2018-08-29 (×3): qty 50

## 2018-08-29 MED ORDER — WARFARIN SODIUM 5 MG PO TABS
5.0000 mg | ORAL_TABLET | Freq: Every day | ORAL | Status: DC
  Administered 2018-08-29: 5 mg via ORAL
  Filled 2018-08-29: qty 1

## 2018-08-29 MED ORDER — SODIUM CHLORIDE 0.9% FLUSH
3.0000 mL | Freq: Two times a day (BID) | INTRAVENOUS | Status: DC
  Administered 2018-08-29: 3 mL via INTRAVENOUS

## 2018-08-29 NOTE — Progress Notes (Signed)
Apolonio Schneiders, RN made aware of the central line removal order and will remove the central line.  Gerlene Burdock, VAST

## 2018-08-29 NOTE — Progress Notes (Addendum)
[address]       [place] [address]             [phone]      3 Days Post-Op Procedure(s) (LRB): CORONARY ARTERY BYPASS GRAFTING (CABG) x3 , Using left internal mammory artery and right leg greater saphronious vein. Harvested endoscopically. (N/A) TRANSESOPHAGEAL ECHOCARDIOGRAM (TEE) (N/A) LV PACING LEAD PLACEMENT (N/A) Subjective: Feels much better than yesterday   Objective: Vital signs in last 24 hours: Temp:  [97.6 F (36.4 C)-98.4 F (36.9 C)] 98.1 F (36.7 C) (10/10 0741) Pulse Rate:  [59-100] 92 (10/10 0600) Cardiac Rhythm: Atrial paced (10/10 0000) Resp:  [13-35] 19 (10/10 0600) BP: (95-142)/(55-81) 142/72 (10/10 0500) SpO2:  [91 %-100 %] 97 % (10/10 0600) Weight:  [104.3 kg] 104.3 kg (10/10 0600)  Hemodynamic parameters for last 24 hours:    Intake/Output from previous day: 10/09 0701 - 10/10 0700 In: 199.5 [I.V.:199.5] Out: 2225 [Urine:2225] Intake/Output this shift: No intake/output data recorded.  General appearance: alert, cooperative and no distress Heart: regular rate and rhythm Lungs: dim in bases Abdomen: benign Extremities: + edema Wound: incis healing well  Lab Results: Recent Labs    08/28/18 0337 08/29/18 0500  WBC 18.0* 14.4*  HGB 9.5* 9.0*  HCT 29.6* 28.9*  PLT 108* PENDING   BMET:  Recent Labs    08/28/18 0337 08/29/18 0500  NA 134* 134*  K 4.1 3.8  CL 101 99  CO2 26 27  GLUCOSE 136* 114*  BUN 34* 33*  CREATININE 1.54* 1.37*  CALCIUM 8.1* 8.1*    PT/INR:  Recent Labs    08/29/18 0500  LABPROT 16.7*  INR 1.36   ABG    Component Value Date/Time   PHART 7.341 (L) 08/26/2018 2002   HCO3 23.3 08/26/2018 2002   TCO2 26 08/27/2018 1641   ACIDBASEDEF 3.0 (H) 08/26/2018 2002   O2SAT 81.5 08/29/2018 0527   CBG (last 3)  Recent Labs    08/29/18 0223 08/29/18 0509 08/29/18 0734  GLUCAP 115* 96 107*    Meds Scheduled Meds: . acetaminophen  1,000 mg Oral Q6H   Or  . acetaminophen  (TYLENOL) oral liquid 160 mg/5 mL  1,000 mg Per Tube Q6H  . aspirin EC  325 mg Oral Daily   Or  . aspirin  324 mg Per Tube Daily  . atorvastatin  40 mg Oral Daily  . bisacodyl  10 mg Oral Daily   Or  . bisacodyl  10 mg Rectal Daily  . Chlorhexidine Gluconate Cloth  6 each Topical Daily  . Chlorhexidine Gluconate Cloth  6 each Topical Daily  . docusate sodium  200 mg Oral Daily  . furosemide  40 mg Intravenous BID  . Influenza vac split quadrivalent PF  0.5 mL Intramuscular Tomorrow-1000  . insulin aspart  0-24 Units Subcutaneous TID AC & HS  . insulin aspart  4 Units Subcutaneous TID WC  . insulin detemir  12 Units Subcutaneous BID  . metoprolol tartrate  12.5 mg Oral BID   Or  . metoprolol tartrate  12.5 mg Per Tube BID  . moving right along book   Does not apply Once  . pantoprazole  40 mg Oral Daily  . pneumococcal 23 valent vaccine  0.5 mL Intramuscular Tomorrow-1000  . potassium chloride  20 mEq Oral BID  . sodium chloride flush  10-40 mL Intracatheter Q12H  . sodium chloride flush  10-40 mL Intracatheter Q12H  . sodium chloride flush  3 mL Intravenous Q12H  . sodium chloride flush  3 mL Intravenous Q12H  . warfarin  5 mg Oral q1800  . Warfarin - Physician Dosing Inpatient   Does not apply q1800   Continuous Infusions: . sodium chloride Stopped (08/27/18 1059)  . sodium chloride    . sodium chloride Stopped (08/27/18 0900)  . sodium chloride     PRN Meds:.sodium chloride, sodium chloride, metoprolol tartrate, ondansetron (ZOFRAN) IV, oxyCODONE, sodium chloride flush, sodium chloride flush, sodium chloride flush, sodium chloride flush, traMADol  Xrays Dg Chest Port 1 View  Result Date: 08/28/2018 CLINICAL DATA:  Postop day 2 CABG. Swan-Ganz catheter and LEFT chest tube removal. EXAM: PORTABLE CHEST 1 VIEW COMPARISON:  08/27/2018, 08/26/2018 and earlier. FINDINGS: Sternotomy for CABG. Interval Swan-Ganz catheter and LEFT chest tube removal. RIGHT jugular introducer sheath  tip projects over the UPPER SVC. LEFT subclavian transvenous pacemaker unchanged. No pneumothorax. Small to moderate-sized LEFT pleural effusion, with associated dense consolidation in the LEFT LOWER LOBE, unchanged since yesterday. Minimal linear atelectasis at the RIGHT lung base, unchanged. No new pulmonary parenchymal abnormalities. 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 IMPRESSION: 1. No evidence of pneumothorax after LEFT chest tube removal. 2. Stable small to moderate-sized LEFT pleural effusion and associated dense passive atelectasis in the LEFT LOWER LOBE. 3. No new abnormalities. Electronically Signed   By: Evangeline Dakin M.D.   On: 08/28/2018 11:40   Dg Chest Port 1 View  Result Date: 08/27/2018 CLINICAL DATA:  Follow-up coronary bypass grafting EXAM: PORTABLE CHEST 1 VIEW COMPARISON:  08/26/2018 FINDINGS: Endotracheal tube and nasogastric catheter have been removed in the interval. The mediastinal drain and thoracostomy catheter on the left are stable in appearance. No pneumothorax is seen. Pacing device is again noted and stable. Swan-Ganz catheter in the right pulmonary artery is seen. The overall inspiratory effort is poor with mild left basilar atelectasis stable from the prior exam. Postsurgical changes are stable. IMPRESSION: Mild left basilar atelectasis. Tubes and lines as described above. No pneumothorax is noted. Electronically Signed   By: Inez Catalina M.D.   On: 08/27/2018 09:48    Assessment/Plan: S/P Procedure(s) (LRB): CORONARY ARTERY BYPASS GRAFTING (CABG) x3 , Using left internal mammory artery and right leg greater saphronious vein. Harvested endoscopically. (N/A) TRANSESOPHAGEAL ECHOCARDIOGRAM (TEE) (N/A) LV PACING LEAD PLACEMENT (N/A)  1 improved overall, hemody stable with paced rhythm- Coox 81 2 leukocytosis improved 3 H/H stable, platelet count pending 4 renal fxn trend improved, excellent diuresis, continue 5 conts coumadin 6 tx to 4 e- push  rehab and pulm toilet as able  LOS: 3 days    [name] 08/29/2018  Patient now making good progress after multivessel CABG Patient walking in hallway Intrinsic pacer rate set at 84 history of complete heart block and postoperative atrial flutter Continue diuresis and transfer to stepdown. Patient will continue preoperative Coumadin  patient examined and medical record reviewed,agree with above note. Tharon Aquas Trigt III 08/29/2018

## 2018-08-30 ENCOUNTER — Inpatient Hospital Stay (HOSPITAL_COMMUNITY): Payer: PPO

## 2018-08-30 DIAGNOSIS — R52 Pain, unspecified: Secondary | ICD-10-CM

## 2018-08-30 DIAGNOSIS — M7989 Other specified soft tissue disorders: Secondary | ICD-10-CM

## 2018-08-30 LAB — BASIC METABOLIC PANEL
Anion gap: 11 (ref 5–15)
BUN: 32 mg/dL — ABNORMAL HIGH (ref 8–23)
CO2: 27 mmol/L (ref 22–32)
Calcium: 8.7 mg/dL — ABNORMAL LOW (ref 8.9–10.3)
Chloride: 101 mmol/L (ref 98–111)
Creatinine, Ser: 1.43 mg/dL — ABNORMAL HIGH (ref 0.61–1.24)
GFR calc Af Amer: 53 mL/min — ABNORMAL LOW (ref >60)
GFR calc non Af Amer: 46 mL/min — ABNORMAL LOW (ref >60)
Glucose, Bld: 102 mg/dL — ABNORMAL HIGH (ref 70–99)
Potassium: 4.1 mmol/L (ref 3.5–5.1)
Sodium: 139 mmol/L (ref 135–145)

## 2018-08-30 LAB — CBC
HCT: 31.3 % — ABNORMAL LOW (ref 39.0–52.0)
Hemoglobin: 9.8 g/dL — ABNORMAL LOW (ref 13.0–17.0)
MCH: 31.6 pg (ref 26.0–34.0)
MCHC: 31.3 g/dL (ref 30.0–36.0)
MCV: 101 fL — ABNORMAL HIGH (ref 80.0–100.0)
Platelets: 130 10*3/uL — ABNORMAL LOW (ref 150–400)
RBC: 3.1 MIL/uL — ABNORMAL LOW (ref 4.22–5.81)
RDW: 13.3 % (ref 11.5–15.5)
WBC: 13.3 10*3/uL — ABNORMAL HIGH (ref 4.0–10.5)
nRBC: 0 % (ref 0.0–0.2)

## 2018-08-30 LAB — GLUCOSE, CAPILLARY
Glucose-Capillary: 102 mg/dL — ABNORMAL HIGH (ref 70–99)
Glucose-Capillary: 137 mg/dL — ABNORMAL HIGH (ref 70–99)
Glucose-Capillary: 79 mg/dL (ref 70–99)
Glucose-Capillary: 135 mg/dL — ABNORMAL HIGH (ref 70–99)

## 2018-08-30 LAB — PROTIME-INR
INR: 1.3
Prothrombin Time: 16 seconds — ABNORMAL HIGH (ref 11.4–15.2)

## 2018-08-30 MED ORDER — AMLODIPINE BESYLATE 5 MG PO TABS
5.0000 mg | ORAL_TABLET | Freq: Every day | ORAL | Status: DC
  Administered 2018-08-30 – 2018-08-31 (×2): 5 mg via ORAL
  Filled 2018-08-30 (×2): qty 1

## 2018-08-30 MED ORDER — FUROSEMIDE 10 MG/ML IJ SOLN: 40.0000 mg | Freq: Every day | INTRAMUSCULAR | Status: DC

## 2018-08-30 MED ORDER — ENOXAPARIN SODIUM 40 MG/0.4ML ~~LOC~~ SOLN
40.0000 mg | Freq: Every day | SUBCUTANEOUS | Status: DC
  Administered 2018-08-30 – 2018-09-03 (×5): 40 mg via SUBCUTANEOUS
  Filled 2018-08-30 (×5): qty 0.4

## 2018-08-30 MED ORDER — COLCHICINE 0.6 MG PO TABS
0.6000 mg | ORAL_TABLET | Freq: Every day | ORAL | Status: DC
  Administered 2018-08-30 – 2018-09-03 (×5): 0.6 mg via ORAL
  Filled 2018-08-30 (×5): qty 1

## 2018-08-30 MED ORDER — SODIUM CHLORIDE 0.9% FLUSH
10.0000 mL | Freq: Two times a day (BID) | INTRAVENOUS | Status: DC
  Administered 2018-08-30 – 2018-09-03 (×6): 10 mL

## 2018-08-30 MED ORDER — SODIUM CHLORIDE 0.9% FLUSH: 10.0000 mL | INTRAVENOUS | Status: DC | PRN

## 2018-08-30 MED ORDER — CEFAZOLIN SODIUM-DEXTROSE 1-4 GM/50ML-% IV SOLN
1.0000 g | Freq: Three times a day (TID) | INTRAVENOUS | Status: DC
  Administered 2018-08-30 – 2018-09-03 (×13): 1 g via INTRAVENOUS
  Filled 2018-08-30 (×13): qty 50

## 2018-08-30 MED ORDER — WARFARIN SODIUM 3 MG PO TABS
6.0000 mg | ORAL_TABLET | Freq: Every day | ORAL | Status: DC
  Administered 2018-08-30: 6 mg via ORAL
  Filled 2018-08-30: qty 2

## 2018-08-30 MED ORDER — FUROSEMIDE 40 MG PO TABS
40.0000 mg | ORAL_TABLET | Freq: Every day | ORAL | Status: DC
  Administered 2018-08-31: 40 mg via ORAL
  Filled 2018-08-30: qty 1

## 2018-08-30 NOTE — Consult Note (Signed)
   [facility] CM Inpatient Consult   08/30/2018  Sean [name] 05/05/1941 [id number]  Patient screened for potential [facility] Care Management services with his [employer] plan. Met with the patient and his wife, [name] at the bedside to introduce to [facility] Care Management services.  Patient is very pleased with his insurance.  He denies any post hospital care management needs.  His wife is interested in a home health assessment for home.  Spoke with inpatient RNCM regarding [facility] eligibility and no community needs at this time. A brochure, 24 hour nurse advise line and contact information was given and encouraged to call at anytime.   Patient could benefit from EMMI follow up call.  His primary care provider is Dr. James Hedrick.  This office provides the transition of care follow up.   Also, spoke with inpatient RNCM, Kristi regarding patient given a brochure, and [facility] Materials.  No needs currently.  Please place a [facility] Care Management consult or for questions contact:   Victoria Brewer, RN BSN CCM [facility] Liaison  [phone] business mobile phone Toll free office [phone]   

## 2018-08-30 NOTE — Plan of Care (Signed)
  Problem: Education: Goal: Knowledge of General Education information will improve Description Including pain rating scale, medication(s)/side effects and non-pharmacologic comfort measures Outcome: Progressing   Problem: Clinical Measurements: Goal: Ability to maintain clinical measurements within normal limits will improve Outcome: Progressing Goal: Respiratory complications will improve Outcome: Progressing Goal: Cardiovascular complication will be avoided Outcome: Progressing   Problem: Nutrition: Goal: Adequate nutrition will be maintained Outcome: Progressing   Problem: Pain Managment: Goal: General experience of comfort will improve Outcome: Progressing   Problem: Skin Integrity: Goal: Risk for impaired skin integrity will decrease Outcome: Progressing   Problem: Respiratory: Goal: Respiratory status will improve Outcome: Progressing   Problem: Skin Integrity: Goal: Wound healing without signs and symptoms of infection Outcome: Progressing   Problem: Urinary Elimination: Goal: Ability to achieve and maintain adequate renal perfusion and functioning will improve Outcome: Progressing

## 2018-08-30 NOTE — Progress Notes (Addendum)
[address]       [place] [address]             [phone]      4 Days Post-Op Procedure(s) (LRB): CORONARY ARTERY BYPASS GRAFTING (CABG) x3 , Using left internal mammory artery and right leg greater saphronious vein. Harvested endoscopically. (N/A) TRANSESOPHAGEAL ECHOCARDIOGRAM (TEE) (N/A) LV PACING LEAD PLACEMENT (N/A) Subjective: Feels poorly because of increase in pain and swelling in left arm, can lift off bed but unable to use hand  Objective: Vital signs in last 24 hours: Temp:  [97.5 F (36.4 C)-99.1 F (37.3 C)] 97.5 F (36.4 C) (10/11 0440) Pulse Rate:  [40-95] 40 (10/11 0441) Cardiac Rhythm: Ventricular paced (10/10 2200) Resp:  [14-22] 19 (10/11 0441) BP: (92-151)/(47-89) 151/89 (10/11 0441) SpO2:  [82 %-100 %] 82 % (10/11 0441) Weight:  [101.8 kg] 101.8 kg (10/11 0500)  Hemodynamic parameters for last 24 hours:    Intake/Output from previous day: 10/10 0701 - 10/11 0700 In: 173.7 [I.V.:27.3; IV Piggyback:146.4] Out: 900 [Urine:900] Intake/Output this shift: No intake/output data recorded.  General appearance: alert, cooperative and no distress Heart: regular rate and rhythm Lungs: dim in bases Abdomen: benign Extremities: + edema, left arm/hand swelling significant increased. Unable to use hand very well Wound: incis healing well  Lab Results: Recent Labs    08/29/18 0500 08/30/18 0411  WBC 14.4* 13.3*  HGB 9.0* 9.8*  HCT 28.9* 31.3*  PLT 93* 130*   BMET:  Recent Labs    08/29/18 0500 08/30/18 0411  NA 134* 139  K 3.8 4.1  CL 99 101  CO2 27 27  GLUCOSE 114* 102*  BUN 33* 32*  CREATININE 1.37* 1.43*  CALCIUM 8.1* 8.7*    PT/INR:  Recent Labs    08/30/18 0411  LABPROT 16.0*  INR 1.30   ABG    Component Value Date/Time   PHART 7.341 (L) 08/26/2018 2002   HCO3 23.3 08/26/2018 2002   TCO2 26 08/27/2018 1641   ACIDBASEDEF 3.0 (H) 08/26/2018 2002   O2SAT 81.5 08/29/2018 0527   CBG (last 3)  Recent  Labs    08/29/18 1704 08/29/18 2112 08/30/18 0624  GLUCAP 104* 126* 102*    Meds Scheduled Meds: . acetaminophen  1,000 mg Oral Q6H   Or  . acetaminophen (TYLENOL) oral liquid 160 mg/5 mL  1,000 mg Per Tube Q6H  . aspirin EC  325 mg Oral Daily   Or  . aspirin  324 mg Per Tube Daily  . atorvastatin  40 mg Oral Daily  . bisacodyl  10 mg Oral Daily   Or  . bisacodyl  10 mg Rectal Daily  . Chlorhexidine Gluconate Cloth  6 each Topical Daily  . Chlorhexidine Gluconate Cloth  6 each Topical Daily  . docusate sodium  200 mg Oral Daily  . furosemide  40 mg Intravenous BID  . Influenza vac split quadrivalent PF  0.5 mL Intramuscular Tomorrow-1000  . insulin aspart  0-24 Units Subcutaneous TID AC & HS  . insulin aspart  4 Units Subcutaneous TID WC  . insulin detemir  12 Units Subcutaneous BID  . lactulose  30 g Oral Daily  . metoprolol tartrate  12.5 mg Oral BID   Or  . metoprolol tartrate  12.5 mg Per Tube BID  . pantoprazole  40 mg Oral Daily  . pneumococcal 23 valent vaccine  0.5 mL Intramuscular Tomorrow-1000  . potassium chloride  20 mEq Oral BID  .  sodium chloride flush  10-40 mL Intracatheter Q12H  . sodium chloride flush  10-40 mL Intracatheter Q12H  . sodium chloride flush  3 mL Intravenous Q12H  . sodium chloride flush  3 mL Intravenous Q12H  . warfarin  5 mg Oral q1800  . Warfarin - Physician Dosing Inpatient   Does not apply q1800   Continuous Infusions: . sodium chloride Stopped (08/27/18 1059)  . sodium chloride    . sodium chloride Stopped (08/27/18 0900)  . sodium chloride     PRN Meds:.sodium chloride, sodium chloride, metoprolol tartrate, ondansetron (ZOFRAN) IV, oxyCODONE, sodium chloride flush, sodium chloride flush, sodium chloride flush, sodium chloride flush, traMADol  Xrays Dg Chest Port 1 View  Result Date: 08/29/2018 CLINICAL DATA:  Shortness of breath post CABG EXAM: PORTABLE CHEST ONE VIEW . COMPARISON:  Portable exam 0552 hours compared to  08/28/2018 FINDINGS: RIGHT jugular catheter stable with tip projecting over SVC. LEFT subclavian transvenous pacemaker leads project over RIGHT atrium and RIGHT ventricle. Enlargement of cardiac silhouette post CABG. Atherosclerotic calcification aorta. Linear subsegmental atelectasis at minor fissure and in LEFT upper lobe. Persistent atelectasis versus consolidation of LEFT lower lobe with associated LEFT pleural effusion. No pneumothorax. Advanced LEFT glenohumeral degenerative changes. IMPRESSION: Scattered atelectasis in both lungs with persistent dense atelectasis versus consolidation of LEFT lower lobe associated with LEFT pleural effusion. Enlargement of cardiac silhouette Electronically Signed   By: Lavonia Dana M.D.   On: 08/29/2018 10:54    Assessment/Plan: S/P Procedure(s) (LRB): CORONARY ARTERY BYPASS GRAFTING (CABG) x3 , Using left internal mammory artery and right leg greater saphronious vein. Harvested endoscopically. (N/A) TRANSESOPHAGEAL ECHOCARDIOGRAM (TEE) (N/A) LV PACING LEAD PLACEMENT (N/A)  1 stable but new concerns with left arm- check [place] to r/o recurrence of DVT. May need heparin in addition to coumadin. INR 1.30 2 paced rhythm(PPM), some HTN, creat increased to 1.43- will start norvasc and not ACE-I/ARB for now 3 leukocytosis improved 4 H/H improved 5 thrombocytopenia improved 6 sugars ok, A1C 6, on no meds at home- hopefully can do well with diet control, will stop insulin for now and see how he does, may benefit from metformin if elevated   LOS: 4 days    [name] PA-C 08/30/2018 Pager [phone]  L arm with swelling and discomfort- hx of preop L axillary vein DVT which was treated for several weeks preop with coumadin. Vasc US  today w/o sig DVT Will keep arm elevated, cont with coumadin reload and preventative Lovenox Cellulitis from infiltrated IV also a concern- will start  Ancef Needs Midline for adequate IVaccess Cont with CABG recovery  program  patient examined and medical record reviewed,agree with above note. Tharon Aquas Trigt III 08/30/2018

## 2018-08-30 NOTE — Progress Notes (Signed)
Patient c/o increasing pain in left arm/hand. Patient unable to grip the walker while walking and hurts to move it. A-line was placed in left wrist for surgery. Patient stated that, "it started to swell and hurt shortly after his cath that was done previously". Possible DVT or phlebistis?? Will continue to monitor

## 2018-08-30 NOTE — Progress Notes (Signed)
Preliminary notes--Left upper extremity venous duplex exam completed. Negative for DVT.  Hongying Noach Calvillo (RDMS RVT) 08/30/18 11:48 AM

## 2018-08-30 NOTE — Discharge Summary (Signed)
Physician Discharge Summary  Patient ID: Sean [name] MRN: [id number] DOB/AGE: [birth year] 77 y.o.  Admit date: 08/26/2018 Discharge date: 09/03/2018  Admission Diagnoses: Severe multivessel coronary disease  Discharge Diagnoses:  Active Problems:   S/P CABG x 3  Patient Active Problem List   Diagnosis Date Noted  . 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 Vertigo 06/26/2018  . Coronary artery disease   . Shortness of breath   . Complete heart block ([place]) 06/11/2017  . Acute kidney injury superimposed on CKD ([place]) 06/11/2017  . Obesity 06/11/2017  . Gout 06/11/2017  . 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  Approximately 1 year ago he had a permanent pacemaker for complete heart block placed.  His ejection fraction at that time was 50 to 55%.  After follow-up echocardiogram showed reduction in the LV function he underwent a CT scan which showed evidence of coronary artery disease and subsequently underwent cardiac catheterization.  The right coronary artery is small and diffusely diseased and probably not graftable.  The LAD/diagonal and circumflex have high-grade disease in the 80 to 90% range and appeared to be adequate targets.  LVEDP is 20.  Following cardiac catheterization patient developed swelling and ecchymosis and pain in his left hand up into the axilla.  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  He was started on Eliquis.  He was referred to Dahlia Byes MD for consideration of CABG.  Dr. Darcey Nora evaluated the patient and studies and recommended proceeding.  He was admitted this hospitalization for the procedure.  Discharged Condition:  Stable  Hospital Course: On 08/26/2018 the patient was taken to the operating room where he underwent the below described procedure.  He tolerated it well and was taken to the surgical intensive care unit in stable condition.  Postoperative hospital course: Patient is overall progressed somewhat slowly but well.  Initially did require some inotropic support with milrinone but this was able to be weaned over time.  He required some pacing but subsequently his permanent pacer was reprogrammed and his rhythm has remained stable.  He does have an expected acute blood loss anemia which is been monitored.  Additionally he had a thrombocytopenia which is improved.  Most recent platelet count is 130,000.  Most recent hemoglobin and hematocrit are 9.8 and 31.3.  There is a macrocytic component of his anemia with an MCV of 101.  All routine lines, monitors and drainage devices have been discontinued in the standard fashion.  He did develop an episode of increased left arm swelling and edema during the postoperative period which was initially quite painful.  This is slowly improved.  Additionally there was some thought that there could be a cellulitis component from an IV site and he was started on intravenous Ancef.  This is showing good resolution.  He is tolerating gradually increasing activity using standard protocols.  Incisions are noted to be healing well without evidence of infection.  Oxygen has been weaned and he maintains good saturations on room air.  He has been resumed on Coumadin for his previous DVT has noted in the history and this will be continued as an outpatient.  Daily PT and INR's were obtained.  He has been given 3 doses of Coumadin 7.5 mg and his INR as of 10/15 is up to 1.98. Per Dr. Prescott Gum, he is to resume Coumadin 5 mg daily (or as instructed). He has an appointment to have his PT and INR checked [facility] on Wednesday 10/16. Swelling and tenderness in his left have is slowly  improving. Cefazolin will be stopped and he will be transitioned to oral Keflex at discharge. All wounds are clean and dry and there are no signs of infection. He is still volume overloaded. His creatinine on 10/15 was 1.35 (stable). He will need further dieresis as an outpatient. As discussed with Dr. Prescott Gum, patient is felt surgically stable for discharge home today.    Consults: None  Significant Diagnostic Studies: Routine postoperative serial labs and chest x-rays, additionally left upper arm vascular ultrasound.  Treatments: surgery:  OPERATIVE REPORT  DATE OF PROCEDURE:  08/26/2018  OPERATION: 1.  Coronary artery bypass grafting x3 (left internal mammary artery to left anterior descending, saphenous vein graft to diagonal, saphenous vein graft to circumflex marginal). 2.  Endoscopic harvest of right leg greater saphenous vein. 3.  Placement of permanent epicardial left ventricular pacing lead for possible later biventricular pacing.  SURGEON:  Len Childs, MD  ASSISTANT:  Jadene Pierini, PA-C  ANESTHESIA:  General by Renold Don, MD  PREOPERATIVE DIAGNOSES:  Severe 3-vessel coronary artery disease, ischemic cardiomyopathy, ejection fraction 40%.  POSTOPERATIVE DIAGNOSES:  Severe 3-vessel coronary artery disease, ischemic cardiomyopathy, ejection fraction 40%.  Discharge Exam: Blood pressure 127/81, pulse 80, temperature 97.9 F (36.6 C), temperature source Oral, resp. rate 20, weight 98.3 kg, SpO2 95 %.  Cardiovascular: V paced Pulmonary: Diminished at bases Abdomen: Soft, non tender, bowel sounds present. Extremities: Mild bilateral lower extremity edema. Left arm and hand swelling.Motor/sensory intact Wounds: Clean and dry.  No erythema or signs of infection.  Disposition: Discharge disposition: 01-Home or Self Care       Discharge Instructions    Amb Referral to Cardiac Rehabilitation   Complete by:  As directed    Diagnosis:  CABG   CABG X  ___:  3     Allergies as of 09/03/2018      Reactions   Azithromycin Hives   Erythromycin Hives      Medication List    STOP taking these medications   losartan 25 MG tablet Commonly known as:  COZAAR     TAKE these medications   albuterol 108 (90 Base) MCG/ACT inhaler Commonly known as:  PROVENTIL HFA;VENTOLIN HFA Inhale 1-2 puffs into the lungs every 4 (four) hours as needed for wheezing or shortness of breath.   allopurinol 100 MG tablet Commonly known as:  ZYLOPRIM Take 100 mg by mouth 2 (two) times daily.   amLODipine 10 MG tablet Commonly known as:  NORVASC Take 1 tablet (10 mg total) by mouth daily.   aspirin EC 81 MG tablet Take 81 mg by mouth daily.   atorvastatin 40 MG tablet Commonly known as:  LIPITOR Take 1 tablet (40 mg total) by mouth daily.   cephALEXin 500 MG capsule Commonly known as:  KEFLEX Take 1 capsule (500 mg total) by mouth 3 (three) times daily.   Fluticasone-Salmeterol 100-50 MCG/DOSE Aepb Commonly known as:  ADVAIR Inhale 1 puff into the lungs daily as needed (shortness of breath). What changed:  how much to take   furosemide 40 MG tablet Commonly known as:  LASIX Take 1 tablet (40 mg  total) by mouth daily. For 10 days then stop.   metoprolol succinate 25 MG 24 hr tablet Commonly known as:  TOPROL-XL Take 1 tablet (25 mg total) by mouth daily.   multivitamin tablet Take 1 tablet by mouth daily.   NON FORMULARY Take 1 tablet by mouth daily. otc prostate multivitamin   potassium chloride 10 MEQ tablet Commonly known as:  K-DUR,KLOR-CON Take 1 tablet (10 mEq total) by mouth daily. For 10 days then stop.   traMADol 50 MG tablet Commonly known as:  ULTRAM Take 50 mg by mouth every 4-6 hours PRN severe pain   warfarin 5 MG tablet Commonly known as:  COUMADIN Take 5 mg by mouth at bedtime.            Durable Medical Equipment  (From admission, onward)         Start     Ordered   09/02/18 1527  For home use only DME  Walker rolling  Once    Question:  Patient needs a walker to treat with the following condition  Answer:  Weakness   09/02/18 1526        The patient has been discharged on:   1.Beta Blocker:  Yes [ y  ]                              No   [   ]                              If No, reason:  2.Ace Inhibitor/ARB: Yes [   ]                                     No  [ n   ]                                     If No, reason: renal insuff  3.Statin:   Yes Blue.Reese   ]                  No  [   ]                  If No, reason:  4.Ecasa:  Yes  [ y  ]                  No   [   ]                  If No, reason:    Follow-up Information    Ivin Poot, MD Follow up.   Specialty:  Cardiothoracic Surgery Why:  Appointment to see the surgeon on 10/02/2018 at 10:30 AM.  Please obtain a chest x-ray at [facility] a is located in the same office complex on the first floor. Contact information: [place] [address] [phone]        Rise Mu, PA-C. Go to.   Specialties:  Physician Assistant, Cardiology, Radiology Why:  Appointment time is at 10:00 am Contact information: [address] [place] [address] [place]  [place]. Go on 09/04/2018.   Specialty:  Cardiology Why:  Appointment time is at 10:30 am. Appointment is to have PT and INR drawn as is on Coumadin.  Contact information: [address] [place] [place] [phone]          Signed: Arnoldo Lenis 09/03/2018, 8:06 AM

## 2018-08-30 NOTE — Discharge Instructions (Addendum)
Endoscopic Saphenous Vein Harvesting, Care After °Refer to this sheet in the next few weeks. These instructions provide you with information about caring for yourself after your procedure. Your health care provider may also give you more specific instructions. Your treatment has been planned according to current medical practices, but problems sometimes occur. Call your health care provider if you have any problems or questions after your procedure. °What can I expect after the procedure? °After the procedure, it is common to have: °· Pain. °· Bruising. °· Swelling. °· Numbness. ° °Follow these instructions at home: °Medicine °· Take over-the-counter and prescription medicines only as told by your health care provider. °· Do not drive or operate heavy machinery while taking prescription pain medicine. °Incision care ° °· Follow instructions from your health care provider about how to take care of the cut made during surgery (incision). Make sure you: °? Wash your hands with soap and water before you change your bandage (dressing). If soap and water are not available, use hand sanitizer. °? Change your dressing as told by your health care provider. °? Leave stitches (sutures), skin glue, or adhesive strips in place. These skin closures may need to be in place for 2 weeks or longer. If adhesive strip edges start to loosen and curl up, you may trim the loose edges. Do not remove adhesive strips completely unless your health care provider tells you to do that. °· Check your incision area every day for signs of infection. Check for: °? More redness, swelling, or pain. °? More fluid or blood. °? Warmth. °? Pus or a bad smell. °General instructions °· Raise (elevate) your legs above the level of your heart while you are sitting or lying down. °· Do any exercises your health care providers have given you. These may include deep breathing, coughing, and walking exercises. °· Do not shower, take baths, swim, or use a hot tub  unless told by your health care provider. °· Wear your elastic stocking if told by your health care provider. °· Keep all follow-up visits as told by your health care provider. This is important. °Contact a health care provider if: °· Medicine does not help your pain. °· Your pain gets worse. °· You have new leg bruises or your leg bruises get bigger. °· You have a fever. °· Your leg feels numb. °· You have more redness, swelling, or pain around your incision. °· You have more fluid or blood coming from your incision. °· Your incision feels warm to the touch. °· You have pus or a bad smell coming from your incision. °Get help right away if: °· Your pain is severe. °· You develop pain, tenderness, warmth, redness, or swelling in any part of your leg. °· You have chest pain. °· You have trouble breathing. °This information is not intended to replace advice given to you by your health care provider. Make sure you discuss any questions you have with your health care provider. °Document Released: 07/19/2011 Document Revised: 04/13/2016 Document Reviewed: 09/20/2015 °Elsevier Interactive Patient Education © 2018 Else[employer] Inc[employer] °Coronary Artery Bypass Grafting, Care After °These instructions give you information on caring for yourself after your procedure. Your doctor may also give you more specific instructions. Call your doctor if you have any problems or questions after your procedure. °Follow these instructions at home: °· Only take medicine as told by your doctor. Take medicines exactly as told. Do not stop taking medicines or start any new medicines without talking to your doctor first. °·   Take your pulse as told by your doctor.  Do deep breathing as told by your doctor. Use your breathing device (incentive spirometer), if given, to practice deep breathing several times a day. Support your chest with a pillow or your arms when you take deep breaths or cough.  Keep the area clean, dry, and protected where the  surgery cuts (incisions) were made. Remove bandages (dressings) only as told by your doctor. If strips were applied to surgical area, do not take them off. They fall off on their own.  Check the surgery area daily for puffiness (swelling), redness, or leaking fluid.  If surgery cuts were made in your legs: ? Avoid crossing your legs. ? Avoid sitting for long periods of time. Change positions every 30 minutes. ? Raise your legs when you are sitting. Place them on pillows.  Wear stockings that help keep blood clots from forming in your legs (compression stockings).  Only take sponge baths until your doctor says it is okay to take showers. Pat the surgery area dry. Do not rub the surgery area with a washcloth or towel. Do not bathe, swim, or use a hot tub until your doctor says it is okay.  Eat foods that are high in fiber. These include raw fruits and vegetables, whole grains, beans, and nuts. Choose lean meats. Avoid canned, processed, and fried foods.  Drink enough fluids to keep your pee (urine) clear or pale yellow.  Weigh yourself every day.  Rest and limit activity as told by your doctor. You may be told to: ? Stop any activity if you have chest pain, shortness of breath, changes in heartbeat, or dizziness. Get help right away if this happens. ? Move around often for short amounts of time or take short walks as told by your doctor. Gradually become more active. You may need help to strengthen your muscles and build endurance. ? Avoid lifting, pushing, or pulling anything heavier than 10 pounds (4.5 kg) for at least 6 weeks after surgery.  Do not drive until your doctor says it is okay.  Ask your doctor when you can go back to work.  Ask your doctor when you can begin sexual activity again.  Follow up with your doctor as told. Contact a doctor if:  You have puffiness, redness, more pain, or fluid draining from the incision site.  You have a fever.  You have puffiness in your  ankles or legs.  You have pain in your legs.  You gain 2 or more pounds (0.9 kg) a day.  You feel sick to your stomach (nauseous) or throw up (vomit).  You have watery poop (diarrhea). Get help right away if:  You have chest pain that goes to your jaw or arms.  You have shortness of breath.  You have a fast or irregular heartbeat.  You notice a "clicking" in your breastbone when you move.  You have numbness or weakness in your arms or legs.  You feel dizzy or light-headed. This information is not intended to replace advice given to you by your health care provider. Make sure you discuss any questions you have with your health care provider. Document Released: 11/11/2013 Document Revised: 04/13/2016 Document Reviewed: 04/15/2013 Elsevier Interactive Patient Education  2017 [place].  ========================================================================  Information on my medicine - Coumadin   (Warfarin)  Why was Coumadin prescribed for you? Coumadin was prescribed for you because you have a blood clot or a medical condition that can cause an increased risk of  forming blood clots. Blood clots can cause serious health problems by blocking the flow of blood to the heart, lung, or brain. Coumadin can prevent harmful blood clots from forming. As a reminder your indication for Coumadin is:   Deep Vein Thrombosis Treatment  What test will check on my response to Coumadin? While on Coumadin (warfarin) you will need to have an INR test regularly to ensure that your dose is keeping you in the desired range. The INR (international normalized ratio) number is calculated from the result of the laboratory test called prothrombin time (PT).  If an INR APPOINTMENT HAS NOT ALREADY BEEN MADE FOR YOU please schedule an appointment to have this lab work done by your health care provider within 7 days. Your INR goal is usually a number between:  2 to 3 or your provider may give you a more  narrow range like 2-2.5.  Ask your health care provider during an office visit what your goal INR is.  What  do you need to  know  About  COUMADIN? Take Coumadin (warfarin) exactly as prescribed by your healthcare provider about the same time each day.  DO NOT stop taking without talking to the doctor who prescribed the medication.  Stopping without other blood clot prevention medication to take the place of Coumadin may increase your risk of developing a new clot or stroke.  Get refills before you run out.  What do you do if you miss a dose? If you miss a dose, take it as soon as you remember on the same day then continue your regularly scheduled regimen the next day.  Do not take two doses of Coumadin at the same time.  Important Safety Information A possible side effect of Coumadin (Warfarin) is an increased risk of bleeding. You should call your healthcare provider right away if you experience any of the following: ? Bleeding from an injury or your nose that does not stop. ? Unusual colored urine (red or dark brown) or unusual colored stools (red or black). ? Unusual bruising for unknown reasons. ? A serious fall or if you hit your head (even if there is no bleeding).  Some foods or medicines interact with Coumadin (warfarin) and might alter your response to warfarin. To help avoid this: ? Eat a balanced diet, maintaining a consistent amount of Vitamin K. ? Notify your provider about major diet changes you plan to make. ? Avoid alcohol or limit your intake to 1 drink for women and 2 drinks for men per day. (1 drink is 5 oz. wine, 12 oz. beer, or 1.5 oz. liquor.)  Make sure that ANY health care provider who prescribes medication for you knows that you are taking Coumadin (warfarin).  Also make sure the healthcare provider who is monitoring your Coumadin knows when you have started a new medication including herbals and non-prescription products.  Coumadin (Warfarin)  Major Drug  Interactions  Increased Warfarin Effect Decreased Warfarin Effect  Alcohol (large quantities) Antibiotics (esp. Septra/Bactrim, Flagyl, Cipro) Amiodarone (Cordarone) Aspirin (ASA) Cimetidine (Tagamet) Megestrol (Megace) NSAIDs (ibuprofen, naproxen, etc.) Piroxicam (Feldene) Propafenone (Rythmol SR) Propranolol (Inderal) Isoniazid (INH) Posaconazole (Noxafil) Barbiturates (Phenobarbital) Carbamazepine (Tegretol) Chlordiazepoxide (Librium) Cholestyramine (Questran) Griseofulvin Oral Contraceptives Rifampin Sucralfate (Carafate) Vitamin K   Coumadin (Warfarin) Major Herbal Interactions  Increased Warfarin Effect Decreased Warfarin Effect  Garlic Ginseng Ginkgo biloba Coenzyme Q10 Green tea St. Johns wort    Coumadin (Warfarin) FOOD Interactions  Eat a consistent number of servings per week of foods HIGH  in Vitamin K (1 serving =  cup)  Collards (cooked, or boiled & drained) Kale (cooked, or boiled & drained) Mustard greens (cooked, or boiled & drained) Parsley *serving size only =  cup Spinach (cooked, or boiled & drained) Swiss chard (cooked, or boiled & drained) Turnip greens (cooked, or boiled & drained)  Eat a consistent number of servings per week of foods MEDIUM-HIGH in Vitamin K (1 serving = 1 cup)  Asparagus (cooked, or boiled & drained) Broccoli (cooked, boiled & drained, or raw & chopped) Brussel sprouts (cooked, or boiled & drained) *serving size only =  cup Lettuce, raw (green leaf, endive, romaine) Spinach, raw Turnip greens, raw & chopped   These websites have more information on Coumadin (warfarin):  FailFactory.se; VeganReport.com.au;

## 2018-08-30 NOTE — Progress Notes (Signed)
CARDIAC REHAB PHASE I   PRE:  Rate/Rhythm: 93 pacing    BP: sitting 123/79    SaO2: 93 RA  MODE:  Ambulation: 470 ft   POST:  Rate/Rhythm: 105 pacing    BP: sitting 136/82     SaO2: 98 RA  Pt able to stand and walk with RW. Fairly steady with slow pace. Gave him reminders to stand tall. Left hand quite swollen, he propped it on RW. Tired toward end of walk. To recliner. Practiced IS (1000 mL). Encouraged more walking and IS this weekend. Will f/u tomorrow. [place], ACSM 08/30/2018 2:19 PM

## 2018-08-30 NOTE — Care Management Important Message (Signed)
Important Message  Patient Details  Name: Sean [name] MRN: [id number] Date of Birth: 12/01/1940   Medicare Important Message Given:  Yes    [name] 08/30/2018, 3:09 PM

## 2018-08-31 LAB — COMPREHENSIVE METABOLIC PANEL
ALT: 18 U/L (ref 0–44)
AST: 40 U/L (ref 15–41)
Albumin: 3.1 g/dL — ABNORMAL LOW (ref 3.5–5.0)
Alkaline Phosphatase: 49 U/L (ref 38–126)
Anion gap: 10 (ref 5–15)
BUN: 28 mg/dL — ABNORMAL HIGH (ref 8–23)
CO2: 27 mmol/L (ref 22–32)
Calcium: 8.7 mg/dL — ABNORMAL LOW (ref 8.9–10.3)
Chloride: 101 mmol/L (ref 98–111)
Creatinine, Ser: 1.34 mg/dL — ABNORMAL HIGH (ref 0.61–1.24)
GFR calc Af Amer: 58 mL/min — ABNORMAL LOW (ref >60)
GFR calc non Af Amer: 50 mL/min — ABNORMAL LOW (ref >60)
Glucose, Bld: 97 mg/dL (ref 70–99)
Potassium: 4.3 mmol/L (ref 3.5–5.1)
Sodium: 138 mmol/L (ref 135–145)
Total Bilirubin: 1.2 mg/dL (ref 0.3–1.2)
Total Protein: 5.8 g/dL — ABNORMAL LOW (ref 6.5–8.1)

## 2018-08-31 LAB — GLUCOSE, CAPILLARY
Glucose-Capillary: 108 mg/dL — ABNORMAL HIGH (ref 70–99)
Glucose-Capillary: 139 mg/dL — ABNORMAL HIGH (ref 70–99)
Glucose-Capillary: 100 mg/dL — ABNORMAL HIGH (ref 70–99)
Glucose-Capillary: 119 mg/dL — ABNORMAL HIGH (ref 70–99)

## 2018-08-31 LAB — PROTIME-INR
INR: 1.22
Prothrombin Time: 15.3 seconds — ABNORMAL HIGH (ref 11.4–15.2)

## 2018-08-31 MED ORDER — ASPIRIN EC 81 MG PO TBEC
81.0000 mg | DELAYED_RELEASE_TABLET | Freq: Every day | ORAL | Status: DC
  Administered 2018-09-01 – 2018-09-03 (×3): 81 mg via ORAL
  Filled 2018-08-31 (×3): qty 1

## 2018-08-31 MED ORDER — ASPIRIN 81 MG PO CHEW
81.0000 mg | CHEWABLE_TABLET | Freq: Every day | ORAL | Status: DC
  Filled 2018-08-31 (×2): qty 1

## 2018-08-31 MED ORDER — WARFARIN SODIUM 7.5 MG PO TABS
7.5000 mg | ORAL_TABLET | Freq: Every day | ORAL | Status: DC
  Administered 2018-08-31 – 2018-09-02 (×3): 7.5 mg via ORAL
  Filled 2018-08-31 (×3): qty 1

## 2018-08-31 MED ORDER — AMLODIPINE BESYLATE 10 MG PO TABS
10.0000 mg | ORAL_TABLET | Freq: Every day | ORAL | Status: DC
  Administered 2018-09-01 – 2018-09-03 (×3): 10 mg via ORAL
  Filled 2018-08-31 (×3): qty 1

## 2018-08-31 MED ORDER — FUROSEMIDE 40 MG PO TABS
40.0000 mg | ORAL_TABLET | Freq: Two times a day (BID) | ORAL | Status: DC
  Administered 2018-08-31 – 2018-09-01 (×3): 40 mg via ORAL
  Filled 2018-08-31 (×3): qty 1

## 2018-08-31 NOTE — Progress Notes (Addendum)
[address]       [place] [address]             [phone]      5 Days Post-Op Procedure(s) (LRB): CORONARY ARTERY BYPASS GRAFTING (CABG) x3 , Using left internal mammory artery and right leg greater saphronious vein. Harvested endoscopically. (N/A) TRANSESOPHAGEAL ECHOCARDIOGRAM (TEE) (N/A) LV PACING LEAD PLACEMENT (N/A) Subjective: Left arm feels much better, able to move hand a little more , significant swelling persists  Objective: Vital signs in last 24 hours: Temp:  [97.8 F (36.6 C)-98.5 F (36.9 C)] 98.5 F (36.9 C) (10/12 0454) Pulse Rate:  [80-89] 89 (10/11 2045) Cardiac Rhythm: Ventricular paced (10/12 0400) Resp:  [16-22] 18 (10/11 2045) BP: (121-178)/(61-79) 138/72 (10/12 0454) SpO2:  [92 %] 92 % (10/11 2045) Weight:  [101.2 kg] 101.2 kg (10/12 0527)  Hemodynamic parameters for last 24 hours:    Intake/Output from previous day: 10/11 0701 - 10/12 0700 In: 390 [P.O.:240; IV Piggyback:150] Out: -  Intake/Output this shift: No intake/output data recorded.  General appearance: alert, cooperative and no distress Heart: regular rate and rhythm Lungs: dim in bases Abdomen: soft, nontender Extremities: + pitting edema Wound: incis healing well  Lab Results: Recent Labs    08/29/18 0500 08/30/18 0411  WBC 14.4* 13.3*  HGB 9.0* 9.8*  HCT 28.9* 31.3*  PLT 93* 130*   BMET:  Recent Labs    08/30/18 0411 08/31/18 0505  NA 139 138  K 4.1 4.3  CL 101 101  CO2 27 27  GLUCOSE 102* 97  BUN 32* 28*  CREATININE 1.43* 1.34*  CALCIUM 8.7* 8.7*    PT/INR:  Recent Labs    08/31/18 0505  LABPROT 15.3*  INR 1.22   ABG    Component Value Date/Time   PHART 7.341 (L) 08/26/2018 2002   HCO3 23.3 08/26/2018 2002   TCO2 26 08/27/2018 1641   ACIDBASEDEF 3.0 (H) 08/26/2018 2002   O2SAT 81.5 08/29/2018 0527   CBG (last 3)  Recent Labs    08/30/18 1628 08/30/18 2217 08/31/18 0632  GLUCAP 79 135* 108*    Meds Scheduled  Meds: . acetaminophen  1,000 mg Oral Q6H   Or  . acetaminophen (TYLENOL) oral liquid 160 mg/5 mL  1,000 mg Per Tube Q6H  . amLODipine  5 mg Oral Daily  . aspirin EC  325 mg Oral Daily   Or  . aspirin  324 mg Per Tube Daily  . atorvastatin  40 mg Oral Daily  . bisacodyl  10 mg Oral Daily   Or  . bisacodyl  10 mg Rectal Daily  . colchicine  0.6 mg Oral Daily  . docusate sodium  200 mg Oral Daily  . enoxaparin (LOVENOX) injection  40 mg Subcutaneous Daily  . furosemide  40 mg Oral Daily  . Influenza vac split quadrivalent PF  0.5 mL Intramuscular Tomorrow-1000  . insulin aspart  0-24 Units Subcutaneous TID AC & HS  . insulin aspart  4 Units Subcutaneous TID WC  . metoprolol tartrate  12.5 mg Oral BID   Or  . metoprolol tartrate  12.5 mg Per Tube BID  . pantoprazole  40 mg Oral Daily  . pneumococcal 23 valent vaccine  0.5 mL Intramuscular Tomorrow-1000  . potassium chloride  20 mEq Oral BID  . sodium chloride flush  10-40 mL Intracatheter Q12H  . warfarin  6 mg Oral q1800  . Warfarin - Physician Dosing Inpatient  Does not apply q1800   Continuous Infusions: . sodium chloride Stopped (08/27/18 1059)  . sodium chloride    . sodium chloride Stopped (08/27/18 0900)  .  ceFAZolin (ANCEF) IV Stopped (08/31/18 8377)   PRN Meds:.sodium chloride, metoprolol tartrate, ondansetron (ZOFRAN) IV, oxyCODONE, sodium chloride flush, traMADol  Xrays Dg Chest 2 View  Result Date: 08/30/2018 CLINICAL DATA:  History of coronary bypass grafting EXAM: CHEST - 2 VIEW COMPARISON:  08/29/2018 FINDINGS: Cardiac shadow remains enlarged. Postsurgical changes are again seen. Pacing device is again noted and stable. Stable left pleural effusion and likely underlying atelectasis is seen. Some mild right perihilar atelectatic changes are noted as well. IMPRESSION: Stable changes in the left base. Slight increase in right perihilar atelectasis. Electronically Signed   By: Inez Catalina M.D.   On: 08/30/2018 10:11     Assessment/Plan: S/P Procedure(s) (LRB): CORONARY ARTERY BYPASS GRAFTING (CABG) x3 , Using left internal mammory artery and right leg greater saphronious vein. Harvested endoscopically. (N/A) TRANSESOPHAGEAL ECHOCARDIOGRAM (TEE) (N/A) LV PACING LEAD PLACEMENT (N/A)  1 left arm better, cont coumadin and lovenox, also on ancef for poss cellulitis component- keep elevated as able 2 LE edema is worse, creat improved, will change to BID lasix for now UOPnot accurately recorded 3 PPM - rhythm stable, BP control pretty variable- 939'S to 886'Y systolic, will increase norvasc to 10 4 increase coumadin dose, INR 1.22 today 5 sugar control is adequate   LOS: 5 days    [name] PA-C 08/31/2018 Pager [phone]  On coumadin Left arm swollen , but improved from yesterday  On coumadin, decrease asa to 81 mg daily  I have seen and examined Dondra Spry and agree with the above assessment  and plan.  Grace Isaac MD Beeper [phone] Office [phone] 08/31/2018 10:32 AM

## 2018-08-31 NOTE — Progress Notes (Signed)
CARDIAC REHAB PHASE I   PRE:  Rate/Rhythm: 88 paced  BP:  Supine: 176/93 Sitting:   Standing:    SaO2: 98% RA  MODE:  Ambulation: 470 ft   POST:  Rate/Rhythm: 103 ST paced  BP:  Supine:   Sitting: 173/97  Standing:    SaO2: 97% RA   [id number] Patient tolerated ambulation well with assist x1 and pushing rolling walker. Gait steady, no c/o with ambulation. To bed after walk, call bell within reach. Blood pressure elevated before and after walk. Sol Passer, MS, ACSM CEP

## 2018-09-01 ENCOUNTER — Inpatient Hospital Stay (HOSPITAL_COMMUNITY): Payer: PPO

## 2018-09-01 LAB — COMPREHENSIVE METABOLIC PANEL
ALT: 18 U/L (ref 0–44)
AST: 49 U/L — ABNORMAL HIGH (ref 15–41)
Albumin: 3.2 g/dL — ABNORMAL LOW (ref 3.5–5.0)
Alkaline Phosphatase: 59 U/L (ref 38–126)
Anion gap: 9 (ref 5–15)
BUN: 26 mg/dL — ABNORMAL HIGH (ref 8–23)
CO2: 29 mmol/L (ref 22–32)
Calcium: 8.7 mg/dL — ABNORMAL LOW (ref 8.9–10.3)
Chloride: 99 mmol/L (ref 98–111)
Creatinine, Ser: 1.28 mg/dL — ABNORMAL HIGH (ref 0.61–1.24)
GFR calc Af Amer: >60 mL/min (ref >60)
GFR calc non Af Amer: 53 mL/min — ABNORMAL LOW (ref >60)
Glucose, Bld: 133 mg/dL — ABNORMAL HIGH (ref 70–99)
Potassium: 3.9 mmol/L (ref 3.5–5.1)
Sodium: 137 mmol/L (ref 135–145)
Total Bilirubin: 1 mg/dL (ref 0.3–1.2)
Total Protein: 5.6 g/dL — ABNORMAL LOW (ref 6.5–8.1)

## 2018-09-01 LAB — GLUCOSE, CAPILLARY
Glucose-Capillary: 97 mg/dL (ref 70–99)
Glucose-Capillary: 119 mg/dL — ABNORMAL HIGH (ref 70–99)
Glucose-Capillary: 135 mg/dL — ABNORMAL HIGH (ref 70–99)
Glucose-Capillary: 136 mg/dL — ABNORMAL HIGH (ref 70–99)

## 2018-09-01 LAB — PROTIME-INR
INR: 1.46
Prothrombin Time: 17.6 seconds — ABNORMAL HIGH (ref 11.4–15.2)

## 2018-09-01 NOTE — Progress Notes (Addendum)
[address]       [place] [address]             [phone]      6 Days Post-Op Procedure(s) (LRB): CORONARY ARTERY BYPASS GRAFTING (CABG) x3 , Using left internal mammory artery and right leg greater saphronious vein. Harvested endoscopically. (N/A) TRANSESOPHAGEAL ECHOCARDIOGRAM (TEE) (N/A) LV PACING LEAD PLACEMENT (N/A) Subjective: conts to feel better, left arm swelling slow to improve, LE edema remains significant  Objective: Vital signs in last 24 hours: Temp:  [98 F (36.7 C)-98.4 F (36.9 C)] 98.4 F (36.9 C) (10/13 0335) Pulse Rate:  [80-92] 83 (10/13 0335) Cardiac Rhythm: Ventricular paced (10/12 1900) Resp:  [20-25] 20 (10/13 0335) BP: (128-176)/(55-93) 138/86 (10/13 0335) SpO2:  [93 %-98 %] 93 % (10/13 0335) Weight:  [101.2 kg] 101.2 kg (10/13 0335)  Hemodynamic parameters for last 24 hours:    Intake/Output from previous day: 10/12 0701 - 10/13 0700 In: 880.9 [P.O.:840; I.V.:10; IV Piggyback:30.9] Out: 400 [Urine:400] Intake/Output this shift: No intake/output data recorded.  General appearance: alert, cooperative and no distress Heart: regular rate and rhythm Lungs: mildly dim in bases Abdomen: benign Extremities: + pitting edema left hand and Bil LE's  Wound: incis healing well  Lab Results: Recent Labs    08/30/18 0411  WBC 13.3*  HGB 9.8*  HCT 31.3*  PLT 130*   BMET:  Recent Labs    08/30/18 0411 08/31/18 0505  NA 139 138  K 4.1 4.3  CL 101 101  CO2 27 27  GLUCOSE 102* 97  BUN 32* 28*  CREATININE 1.43* 1.34*  CALCIUM 8.7* 8.7*    PT/INR:  Recent Labs    08/31/18 0505  LABPROT 15.3*  INR 1.22   ABG    Component Value Date/Time   PHART 7.341 (L) 08/26/2018 2002   HCO3 23.3 08/26/2018 2002   TCO2 26 08/27/2018 1641   ACIDBASEDEF 3.0 (H) 08/26/2018 2002   O2SAT 81.5 08/29/2018 0527   CBG (last 3)  Recent Labs    08/31/18 1641 08/31/18 2204 09/01/18 0605  GLUCAP 100* 119* 97     Meds Scheduled Meds: . amLODipine  10 mg Oral Daily  . aspirin EC  81 mg Oral Daily   Or  . aspirin  81 mg Per Tube Daily  . atorvastatin  40 mg Oral Daily  . bisacodyl  10 mg Oral Daily   Or  . bisacodyl  10 mg Rectal Daily  . colchicine  0.6 mg Oral Daily  . docusate sodium  200 mg Oral Daily  . enoxaparin (LOVENOX) injection  40 mg Subcutaneous Daily  . furosemide  40 mg Oral BID  . Influenza vac split quadrivalent PF  0.5 mL Intramuscular Tomorrow-1000  . insulin aspart  0-24 Units Subcutaneous TID AC & HS  . metoprolol tartrate  12.5 mg Oral BID   Or  . metoprolol tartrate  12.5 mg Per Tube BID  . pantoprazole  40 mg Oral Daily  . pneumococcal 23 valent vaccine  0.5 mL Intramuscular Tomorrow-1000  . potassium chloride  20 mEq Oral BID  . sodium chloride flush  10-40 mL Intracatheter Q12H  . warfarin  7.5 mg Oral q1800  . Warfarin - Physician Dosing Inpatient   Does not apply q1800   Continuous Infusions: . sodium chloride Stopped (08/27/18 1059)  . sodium chloride    . sodium chloride Stopped (08/27/18 0900)  .  ceFAZolin (ANCEF) IV 1 g (09/01/18  Sean [name].[place])   PRN Meds:.sodium chloride, metoprolol tartrate, ondansetron (ZOFRAN) IV, oxyCODONE, sodium chloride flush, traMADol  Xrays Dg Chest 2 View  Result Date: 09/01/2018 CLINICAL DATA:  History of CABG EXAM: CHEST - 2 VIEW COMPARISON:  08/30/2018 FINDINGS: Improved aeration left base with decreased atelectasis and effusion. Small left effusion remains. Mild right lower lobe atelectasis unchanged. Negative for edema or pneumonia. Pacemaker unchanged. IMPRESSION: Improvement in left lower lobe atelectasis and effusion. Mild right lower lobe atelectasis unchanged. Electronically Signed   By: Franchot Gallo M.D.   On: 09/01/2018 06:56    Assessment/Plan: S/P Procedure(s) (LRB): CORONARY ARTERY BYPASS GRAFTING (CABG) x3 , Using left internal mammory artery and right leg greater saphronious vein. Harvested endoscopically.  (N/A) TRANSESOPHAGEAL ECHOCARDIOGRAM (TEE) (N/A) LV PACING LEAD PLACEMENT (N/A)  1 stable overall , left arm conts to improve 2 Bp remains a little high, on norvasc and metoprolol, labs pending , if creat improved may be able to resume home cozaar 3 d/c wires today, PPM working well 4 sugars ok 5 INR pending 6 cont ancef for now, transition to po soon for possible left hand cellulitis 7 cont BID lasix, may need additional agent , UOP not accurately recorded but says he is urinating a lot, may be some third spacing d/t lower protein/albumin  LOS: 6 days    Sean Giovanni PA-C 09/01/2018 Pager [phone]  I have seen and examined Sean [name] and agree with the above assessment  and plan.  Sean Isaac MD Beeper [phone] Office [phone] 09/01/2018 1:43 PM

## 2018-09-01 NOTE — Progress Notes (Signed)
Pacing wires removed and intact. Pt tolerated well. Pt educated and agrees to bedrest 1 hr. Vital stable before and after. Will monitor.  Sean [name]

## 2018-09-02 LAB — COMPREHENSIVE METABOLIC PANEL
ALT: 18 U/L (ref 0–44)
AST: 52 U/L — ABNORMAL HIGH (ref 15–41)
Albumin: 3.2 g/dL — ABNORMAL LOW (ref 3.5–5.0)
Alkaline Phosphatase: 57 U/L (ref 38–126)
Anion gap: 11 (ref 5–15)
BUN: 26 mg/dL — ABNORMAL HIGH (ref 8–23)
CO2: 28 mmol/L (ref 22–32)
Calcium: 8.7 mg/dL — ABNORMAL LOW (ref 8.9–10.3)
Chloride: 99 mmol/L (ref 98–111)
Creatinine, Ser: 1.37 mg/dL — ABNORMAL HIGH (ref 0.61–1.24)
GFR calc Af Amer: 56 mL/min — ABNORMAL LOW (ref >60)
GFR calc non Af Amer: 49 mL/min — ABNORMAL LOW (ref >60)
Glucose, Bld: 96 mg/dL (ref 70–99)
Potassium: 4.5 mmol/L (ref 3.5–5.1)
Sodium: 138 mmol/L (ref 135–145)
Total Bilirubin: 1.1 mg/dL (ref 0.3–1.2)
Total Protein: 5.6 g/dL — ABNORMAL LOW (ref 6.5–8.1)

## 2018-09-02 LAB — PROTIME-INR
INR: 1.52
Prothrombin Time: 18.2 seconds — ABNORMAL HIGH (ref 11.4–15.2)

## 2018-09-02 LAB — GLUCOSE, CAPILLARY
Glucose-Capillary: 139 mg/dL — ABNORMAL HIGH (ref 70–99)
Glucose-Capillary: 114 mg/dL — ABNORMAL HIGH (ref 70–99)
Glucose-Capillary: 101 mg/dL — ABNORMAL HIGH (ref 70–99)
Glucose-Capillary: 110 mg/dL — ABNORMAL HIGH (ref 70–99)
Glucose-Capillary: 162 mg/dL — ABNORMAL HIGH (ref 70–99)

## 2018-09-02 MED ORDER — FUROSEMIDE 40 MG PO TABS
40.0000 mg | ORAL_TABLET | Freq: Every day | ORAL | Status: DC
  Administered 2018-09-03: 40 mg via ORAL
  Filled 2018-09-02: qty 1

## 2018-09-02 MED ORDER — POTASSIUM CHLORIDE CRYS ER 20 MEQ PO TBCR
20.0000 meq | EXTENDED_RELEASE_TABLET | Freq: Every day | ORAL | Status: DC
  Administered 2018-09-03: 20 meq via ORAL
  Filled 2018-09-02: qty 1

## 2018-09-02 MED ORDER — FUROSEMIDE 10 MG/ML IJ SOLN
40.0000 mg | Freq: Once | INTRAMUSCULAR | Status: AC
  Administered 2018-09-02: 40 mg via INTRAVENOUS
  Filled 2018-09-02: qty 4

## 2018-09-02 NOTE — Progress Notes (Addendum)
      [address]       [place] [address]             [phone]        7 Days Post-Op Procedure(s) (LRB): CORONARY ARTERY BYPASS GRAFTING (CABG) x3 , Using left internal mammory artery and right leg greater saphronious vein. Harvested endoscopically. (N/A) TRANSESOPHAGEAL ECHOCARDIOGRAM (TEE) (N/A) LV PACING LEAD PLACEMENT (N/A)  Subjective: Patient without complaints this am. He wants to go home.  Objective: Vital signs in last 24 hours: Temp:  [98 F (36.7 C)-98.6 F (37 C)] 98 F (36.7 C) (10/14 0554) Pulse Rate:  [79-85] 84 (10/13 1958) Cardiac Rhythm: Ventricular paced (10/13 1900) Resp:  [18-19] 19 (10/14 0554) BP: (139-155)/(73-89) 139/78 (10/14 0554) SpO2:  [96 %-98 %] 98 % (10/14 0005) Weight:  [98.9 kg] 98.9 kg (10/14 0554)  Pre op weight 99.2 kg Current Weight  09/02/18 98.9 kg      Intake/Output from previous day: 10/13 0701 - 10/14 0700 In: 62 [P.O.:960; I.V.:10] Out: -    Physical Exam:  Cardiovascular: V paced Pulmonary: Diminished at bases Abdomen: Soft, non tender, bowel sounds present. Extremities: Mild bilateral lower extremity edema. Left arm and hand swelling Wounds: Clean and dry.  No erythema or signs of infection.  Lab Results: CBC:No results for input(s): WBC, HGB, HCT, PLT in the last 72 hours. BMET:  Recent Labs    09/01/18 1016 09/02/18 0258  NA 137 138  K 3.9 4.5  CL 99 99  CO2 29 28  GLUCOSE 133* 96  BUN 26* 26*  CREATININE 1.28* 1.37*  CALCIUM 8.7* 8.7*    PT/INR:  Lab Results  Component Value Date   INR 1.52 09/02/2018   INR 1.46 09/01/2018   INR 1.22 08/31/2018   ABG:  INR: Will add last result for INR, ABG once components are confirmed Will add last 4 CBG results once components are confirmed  Assessment/Plan:  1. CV - V paced. On Amlodipine 10 mg daily, Lopressor 12.5 mg bid, and Coumadin.  INR slightly increased 1.52 this am. 2.  Pulmonary - On room air. Encourage incentive  spirometer 3.  Acute blood loss anemia - H and H stable at 9.8 and 31.3 4. Creatinine slightly increased to 1.37. Will decrease Lasix to daily but will give IV this am 5. Mildly elevated AST  6. Thrombocytopenia-platelets 130,000 7. Volume overload- Lasix 40 mg bid 8. ID-on Cefazolin for possible left hand cellulitis 9. Will discuss disposition with Dr. 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 He will resume his preoperative Coumadin dosing for his left upper extremity DVT which occurred after the left radial artery catheterization last summer.  Discharge instructions reviewed with patient and wife including wound care, activity limits, diet, and medications and follow-up in our [facility] surgical office.  Dahlia Byes MD

## 2018-09-02 NOTE — Progress Notes (Addendum)
Pt still not using urinal, " I haven't used it the past several days so why I need to now", pt instructed the MD  Want to know intake and output from lasix etc

## 2018-09-02 NOTE — Progress Notes (Signed)
CARDIAC REHAB PHASE I   PRE:  Rate/Rhythm: 88 paced  BP:  Sitting: 153/94        SaO2: 100 RA  MODE:  Ambulation: 470 ft     pk hr 102   POST:  Rate/Rhythm: 94 paced  BP:  Sitting: 158/89        SaO2: 100 RA  Patient ambulated 470 ft with walker and stand by assist. Patient declines pain or sob. Discharge ed completed with patient and wife. Reviewed importance of daily showers and monitoring incision. Encourage continued IS use and sternal precautions. Patient given in the tube sheet, heart healthy and diabetic diets. Reviewed ex rx and guidelines. Wife concerned about steps at house and requested walker, CM aware. Patient referred to [facility] at [facility]. Patient and wife deny further questions or concerns.   [place], [place] 09/02/2018 11:25 AM

## 2018-09-03 LAB — COMPREHENSIVE METABOLIC PANEL
ALT: 17 U/L (ref 0–44)
AST: 43 U/L — ABNORMAL HIGH (ref 15–41)
Albumin: 3 g/dL — ABNORMAL LOW (ref 3.5–5.0)
Alkaline Phosphatase: 48 U/L (ref 38–126)
Anion gap: 12 (ref 5–15)
BUN: 24 mg/dL — ABNORMAL HIGH (ref 8–23)
CO2: 30 mmol/L (ref 22–32)
Calcium: 8.9 mg/dL (ref 8.9–10.3)
Chloride: 97 mmol/L — ABNORMAL LOW (ref 98–111)
Creatinine, Ser: 1.35 mg/dL — ABNORMAL HIGH (ref 0.61–1.24)
GFR calc Af Amer: 57 mL/min — ABNORMAL LOW (ref >60)
GFR calc non Af Amer: 49 mL/min — ABNORMAL LOW (ref >60)
Glucose, Bld: 89 mg/dL (ref 70–99)
Potassium: 4 mmol/L (ref 3.5–5.1)
Sodium: 139 mmol/L (ref 135–145)
Total Bilirubin: 0.8 mg/dL (ref 0.3–1.2)
Total Protein: 5.5 g/dL — ABNORMAL LOW (ref 6.5–8.1)

## 2018-09-03 LAB — GLUCOSE, CAPILLARY
Glucose-Capillary: 141 mg/dL — ABNORMAL HIGH (ref 70–99)
Glucose-Capillary: 99 mg/dL (ref 70–99)

## 2018-09-03 LAB — PROTIME-INR
INR: 1.98
Prothrombin Time: 22.4 seconds — ABNORMAL HIGH (ref 11.4–15.2)

## 2018-09-03 MED ORDER — AMLODIPINE BESYLATE 10 MG PO TABS: 10.0000 mg | ORAL_TABLET | Freq: Every day | ORAL | 1 refills | Status: DC

## 2018-09-03 MED ORDER — FUROSEMIDE 40 MG PO TABS: 40.0000 mg | ORAL_TABLET | Freq: Every day | ORAL | 0 refills | Status: DC

## 2018-09-03 MED ORDER — FLUTICASONE-SALMETEROL 100-50 MCG/DOSE IN AEPB: 1.0000 | INHALATION_SPRAY | Freq: Every day | RESPIRATORY_TRACT | Status: DC | PRN

## 2018-09-03 MED ORDER — POTASSIUM CHLORIDE CRYS ER 10 MEQ PO TBCR: 10.0000 meq | EXTENDED_RELEASE_TABLET | Freq: Every day | ORAL | 0 refills | Status: DC

## 2018-09-03 MED ORDER — TRAMADOL HCL 50 MG PO TABS: ORAL_TABLET | ORAL | 0 refills | Status: DC

## 2018-09-03 MED ORDER — CEPHALEXIN 500 MG PO CAPS: 500.0000 mg | ORAL_CAPSULE | Freq: Three times a day (TID) | ORAL | 0 refills | Status: DC

## 2018-09-03 NOTE — Progress Notes (Addendum)
      [address]       [place] [address]             [phone]        8 Days Post-Op Procedure(s) (LRB): CORONARY ARTERY BYPASS GRAFTING (CABG) x3 , Using left internal mammory artery and right leg greater saphronious vein. Harvested endoscopically. (N/A) TRANSESOPHAGEAL ECHOCARDIOGRAM (TEE) (N/A) LV PACING LEAD PLACEMENT (N/A)  Subjective: Patient without complaints this am. He wants to go home. He states his left hand is less swollen and painful.  Objective: Vital signs in last 24 hours: Temp:  [97.7 F (36.5 C)-98.2 F (36.8 C)] 97.9 F (36.6 C) (10/15 0400) Pulse Rate:  [79-101] 80 (10/15 0400) Cardiac Rhythm: Ventricular paced;A-V Sequential paced (10/14 2157) Resp:  [16-29] 20 (10/15 0654) BP: (127-158)/(66-89) 127/81 (10/15 0400) SpO2:  [94 %-100 %] 95 % (10/15 0400) Weight:  [98.3 kg] 98.3 kg (10/15 0654)  Pre op weight 99.2 kg Current Weight  09/03/18 98.3 kg      Intake/Output from previous day: 10/14 0701 - 10/15 0700 In: 1540 [P.O.:1430; I.V.:10; IV Piggyback:100] Out: 302 [Urine:302]   Physical Exam:  Cardiovascular: V paced Pulmonary: Diminished at bases Abdomen: Soft, non tender, bowel sounds present. Extremities: Mild bilateral lower extremity edema. Left arm and hand swelling. Motor/sensory intact Wounds: Clean and dry.  No erythema or signs of infection.  Lab Results: CBC:No results for input(s): WBC, HGB, HCT, PLT in the last 72 hours. BMET:  Recent Labs    09/02/18 0258 09/03/18 0356  NA 138 139  K 4.5 4.0  CL 99 97*  CO2 28 30  GLUCOSE 96 89  BUN 26* 24*  CREATININE 1.37* 1.35*  CALCIUM 8.7* 8.9    PT/INR:  Lab Results  Component Value Date   INR 1.98 09/03/2018   INR 1.52 09/02/2018   INR 1.46 09/01/2018   ABG:  INR: Will add last result for INR, ABG once components are confirmed Will add last 4 CBG results once components are confirmed  Assessment/Plan:  1. CV - V paced. On Amlodipine 10 mg  daily, Lopressor 12.5 mg bid, and Coumadin (history of DVT).  INR slightly increased to 1.98 this am. Per Dr. Prescott Gum, will discharge on Coumadin 5 mg daily. 2.  Pulmonary - On room air. Encourage incentive spirometer 3.  Acute blood loss anemia - H and H stable at 9.8 and 31.3 4. Creatinine remains 1.35  5. Mildly elevated AST  6. Thrombocytopenia-platelets 130,000 7. Volume overload- Will continue daily Lasix after dischage 8. ID-on Cefazolin for possible left hand cellulitis. Per Dr. Prescott Gum, will change to oral Keflex on discharge today 9. Discharge  Donielle M ZimmermanPA-C 09/03/2018,7:08 AM [phone]

## 2018-09-03 NOTE — Progress Notes (Signed)
Patient is ready for discharge. He is alert and oriented and his vital signs are stable. He has all of his belongings with him. He has had all of his questions regarding his discharge answered. Patient has been removed from telemetry, CCMD has been notified. IV has been removed without complication, Catheter intact and patient tolerated well. Chest tube sutures were removed without complication. Patient be transported home by his wife who is here with him now. Patient will leave the unit by wheelchair and meet his wife at the front entrance of the hospital.

## 2018-09-03 NOTE — Care Management Note (Signed)
Case Management Note Marvetta Gibbons RN, BSN Transitions of Care Unit 4E- RN Case Manager [phone]  Patient Details  Name: Sean [name] MRN: [id number] Date of Birth: Oct 18, 1941  Subjective/Objective:   Pt admitted s/p CABGx3                 Action/Plan: PTA pt lived at home, plan to return home, notified by cardiac rehab pt will need RW for home, order placed and call made to [facility] with [facility] for DME need- RW to be delivered to room prior to discharge. No other CM needs noted for transition home.   Expected Discharge Date:  09/03/18               Expected Discharge Plan:  Home/Self Care  In-House Referral:  NA  Discharge planning Services  CM Consult  Post Acute Care Choice:  Durable Medical Equipment Choice offered to:  Patient  DME Arranged:  Gilford Rile rolling DME Agency:  [place]:    [place]:     Status of Service:  Completed, signed off  If discussed at [place] of Stay Meetings, dates discussed:    Discharge Disposition: home/self care    Additional Comments:  Dawayne Patricia, RN 09/03/2018, 12:16 PM

## 2018-09-03 NOTE — Care Management Important Message (Signed)
Important Message  Patient Details  Name: Sean [name] MRN: [id number] Date of Birth: [birth year]   Medicare Important Message Given:  Yes    [name] P [name] 09/03/2018, 11:20 AM

## 2018-09-03 NOTE — Progress Notes (Signed)
CARDIAC REHAB PHASE I   Offered to walk with pt, pt declining at this time. Reinforced discharge education with pt. Pt grateful for the care he has received during his stay, but very ready to go home. Pt referred to [facility] [place].   [phone] Rufina Falco, RN BSN 09/03/2018 8:45 AM

## 2018-09-04 ENCOUNTER — Other Ambulatory Visit: Payer: Self-pay

## 2018-09-16 NOTE — Progress Notes (Signed)
Cardiology Office Note Date:  09/17/2018  Patient ID:  Sean, [name] Jun 23, 1941, MRN [id number] PCP:  [name], MD  Cardiologist:  Dr. Fletcher Anon, MD Electrophysiologist: Dr. Caryl Comes, MD    Chief Complaint: Hospital follow up  History of Present Illness: Sean [name] is a 77 y.o. 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   Patient previously underwent Myoview in 07/2014 that was negative for ischemia with an EF of 55-65%. He was admitted to the hospital in 05/2017 with dizziness and found to be in complete heart block. He underwent successful implantation of MDT dual-chamber PPM on 06/11/2017. In follow up in 08/2017 he was noted to have frequent PVCs on device interrogation of ~ 5.5% burden. Follow up echo in 08/2017 showed an EF of 50-55%, no RWMA, Gr1DD, mild MR, left atrium normal in size, pacer wire noted in the RV with normal RVSF, PASP normal. Follow up echo in 03/2018 showed a newly reduced EF of 35-40%, diffuse HK with HK of the apical, anteroseptal, and anterior myocardium, Gr1DD, mild MR, left atrium mildly dilated, pacer wire noted in the RV with normal RVSF, PASP normal. In follow up with EP on 04/30/18 there was continued frequent PVCs on device interrogation accounting for ~ > 9% burden. He underwent cardiac CTA to further assess his cardiomyopathy that showed evidence of 3-vessel CAD with a left dominant system and FFR less than 0.6 in all vessels. Because of this, he underwent LHC on 06/13/2018 that showed severe, heavily calcified 3-vessel CAD with subtotal occlusion of the mid LAD, ulcerated plaque in the  proximal LCx and a small RCA that was nondominant and diffusely diseased. It was recommended the patient be evaluated for CABG. Unfortunately, patient noted some left arm swelling following his cath (he underwent right radial artery approach). He was seen in the ED and noted to have a left subclavian DVT. He was started on Eliquis on 06/17/2018. He was seen by Dr. Prescott Gum on 06/25/2018 for initial consult of possible CABG with recommendation for CABG following improvement/treatment of his left subclavian DVT. Recommendation was to proceed in ~ 3-4 weeks time after repeat ultrasound of the left arm DVT as well as a CT of his chest to rule out mass or pathology in the left lung apex. Prior to undergoing CABG, he was admitted to [facility] in 06/2018 for likely vertigo with associated systolic hypertension and was started on meclizine. Follow up echo on 08/16/2018 ordered by EP to evaluate his cardiomyopathy showed an EF of 35-40%, diffuse HK, trivial MR, RVSF normal, pacer wire noted in the RA/RV, PASP 21. Patient underwent successful, planned 3-vessel CABG on 08/26/2018 with a LIMA to LAD, SVG to diagonal, SVG to LCx as well as placement of a permanent epicardial LV pacing lead for possible later biventricular pacing at the request of Dr. Caryl Comes. Postoperative coarse progressed slow. He briefly required some inotropic support. Discharge HGB of 9.8, with an MCV of 101, and PLT count of 130. He did have some worsening of left arm pain and swelling that slowly improved felt to possibly be some component of cellulitis from an IV site  and was treated with ABX. Prior to discharge, he was resumed on Coumadin for his previous upper extremity DVT. INR on day of discharge was 1.98. At discharge, he remained volume up with recommendation to continue outpatient diuresis with a documented weight on day of discharge of 98.3 kg.  Patient comes in accompanied by his wife today.  Since his hospital discharge she has done quite well.  He has  not had any symptoms concerning for further angina or shortness of breath.  He continues to advance activities as tolerated within surgical guidelines.  He has not had any falls, BRBPR, or melena.  No issues from his surgical site.  He has completed Keflex for left hand cellulitis.  He has also completed his 10 days of p.o. Lasix 40 mg daily.  With this, he noted vigorous urine output.  He continues to note some lower extremity swelling though improved from prior.  He is tolerating all medications without issues including aspirin and Coumadin.  He has not yet had his INR checked since his hospital discharge.  He has follow-up with CVTS on 10/02/2018.  He reports to follow-up with his PCP as scheduled for 02/2019.  No dizziness, presyncope, or syncope.  Past Medical History:  Diagnosis Date  . Arthritis   . Asthma   . CAD (coronary artery disease)    a. LHC 7/19: ostLM 30%, ostLAD 30%, p-mLAD 99% mod calcified, ostD1 90%, ost-pLCx 80% ulcerative & mod calcified, p-mLCx 50%, OM2 50%, mod dz LPDA, RCA small w/ diffuse dz throughout; b. recommendation of CABG; c. 3-V CABG 08/26/18 (LIMA-LAD, VG-Diag, VG-LCx)  . Chronic systolic CHF (congestive heart failure) (HCC)    a. EF 35-40%, diffuse HK with HK of the apical, anteroseptal, and anterior myocardium, Gr1DD, mild MR, mildly dilated LA, pacer wire noted in the RV with nl RVSF, PASP nl  . CKD (chronic kidney disease), stage III ([place])   . Deep vein thrombosis (DVT) of upper extremity ([place])    a. DVT of left subclavian dx'd 06/17/2018; b. Eliquis  . GERD (gastroesophageal reflux disease)    takes otc occasionally  . History of complete heart block    a. s/p MDT PPM 05/2017  . Hypertension   . Obesity   . Presence of permanent cardiac pacemaker    Dr. Beckie Salts implanted 05/2017  . PVC's (premature ventricular contractions)    a. PPM-induced    Past Surgical History:  Procedure Laterality Date  . BACK SURGERY    . CORONARY ARTERY BYPASS GRAFT N/A  08/26/2018   Procedure: CORONARY ARTERY BYPASS GRAFTING (CABG) x3 , Using left internal mammory artery and right leg greater saphronious vein. Harvested endoscopically.;  Surgeon: Ivin Poot, MD;  Location: [place];  Service: Open Heart Surgery;  Laterality: N/A;  . EPICARDIAL PACING LEAD PLACEMENT N/A 08/26/2018   Procedure: LV PACING LEAD PLACEMENT;  Surgeon: Ivin Poot, MD;  Location: [place];  Service: Thoracic;  Laterality: N/A;  . INSERT / REPLACE / REMOVE PACEMAKER     [employer]  . LEFT HEART CATH AND CORONARY ANGIOGRAPHY N/A 06/13/2018   Procedure: LEFT HEART CATH AND CORONARY ANGIOGRAPHY;  Surgeon: Wellington Hampshire, MD;  Location: [place] [facility];  Service: Cardiovascular;  Laterality: N/A;  . LEG SURGERY    . PACEMAKER IMPLANT N/A 06/11/2017   Procedure: Pacemaker Implant;  Surgeon: Evans Lance, MD;  Location: [place] [facility];  Service: Cardiovascular;  Laterality: N/A;  . TEE WITHOUT CARDIOVERSION N/A 08/26/2018  Procedure: TRANSESOPHAGEAL ECHOCARDIOGRAM (TEE);  Surgeon: Prescott Gum, Collier Salina, MD;  Location: [place];  Service: Open Heart Surgery;  Laterality: N/A;    Current Meds  Medication Sig  . albuterol (PROVENTIL HFA;VENTOLIN HFA) 108 (90 Base) MCG/ACT inhaler Inhale 1-2 puffs into the lungs every 4 (four) hours as needed for wheezing or shortness of breath.  . allopurinol (ZYLOPRIM) 100 MG tablet Take 100 mg by mouth 2 (two) times daily.  Marland Kitchen amLODipine (NORVASC) 10 MG tablet Take 1 tablet (10 mg total) by mouth daily.  Marland Kitchen aspirin EC 81 MG tablet Take 81 mg by mouth daily.  Marland Kitchen atorvastatin (LIPITOR) 40 MG tablet Take 1 tablet (40 mg total) by mouth daily.  . Fluticasone-Salmeterol (ADVAIR) 100-50 MCG/DOSE AEPB Inhale 1 puff into the lungs daily as needed (shortness of breath).  . metoprolol succinate (TOPROL-XL) 25 MG 24 hr tablet Take 1 tablet (25 mg total) by mouth daily.  . Multiple Vitamin (MULTIVITAMIN) tablet Take 1 tablet by mouth daily.  . NON FORMULARY Take 1  tablet by mouth daily. otc prostate multivitamin  . traMADol (ULTRAM) 50 MG tablet Take 50 mg by mouth every 4-6 hours PRN severe pain  . warfarin (COUMADIN) 5 MG tablet Take 5 mg by mouth at bedtime.     Allergies:   Azithromycin and Erythromycin   Social History:  The patient  reports that he has never smoked. He has never used smokeless tobacco. He reports that he drinks alcohol. He reports that he does not use drugs.   Family History:  The patient's family history includes Alcohol abuse in his brother and father; Diabetes in his brother; Stroke in his father, maternal grandfather, and mother.  ROS:   Review of Systems  Constitutional: Positive for malaise/fatigue. Negative for chills, diaphoresis, fever and weight loss.  HENT: Negative for congestion.   Eyes: Negative for discharge and redness.  Respiratory: Negative for cough, hemoptysis, sputum production, shortness of breath and wheezing.   Cardiovascular: Positive for leg swelling. Negative for chest pain, palpitations, orthopnea, claudication and PND.  Gastrointestinal: Negative for abdominal pain, blood in stool, heartburn, melena, nausea and vomiting.  Genitourinary: Negative for hematuria.  Musculoskeletal: Negative for falls and myalgias.  Skin: Negative for rash.       Resolving left hand swelling  Neurological: Positive for weakness. Negative for dizziness, tingling, tremors, sensory change, speech change, focal weakness and loss of consciousness.  Endo/Heme/Allergies: Does not bruise/bleed easily.  Psychiatric/Behavioral: Negative for substance abuse. The patient is not nervous/anxious.   All other systems reviewed and are negative.    PHYSICAL EXAM:  VS:  BP 116/68 (BP Location: Right Arm, Patient Position: Sitting, Cuff Size: Normal)   Pulse 80   Ht 5' 9.5" (1.765 m)   Wt 214 lb 12 oz (97.4 kg)   BMI 31.26 kg/m  BMI: Body mass index is 31.26 kg/m.  Physical Exam  Constitutional: He is oriented to person,  place, and time. He appears well-developed and well-nourished.  HENT:  Head: Normocephalic and atraumatic.  Eyes: Right eye exhibits no discharge. Left eye exhibits no discharge.  Neck: Normal range of motion. No JVD present.  Cardiovascular: Normal rate, regular rhythm, S1 normal, S2 normal and normal heart sounds. Exam reveals no distant heart sounds, no friction rub, no midsystolic click and no opening snap.  No murmur heard. Pulses:      Radial pulses are 2+ on the right side, and 2+ on the left side.       Posterior tibial  pulses are 2+ on the right side, and 2+ on the left side.  Well-healing median anterior chest wall surgical site.  Pulmonary/Chest: Effort normal and breath sounds normal. No respiratory distress. He has no decreased breath sounds. He has no wheezes. He has no rales. He exhibits no tenderness.  Abdominal: Soft. He exhibits no distension. There is no tenderness.  Musculoskeletal: He exhibits edema.  1+ bilateral lower extremity pitting edema to the mid shins.  Neurological: He is alert and oriented to person, place, and time.  Skin: Skin is warm and dry. No cyanosis. Nails show no clubbing.  Resolving left hand swelling with dry skin noted along the dorsal aspect of the hand  Psychiatric: He has a normal mood and affect. His speech is normal and behavior is normal. Judgment and thought content normal.     EKG:  Was ordered and interpreted by me today. Shows AV paced, 80 bpm  Recent Labs: 06/26/2018: TSH 3.141 08/28/2018: Magnesium 2.5 08/30/2018: Hemoglobin 9.8; Platelets 130 09/03/2018: ALT 17; BUN 24; Creatinine, Ser 1.35; Potassium 4.0; Sodium 139  06/26/2018: Cholesterol 116; HDL 43; LDL Cholesterol 60; Total CHOL/HDL Ratio 2.7; Triglycerides 64; VLDL 13   Estimated Creatinine Clearance: 54.1 mL/min (A) (by C-G formula based on SCr of 1.35 mg/dL (H)).   Wt Readings from Last 3 Encounters:  09/17/18 214 lb 12 oz (97.4 kg)  09/03/18 216 lb 11.4 oz (98.3 kg)    08/14/18 218 lb 12.8 oz (99.2 kg)     Other studies reviewed: Additional studies/records reviewed today include: summarized above  ASSESSMENT AND PLAN:  1. CAD involving the native coronary arteries s/p recent 3-V CABG on 08/26/18 without angina: He is doing well from a cardiac perspective without symptoms concerning for angina.  He continues to advance activities as tolerated with an surgical guidelines.  He has follow-up with cardiothoracic surgery on 10/02/2018.  He remains on aspirin 81 mg daily as well as Coumadin for left upper extremity DVT.  He will participate in cardiac rehab.  Continue Toprol and Lipitor.  Aggressive risk factor modification and secondary prevention.  2. HFrEF felt to be secondary to ICM with possible PVC component which have been felt to be secondary to PPM lead: He does not appear to be grossly volume overloaded though does have some bilateral lower extremity swelling.  He was diuresed with p.o. Lasix 40 mg daily for 10 days following his hospital discharge.  Given this, we will check a BMP today to evaluate his renal function.  He will need a repeat echocardiogram in approximately 2 to 3 months time to evaluate for improvement in his LV systolic function following revascularization and optimization of evidence-based, heart failure therapy.  Given his cardiomyopathy, we will discontinue amlodipine.  The calcium channel blocker may also be playing a role in some of his lower extremity swelling.  We will titrate his Toprol-XL to 50 mg daily.  In follow-up, if blood pressure allows would recommend initiation of ACE inhibitor/ARB/Entresto/spironolactone to optimize his evidence-based heart failure therapy.  If his EF remains low following revascularization and optimization of evidence-based heart failure therapy EP will make the determination as to whether to proceed with CRT upgrade.  3. Complete heart block: Status post PPM.  Followed by EP.  Device appears to be functioning  normally on twelve-lead EKG today.  4. Left upper extremity DVT: Remains on Coumadin.  Check PT/INR given this has not been checked since he left the hospital.  Goal INR 2.0-3.0.  This is being  followed by his PCP.   5. HTN: Blood pressure is well controlled today.  Discontinued amlodipine and escalated Toprol as detailed above.  6. CKD stage III: Check BMP.  7. HLD: LDL from 06/2018 of 60 with improving AST on 09/03/2018 and normal ALT.  Remains on Lipitor 40 mg daily.   8. Anemia/thrombocytopenia: Check CBC.  9. Acquired coagulopathy: Check PT/INR today.  We have contacted patient's PCP office to recommend he have repeat PT/INR checked next week as they will be the ones managing his Coumadin given he is on this medication for left upper extremity DVT and they started it.  10. Left hand swelling/cellulitis: Much improved.  Status post Keflex.  Recommend he follow-up with his PCP next week.  Disposition: F/u with Dr. Fletcher Anon or an APP in 1 month.  Follow-up with Dr. Caryl Comes 2 months.  We have contacted the patient's PCP office to recommend he have his hospital follow-up during the first week of November rather than his next scheduled appointment in April, 2020.  He also needs to have a PT/INR drawn next week at his PCPs office.  He inquires about a flu vaccine today though we do not have the dose recommended for 66 in order.  He has been advised to contact his PCP or go to a local pharmacy to have his flu shot given that we do not have the dose recommended for his age.  Of note, patient refused influenza vaccine while admitted.  Current medicines are reviewed at length with the patient today.  The patient did not have any concerns regarding medicines.  Signed, Christell Faith, PA-C 09/17/2018 10:07 AM     [facility] [address] [place], [place] [address] [phone]

## 2018-09-17 ENCOUNTER — Encounter: Payer: Self-pay | Admitting: Physician Assistant

## 2018-09-17 ENCOUNTER — Ambulatory Visit (INDEPENDENT_AMBULATORY_CARE_PROVIDER_SITE_OTHER): Payer: PPO | Admitting: Physician Assistant

## 2018-09-17 VITALS — BP 116/68 | HR 80 | Ht 69.5 in | Wt 214.8 lb

## 2018-09-17 DIAGNOSIS — D696 Thrombocytopenia, unspecified: Secondary | ICD-10-CM

## 2018-09-17 DIAGNOSIS — I82622 Acute embolism and thrombosis of deep veins of left upper extremity: Secondary | ICD-10-CM

## 2018-09-17 DIAGNOSIS — I442 Atrioventricular block, complete: Secondary | ICD-10-CM

## 2018-09-17 DIAGNOSIS — Z950 Presence of cardiac pacemaker: Secondary | ICD-10-CM | POA: Diagnosis not present

## 2018-09-17 DIAGNOSIS — D649 Anemia, unspecified: Secondary | ICD-10-CM

## 2018-09-17 DIAGNOSIS — Z951 Presence of aortocoronary bypass graft: Secondary | ICD-10-CM

## 2018-09-17 DIAGNOSIS — I10 Essential (primary) hypertension: Secondary | ICD-10-CM | POA: Diagnosis not present

## 2018-09-17 DIAGNOSIS — I25118 Atherosclerotic heart disease of native coronary artery with other forms of angina pectoris: Secondary | ICD-10-CM

## 2018-09-17 DIAGNOSIS — I2510 Atherosclerotic heart disease of native coronary artery without angina pectoris: Secondary | ICD-10-CM

## 2018-09-17 DIAGNOSIS — E782 Mixed hyperlipidemia: Secondary | ICD-10-CM

## 2018-09-17 MED ORDER — METOPROLOL SUCCINATE ER 50 MG PO TB24: 50.0000 mg | ORAL_TABLET | Freq: Every day | ORAL | 3 refills | Status: DC

## 2018-09-17 NOTE — Patient Instructions (Addendum)
Medication Instructions:  STOP the Amlodipine INCREASE the Metoprolol (Toprol) to 50 mg daily  If you need a refill on your cardiac medications before your next appointment, please call your pharmacy.   Lab work: Your provider would like for you to have the following labs today: BMET, PT/INR, CBC  If you have labs (blood work) drawn today and your tests are completely normal, you will receive your results only by: Marland Kitchen MyChart Message (if you have MyChart) OR . A paper copy in the mail If you have any lab test that is abnormal or we need to change your treatment, we will call you to review the results.  Follow-Up: At [facility], you and your health needs are our priority.  As part of our continuing mission to provide you with exceptional heart care, we have created designated Provider Care Teams.  These Care Teams include your primary Cardiologist (physician) and Advanced Practice Providers (APPs -  Physician Assistants and Nurse Practitioners) who all work together to provide you with the care you need, when you need it. You will need a follow up appointment in 1 month. You may see Dr. Fletcher Anon or one of the following Advanced Practice Providers on your designated Care Team:   Murray Hodgkins, NP Christell Faith, PA-C . Marrianne Mood, PA-C

## 2018-09-18 LAB — PROTIME-INR
INR: 2.5 — ABNORMAL HIGH (ref 0.8–1.2)
Prothrombin Time: 23.8 s — ABNORMAL HIGH (ref 9.1–12.0)

## 2018-09-18 LAB — BASIC METABOLIC PANEL
BUN/Creatinine Ratio: 15 (ref 10–24)
BUN: 21 mg/dL (ref 8–27)
CO2: 24 mmol/L (ref 20–29)
Calcium: 9.3 mg/dL (ref 8.6–10.2)
Chloride: 100 mmol/L (ref 96–106)
Creatinine, Ser: 1.4 mg/dL — ABNORMAL HIGH (ref 0.76–1.27)
GFR calc Af Amer: 56 mL/min/{1.73_m2} — ABNORMAL LOW (ref >59)
GFR calc non Af Amer: 48 mL/min/{1.73_m2} — ABNORMAL LOW (ref >59)
Glucose: 119 mg/dL — ABNORMAL HIGH (ref 65–99)
Potassium: 5.1 mmol/L (ref 3.5–5.2)
Sodium: 140 mmol/L (ref 134–144)

## 2018-09-18 LAB — CBC
Hematocrit: 32.5 % — ABNORMAL LOW (ref 37.5–51.0)
Hemoglobin: 11.2 g/dL — ABNORMAL LOW (ref 13.0–17.7)
MCH: 32.1 pg (ref 26.6–33.0)
MCHC: 34.5 g/dL (ref 31.5–35.7)
MCV: 93 fL (ref 79–97)
Platelets: 370 10*3/uL (ref 150–450)
RBC: 3.49 x10E6/uL — ABNORMAL LOW (ref 4.14–5.80)
RDW: 12.6 % (ref 12.3–15.4)
WBC: 6.8 10*3/uL (ref 3.4–10.8)

## 2018-09-19 ENCOUNTER — Other Ambulatory Visit: Payer: Self-pay | Admitting: Cardiothoracic Surgery

## 2018-09-19 ENCOUNTER — Other Ambulatory Visit: Payer: Self-pay

## 2018-09-19 DIAGNOSIS — I2510 Atherosclerotic heart disease of native coronary artery without angina pectoris: Secondary | ICD-10-CM

## 2018-09-19 DIAGNOSIS — I509 Heart failure, unspecified: Secondary | ICD-10-CM

## 2018-09-19 DIAGNOSIS — Z951 Presence of aortocoronary bypass graft: Secondary | ICD-10-CM

## 2018-09-19 NOTE — Progress Notes (Signed)
Notes recorded by Rise Mu, PA-C on 09/19/2018 at 2:08 PM EDT Echo should be performed in 2-3 months following revascularization as the heart needs time to heal.

## 2018-09-22 ENCOUNTER — Emergency Department: Payer: PPO

## 2018-09-22 ENCOUNTER — Encounter (HOSPITAL_COMMUNITY): Payer: Self-pay

## 2018-09-22 ENCOUNTER — Other Ambulatory Visit: Payer: Self-pay

## 2018-09-22 ENCOUNTER — Emergency Department
Admission: EM | Admit: 2018-09-22 | Discharge: 2018-09-22 | Disposition: A | Discharge status: Other Acute Inpatient Hospital | Payer: PPO | Attending: Emergency Medicine | Admitting: Emergency Medicine

## 2018-09-22 ENCOUNTER — Inpatient Hospital Stay (HOSPITAL_COMMUNITY)
Admission: AD | Admit: 2018-09-22 | Discharge: 2018-09-28 | DRG: 908 | Disposition: A | Discharge status: Home / Self Care | Payer: PPO | Source: Other Acute Inpatient Hospital | Attending: Cardiothoracic Surgery | Admitting: Cardiothoracic Surgery

## 2018-09-22 DIAGNOSIS — Z7901 Long term (current) use of anticoagulants: Secondary | ICD-10-CM | POA: Diagnosis not present

## 2018-09-22 DIAGNOSIS — R0602 Shortness of breath: Secondary | ICD-10-CM | POA: Diagnosis not present

## 2018-09-22 DIAGNOSIS — I442 Atrioventricular block, complete: Secondary | ICD-10-CM | POA: Diagnosis not present

## 2018-09-22 DIAGNOSIS — N183 Chronic kidney disease, stage 3 (moderate): Secondary | ICD-10-CM | POA: Diagnosis present

## 2018-09-22 DIAGNOSIS — I2510 Atherosclerotic heart disease of native coronary artery without angina pectoris: Secondary | ICD-10-CM | POA: Insufficient documentation

## 2018-09-22 DIAGNOSIS — D62 Acute posthemorrhagic anemia: Secondary | ICD-10-CM | POA: Diagnosis not present

## 2018-09-22 DIAGNOSIS — R55 Syncope and collapse: Secondary | ICD-10-CM | POA: Diagnosis not present

## 2018-09-22 DIAGNOSIS — E669 Obesity, unspecified: Secondary | ICD-10-CM | POA: Diagnosis present

## 2018-09-22 DIAGNOSIS — J939 Pneumothorax, unspecified: Secondary | ICD-10-CM

## 2018-09-22 DIAGNOSIS — I5022 Chronic systolic (congestive) heart failure: Secondary | ICD-10-CM | POA: Insufficient documentation

## 2018-09-22 DIAGNOSIS — Z951 Presence of aortocoronary bypass graft: Secondary | ICD-10-CM

## 2018-09-22 DIAGNOSIS — J90 Pleural effusion, not elsewhere classified: Secondary | ICD-10-CM | POA: Diagnosis present

## 2018-09-22 DIAGNOSIS — Z79899 Other long term (current) drug therapy: Secondary | ICD-10-CM | POA: Insufficient documentation

## 2018-09-22 DIAGNOSIS — M1990 Unspecified osteoarthritis, unspecified site: Secondary | ICD-10-CM | POA: Diagnosis present

## 2018-09-22 DIAGNOSIS — S27321A Contusion of lung, unilateral, initial encounter: Secondary | ICD-10-CM | POA: Diagnosis not present

## 2018-09-22 DIAGNOSIS — I130 Hypertensive heart and chronic kidney disease with heart failure and stage 1 through stage 4 chronic kidney disease, or unspecified chronic kidney disease: Secondary | ICD-10-CM | POA: Insufficient documentation

## 2018-09-22 DIAGNOSIS — Z4682 Encounter for fitting and adjustment of non-vascular catheter: Secondary | ICD-10-CM | POA: Diagnosis not present

## 2018-09-22 DIAGNOSIS — J942 Hemothorax: Secondary | ICD-10-CM | POA: Insufficient documentation

## 2018-09-22 DIAGNOSIS — I714 Abdominal aortic aneurysm, without rupture: Secondary | ICD-10-CM | POA: Diagnosis present

## 2018-09-22 DIAGNOSIS — R42 Dizziness and giddiness: Secondary | ICD-10-CM | POA: Diagnosis not present

## 2018-09-22 DIAGNOSIS — K219 Gastro-esophageal reflux disease without esophagitis: Secondary | ICD-10-CM | POA: Diagnosis present

## 2018-09-22 DIAGNOSIS — J9811 Atelectasis: Secondary | ICD-10-CM | POA: Diagnosis not present

## 2018-09-22 DIAGNOSIS — Z6831 Body mass index (BMI) 31.0-31.9, adult: Secondary | ICD-10-CM

## 2018-09-22 DIAGNOSIS — Z86718 Personal history of other venous thrombosis and embolism: Secondary | ICD-10-CM | POA: Diagnosis not present

## 2018-09-22 DIAGNOSIS — Z881 Allergy status to other antibiotic agents status: Secondary | ICD-10-CM

## 2018-09-22 DIAGNOSIS — J45909 Unspecified asthma, uncomplicated: Secondary | ICD-10-CM | POA: Diagnosis present

## 2018-09-22 DIAGNOSIS — T8189XA Other complications of procedures, not elsewhere classified, initial encounter: Principal | ICD-10-CM | POA: Diagnosis present

## 2018-09-22 DIAGNOSIS — R791 Abnormal coagulation profile: Secondary | ICD-10-CM | POA: Diagnosis present

## 2018-09-22 DIAGNOSIS — Z7982 Long term (current) use of aspirin: Secondary | ICD-10-CM | POA: Diagnosis not present

## 2018-09-22 DIAGNOSIS — Z9889 Other specified postprocedural states: Secondary | ICD-10-CM

## 2018-09-22 DIAGNOSIS — I255 Ischemic cardiomyopathy: Secondary | ICD-10-CM | POA: Diagnosis present

## 2018-09-22 DIAGNOSIS — Y838 Other surgical procedures as the cause of abnormal reaction of the patient, or of later complication, without mention of misadventure at the time of the procedure: Secondary | ICD-10-CM | POA: Diagnosis present

## 2018-09-22 DIAGNOSIS — Z950 Presence of cardiac pacemaker: Secondary | ICD-10-CM | POA: Diagnosis not present

## 2018-09-22 DIAGNOSIS — Z452 Encounter for adjustment and management of vascular access device: Secondary | ICD-10-CM | POA: Diagnosis not present

## 2018-09-22 LAB — CBC WITH DIFFERENTIAL/PLATELET
Abs Immature Granulocytes: 0.05 10*3/uL (ref 0.00–0.07)
Basophils Absolute: 0.1 10*3/uL (ref 0.0–0.1)
Basophils Relative: 1 %
Eosinophils Absolute: 0.8 10*3/uL — ABNORMAL HIGH (ref 0.0–0.5)
Eosinophils Relative: 9 %
HCT: 35.7 % — ABNORMAL LOW (ref 39.0–52.0)
Hemoglobin: 11 g/dL — ABNORMAL LOW (ref 13.0–17.0)
Immature Granulocytes: 1 %
Lymphocytes Relative: 24 %
Lymphs Abs: 2.1 10*3/uL (ref 0.7–4.0)
MCH: 31.5 pg (ref 26.0–34.0)
MCHC: 30.8 g/dL (ref 30.0–36.0)
MCV: 102.3 fL — ABNORMAL HIGH (ref 80.0–100.0)
Monocytes Absolute: 0.7 10*3/uL (ref 0.1–1.0)
Monocytes Relative: 8 %
Neutro Abs: 5.1 10*3/uL (ref 1.7–7.7)
Neutrophils Relative %: 57 %
Platelets: 269 10*3/uL (ref 150–400)
RBC: 3.49 MIL/uL — ABNORMAL LOW (ref 4.22–5.81)
RDW: 12.9 % (ref 11.5–15.5)
WBC: 8.8 10*3/uL (ref 4.0–10.5)
nRBC: 0 % (ref 0.0–0.2)

## 2018-09-22 LAB — COMPREHENSIVE METABOLIC PANEL
ALT: 10 U/L (ref 0–44)
AST: 21 U/L (ref 15–41)
Albumin: 3.5 g/dL (ref 3.5–5.0)
Alkaline Phosphatase: 55 U/L (ref 38–126)
Anion gap: 10 (ref 5–15)
BUN: 25 mg/dL — ABNORMAL HIGH (ref 8–23)
CO2: 24 mmol/L (ref 22–32)
Calcium: 8.6 mg/dL — ABNORMAL LOW (ref 8.9–10.3)
Chloride: 104 mmol/L (ref 98–111)
Creatinine, Ser: 1.62 mg/dL — ABNORMAL HIGH (ref 0.61–1.24)
GFR calc Af Amer: 46 mL/min — ABNORMAL LOW (ref >60)
GFR calc non Af Amer: 40 mL/min — ABNORMAL LOW (ref >60)
Glucose, Bld: 236 mg/dL — ABNORMAL HIGH (ref 70–99)
Potassium: 4.9 mmol/L (ref 3.5–5.1)
Sodium: 138 mmol/L (ref 135–145)
Total Bilirubin: 0.9 mg/dL (ref 0.3–1.2)
Total Protein: 6.1 g/dL — ABNORMAL LOW (ref 6.5–8.1)

## 2018-09-22 LAB — MRSA PCR SCREENING: MRSA by PCR: NEGATIVE

## 2018-09-22 LAB — PROTIME-INR
INR: 2.54
Prothrombin Time: 27 seconds — ABNORMAL HIGH (ref 11.4–15.2)

## 2018-09-22 LAB — TROPONIN I: Troponin I: <0.03 ng/mL (ref <0.03)

## 2018-09-22 MED ORDER — ACETAMINOPHEN 650 MG RE SUPP: 650.0000 mg | Freq: Four times a day (QID) | RECTAL | Status: DC | PRN

## 2018-09-22 MED ORDER — METOPROLOL SUCCINATE ER 50 MG PO TB24
50.0000 mg | ORAL_TABLET | Freq: Every day | ORAL | Status: DC
  Administered 2018-09-23 – 2018-09-24 (×2): 50 mg via ORAL
  Filled 2018-09-22 (×2): qty 1

## 2018-09-22 MED ORDER — MOMETASONE FURO-FORMOTEROL FUM 100-5 MCG/ACT IN AERO
2.0000 | INHALATION_SPRAY | Freq: Two times a day (BID) | RESPIRATORY_TRACT | Status: DC
  Administered 2018-09-24 – 2018-09-28 (×6): 2 via RESPIRATORY_TRACT
  Filled 2018-09-22 (×3): qty 8.8

## 2018-09-22 MED ORDER — IOPAMIDOL (ISOVUE-370) INJECTION 76%
80.0000 mL | Freq: Once | INTRAVENOUS | Status: AC | PRN
  Administered 2018-09-22: 80 mL via INTRAVENOUS

## 2018-09-22 MED ORDER — OXYCODONE HCL 5 MG PO TABS: 5.0000 mg | ORAL_TABLET | ORAL | Status: DC | PRN

## 2018-09-22 MED ORDER — SODIUM CHLORIDE 0.9% FLUSH: 3.0000 mL | INTRAVENOUS | Status: DC | PRN

## 2018-09-22 MED ORDER — ASPIRIN EC 81 MG PO TBEC
81.0000 mg | DELAYED_RELEASE_TABLET | Freq: Every day | ORAL | Status: DC
  Administered 2018-09-23: 81 mg via ORAL
  Filled 2018-09-22: qty 1

## 2018-09-22 MED ORDER — ACETAMINOPHEN 325 MG PO TABS: 650.0000 mg | ORAL_TABLET | Freq: Four times a day (QID) | ORAL | Status: DC | PRN

## 2018-09-22 MED ORDER — FENTANYL CITRATE (PF) 100 MCG/2ML IJ SOLN
50.0000 ug | Freq: Once | INTRAMUSCULAR | Status: AC
  Administered 2018-09-22: 50 ug via INTRAVENOUS
  Filled 2018-09-22: qty 2

## 2018-09-22 MED ORDER — SODIUM CHLORIDE 0.9 % IV SOLN: 250.0000 mL | INTRAVENOUS | Status: DC | PRN

## 2018-09-22 MED ORDER — SODIUM CHLORIDE 0.9% IV SOLUTION
Freq: Once | INTRAVENOUS | Status: AC
  Administered 2018-09-22: 20:00:00 via INTRAVENOUS

## 2018-09-22 MED ORDER — ALBUTEROL SULFATE (2.5 MG/3ML) 0.083% IN NEBU: 3.0000 mL | INHALATION_SOLUTION | RESPIRATORY_TRACT | Status: DC | PRN

## 2018-09-22 MED ORDER — SODIUM CHLORIDE 0.9 % IV BOLUS
1000.0000 mL | Freq: Once | INTRAVENOUS | Status: AC
  Administered 2018-09-22: 1000 mL via INTRAVENOUS

## 2018-09-22 MED ORDER — ONDANSETRON HCL 4 MG/2ML IJ SOLN
INTRAMUSCULAR | Status: AC
  Administered 2018-09-22: 4 mg via INTRAVENOUS
  Filled 2018-09-22: qty 2

## 2018-09-22 MED ORDER — SODIUM CHLORIDE 0.9% FLUSH
3.0000 mL | Freq: Two times a day (BID) | INTRAVENOUS | Status: DC
  Administered 2018-09-22 – 2018-09-23 (×3): 3 mL via INTRAVENOUS

## 2018-09-22 MED ORDER — SENNOSIDES-DOCUSATE SODIUM 8.6-50 MG PO TABS
1.0000 | ORAL_TABLET | Freq: Every evening | ORAL | Status: DC | PRN
  Administered 2018-09-22: 1 via ORAL
  Filled 2018-09-22: qty 1

## 2018-09-22 MED ORDER — SODIUM CHLORIDE 0.9% FLUSH
3.0000 mL | Freq: Two times a day (BID) | INTRAVENOUS | Status: DC
  Administered 2018-09-23 (×2): 3 mL via INTRAVENOUS

## 2018-09-22 MED ORDER — ADULT MULTIVITAMIN W/MINERALS CH
1.0000 | ORAL_TABLET | Freq: Every day | ORAL | Status: DC
  Administered 2018-09-23 – 2018-09-28 (×5): 1 via ORAL
  Filled 2018-09-22 (×7): qty 1

## 2018-09-22 MED ORDER — IOPAMIDOL (ISOVUE-370) INJECTION 76%: 100.0000 mL | Freq: Once | INTRAVENOUS | Status: DC | PRN

## 2018-09-22 MED ORDER — ONDANSETRON HCL 4 MG/2ML IJ SOLN
4.0000 mg | Freq: Four times a day (QID) | INTRAMUSCULAR | Status: DC | PRN
  Administered 2018-09-23: 4 mg via INTRAVENOUS
  Filled 2018-09-22: qty 2

## 2018-09-22 MED ORDER — ALLOPURINOL 100 MG PO TABS
100.0000 mg | ORAL_TABLET | Freq: Two times a day (BID) | ORAL | Status: DC
  Administered 2018-09-23 – 2018-09-28 (×10): 100 mg via ORAL
  Filled 2018-09-22 (×11): qty 1

## 2018-09-22 MED ORDER — TRAMADOL HCL 50 MG PO TABS
50.0000 mg | ORAL_TABLET | Freq: Four times a day (QID) | ORAL | Status: DC | PRN
  Administered 2018-09-22 – 2018-09-23 (×2): 50 mg via ORAL
  Filled 2018-09-22 (×2): qty 1

## 2018-09-22 MED ORDER — ATORVASTATIN CALCIUM 40 MG PO TABS
40.0000 mg | ORAL_TABLET | Freq: Every day | ORAL | Status: DC
  Administered 2018-09-23 – 2018-09-28 (×5): 40 mg via ORAL
  Filled 2018-09-22 (×5): qty 1

## 2018-09-22 MED ORDER — SORBITOL 70 % SOLN
30.0000 mL | Freq: Every day | Status: DC | PRN
  Filled 2018-09-22: qty 30

## 2018-09-22 MED ORDER — ONDANSETRON HCL 4 MG PO TABS: 4.0000 mg | ORAL_TABLET | Freq: Four times a day (QID) | ORAL | Status: DC | PRN

## 2018-09-22 MED ORDER — ONDANSETRON HCL 4 MG/2ML IJ SOLN
4.0000 mg | Freq: Once | INTRAMUSCULAR | Status: AC
  Administered 2018-09-22: 4 mg via INTRAVENOUS

## 2018-09-22 NOTE — ED Notes (Signed)
CT found this RN and told this RN that pt is asking for pain medicine. Per Dr. Archie Balboa, pt needs to go to CT at this time, BP is too low for pain medication at this time. Will check BP when pt returns from CT.

## 2018-09-22 NOTE — ED Notes (Signed)
[employer] Pacemaker interrogated by this RN.

## 2018-09-22 NOTE — ED Provider Notes (Signed)
[facility] Emergency Department Provider Note  ____________________________________________   I have reviewed the triage vital signs and the nursing notes.   HISTORY  Chief Complaint Loss of Consciousness   History limited by: Not Limited   HPI Sean [name] is a 77 y.o. male who presents to the emergency department today via EMS as emergency traffic because of concerns for syncopal episode.  The patient states that since the time he woke up this morning had been feeling a little off.  He had been feeling somewhat weak and nauseous.  He denies any concurrent chest pain or palpitations.  He then had a syncopal episode.  Per EMS upon arriving on the scene the patient was quite pale.  He was in and out of responsiveness and had systolic blood pressure in the 70s.  Patient denies any recent illness or fevers.   Per medical record review patient has a history of asthma, CHF, CAD, medtronic pacemaker  Past Medical History:  Diagnosis Date  . Arthritis   . Asthma   . CAD (coronary artery disease)    a. LHC 7/19: ostLM 30%, ostLAD 30%, p-mLAD 99% mod calcified, ostD1 90%, ost-pLCx 80% ulcerative & mod calcified, p-mLCx 50%, OM2 50%, mod dz LPDA, RCA small w/ diffuse dz throughout; b. recommendation of CABG; c. 3-V CABG 08/26/18 (LIMA-LAD, VG-Diag, VG-LCx)  . Chronic systolic CHF (congestive heart failure) (HCC)    a. EF 35-40%, diffuse HK with HK of the apical, anteroseptal, and anterior myocardium, Gr1DD, mild MR, mildly dilated LA, pacer wire noted in the RV with nl RVSF, PASP nl  . CKD (chronic kidney disease), stage III ([place])   . Deep vein thrombosis (DVT) of upper extremity ([place])    a. DVT of left subclavian dx'd 06/17/2018; b. Eliquis  . GERD (gastroesophageal reflux disease)    takes otc occasionally  . History of complete heart block    a. s/p MDT PPM 05/2017  . Hypertension   . Obesity   . Presence of permanent cardiac pacemaker    Dr. Beckie Salts implanted  05/2017  . PVC's (premature ventricular contractions)    a. PPM-induced    Patient Active Problem List   Diagnosis Date Noted  . 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 Vertigo 06/26/2018  . Coronary artery disease   . Shortness of breath   . Complete heart block ([place]) 06/11/2017  . Acute kidney injury superimposed on CKD ([place]) 06/11/2017  . Obesity 06/11/2017  . Gout 06/11/2017  . Acute CHF (congestive heart failure) ([place]) 06/11/2017    Past Surgical History:  Procedure Laterality Date  . BACK SURGERY    . CORONARY ARTERY BYPASS GRAFT N/A 08/26/2018   Procedure: CORONARY ARTERY BYPASS GRAFTING (CABG) x3 , Using left internal mammory artery and right leg greater saphronious vein. Harvested endoscopically.;  Surgeon: Ivin Poot, MD;  Location: [place];  Service: Open Heart Surgery;  Laterality: N/A;  . EPICARDIAL PACING LEAD PLACEMENT N/A 08/26/2018   Procedure: LV PACING LEAD PLACEMENT;  Surgeon: Ivin Poot, MD;  Location: [place];  Service: Thoracic;  Laterality: N/A;  . INSERT / REPLACE / REMOVE PACEMAKER     MEDTRONIC  . LEFT HEART CATH AND CORONARY ANGIOGRAPHY N/A 06/13/2018   Procedure: LEFT HEART CATH AND CORONARY ANGIOGRAPHY;  Surgeon: Wellington Hampshire, MD;  Location: [place] [facility];  Service: Cardiovascular;  Laterality: N/A;  . LEG SURGERY    . PACEMAKER IMPLANT N/A 06/11/2017   Procedure: Pacemaker  Implant;  Surgeon: Evans Lance, MD;  Location: [place] [facility];  Service: Cardiovascular;  Laterality: N/A;  . TEE WITHOUT CARDIOVERSION N/A 08/26/2018   Procedure: TRANSESOPHAGEAL ECHOCARDIOGRAM (TEE);  Surgeon: Prescott Gum, Collier [place], MD;  Location: [place];  Service: Open Heart Surgery;  Laterality: N/A;    Prior to Admission medications   Medication Sig Start Date End Date Taking? Authorizing Provider  albuterol (PROVENTIL HFA;VENTOLIN HFA) 108 (90 Base) MCG/ACT inhaler Inhale 1-2 puffs into the lungs every 4 (four) hours as needed for wheezing or shortness of breath.     [provider]  allopurinol (ZYLOPRIM) 100 MG tablet Take 100 mg by mouth 2 (two) times daily.    [provider]  aspirin EC 81 MG tablet Take 81 mg by mouth daily.    [provider]  atorvastatin (LIPITOR) 40 MG tablet Take 1 tablet (40 mg total) by mouth daily. 06/07/18 09/17/18  Wellington Hampshire, MD  Fluticasone-Salmeterol (ADVAIR) 100-50 MCG/DOSE AEPB Inhale 1 puff into the lungs daily as needed (shortness of breath). 09/03/18   Nani Skillern, PA-C  metoprolol succinate (TOPROL-XL) 50 MG 24 hr tablet Take 1 tablet (50 mg total) by mouth daily. 09/17/18   Rise Mu, PA-C  Multiple Vitamin (MULTIVITAMIN) tablet Take 1 tablet by mouth daily.    [provider]  NON FORMULARY Take 1 tablet by mouth daily. otc prostate multivitamin    [provider]  traMADol (ULTRAM) 50 MG tablet Take 50 mg by mouth every 4-6 hours PRN severe pain 09/03/18   Nani Skillern, PA-C  warfarin (COUMADIN) 5 MG tablet Take 5 mg by mouth at bedtime.     [provider]    Allergies Azithromycin and Erythromycin  Family History  Problem Relation Age of Onset  . Stroke Mother   . Stroke Father   . Alcohol abuse Father   . Diabetes Brother   . Alcohol abuse Brother   . Stroke Maternal Grandfather     Social History Social History   Tobacco Use  . Smoking status: Never Smoker  . Smokeless tobacco: Never Used  Substance Use Topics  . Alcohol use: Yes    Comment: occ.  . Drug use: No    Review of Systems Constitutional: No fever/chills Eyes: No visual changes. ENT: No sore throat. Cardiovascular: Denies chest pain. Respiratory: Denies shortness of breath. Gastrointestinal: Positive for nausea.  Genitourinary: Negative for dysuria. Musculoskeletal: Negative for back pain. Skin: Negative for rash. Neurological: Negative for headaches, focal weakness or numbness.  ____________________________________________   PHYSICAL  EXAM:  VITAL SIGNS: ED Triage Vitals  Enc Vitals Group     BP 09/22/18 1147 102/61     Pulse --      Resp 09/22/18 1147 15     Temp 09/22/18 1150 97.7 F (36.5 C)     Temp Source 09/22/18 1150 Oral     SpO2 09/22/18 1147 97 %     Weight 09/22/18 1149 215 lb (97.5 kg)     Height 09/22/18 1149 5\' 9"  (1.753 m)     Head Circumference --      Peak Flow --      Pain Score 09/22/18 1148 0   Constitutional: Alert and oriented.  Eyes: Conjunctivae are normal.  ENT      Head: Normocephalic and atraumatic.      Nose: No congestion/rhinnorhea.      Mouth/Throat: Mucous membranes are moist.      Neck: No stridor.  Hematological/Lymphatic/Immunilogical: No cervical lymphadenopathy. Cardiovascular: Normal rate, regular rhythm.  No murmurs, rubs, or gallops. Respiratory: Normal respiratory effort without tachypnea nor retractions. Breath sounds are clear and equal bilaterally. No wheezes/rales/rhonchi. Gastrointestinal: Soft and non tender. No rebound. No guarding.  Genitourinary: Deferred Musculoskeletal: Normal range of motion in all extremities. No lower extremity edema. Neurologic:  Normal speech and language. No gross focal neurologic deficits are appreciated.  Skin:  Skin is warm, dry and intact. No rash noted. Psychiatric: Mood and affect are normal. Speech and behavior are normal. Patient exhibits appropriate insight and judgment.  ____________________________________________    LABS (pertinent positives/negatives)  INR 2.54 Trop <0.03 CBC wbc 8.8, hgb 11.0, plt 269 CMP na 138, k 4.9, glu 236, cr 1.62  ____________________________________________   EKG  I, Nance Pear, attending physician, personally viewed and interpreted this EKG  EKG Time: 1145 Rate: 81 Rhythm: AV dual paced rhythm Axis: normal Intervals: qtc 523 QRS: wide complex ST changes: no st elevation equivalent Impression: abnormal ekg  ____________________________________________     RADIOLOGY  CT dissection Large left hemothorax  I, Nance Pear, personally discussed these images and results by phone with the on-call radiologist and used this discussion as part of my medical decision making.   ____________________________________________   PROCEDURES  Procedures  ____________________________________________   INITIAL IMPRESSION / ASSESSMENT AND PLAN / ED COURSE  Pertinent labs & imaging results that were available during my care of the patient were reviewed by me and considered in my medical decision making (see chart for details).   Patient presented to the emergency department today because of concern for a syncopal episode. The patient was hypotensive for EMS and had low blood pressure initially in the ed. Patient did undergo cabg almost one month ago. Etiology of the patient's symptoms were unclear. Ddx would include anemia, cardiac etiology, arrhythmia, vasovagal, infection amongst other etiologies. Patient did start complaining of back pain. Given concern for possible aortic disease CT angio was performed. This was most concerning for large hemothorax on left side. Given this finding did discuss with cardiothoracic surgery at [facility] who agreed to accept the patient in transfer.   ____________________________________________   FINAL CLINICAL IMPRESSION(S) / ED DIAGNOSES  Final diagnoses:  Syncope and collapse  Hemothorax on left     Note: This dictation was prepared with Dragon dictation. Any transcriptional errors that result from this process are unintentional     Nance Pear, MD 09/22/18 [phone]

## 2018-09-22 NOTE — H&P (Signed)
[address]       [place] [address]             [phone]      Cardiothoracic Surgery Admission History and Physical   Sean [name] is an 77 y.o. male.   Chief Complaint: Left hemothorax status post coronary bypass graft surgery HPI:   The patient is a 77 year old gentleman who underwent coronary artery bypass graft surgery by Dr. Darcey Nora on 08/26/2018.  He also had a epicardial left ventricular pacing lead placed in case the patient needed upgrade of his permanent dual-chamber pacemaker to a biventricular device.  This lead was tunneled anteriorly through the chest wall up to the left anterior chest wall just below the pacemaker generator and was placed in a separate incision there.  The patient had a history of a recent left upper extremity DVT prior to coronary bypass surgery and was treated with a course of Eliquis prior to surgery and then sent home on Coumadin postop.  His INR on 09/17/2018 was 2.5 and today it was 2.54.  The patient saw Christell Faith, PA-C with cardiology on 09/17/2018 and he was doing well.  By his report he said that he got up out of bed this afternoon and was walking around his bed to go the bathroom and suddenly felt dizzy and sat down on the floor and fell backwards.  He said he felt much better after he laid down.  His wife found him on the floor and called EMS.  He denies passing out.  He has had a history of similar symptoms in the past.  In the [facility] emergency room he had a chest x-ray that showed a left pleural effusion.  A CTA of the chest, abdomen, and pelvis showed a large left pleural effusion that look like hemothorax.  There was a subpleural hematoma anteriorly in the area where the pacemaker lead had tunneled out through the anterior left chest wall.  There is no sign of aortic dissection.  There is no pulmonary embolus.  Patient has a  3.6 x 3.5 cm abdominal aortic aneurysm.  His hemoglobin at [facility] ER was 11.0 which is not  significant change from his hemoglobin on 09/17/2018 when it was 11.2.  His hemoglobin prior to that was 9.8 on 08/30/2018.  Was called by the [facility] emergency room physician and reviewed the CTA and felt the patient should be transferred to [facility] for further evaluation.  He arrived in stable condition.  Past Medical History:  Diagnosis Date  . Arthritis   . Asthma   . CAD (coronary artery disease)    a. LHC 7/19: ostLM 30%, ostLAD 30%, p-mLAD 99% mod calcified, ostD1 90%, ost-pLCx 80% ulcerative & mod calcified, p-mLCx 50%, OM2 50%, mod dz LPDA, RCA small w/ diffuse dz throughout; b. recommendation of CABG; c. 3-V CABG 08/26/18 (LIMA-LAD, VG-Diag, VG-LCx)  . Chronic systolic CHF (congestive heart failure) (HCC)    a. EF 35-40%, diffuse HK with HK of the apical, anteroseptal, and anterior myocardium, Gr1DD, mild MR, mildly dilated LA, pacer wire noted in the RV with nl RVSF, PASP nl  . CKD (chronic kidney disease), stage III ([place])   . Deep vein thrombosis (DVT) of upper extremity ([place])    a. DVT of left subclavian dx'd 06/17/2018; b. Eliquis  . GERD (gastroesophageal reflux disease)    takes otc occasionally  . History of complete heart block    a. s/p MDT PPM 05/2017  .  Hypertension   . Obesity   . Presence of permanent cardiac pacemaker    Dr. Beckie Salts implanted 05/2017  . PVC's (premature ventricular contractions)    a. PPM-induced    Past Surgical History:  Procedure Laterality Date  . BACK SURGERY    . CORONARY ARTERY BYPASS GRAFT N/A 08/26/2018   Procedure: CORONARY ARTERY BYPASS GRAFTING (CABG) x3 , Using left internal mammory artery and right leg greater saphronious vein. Harvested endoscopically.;  Surgeon: Ivin Poot, MD;  Location: [place];  Service: Open Heart Surgery;  Laterality: N/A;  . EPICARDIAL PACING LEAD PLACEMENT N/A 08/26/2018   Procedure: LV PACING LEAD PLACEMENT;  Surgeon: Ivin Poot, MD;  Location: [place];  Service: Thoracic;  Laterality: N/A;  .  INSERT / REPLACE / REMOVE PACEMAKER     [employer]  . LEFT HEART CATH AND CORONARY ANGIOGRAPHY N/A 06/13/2018   Procedure: LEFT HEART CATH AND CORONARY ANGIOGRAPHY;  Surgeon: Wellington Hampshire, MD;  Location: [place] [facility];  Service: Cardiovascular;  Laterality: N/A;  . LEG SURGERY    . PACEMAKER IMPLANT N/A 06/11/2017   Procedure: Pacemaker Implant;  Surgeon: Evans Lance, MD;  Location: [place] [facility];  Service: Cardiovascular;  Laterality: N/A;  . TEE WITHOUT CARDIOVERSION N/A 08/26/2018   Procedure: TRANSESOPHAGEAL ECHOCARDIOGRAM (TEE);  Surgeon: Prescott Gum, Collier Salina, MD;  Location: [place];  Service: Open Heart Surgery;  Laterality: N/A;    Family History  Problem Relation Age of Onset  . Stroke Mother   . Stroke Father   . Alcohol abuse Father   . Diabetes Brother   . Alcohol abuse Brother   . Stroke Maternal Grandfather    Social History:  reports that he has never smoked. He has never used smokeless tobacco. He reports that he drinks alcohol. He reports that he does not use drugs.  Allergies:  Allergies  Allergen Reactions  . Azithromycin Hives  . Erythromycin Hives    Medications Prior to Admission  Medication Sig Dispense Refill  . albuterol (PROVENTIL HFA;VENTOLIN HFA) 108 (90 Base) MCG/ACT inhaler Inhale 1-2 puffs into the lungs every 4 (four) hours as needed for wheezing or shortness of breath.    . allopurinol (ZYLOPRIM) 100 MG tablet Take 100 mg by mouth 2 (two) times daily.    Marland Kitchen aspirin EC 81 MG tablet Take 81 mg by mouth daily.    Marland Kitchen atorvastatin (LIPITOR) 40 MG tablet Take 1 tablet (40 mg total) by mouth daily. 90 tablet 3  . Fluticasone-Salmeterol (ADVAIR) 100-50 MCG/DOSE AEPB Inhale 1 puff into the lungs daily as needed (shortness of breath). 60 each   . metoprolol succinate (TOPROL-XL) 50 MG 24 hr tablet Take 1 tablet (50 mg total) by mouth daily. 90 tablet 3  . Multiple Vitamin (MULTIVITAMIN) tablet Take 1 tablet by mouth daily.    . NON FORMULARY Take 1  tablet by mouth daily. otc prostate multivitamin    . traMADol (ULTRAM) 50 MG tablet Take 50 mg by mouth every 4-6 hours PRN severe pain 28 tablet 0  . warfarin (COUMADIN) 5 MG tablet Take 5 mg by mouth at bedtime.       Results for orders placed or performed during the hospital encounter of 09/22/18 (from the past 48 hour(s))  CBC with Differential     Status: Abnormal   Collection Time: 09/22/18 11:58 AM  Result Value Ref Range   WBC 8.8 4.0 - 10.5 K/uL   RBC 3.49 (L) 4.22 - 5.81  MIL/uL   Hemoglobin 11.0 (L) 13.0 - 17.0 g/dL   HCT 35.7 (L) 39.0 - 52.0 %   MCV 102.3 (H) 80.0 - 100.0 fL   MCH 31.5 26.0 - 34.0 pg   MCHC 30.8 30.0 - 36.0 g/dL   RDW 12.9 11.5 - 15.5 %   Platelets 269 150 - 400 K/uL   nRBC 0.0 0.0 - 0.2 %   Neutrophils Relative % 57 %   Neutro Abs 5.1 1.7 - 7.7 K/uL   Lymphocytes Relative 24 %   Lymphs Abs 2.1 0.7 - 4.0 K/uL   Monocytes Relative 8 %   Monocytes Absolute 0.7 0.1 - 1.0 K/uL   Eosinophils Relative 9 %   Eosinophils Absolute 0.8 (H) 0.0 - 0.5 K/uL   Basophils Relative 1 %   Basophils Absolute 0.1 0.0 - 0.1 K/uL   Immature Granulocytes 1 %   Abs Immature Granulocytes 0.05 0.00 - 0.07 K/uL    Comment: Performed at [facility], [address] [place], [place] [address]  Comprehensive metabolic panel     Status: Abnormal   Collection Time: 09/22/18 11:58 AM  Result Value Ref Range   Sodium 138 135 - 145 mmol/L   Potassium 4.9 3.5 - 5.1 mmol/L   Chloride 104 98 - 111 mmol/L   CO2 24 22 - 32 mmol/L   Glucose, Bld 236 (H) 70 - 99 mg/dL   BUN 25 (H) 8 - 23 mg/dL   Creatinine, Ser 1.62 (H) 0.61 - 1.24 mg/dL   Calcium 8.6 (L) 8.9 - 10.3 mg/dL   Total Protein 6.1 (L) 6.5 - 8.1 g/dL   Albumin 3.5 3.5 - 5.0 g/dL   AST 21 15 - 41 U/L   ALT 10 0 - 44 U/L   Alkaline Phosphatase 55 38 - 126 U/L   Total Bilirubin 0.9 0.3 - 1.2 mg/dL   GFR calc non Af Amer 40 (L) >60 mL/min   GFR calc Af Amer 46 (L) >60 mL/min    Comment: (NOTE) The eGFR has been  calculated using the CKD EPI equation. This calculation has not been validated in all clinical situations. eGFR's persistently <60 mL/min signify possible Chronic Kidney Disease.    Anion gap 10 5 - 15    Comment: Performed at [facility], [address] [place], [place] [address]  Troponin I     Status: None   Collection Time: 09/22/18 11:58 AM  Result Value Ref Range   Troponin I <0.03 <0.03 ng/mL    Comment: Performed at [facility], [address] [place], [place] [address]  Type and screen [place]     Status: None (Preliminary result)   Collection Time: 09/22/18  4:00 PM  Result Value Ref Range   ABO/RH(D) PENDING    Antibody Screen PENDING    Sample Expiration      09/25/2018 Performed at [facility], [address] [place], [place] [address]   Protime-INR     Status: Abnormal   Collection Time: 09/22/18  4:01 PM  Result Value Ref Range   Prothrombin Time 27.0 (H) 11.4 - 15.2 seconds   INR 2.54     Comment: Performed at [facility], [address] [place], [place] [address]  Type and screen Ordered by PROVIDER DEFAULT     Status: None (Preliminary result)   Collection Time: 09/22/18  4:34 PM  Result Value Ref Range   ABO/RH(D) PENDING    Antibody Screen PENDING  Sample Expiration      09/25/2018 Performed at [facility], [address] [place], [place] [address]   Type and screen Ordered by PROVIDER DEFAULT     Status: None (Preliminary result)   Collection Time: 09/22/18  5:11 PM  Result Value Ref Range   ABO/RH(D) PENDING    Antibody Screen PENDING    Sample Expiration      09/25/2018 Performed at [facility], [address] [place], [place] [address]    Dg Chest 2 View  Result Date: 09/22/2018 CLINICAL DATA:  Syncope. EXAM: CHEST - 2 VIEW COMPARISON:  Chest x-ray dated September 01, 2018. FINDINGS: Unchanged left chest wall pacemaker. Stable cardiomegaly status  post CABG. Increasing small left pleural effusion with worsening left lower lobe opacity. The right lung is clear. No pneumothorax. No acute osseous abnormality. IMPRESSION: 1. Increasing small left pleural effusion with worsening left lower lobe atelectasis. Electronically Signed   By: Titus Dubin M.D.   On: 09/22/2018 13:21   Ct Angio Chest/abd/pel For Dissection W And/or Wo Contrast  Result Date: 09/22/2018 CLINICAL DATA:  Had a near syncopal episode at home. When EMS arrived pt was diaphoretic, pale in color, in and out of consciousness per EMS. BP was 72/38 initially. EXAM: CT ANGIOGRAPHY CHEST, ABDOMEN AND PELVIS TECHNIQUE: Multidetector CT imaging through the chest, abdomen and pelvis was performed using the standard protocol during bolus administration of intravenous contrast. Multiplanar reconstructed images and MIPs were obtained and reviewed to evaluate the vascular anatomy. CONTRAST:  13m ISOVUE-370 IOPAMIDOL (ISOVUE-370) INJECTION 76% COMPARISON:  CT chest 06/26/2018, 05/29/2018 FINDINGS: CTA CHEST FINDINGS Cardiovascular: Satisfactory opacification of the pulmonary arteries to the segmental level. No evidence of pulmonary embolism. Normal heart size. No pericardial effusion. Thoracic aorta is normal in caliber. No thoracic aortic dissection. Ulcerated plaque along the left lateral margin of the aortic arch, but the wall of the aorta is normal with normal surrounding fat. Prior CABG. Origins of coronary grafts are normal. Graft extending to the circumflex artery distally has irregular appearance without active bleeding. Mediastinum/Nodes: No enlarged mediastinal, hilar, or axillary lymph nodes. Thyroid gland, trachea, and esophagus demonstrate no significant findings. Lungs/Pleura: Large left hemothorax. Subpleural hematoma along the left upper chest. Chronic lung is clear. No pneumothorax. Musculoskeletal: No acute osseous abnormality. No aggressive osseous lesion. Mild thoracic spine  spondylosis. Review of the MIP images confirms the above findings. CTA ABDOMEN AND PELVIS FINDINGS VASCULAR Aorta: Abdominal aortic aneurysm measuring 3.6 x 3.5 cm. No dissection, vasculitis or significant stenosis. Atherosclerotic disease. Celiac: Patent without evidence of aneurysm, dissection, vasculitis or significant stenosis. SMA: Patent without evidence of aneurysm, dissection, vasculitis or significant stenosis. Renals: Mild atherosclerotic plaque at the origin of bilateral renal arteries. No aneurysm, dissection, vasculitis or fibromuscular dysplasia. IMA: Patent without evidence of aneurysm, dissection, vasculitis or significant stenosis. Inflow: Patent without evidence of aneurysm, dissection, vasculitis or significant stenosis. Veins: No obvious venous abnormality within the limitations of this arterial phase study. Review of the MIP images confirms the above findings. NON-VASCULAR Hepatobiliary: No focal liver abnormality is seen. No gallstones, gallbladder wall thickening, or biliary dilatation. Pancreas: Unremarkable. No pancreatic ductal dilatation or surrounding inflammatory changes. Spleen: Normal in size without focal abnormality. Adrenals/Urinary Tract: Normal adrenal glands. Mild renal atrophy bilaterally. 3.3 cm left parapelvic cyst. No obstructive uropathy. Normal bladder. Stomach/Bowel: Stomach is within normal limits. Appendix appears normal. No evidence of bowel wall thickening, distention, or inflammatory changes. Large amount of stool in the rectum. Lymphatic: No lymphadenopathy.  Reproductive: Prostate is unremarkable.  Right hydrocele. Other: No abdominal wall hernia or abnormality. No abdominopelvic ascites. Musculoskeletal: No acute osseous abnormality. No aggressive osseous lesion. Degenerative disc disease with disc height loss at T12-L1, L1-2 and L2-3. Bilateral facet arthropathy throughout the lumbar spine. Review of the MIP images confirms the above findings. IMPRESSION: 1. Large  left hemothorax without active bleeding. No aortic dissection. Ulcerated plaque along the left lateral margin of the aortic arch, but the wall of the aorta is normal with normal surrounding fat. Prior CABG. Origins of coronary grafts are normal. Graft extending to the circumflex artery distally has irregular margins which may be postsurgical although there is no surrounding hematoma or active bleeding at this site. 2. No pulmonary embolus. 3. Abdominal aortic aneurysm measuring 3.6 x 3.5 cm. Abdominal Aortic Aneurysm (ICD10-I71.9). Recommend follow-up aortic ultrasound in 2 years. This recommendation follows ACR consensus guidelines: White Paper of the ACR Incidental Findings Committee II on Vascular Findings. J Am Coll Radiol 2013; 10:789-794. Critical Value/emergent results were called by telephone at the time of interpretation on 09/22/2018 at 3:39 pm to Dr. Nance Pear , who verbally acknowledged these results. Electronically Signed   By: Kathreen Devoid   On: 09/22/2018 15:40    Review of Systems  Constitutional: Positive for malaise/fatigue. Negative for chills, fever and weight loss.  HENT: Negative.   Eyes: Negative.   Respiratory: Positive for shortness of breath. Negative for cough and hemoptysis.   Cardiovascular: Negative for chest pain, palpitations, orthopnea, leg swelling and PND.  Gastrointestinal: Negative.   Genitourinary: Positive for urgency.  Musculoskeletal:       Some pain in the right lower back  Skin: Negative.   Neurological: Positive for dizziness. Negative for focal weakness, seizures, loss of consciousness and headaches.  Endo/Heme/Allergies: Negative.   Psychiatric/Behavioral: Negative.     There were no vitals taken for this visit. Physical Exam  Constitutional: He is oriented to person, place, and time. He appears well-developed and well-nourished. No distress.  Very talkative without becoming short of breath  HENT:  Head: Normocephalic and atraumatic.    Mouth/Throat: Oropharynx is clear and moist.  Eyes: Pupils are equal, round, and reactive to light. EOM are normal.  Neck: Normal range of motion. Neck supple. No JVD present.  Cardiovascular: Normal rate, regular rhythm, normal heart sounds and intact distal pulses.  No murmur heard. Respiratory: Effort normal. No respiratory distress.  Decreased breath sounds over the left hemithorax  GI: Soft. Bowel sounds are normal. He exhibits no distension. There is no tenderness.  Musculoskeletal: Normal range of motion. He exhibits no edema.  Lymphadenopathy:    He has no cervical adenopathy.  Neurological: He is alert and oriented to person, place, and time.  Skin: Skin is warm and dry.  Psychiatric: He has a normal mood and affect.    CLINICAL DATA:  Had a near syncopal episode at home. When EMS arrived pt was diaphoretic, pale in color, in and out of consciousness per EMS. BP was 72/38 initially.  EXAM: CT ANGIOGRAPHY CHEST, ABDOMEN AND PELVIS  TECHNIQUE: Multidetector CT imaging through the chest, abdomen and pelvis was performed using the standard protocol during bolus administration of intravenous contrast. Multiplanar reconstructed images and MIPs were obtained and reviewed to evaluate the vascular anatomy.  CONTRAST:  90m ISOVUE-370 IOPAMIDOL (ISOVUE-370) INJECTION 76%  COMPARISON:  CT chest 06/26/2018, 05/29/2018  FINDINGS: CTA CHEST FINDINGS  Cardiovascular: Satisfactory opacification of the pulmonary arteries to the segmental level. No evidence of pulmonary  embolism. Normal heart size. No pericardial effusion. Thoracic aorta is normal in caliber. No thoracic aortic dissection. Ulcerated plaque along the left lateral margin of the aortic arch, but the wall of the aorta is normal with normal surrounding fat. Prior CABG. Origins of coronary grafts are normal. Graft extending to the circumflex artery distally has irregular appearance without active  bleeding.  Mediastinum/Nodes: No enlarged mediastinal, hilar, or axillary lymph nodes. Thyroid gland, trachea, and esophagus demonstrate no significant findings.  Lungs/Pleura: Large left hemothorax. Subpleural hematoma along the left upper chest. Chronic lung is clear. No pneumothorax.  Musculoskeletal: No acute osseous abnormality. No aggressive osseous lesion. Mild thoracic spine spondylosis.  Review of the MIP images confirms the above findings.  CTA ABDOMEN AND PELVIS FINDINGS  VASCULAR  Aorta: Abdominal aortic aneurysm measuring 3.6 x 3.5 cm. No dissection, vasculitis or significant stenosis. Atherosclerotic disease.  Celiac: Patent without evidence of aneurysm, dissection, vasculitis or significant stenosis.  SMA: Patent without evidence of aneurysm, dissection, vasculitis or significant stenosis.  Renals: Mild atherosclerotic plaque at the origin of bilateral renal arteries. No aneurysm, dissection, vasculitis or fibromuscular dysplasia.  IMA: Patent without evidence of aneurysm, dissection, vasculitis or significant stenosis.  Inflow: Patent without evidence of aneurysm, dissection, vasculitis or significant stenosis.  Veins: No obvious venous abnormality within the limitations of this arterial phase study.  Review of the MIP images confirms the above findings.  NON-VASCULAR  Hepatobiliary: No focal liver abnormality is seen. No gallstones, gallbladder wall thickening, or biliary dilatation.  Pancreas: Unremarkable. No pancreatic ductal dilatation or surrounding inflammatory changes.  Spleen: Normal in size without focal abnormality.  Adrenals/Urinary Tract: Normal adrenal glands. Mild renal atrophy bilaterally. 3.3 cm left parapelvic cyst. No obstructive uropathy. Normal bladder.  Stomach/Bowel: Stomach is within normal limits. Appendix appears normal. No evidence of bowel wall thickening, distention, or inflammatory changes.  Large amount of stool in the rectum.  Lymphatic: No lymphadenopathy.  Reproductive: Prostate is unremarkable.  Right hydrocele.  Other: No abdominal wall hernia or abnormality. No abdominopelvic ascites.  Musculoskeletal: No acute osseous abnormality. No aggressive osseous lesion. Degenerative disc disease with disc height loss at T12-L1, L1-2 and L2-3. Bilateral facet arthropathy throughout the lumbar spine.  Review of the MIP images confirms the above findings.  IMPRESSION: 1. Large left hemothorax without active bleeding. No aortic dissection. Ulcerated plaque along the left lateral margin of the aortic arch, but the wall of the aorta is normal with normal surrounding fat. Prior CABG. Origins of coronary grafts are normal. Graft extending to the circumflex artery distally has irregular margins which may be postsurgical although there is no surrounding hematoma or active bleeding at this site. 2. No pulmonary embolus. 3. Abdominal aortic aneurysm measuring 3.6 x 3.5 cm. Abdominal Aortic Aneurysm (ICD10-I71.9). Recommend follow-up aortic ultrasound in 2 years. This recommendation follows ACR consensus guidelines: White Paper of the ACR Incidental Findings Committee II on Vascular Findings. J Am Coll Radiol 2013; 10:789-794.  Critical Value/emergent results were called by telephone at the time of interpretation on 09/22/2018 at 3:39 pm to Dr. Nance Pear , who verbally acknowledged these results.   Electronically Signed   By: Kathreen Devoid   On: 09/22/2018 15:40  Assessment/Plan  The patient is now almost 1 month following coronary bypass graft surgery and placement of a left ventricular epicardial pacing lead which was tunneled up to the left anterior chest wall.  He has been doing well and has been on Coumadin for a preoperative for left upper extremity DVT.  He now presents with some exertional shortness of breath and a episode of dizziness today after getting  up out of bed.  According to EMS his pressure was in the 70s when they arrived and responded promptly to fluid.  He has been hemodynamically stable ever since.  A CTA of the chest shows a new left hemothorax and a subpleural hematoma anteriorly beneath the area where the pacing lead tunnels through the left anterior chest wall.  His INR is 2.5.  I suspect that he has had a delayed bleed from the chest wall and is unclear when this occurred.  His hemoglobin today is unchanged from when it was checked on 09/17/2018.  This is a significant left pleural effusion and I think it will require drainage.  He is stable at this time.  I will discuss the case with Dr. Darcey Nora since he did his original surgery.  His INR is 2.5 today so we will give him 2 units of FFP to start reversal.  I will keep him n.p.o. after midnight tonight in case he requires a drainage procedure tomorrow.  Gaye Pollack, MD 09/22/2018, 7:47 PM

## 2018-09-22 NOTE — ED Notes (Addendum)
Pt given apple sauce, crackers, peanut butter. Ok per Dr. Archie Balboa.   Pt c/o back pain, lying on side currently. Informed Dr. Archie Balboa. See new orders.

## 2018-09-22 NOTE — ED Notes (Signed)
Xray called stating that pt had begun vomiting while attempting to obtain scan. Medication given in xray.

## 2018-09-22 NOTE — ED Notes (Signed)
Wife at bedside.

## 2018-09-22 NOTE — ED Notes (Signed)
Pt returned from CT via stretcher.

## 2018-09-22 NOTE — ED Notes (Signed)
Pt arrived without any clothes on. Only a depends, given warm blankets and a gown to wear.

## 2018-09-22 NOTE — ED Notes (Signed)
Pt taken to xray 

## 2018-09-22 NOTE — ED Notes (Signed)
Report called to [facility] °

## 2018-09-22 NOTE — ED Notes (Signed)
emtala reviewed by this RN 

## 2018-09-22 NOTE — ED Notes (Signed)
Pt assisted to toilet but unable to urinate. Placed back in bed.

## 2018-09-22 NOTE — ED Triage Notes (Addendum)
Pt arrives emergency traffic from home via [facility]. Pt arrives A&O, speaking in complete sentences. Had a near syncopal episode at home. When EMS arrived pt was diaphoretic, pale in color, in and out of consciousness per EMS. BP was 72/38 initially. Paced rhythm with EMS. Arrives 20 in [facility] Received fluids en route. Pt states diarrhea recently, hasn't been drinking much lately. Pt denies blood in stool. States he was up and walking around yesterday. Had open heart surgery at [facility] on Oct 1. Denies CP today.

## 2018-09-23 ENCOUNTER — Inpatient Hospital Stay (HOSPITAL_COMMUNITY): Payer: PPO

## 2018-09-23 LAB — BASIC METABOLIC PANEL
Anion gap: 5 (ref 5–15)
BUN: 32 mg/dL — ABNORMAL HIGH (ref 8–23)
CO2: 28 mmol/L (ref 22–32)
Calcium: 8.9 mg/dL (ref 8.9–10.3)
Chloride: 103 mmol/L (ref 98–111)
Creatinine, Ser: 1.88 mg/dL — ABNORMAL HIGH (ref 0.61–1.24)
GFR calc Af Amer: 38 mL/min — ABNORMAL LOW (ref >60)
GFR calc non Af Amer: 33 mL/min — ABNORMAL LOW (ref >60)
Glucose, Bld: 121 mg/dL — ABNORMAL HIGH (ref 70–99)
Potassium: 4.9 mmol/L (ref 3.5–5.1)
Sodium: 136 mmol/L (ref 135–145)

## 2018-09-23 LAB — URINALYSIS, ROUTINE W REFLEX MICROSCOPIC
Bilirubin Urine: NEGATIVE
Glucose, UA: NEGATIVE mg/dL
Hgb urine dipstick: NEGATIVE
Ketones, ur: NEGATIVE mg/dL
Leukocytes, UA: NEGATIVE
Nitrite: NEGATIVE
Protein, ur: NEGATIVE mg/dL
Specific Gravity, Urine: 1.016 (ref 1.005–1.030)
pH: 6 (ref 5.0–8.0)

## 2018-09-23 LAB — CBC
HCT: 28.8 % — ABNORMAL LOW (ref 39.0–52.0)
Hemoglobin: 8.7 g/dL — ABNORMAL LOW (ref 13.0–17.0)
MCH: 30.3 pg (ref 26.0–34.0)
MCHC: 30.2 g/dL (ref 30.0–36.0)
MCV: 100.3 fL — ABNORMAL HIGH (ref 80.0–100.0)
Platelets: 213 10*3/uL (ref 150–400)
RBC: 2.87 MIL/uL — ABNORMAL LOW (ref 4.22–5.81)
RDW: 13.2 % (ref 11.5–15.5)
WBC: 11.9 10*3/uL — ABNORMAL HIGH (ref 4.0–10.5)
nRBC: 0 % (ref 0.0–0.2)

## 2018-09-23 LAB — PREPARE FRESH FROZEN PLASMA: Unit division: 0

## 2018-09-23 LAB — BPAM FFP
Blood Product Expiration Date: 201911042359
Blood Product Expiration Date: 201911072359
ISSUE DATE / TIME: 201911032024
ISSUE DATE / TIME: 201911032141
Unit Type and Rh: 6200
Unit Type and Rh: 6200

## 2018-09-23 LAB — SURGICAL PCR SCREEN
MRSA, PCR: NEGATIVE
Staphylococcus aureus: NEGATIVE

## 2018-09-23 LAB — PROTIME-INR
INR: 1.85
Prothrombin Time: 21.1 seconds — ABNORMAL HIGH (ref 11.4–15.2)

## 2018-09-23 LAB — PREPARE RBC (CROSSMATCH)

## 2018-09-23 MED ORDER — CEFAZOLIN SODIUM-DEXTROSE 2-4 GM/100ML-% IV SOLN: 2.0000 g | INTRAVENOUS | Status: DC

## 2018-09-23 MED ORDER — SODIUM CHLORIDE 0.9 % IV SOLN
250.0000 mL | INTRAVENOUS | Status: DC | PRN
  Administered 2018-09-23: 1000 mL via INTRAVENOUS

## 2018-09-23 MED ORDER — CEFAZOLIN SODIUM-DEXTROSE 2-4 GM/100ML-% IV SOLN
2.0000 g | INTRAVENOUS | Status: DC
  Filled 2018-09-23: qty 100

## 2018-09-23 NOTE — Progress Notes (Signed)
Patient ID: Sean [name], male   DOB: 04-30-1941, 77 y.o.   MRN: [id number] EVENING ROUNDS NOTE :     [address]       ,[place] [address]             [phone]                   Procedure(s) (LRB): VIDEO ASSISTED THORACOSCOPY (Left) DRAINAGE OF LOCULATED PLEURAL EFFUSION (Left)  Total Length of Stay:  LOS: 1 day  BP 109/61   Pulse 80   Temp 98 F (36.7 C) (Oral)   Resp 16   SpO2 92%   .Intake/Output      11/03 0701 - 11/04 0700 11/04 0701 - 11/05 0700   P.O. 390    I.V.  204.8   Blood 500    Total Intake 890 204.8   Urine 50    Total Output 50    Net +840 +204.8        Urine Occurrence 200 x 2 x     . sodium chloride 1,000 mL (09/23/18 0951)  . [START ON 09/24/2018]  ceFAZolin (ANCEF) IV       Lab Results  Component Value Date   WBC 11.9 (H) 09/23/2018   HGB 8.7 (L) 09/23/2018   HCT 28.8 (L) 09/23/2018   PLT 213 09/23/2018   GLUCOSE 121 (H) 09/23/2018   CHOL 116 06/26/2018   TRIG 64 06/26/2018   HDL 43 06/26/2018   LDLCALC 60 06/26/2018   ALT 10 09/22/2018   AST 21 09/22/2018   NA 136 09/23/2018   K 4.9 09/23/2018   CL 103 09/23/2018   CREATININE 1.88 (H) 09/23/2018   BUN 32 (H) 09/23/2018   CO2 28 09/23/2018   TSH 3.141 06/26/2018   INR 1.85 09/23/2018   HGBA1C 6.0 (H) 08/22/2018   For left vats tomorrow, preop orders done    Grace Isaac MD  Beeper [phone] Office [phone] 09/23/2018 4:43 PM

## 2018-09-23 NOTE — Progress Notes (Signed)
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  Patient on chronic Coumadin therapy for Thromboembolic disease.  He developed a postop left pleural effusion with possible hemothorax although hemoglobin has been stable.  He had a orthostatic presyncopal episode and was readmitted to the hospital.  His INR his decreased to 1.6 after FFP transfusion.  His vital signs are stable.  Pulmonary status stable.  We will plan on left VATS in OR tomorrow to drain loculated left pleural effusion possible hemothorax.  Hold Coumadin. Objective: Vital signs in last 24 hours: Temp:  [97.3 F (36.3 C)-98 F (36.7 C)] 97.7 F (36.5 C) (11/04 0839) Pulse Rate:  [78-101] 82 (11/04 1001) Cardiac Rhythm: A-V Sequential paced (11/04 0800) Resp:  [10-23] 19 (11/04 1001) BP: (72-160)/(50-104) 102/64 (11/04 1000) SpO2:  [86 %-100 %] 98 % (11/04 1001) Weight:  [97.5 kg] 97.5 kg (11/03 1149)  Hemodynamic parameters for last 24 hours:    Intake/Output from previous day: 11/03 0701 - 11/04 0700 In: 890 [P.O.:390; Blood:500] Out: 50 [Urine:50] Intake/Output this shift: Total I/O In: 5.1 [I.V.:5.1] Out: -   Exam Patient up ambulating in room Slightly diminished breath sounds on left side Sternal incision clean and dry Left arm without significant swelling Neuro intact AV paced rhythm  Lab Results: Recent Labs    09/22/18 1158 09/23/18 0234  WBC 8.8 11.9*  HGB 11.0* 8.7*  HCT 35.7* 28.8*  PLT 269 213   BMET:  Recent Labs    09/22/18 1158 09/23/18 0234  NA 138 136  K 4.9 4.9  CL 104 103  CO2 24 28  GLUCOSE 236* 121*  BUN 25* 32*  CREATININE 1.62* 1.88*  CALCIUM 8.6* 8.9    PT/INR:  Recent Labs    09/23/18 0234  LABPROT 21.1*  INR 1.85   ABG    Component Value Date/Time   PHART 7.341 (L) 08/26/2018 2002   HCO3 23.3 08/26/2018 2002   TCO2 26 08/27/2018 1641   ACIDBASEDEF 3.0 (H) 08/26/2018 2002   O2SAT 81.5 08/29/2018 0527   CBG (last 3)  No results for input(s): GLUCAP in the last 72 hours.  Assessment/Plan: S/P Procedure(s) (LRB): VIDEO ASSISTED THORACOSCOPY (Left) DRAINAGE OF LOCULATED PLEURAL EFFUSION (Left) Loculated left pleural effusion with possible pneumothorax a component Presyncopal orthostatic episode probably related to dehydration Plan drainage of left loculated pleural effusion in OR tomorrow. Procedure discussed in detail with patient including the reason for surgery and the potential risks and benefits.   LOS: 1 day    Tharon Aquas Trigt III 09/23/2018

## 2018-09-23 NOTE — Progress Notes (Signed)
CARDIAC REHAB PHASE I   PRE:  Rate/Rhythm: 80 paced  BP:  Sitting: 126/74      SaO2: 97 RA  MODE:  Ambulation: 370 ft 84 peak HR  POST:  Rate/Rhythm: 80 paced  BP:  Sitting: 145/69    SaO2: 98 RA   Pt ambulated 38ft in hallway assist of one with one sitting rest break. Pt c/o some leg weakness. Pt with some noticeable SOB, maintained sats throughout the walk. Pt returned to bed, call bell within reach. Wife at bedside. Will continue to follow.  Rolling Fields, RN BSN 09/23/2018 2:18 PM

## 2018-09-24 ENCOUNTER — Inpatient Hospital Stay (HOSPITAL_COMMUNITY): Payer: PPO | Admitting: Certified Registered Nurse Anesthetist

## 2018-09-24 ENCOUNTER — Inpatient Hospital Stay (HOSPITAL_COMMUNITY): Payer: PPO

## 2018-09-24 ENCOUNTER — Encounter (HOSPITAL_COMMUNITY)
Admission: AD | Disposition: A | Discharge status: Home / Self Care | Payer: Self-pay | Source: Other Acute Inpatient Hospital | Attending: Cardiothoracic Surgery

## 2018-09-24 ENCOUNTER — Encounter (HOSPITAL_COMMUNITY): Payer: Self-pay | Admitting: Certified Registered"

## 2018-09-24 ENCOUNTER — Other Ambulatory Visit (HOSPITAL_COMMUNITY): Payer: PPO

## 2018-09-24 DIAGNOSIS — J90 Pleural effusion, not elsewhere classified: Secondary | ICD-10-CM | POA: Diagnosis present

## 2018-09-24 HISTORY — PX: VIDEO ASSISTED THORACOSCOPY: SHX5073

## 2018-09-24 HISTORY — PX: PLEURAL EFFUSION DRAINAGE: SHX5099

## 2018-09-24 LAB — COMPREHENSIVE METABOLIC PANEL
ALT: 17 U/L (ref 0–44)
AST: 54 U/L — ABNORMAL HIGH (ref 15–41)
Albumin: 3.4 g/dL — ABNORMAL LOW (ref 3.5–5.0)
Alkaline Phosphatase: 48 U/L (ref 38–126)
Anion gap: 5 (ref 5–15)
BUN: 33 mg/dL — ABNORMAL HIGH (ref 8–23)
CO2: 27 mmol/L (ref 22–32)
Calcium: 8.6 mg/dL — ABNORMAL LOW (ref 8.9–10.3)
Chloride: 105 mmol/L (ref 98–111)
Creatinine, Ser: 1.82 mg/dL — ABNORMAL HIGH (ref 0.61–1.24)
GFR calc Af Amer: 40 mL/min — ABNORMAL LOW (ref >60)
GFR calc non Af Amer: 34 mL/min — ABNORMAL LOW (ref >60)
Glucose, Bld: 100 mg/dL — ABNORMAL HIGH (ref 70–99)
Potassium: 4.8 mmol/L (ref 3.5–5.1)
Sodium: 137 mmol/L (ref 135–145)
Total Bilirubin: 0.6 mg/dL (ref 0.3–1.2)
Total Protein: 5.4 g/dL — ABNORMAL LOW (ref 6.5–8.1)

## 2018-09-24 LAB — CBC
HCT: 26.6 % — ABNORMAL LOW (ref 39.0–52.0)
Hemoglobin: 8.4 g/dL — ABNORMAL LOW (ref 13.0–17.0)
MCH: 31.6 pg (ref 26.0–34.0)
MCHC: 31.6 g/dL (ref 30.0–36.0)
MCV: 100 fL (ref 80.0–100.0)
Platelets: 167 10*3/uL (ref 150–400)
RBC: 2.66 MIL/uL — ABNORMAL LOW (ref 4.22–5.81)
RDW: 13.1 % (ref 11.5–15.5)
WBC: 10.9 10*3/uL — ABNORMAL HIGH (ref 4.0–10.5)
nRBC: 0 % (ref 0.0–0.2)

## 2018-09-24 LAB — GLUCOSE, CAPILLARY
Glucose-Capillary: 116 mg/dL — ABNORMAL HIGH (ref 70–99)
Glucose-Capillary: 133 mg/dL — ABNORMAL HIGH (ref 70–99)

## 2018-09-24 LAB — PROTIME-INR
INR: 1.94
Prothrombin Time: 21.9 seconds — ABNORMAL HIGH (ref 11.4–15.2)

## 2018-09-24 SURGERY — VIDEO ASSISTED THORACOSCOPY
Anesthesia: General | Site: Chest | Laterality: Left

## 2018-09-24 MED ORDER — ROCURONIUM BROMIDE 10 MG/ML (PF) SYRINGE
PREFILLED_SYRINGE | INTRAVENOUS | Status: DC | PRN
  Administered 2018-09-24: 20 mg via INTRAVENOUS
  Administered 2018-09-24: 50 mg via INTRAVENOUS
  Administered 2018-09-24: 20 mg via INTRAVENOUS

## 2018-09-24 MED ORDER — SODIUM CHLORIDE 0.9% FLUSH: 9.0000 mL | INTRAVENOUS | Status: DC | PRN

## 2018-09-24 MED ORDER — BUPIVACAINE HCL (PF) 0.5 % IJ SOLN
INTRAMUSCULAR | Status: AC
  Filled 2018-09-24: qty 10

## 2018-09-24 MED ORDER — LABETALOL HCL 5 MG/ML IV SOLN
10.0000 mg | INTRAVENOUS | Status: DC | PRN
  Administered 2018-09-24 – 2018-09-25 (×2): 10 mg via INTRAVENOUS
  Filled 2018-09-24 (×2): qty 4

## 2018-09-24 MED ORDER — SUGAMMADEX SODIUM 200 MG/2ML IV SOLN
INTRAVENOUS | Status: DC | PRN
  Administered 2018-09-24: 200 mg via INTRAVENOUS

## 2018-09-24 MED ORDER — DIPHENHYDRAMINE HCL 12.5 MG/5ML PO ELIX: 12.5000 mg | ORAL_SOLUTION | Freq: Four times a day (QID) | ORAL | Status: DC | PRN

## 2018-09-24 MED ORDER — BISACODYL 5 MG PO TBEC
10.0000 mg | DELAYED_RELEASE_TABLET | Freq: Every day | ORAL | Status: DC
  Administered 2018-09-25 – 2018-09-28 (×3): 10 mg via ORAL
  Filled 2018-09-24 (×3): qty 2

## 2018-09-24 MED ORDER — DEXAMETHASONE SODIUM PHOSPHATE 10 MG/ML IJ SOLN
INTRAMUSCULAR | Status: DC | PRN
  Administered 2018-09-24: 4 mg via INTRAVENOUS

## 2018-09-24 MED ORDER — FENTANYL CITRATE (PF) 100 MCG/2ML IJ SOLN
INTRAMUSCULAR | Status: AC
  Administered 2018-09-24: 50 ug via INTRAVENOUS
  Filled 2018-09-24: qty 2

## 2018-09-24 MED ORDER — FENTANYL 40 MCG/ML IV SOLN
INTRAVENOUS | Status: DC
  Administered 2018-09-24: 1000 ug via INTRAVENOUS
  Administered 2018-09-24: 130 ug via INTRAVENOUS
  Administered 2018-09-25: 70 ug via INTRAVENOUS
  Administered 2018-09-25: 40 ug via INTRAVENOUS
  Administered 2018-09-25: 70 ug via INTRAVENOUS
  Administered 2018-09-25: 1000 ug via INTRAVENOUS
  Administered 2018-09-26 (×2): 0 ug via INTRAVENOUS
  Filled 2018-09-24 (×2): qty 25

## 2018-09-24 MED ORDER — ACETAMINOPHEN 500 MG PO TABS
1000.0000 mg | ORAL_TABLET | Freq: Four times a day (QID) | ORAL | Status: DC
  Administered 2018-09-25 – 2018-09-28 (×14): 1000 mg via ORAL
  Filled 2018-09-24 (×15): qty 2

## 2018-09-24 MED ORDER — FENTANYL CITRATE (PF) 100 MCG/2ML IJ SOLN
25.0000 ug | INTRAMUSCULAR | Status: DC | PRN
  Administered 2018-09-24 (×2): 50 ug via INTRAVENOUS

## 2018-09-24 MED ORDER — ASPIRIN EC 81 MG PO TBEC
81.0000 mg | DELAYED_RELEASE_TABLET | Freq: Every day | ORAL | Status: DC
  Administered 2018-09-25 – 2018-09-28 (×4): 81 mg via ORAL
  Filled 2018-09-24 (×4): qty 1

## 2018-09-24 MED ORDER — PROPOFOL 10 MG/ML IV BOLUS
INTRAVENOUS | Status: AC
  Filled 2018-09-24: qty 40

## 2018-09-24 MED ORDER — BUPIVACAINE ON-Q PAIN PUMP (FOR ORDER SET NO CHG)
INJECTION | Status: AC
  Filled 2018-09-24: qty 1

## 2018-09-24 MED ORDER — ONDANSETRON HCL 4 MG/2ML IJ SOLN
INTRAMUSCULAR | Status: AC
  Filled 2018-09-24: qty 2

## 2018-09-24 MED ORDER — CEFAZOLIN SODIUM-DEXTROSE 2-4 GM/100ML-% IV SOLN
2.0000 g | Freq: Three times a day (TID) | INTRAVENOUS | Status: AC
  Administered 2018-09-24 – 2018-09-25 (×2): 2 g via INTRAVENOUS
  Filled 2018-09-24 (×2): qty 100

## 2018-09-24 MED ORDER — BUPIVACAINE HCL (PF) 0.5 % IJ SOLN
INTRAMUSCULAR | Status: DC | PRN
  Administered 2018-09-24: 10 mL

## 2018-09-24 MED ORDER — ONDANSETRON HCL 4 MG/2ML IJ SOLN: 4.0000 mg | Freq: Four times a day (QID) | INTRAMUSCULAR | Status: DC | PRN

## 2018-09-24 MED ORDER — LACTATED RINGERS IV SOLN
INTRAVENOUS | Status: DC
  Administered 2018-09-24: 10:00:00 via INTRAVENOUS

## 2018-09-24 MED ORDER — TRAMADOL HCL 50 MG PO TABS
50.0000 mg | ORAL_TABLET | Freq: Four times a day (QID) | ORAL | Status: DC | PRN
  Administered 2018-09-25: 50 mg via ORAL
  Filled 2018-09-24: qty 1

## 2018-09-24 MED ORDER — SENNOSIDES-DOCUSATE SODIUM 8.6-50 MG PO TABS
1.0000 | ORAL_TABLET | Freq: Every day | ORAL | Status: DC
  Administered 2018-09-25 – 2018-09-27 (×3): 1 via ORAL
  Filled 2018-09-24 (×2): qty 1

## 2018-09-24 MED ORDER — LACTATED RINGERS IV SOLN
INTRAVENOUS | Status: DC | PRN
  Administered 2018-09-24: 11:00:00 via INTRAVENOUS

## 2018-09-24 MED ORDER — SODIUM CHLORIDE 0.9 % IV SOLN
INTRAVENOUS | Status: DC | PRN
  Administered 2018-09-24: 30 ug/min via INTRAVENOUS

## 2018-09-24 MED ORDER — ACETAMINOPHEN 160 MG/5ML PO SOLN: 1000.0000 mg | Freq: Four times a day (QID) | ORAL | Status: DC

## 2018-09-24 MED ORDER — SODIUM CHLORIDE 0.9 % IV SOLN
INTRAVENOUS | Status: DC
  Administered 2018-09-24: 16:00:00 via INTRAVENOUS

## 2018-09-24 MED ORDER — FENTANYL CITRATE (PF) 250 MCG/5ML IJ SOLN
INTRAMUSCULAR | Status: AC
  Filled 2018-09-24: qty 5

## 2018-09-24 MED ORDER — FENTANYL CITRATE (PF) 250 MCG/5ML IJ SOLN
INTRAMUSCULAR | Status: DC | PRN
  Administered 2018-09-24: 50 ug via INTRAVENOUS
  Administered 2018-09-24: 100 ug via INTRAVENOUS
  Administered 2018-09-24: 25 ug via INTRAVENOUS
  Administered 2018-09-24: 50 ug via INTRAVENOUS
  Administered 2018-09-24: 25 ug via INTRAVENOUS

## 2018-09-24 MED ORDER — ROCURONIUM BROMIDE 50 MG/5ML IV SOSY
PREFILLED_SYRINGE | INTRAVENOUS | Status: AC
  Filled 2018-09-24: qty 10

## 2018-09-24 MED ORDER — NALOXONE HCL 0.4 MG/ML IJ SOLN: 0.4000 mg | INTRAMUSCULAR | Status: DC | PRN

## 2018-09-24 MED ORDER — METOPROLOL SUCCINATE ER 50 MG PO TB24
50.0000 mg | ORAL_TABLET | Freq: Every day | ORAL | Status: DC
  Administered 2018-09-25 – 2018-09-28 (×4): 50 mg via ORAL
  Filled 2018-09-24 (×4): qty 1

## 2018-09-24 MED ORDER — DIPHENHYDRAMINE HCL 50 MG/ML IJ SOLN: 12.5000 mg | Freq: Four times a day (QID) | INTRAMUSCULAR | Status: DC | PRN

## 2018-09-24 MED ORDER — ETOMIDATE 2 MG/ML IV SOLN
INTRAVENOUS | Status: DC | PRN
  Administered 2018-09-24: 20 mg via INTRAVENOUS

## 2018-09-24 MED ORDER — HYDRALAZINE HCL 20 MG/ML IJ SOLN
INTRAMUSCULAR | Status: AC
  Filled 2018-09-24: qty 1

## 2018-09-24 MED ORDER — OXYCODONE HCL 5 MG PO TABS: 5.0000 mg | ORAL_TABLET | ORAL | Status: DC | PRN

## 2018-09-24 MED ORDER — DEXAMETHASONE SODIUM PHOSPHATE 10 MG/ML IJ SOLN
INTRAMUSCULAR | Status: AC
  Filled 2018-09-24: qty 1

## 2018-09-24 MED ORDER — HEMOSTATIC AGENTS (NO CHARGE) OPTIME
TOPICAL | Status: DC | PRN
  Administered 2018-09-24: 1 via TOPICAL

## 2018-09-24 MED ORDER — HYDRALAZINE HCL 20 MG/ML IJ SOLN
10.0000 mg | INTRAMUSCULAR | Status: DC | PRN
  Administered 2018-09-24 – 2018-09-25 (×2): 10 mg via INTRAVENOUS
  Filled 2018-09-24 (×2): qty 1

## 2018-09-24 MED ORDER — 0.9 % SODIUM CHLORIDE (POUR BTL) OPTIME
TOPICAL | Status: DC | PRN
  Administered 2018-09-24 (×2): 1000 mL

## 2018-09-24 MED ORDER — MIDAZOLAM HCL 2 MG/2ML IJ SOLN
INTRAMUSCULAR | Status: AC
  Filled 2018-09-24: qty 2

## 2018-09-24 MED ORDER — ALBUMIN HUMAN 5 % IV SOLN
INTRAVENOUS | Status: DC | PRN
  Administered 2018-09-24: 13:00:00 via INTRAVENOUS

## 2018-09-24 MED ORDER — INSULIN ASPART 100 UNIT/ML ~~LOC~~ SOLN
0.0000 [IU] | Freq: Four times a day (QID) | SUBCUTANEOUS | Status: DC
  Administered 2018-09-25 (×2): 2 [IU] via SUBCUTANEOUS

## 2018-09-24 MED ORDER — ONDANSETRON HCL 4 MG/2ML IJ SOLN: 4.0000 mg | Freq: Once | INTRAMUSCULAR | Status: DC | PRN

## 2018-09-24 MED ORDER — ONDANSETRON HCL 4 MG/2ML IJ SOLN
INTRAMUSCULAR | Status: DC | PRN
  Administered 2018-09-24: 4 mg via INTRAVENOUS

## 2018-09-24 MED ORDER — POTASSIUM CHLORIDE 10 MEQ/50ML IV SOLN
10.0000 meq | Freq: Every day | INTRAVENOUS | Status: DC | PRN
  Filled 2018-09-24: qty 50

## 2018-09-24 MED ORDER — BUPIVACAINE 0.5 % ON-Q PUMP SINGLE CATH 400 ML
400.0000 mL | INJECTION | Status: AC
  Administered 2018-09-24: 400 mL
  Filled 2018-09-24: qty 400

## 2018-09-24 MED ORDER — LIDOCAINE 2% (20 MG/ML) 5 ML SYRINGE
INTRAMUSCULAR | Status: DC | PRN
  Administered 2018-09-24: 100 mg via INTRAVENOUS

## 2018-09-24 MED ORDER — FENTANYL CITRATE (PF) 100 MCG/2ML IJ SOLN
INTRAMUSCULAR | Status: AC
  Filled 2018-09-24: qty 2

## 2018-09-24 MED ORDER — FENTANYL CITRATE (PF) 100 MCG/2ML IJ SOLN
50.0000 ug | Freq: Once | INTRAMUSCULAR | Status: AC
  Administered 2018-09-24: 50 ug via INTRAVENOUS

## 2018-09-24 SURGICAL SUPPLY — 72 items
ADH SKN CLS APL DERMABOND .7 (GAUZE/BANDAGES/DRESSINGS)
ADH SKN CLS LQ APL DERMABOND (GAUZE/BANDAGES/DRESSINGS) ×2
APL SRG 22X2 LUM MLBL SLNT (VASCULAR PRODUCTS) ×4
APPLICATOR TIP EXT COSEAL (VASCULAR PRODUCTS) ×4 IMPLANT
BAG DECANTER FOR FLEXI CONT (MISCELLANEOUS) IMPLANT
BLADE SURG 11 STRL SS (BLADE) ×4 IMPLANT
CANISTER SUCT 3000ML PPV (MISCELLANEOUS) ×4 IMPLANT
CATH KIT ON Q 5IN SLV (PAIN MANAGEMENT) ×2 IMPLANT
CATH ROBINSON RED A/P 22FR (CATHETERS) IMPLANT
CATH THORACIC 28FR (CATHETERS) IMPLANT
CATH THORACIC 28FR RT ANG (CATHETERS) ×2 IMPLANT
CATH THORACIC 36FR (CATHETERS) IMPLANT
CATH THORACIC 36FR RT ANG (CATHETERS) IMPLANT
CONN ST 1/4X3/8  BEN (MISCELLANEOUS) ×2
CONN ST 1/4X3/8 BEN (MISCELLANEOUS) IMPLANT
CONT SPEC 4OZ CLIKSEAL STRL BL (MISCELLANEOUS) ×8 IMPLANT
COVER SURGICAL LIGHT HANDLE (MISCELLANEOUS) ×8 IMPLANT
COVER WAND RF STERILE (DRAPES) ×4 IMPLANT
DERMABOND ADHESIVE PROPEN (GAUZE/BANDAGES/DRESSINGS) ×2
DERMABOND ADVANCED (GAUZE/BANDAGES/DRESSINGS)
DERMABOND ADVANCED .7 DNX12 (GAUZE/BANDAGES/DRESSINGS) IMPLANT
DERMABOND ADVANCED .7 DNX6 (GAUZE/BANDAGES/DRESSINGS) IMPLANT
DRAIN CHANNEL 32F RND 10.7 FF (WOUND CARE) ×2 IMPLANT
DRAPE LAPAROSCOPIC ABDOMINAL (DRAPES) ×4 IMPLANT
DRAPE SLUSH/WARMER DISC (DRAPES) ×4 IMPLANT
ELECT BLADE 4.0 EZ CLEAN MEGAD (MISCELLANEOUS) ×4
ELECT BLADE 6.5 EXT (BLADE) ×2 IMPLANT
ELECT REM PT RETURN 9FT ADLT (ELECTROSURGICAL) ×4
ELECTRODE BLDE 4.0 EZ CLN MEGD (MISCELLANEOUS) IMPLANT
ELECTRODE REM PT RTRN 9FT ADLT (ELECTROSURGICAL) ×2 IMPLANT
GAUZE SPONGE 4X4 12PLY STRL (GAUZE/BANDAGES/DRESSINGS) ×4 IMPLANT
GLOVE BIO SURGEON STRL SZ7.5 (GLOVE) ×8 IMPLANT
GOWN STRL REUS W/ TWL LRG LVL3 (GOWN DISPOSABLE) ×6 IMPLANT
GOWN STRL REUS W/TWL LRG LVL3 (GOWN DISPOSABLE) ×12
KIT BASIN OR (CUSTOM PROCEDURE TRAY) ×4 IMPLANT
KIT SUCTION CATH 14FR (SUCTIONS) ×4 IMPLANT
KIT TURNOVER KIT B (KITS) ×4 IMPLANT
NS IRRIG 1000ML POUR BTL (IV SOLUTION) ×8 IMPLANT
PACK CHEST (CUSTOM PROCEDURE TRAY) ×4 IMPLANT
PAD ARMBOARD 7.5X6 YLW CONV (MISCELLANEOUS) ×8 IMPLANT
SEALANT SURG COSEAL 4ML (VASCULAR PRODUCTS) IMPLANT
SOLUTION ANTI FOG 6CC (MISCELLANEOUS) ×4 IMPLANT
SPONGE TONSIL TAPE 1 RFD (DISPOSABLE) ×6 IMPLANT
SUT CHROMIC 3 0 SH 27 (SUTURE) IMPLANT
SUT ETHILON 3 0 PS 1 (SUTURE) IMPLANT
SUT PROLENE 3 0 SH DA (SUTURE) IMPLANT
SUT PROLENE 4 0 RB 1 (SUTURE)
SUT PROLENE 4-0 RB1 .5 CRCL 36 (SUTURE) IMPLANT
SUT SILK  1 MH (SUTURE) ×4
SUT SILK 1 MH (SUTURE) ×4 IMPLANT
SUT SILK 2 0SH CR/8 30 (SUTURE) ×2 IMPLANT
SUT SILK 3 0SH CR/8 30 (SUTURE) IMPLANT
SUT VIC AB 1 CTX 18 (SUTURE) ×2 IMPLANT
SUT VIC AB 2 TP1 27 (SUTURE) IMPLANT
SUT VIC AB 2-0 CT2 18 VCP726D (SUTURE) IMPLANT
SUT VIC AB 2-0 CTX 36 (SUTURE) ×2 IMPLANT
SUT VIC AB 3-0 SH 18 (SUTURE) IMPLANT
SUT VIC AB 3-0 X1 27 (SUTURE) ×2 IMPLANT
SUT VICRYL 0 UR6 27IN ABS (SUTURE) IMPLANT
SUT VICRYL 2 TP 1 (SUTURE) ×2 IMPLANT
SWAB COLLECTION DEVICE MRSA (MISCELLANEOUS) ×2 IMPLANT
SWAB CULTURE ESWAB REG 1ML (MISCELLANEOUS) IMPLANT
SYSTEM SAHARA CHEST DRAIN ATS (WOUND CARE) ×4 IMPLANT
TAPE CLOTH SURG 4X10 WHT LF (GAUZE/BANDAGES/DRESSINGS) ×2 IMPLANT
TIP APPLICATOR SPRAY EXTEND 16 (VASCULAR PRODUCTS) IMPLANT
TOWEL GREEN STERILE (TOWEL DISPOSABLE) ×4 IMPLANT
TOWEL GREEN STERILE FF (TOWEL DISPOSABLE) ×4 IMPLANT
TRAP SPECIMEN MUCOUS 40CC (MISCELLANEOUS) ×2 IMPLANT
TRAY FOLEY MTR SLVR 16FR STAT (SET/KITS/TRAYS/PACK) ×4 IMPLANT
TROCAR BLADELESS 5MM (ENDOMECHANICALS) IMPLANT
TUNNELER SHEATH ON-Q 11GX8 DSP (PAIN MANAGEMENT) ×2 IMPLANT
WATER STERILE IRR 1000ML POUR (IV SOLUTION) ×8 IMPLANT

## 2018-09-24 NOTE — Anesthesia Procedure Notes (Signed)
Arterial Line Insertion Start/End11/03/2018 10:12 AM, 09/24/2018 10:16 AM Performed by: Glynda Jaeger, CRNA, CRNA  Preanesthetic checklist: patient identified, IV checked, site marked, risks and benefits discussed, surgical consent, monitors and equipment checked, pre-op evaluation, timeout performed and anesthesia consent Left, radial was placed Catheter size: 20 G Hand hygiene performed  and maximum sterile barriers used   Attempts: 1 Procedure performed without using ultrasound guided technique. Following insertion, dressing applied and Biopatch. Post procedure assessment: normal  Patient tolerated the procedure well with no immediate complications.

## 2018-09-24 NOTE — Anesthesia Preprocedure Evaluation (Signed)
Anesthesia Evaluation  Patient identified by MRN, date of birth, ID band Patient awake    Reviewed: Allergy & Precautions, NPO status , Patient's Chart, lab work & pertinent test results, reviewed documented beta blocker date and time   Airway Mallampati: II  TM Distance: >3 FB Neck ROM: Full    Dental  (+) Teeth Intact, Dental Advisory Given   Pulmonary asthma ,    Pulmonary exam normal breath sounds clear to auscultation       Cardiovascular hypertension, Pt. on home beta blockers (-) angina+ CAD, + Past MI, + CABG and +CHF  Normal cardiovascular exam+ dysrhythmias + pacemaker  Rhythm:Regular Rate:Normal     Neuro/Psych negative neurological ROS     GI/Hepatic Neg liver ROS, GERD  Medicated,  Endo/Other  negative endocrine ROSObesity   Renal/GU Renal InsufficiencyRenal disease     Musculoskeletal negative musculoskeletal ROS (+)   Abdominal   Peds  Hematology  (+) Blood dyscrasia (Warfarin), ,   Anesthesia Other Findings Day of surgery medications reviewed with the patient.  Reproductive/Obstetrics                             Anesthesia Physical Anesthesia Plan  ASA: IV  Anesthesia Plan: General   Post-op Pain Management:    Induction: Intravenous  PONV Risk Score and Plan: 2 and Dexamethasone and Ondansetron  Airway Management Planned: Double Lumen EBT  Additional Equipment: Arterial line, CVP and Ultrasound Guidance Line Placement  Intra-op Plan:   Post-operative Plan: Extubation in OR and Possible Post-op intubation/ventilation  Informed Consent: I have reviewed the patients History and Physical, chart, labs and discussed the procedure including the risks, benefits and alternatives for the proposed anesthesia with the patient or authorized representative who has indicated his/her understanding and acceptance.   Dental advisory given  Plan Discussed with:  CRNA  Anesthesia Plan Comments:         Anesthesia Quick Evaluation

## 2018-09-24 NOTE — Plan of Care (Signed)
  Problem: Education: Goal: Knowledge of General Education information will improve Description: Including pain rating scale, medication(s)/side effects and non-pharmacologic comfort measures Outcome: Progressing   Problem: Activity: Goal: Risk for activity intolerance will decrease Outcome: Progressing   Problem: Pain Managment: Goal: General experience of comfort will improve Outcome: Progressing   

## 2018-09-24 NOTE — Progress Notes (Signed)
Pre Procedure note for inpatients:   Sean [name] has been scheduled for Procedure(s): VIDEO ASSISTED THORACOSCOPY (Left) DRAINAGE OF LOCULATED PLEURAL EFFUSION (Left) today. The various methods of treatment have been discussed with the patient. After consideration of the risks, benefits and treatment options the patient has consented to the planned procedure.   The patient has been seen and labs reviewed. There are no changes in the patient's condition to prevent proceeding with the planned procedure today.  Recent labs:  Lab Results  Component Value Date   WBC 10.9 (H) 09/24/2018   HGB 8.4 (L) 09/24/2018   HCT 26.6 (L) 09/24/2018   PLT 167 09/24/2018   GLUCOSE 100 (H) 09/24/2018   CHOL 116 06/26/2018   TRIG 64 06/26/2018   HDL 43 06/26/2018   LDLCALC 60 06/26/2018   ALT 17 09/24/2018   AST 54 (H) 09/24/2018   NA 137 09/24/2018   K 4.8 09/24/2018   CL 105 09/24/2018   CREATININE 1.82 (H) 09/24/2018   BUN 33 (H) 09/24/2018   CO2 27 09/24/2018   TSH 3.141 06/26/2018   INR 1.94 09/24/2018   HGBA1C 6.0 (H) 08/22/2018    Len Childs, MD 09/24/2018 7:46 AM

## 2018-09-24 NOTE — Progress Notes (Signed)
Patient ID: Sean [name], male   DOB: 1941/05/11, 77 y.o.   MRN: [id number]  TCTS Evening Rounds:   Hemodynamically stable   Urine output good  CT output low  A/P:  Stable postop course. Continue current plans

## 2018-09-24 NOTE — Anesthesia Procedure Notes (Signed)
Central Venous Catheter Insertion Performed by: Catalina Gravel, MD, anesthesiologist Start/End11/03/2018 10:20 AM, 09/24/2018 10:30 AM Patient location: Pre-op. Preanesthetic checklist: patient identified, IV checked, site marked, risks and benefits discussed, surgical consent, monitors and equipment checked, pre-op evaluation, timeout performed and anesthesia consent Position: Trendelenburg Lidocaine 1% used for infiltration and patient sedated Hand hygiene performed , maximum sterile barriers used  and Seldinger technique used Catheter size: 9 Fr Central line was placed.Double lumen Procedure performed using ultrasound guided technique. Ultrasound Notes:anatomy identified, needle tip was noted to be adjacent to the nerve/plexus identified, no ultrasound evidence of intravascular and/or intraneural injection and image(s) printed for medical record Attempts: 1 Following insertion, line sutured, dressing applied and Biopatch. Post procedure assessment: blood return through all ports, free fluid flow and no air  Patient tolerated the procedure well with no immediate complications.

## 2018-09-24 NOTE — Anesthesia Procedure Notes (Addendum)
Procedure Name: Intubation Date/Time: 09/24/2018 11:46 AM Performed by: Imagene Riches, CRNA Pre-anesthesia Checklist: Patient identified, Emergency Drugs available, Suction available and Patient being monitored Patient Re-evaluated:Patient Re-evaluated prior to induction Oxygen Delivery Method: Circle System Utilized Preoxygenation: Pre-oxygenation with 100% oxygen Induction Type: IV induction Ventilation: Mask ventilation without difficulty and Oral airway inserted - appropriate to patient size Laryngoscope Size: Mac and 4 Grade View: Grade I Tube type: Oral Endobronchial tube: Double lumen EBT and 37 Fr Number of attempts: 2 Airway Equipment and Method: Stylet and Oral airway Placement Confirmation: ETT inserted through vocal cords under direct vision,  positive ETCO2 and breath sounds checked- equal and bilateral Secured at: 29 cm Tube secured with: Tape Dental Injury: Teeth and Oropharynx as per pre-operative assessment  Comments: Performed by Norm Salt, SRNA. Unable to advance 7F through vocal cords

## 2018-09-24 NOTE — Brief Op Note (Addendum)
09/22/2018 - 09/24/2018  1:31 PM  PATIENT:  Dondra Spry  77 y.o. male  PRE-OPERATIVE DIAGNOSIS:  LARGE LEFT HEMATOMA  POST-OPERATIVE DIAGNOSIS:  LARGE LEFT HEMATOMA  PROCEDURE:  LEFT VIDEO ASSISTED THORACOSCOPY, DRAINAGE OF LOCULATED LEFT PLEURAL EFFUSION, and PLACEMENT of On Q   FINDINGS: Approximately 1800 cc of bloody left pleural fluid removed with copius clots  SURGEON:  Surgeon(s) and Role:    Ivin Poot, MD - Primary  PHYSICIAN ASSISTANT: Lars Pinks PA-C   ANESTHESIA:   general  EBL:  150 mL   BLOOD ADMINISTERED:none  DRAINS: May and chest tube placed in the left pleural space   LOCAL MEDICATIONS USED:  BUPIVICAINE   SPECIMEN:  Source of Specimen:  Left bloody pleural fluid  DISPOSITION OF SPECIMEN:  Culture, AFB, fungal  COUNTS CORRECT:  YES  DICTATION: .Dragon Dictation  PLAN OF CARE: Admit to inpatient   PATIENT DISPOSITION:  PACU - hemodynamically stable.   Delay start of Pharmacological VTE agent (>24hrs) due to surgical blood loss or risk of bleeding: yes

## 2018-09-24 NOTE — Op Note (Signed)
NAME: Sean [name], Sean [name] MEDICAL RECORD [id number] ACCOUNT [id number] DATE OF BIRTH:08-14-1941 FACILITY: [facility] LOCATION: [facility] PHYSICIAN:Lorna Strother VAN TRIGT III, MD  OPERATIVE REPORT  DATE OF PROCEDURE:  09/24/2018  OPERATION:  Left VATS (video-assisted thoracoscopic surgery) for drainage of left hemothorax and decortication of left upper lobe.  SURGEON:  Tharon Aquas Trigt III, MD  ASSISTANT:  Lars Pinks, PA-C  PREOPERATIVE DIAGNOSES:  History of coronary artery bypass graft 4 weeks previously with readmission for left hematoma associated with a prolonged INR.  POSTOPERATIVE DIAGNOSIS:  History of coronary artery bypass graft 4 weeks previously with readmission for left hematoma associated with a prolonged INR.  ANESTHESIA:  General.  CLINICAL NOTE:  The patient is a 77 year old gentleman on chronic Coumadin therapy for thromboembolic disease, venous.  He underwent successful coronary bypass grafting the previous month and was at home recovering when he had a presyncopal episode.  He  was evaluated in the ED at an outside hospital and found to have a normal EKG but a left pleural effusion.  Further CT scan showed this to be probable hemothorax.  His INR on his chronic Coumadin therapy was elevated.  He was transferred to this hospital  where the patient received FFP in preparation for VATS and drainage of the left pleural effusion, hemothorax.  I discussed the procedure in detail with the patient prior to surgery including the expected benefits, the location of the incision, the  expected recovery and the potential risks of infection, bleeding, pneumonia, organ failure, death.  He agreed to proceed.  DESCRIPTION OF PROCEDURE:  The patient was brought from preop holding where informed consent was documented and the proper site was marked.  The patient was brought to the operating room and placed supine on the operating table.  General anesthesia was  induced with a double-lumen  endotracheal tube.  The patient remained stable.  The left chest was turned up, and the left chest was prepped and draped as a sterile field.  A proper time-out was performed.  A small incision was made in the 4th interspace  anterior to the scapula and the camera was inserted.  There was loculated hemothorax and organized clot on the surface of the left upper lobe.  A small incision was made.  Through the small working incision, a liter of bloody fluid was drained, and this  was sent for culture.  Organized thrombus was dissected off the visceral pleura of the left upper lobe.  The inside of the thorax was examined.  There was no evidence of bleeding from the mammary artery bed.  There was bleeding in the region of a  pacemaker lead that had been placed on the LV because of the patient's ischemic cardiomyopathy and passed through the thorax into a pacemaker pocket in the left subclavicular space.  This area of bleeding was probable intercostal muscle with some ooze  and blood dripping down.  This was controlled with direct cautery.  The hemithorax was irrigated.  Two chest tubes were placed anteriorly and posteriorly, and the lung was reexpanded under direct vision.  The ribs were reapproximated with a single pericostal suture and then the incision closed in layers using interrupted  Vicryl for the muscle and running Vicryl for subcutaneous and skin.  An On-Q pain irrigation catheter was placed, the chest tubes were connected to a Pleur-Evac drainage system, and the patient was turned supine, extubated, and returned to recovery  room.  He remained stable.  LN/NUANCE  D:09/24/2018 T:09/24/2018 JOB:[id number]

## 2018-09-24 NOTE — Transfer of Care (Signed)
Immediate Anesthesia Transfer of Care Note  Patient: Sean [name]  Procedure(s) Performed: VIDEO ASSISTED THORACOSCOPY (Left Chest) DRAINAGE OF LOCULATED PLEURAL EFFUSION (Left )  Patient Location: PACU  Anesthesia Type:General  Level of Consciousness: awake and alert   Airway & Oxygen Therapy: Patient Spontanous Breathing and Patient connected to face mask oxygen  Post-op Assessment: Report given to RN and Post -op Vital signs reviewed and stable  Post vital signs: Reviewed and stable  Last Vitals:  Vitals Value Taken Time  BP 108/81 09/24/2018  1:52 PM  Temp    Pulse 81 09/24/2018  2:00 PM  Resp 19 09/24/2018  2:01 PM  SpO2 95 % 09/24/2018  2:00 PM  Vitals shown include unvalidated device data.  Last Pain:  Vitals:   09/24/18 0735  TempSrc: Oral  PainSc:          Complications: No apparent anesthesia complications

## 2018-09-25 ENCOUNTER — Encounter (HOSPITAL_COMMUNITY): Payer: Self-pay | Admitting: Cardiothoracic Surgery

## 2018-09-25 ENCOUNTER — Inpatient Hospital Stay (HOSPITAL_COMMUNITY): Payer: PPO

## 2018-09-25 LAB — CBC
HCT: 25.6 % — ABNORMAL LOW (ref 39.0–52.0)
Hemoglobin: 7.8 g/dL — ABNORMAL LOW (ref 13.0–17.0)
MCH: 31 pg (ref 26.0–34.0)
MCHC: 30.5 g/dL (ref 30.0–36.0)
MCV: 101.6 fL — ABNORMAL HIGH (ref 80.0–100.0)
Platelets: 173 10*3/uL (ref 150–400)
RBC: 2.52 MIL/uL — ABNORMAL LOW (ref 4.22–5.81)
RDW: 13.2 % (ref 11.5–15.5)
WBC: 11.4 10*3/uL — ABNORMAL HIGH (ref 4.0–10.5)
nRBC: 0 % (ref 0.0–0.2)

## 2018-09-25 LAB — BASIC METABOLIC PANEL
Anion gap: 4 — ABNORMAL LOW (ref 5–15)
BUN: 23 mg/dL (ref 8–23)
CO2: 29 mmol/L (ref 22–32)
Calcium: 8.5 mg/dL — ABNORMAL LOW (ref 8.9–10.3)
Chloride: 106 mmol/L (ref 98–111)
Creatinine, Ser: 1.36 mg/dL — ABNORMAL HIGH (ref 0.61–1.24)
GFR calc Af Amer: 57 mL/min — ABNORMAL LOW (ref >60)
GFR calc non Af Amer: 49 mL/min — ABNORMAL LOW (ref >60)
Glucose, Bld: 117 mg/dL — ABNORMAL HIGH (ref 70–99)
Potassium: 4.7 mmol/L (ref 3.5–5.1)
Sodium: 139 mmol/L (ref 135–145)

## 2018-09-25 LAB — BLOOD GAS, ARTERIAL
Acid-Base Excess: 2.2 mmol/L — ABNORMAL HIGH (ref 0.0–2.0)
Bicarbonate: 26.8 mmol/L (ref 20.0–28.0)
FIO2: 0.21
O2 Saturation: 92.4 %
Patient temperature: 98.6
pCO2 arterial: 45.7 mmHg (ref 32.0–48.0)
pH, Arterial: 7.385 (ref 7.350–7.450)
pO2, Arterial: 65 mmHg — ABNORMAL LOW (ref 83.0–108.0)

## 2018-09-25 LAB — ACID FAST SMEAR (AFB, MYCOBACTERIA): Acid Fast Smear: NEGATIVE

## 2018-09-25 LAB — GLUCOSE, CAPILLARY
Glucose-Capillary: 106 mg/dL — ABNORMAL HIGH (ref 70–99)
Glucose-Capillary: 118 mg/dL — ABNORMAL HIGH (ref 70–99)
Glucose-Capillary: 136 mg/dL — ABNORMAL HIGH (ref 70–99)

## 2018-09-25 MED ORDER — ALBUMIN HUMAN 25 % IV SOLN
12.5000 g | Freq: Four times a day (QID) | INTRAVENOUS | Status: AC
  Administered 2018-09-25 – 2018-09-26 (×3): 12.5 g via INTRAVENOUS
  Filled 2018-09-25 (×3): qty 50

## 2018-09-25 MED ORDER — PHENOL 1.4 % MT LIQD
1.0000 | OROMUCOSAL | Status: DC | PRN
  Filled 2018-09-25: qty 177

## 2018-09-25 MED ORDER — FE FUMARATE-B12-VIT C-FA-IFC PO CAPS
1.0000 | ORAL_CAPSULE | Freq: Two times a day (BID) | ORAL | Status: DC
  Administered 2018-09-25 – 2018-09-28 (×7): 1 via ORAL
  Filled 2018-09-25 (×7): qty 1

## 2018-09-25 NOTE — Progress Notes (Signed)
120 mcg fentanyl pca wasted with Ena Dawley, RN.  Joellen Jersey, RN

## 2018-09-25 NOTE — Anesthesia Postprocedure Evaluation (Signed)
Anesthesia Post Note  Patient: ZAYDRIAN BATTA  Procedure(s) Performed: VIDEO ASSISTED THORACOSCOPY (Left Chest) DRAINAGE OF LOCULATED PLEURAL EFFUSION (Left )     Patient location during evaluation: PACU Anesthesia Type: General Level of consciousness: awake and alert, awake and oriented Pain management: pain level controlled Vital Signs Assessment: post-procedure vital signs reviewed and stable Respiratory status: spontaneous breathing, nonlabored ventilation and respiratory function stable Cardiovascular status: blood pressure returned to baseline and stable Postop Assessment: no apparent nausea or vomiting Anesthetic complications: no    Last Vitals:  Vitals:   09/25/18 0500 09/25/18 0700  BP: 123/72 114/65  Pulse: 79 80  Resp: 20 14  Temp:    SpO2: 94% 95%    Last Pain:  Vitals:   09/25/18 0430  TempSrc:   PainSc: 0-No pain                 Catalina Gravel

## 2018-09-25 NOTE — Progress Notes (Signed)
I came to do the echo but the apical window is covered completely with clear tape and if removed the tube will fall out. We will be able to do the echo once the tube is removed.

## 2018-09-25 NOTE — Discharge Instructions (Signed)
Thoracotomy, Care After ° °This sheet gives you information about how to care for yourself after your procedure. Your doctor may also give you more specific instructions. If you have problems or questions, contact your doctor. °Follow these instructions at home: °Preventing lung infection (  °pneumonia) °· Take deep breaths or do breathing exercises as told by your doctor. °· Cough often. Coughing is important to clear thick spit (phlegm) and open your lungs. If coughing hurts, hold a pillow against your chest or place both hands flat on top of your cut (splinting) when you cough. This may help with discomfort. °· Use an incentive spirometer as told. This is a tool that measures how well you fill your lungs with each breath. °· Do lung therapy (pulmonary rehabilitation) as told. °Medicines °· Take over-the-counter or prescription medicines only as told by your doctor. °· If you have pain, take pain-relieving medicine before your pain gets very bad. This will help you breathe and cough more comfortably. °· If you were prescribed an antibiotic medicine, take it as told by your doctor. Do not stop taking the antibiotic even if you start to feel better. °Activity °· Ask your doctor what activities are safe for you. °· Do not travel by airplane for 2 weeks after your chest tube is removed, or until your doctor says that this is safe. °· Do not lift anything that is heavier than 10 lb (4.5 kg), or the limit that your doctor tells you, until he or she says that it is safe. °· Do not drive until your doctor approves. °¨ Do not drive or use heavy machinery while taking prescription pain medicine. °Incision care °· Follow instructions from your doctor about how to take care of your cut from surgery (incision). Make sure you: °¨ Wash your hands with soap and water before you change your bandage (dressing). If you cannot use soap and water, use hand sanitizer. °¨ Change your bandage as told by your doctor. °¨ Leave stitches  (sutures), skin glue, or skin tape (adhesive) strips in place. They may need to stay in place for 2 weeks or longer. If tape strips get loose and curl up, you may trim the loose edges. Do not remove tape strips completely unless your doctor says it is okay. °· Keep your bandage dry. °· Check your cut from surgery every day for signs of infection. Check for: °¨ More redness, swelling, or pain. °¨ More fluid or blood. °¨ Warmth. °¨ Pus or a bad smell. °Bathing °· Do not take baths, swim, or use a hot tub until your doctor approves. You may take showers. °· After your bandage has been removed, use soap and water to gently wash your cut from surgery. Do not use anything else to clean your cut unless your doctor tells you to. °Eating and drinking °· Eat a healthy diet as told by your doctor. A healthy diet includes: °¨ Fresh fruits and vegetables. °¨ Whole grains. °¨ Low-fat (lean) proteins. °· Drink enough fluid to keep your pee (urine) clear or pale yellow. °General instructions °· To prevent or treat trouble pooping (constipation) while you are taking prescription pain medicine, your doctor may recommend that you: °¨ Take over-the-counter or prescription medicines. °¨ Eat foods that are high in fiber. These include fresh fruits and vegetables, whole grains, and beans. °¨ Limit foods that are high in fat and processed sugars, such as fried and sweet foods. °· Do not use any products that contain nicotine or tobacco. These   include cigarettes and e-cigarettes. If you need help quitting, ask your doctor. °· Avoid secondhand smoke. °· Wear compression stockings as told. These help to prevent blood clots and reduce swelling in your legs. °· If you have a chest tube, care for it as told. °· Keep all follow-up visits as told by your doctor. This is important. °Contact a doctor if: °· You have more redness, swelling, or pain around your cut from surgery. °· You have more fluid or blood coming from your cut from  surgery. °· Your cut from surgery feels warm to the touch. °· You have pus or a bad smell coming from your cut from surgery. °· You have a fever or chills. °· Your heartbeat seems uneven. °· You feel sick to your stomach (nauseous). °· You throw up (vomit). °· You have muscle aches. °· You have trouble pooping (having a bowel movement). This may mean that you: °¨ Poop fewer times in a week than normal. °¨ Have a hard time pooping. °¨ Have poop that is dry, hard, or bigger than normal. °Get help right away if: °· You get a rash. °· You feel light-headed. °· You feel like you might pass out (faint). °· You are short of breath. °· You have trouble breathing. °· You are confused. °· You have trouble talking. °· You have problems with your seeing (vision). °· You are not able to move. °· You lose feeling (have numbness) in your: °¨ Face. °¨ Arms. °¨ Legs. °· You pass out. °· You have a sudden, bad headache. °· You feel weak. °· You have chest pain. °· You have pain that: °¨ Is very bad. °¨ Gets worse, even with medicine. °Summary °· Take deep breaths, do breathing exercises, and cough often. This helps prevent lung infection (pneumonia). °· Do not drive until your doctor approves. Do not travel by airplane for 2 weeks after your chest tube is removed, or until your doctor says that this is safe. °· Check your cut from surgery every day for signs of infection. °· Eat a healthy diet. This includes fresh fruits and vegetables, whole grains, and low-fat (lean) proteins. °This information is not intended to replace advice given to you by your health care provider. Make sure you discuss any questions you have with your health care provider. °Document Released: 05/07/2012 Document Revised: 07/31/2016 Document Reviewed: 07/31/2016 °[employer] Interactive Patient Education © 2017 [employer] ° °

## 2018-09-25 NOTE — Discharge Summary (Addendum)
Physician Discharge Summary       [address]       ,[place] [address]             [phone]    Patient ID: Sean [name] MRN: [id number] DOB/AGE: 76-Jun-1942 76 y.o.  Admit date: 09/22/2018 Discharge date: 09/28/2018  Admission Diagnoses: Large left hematoma  Discharge Diagnoses:  1. S/p Left VATS (video-assisted thoracoscopic surgery, mini thoracotomy for drainage of left hemothorax,effusion 2. Anemia 3. History of CAD (coronary artery disease)-s/p CABG x 3  4. History of Chronic systolic CHF (congestive heart failure) ([place]) 5. History of CKD (chronic kidney disease), stage III ([place]) 6. History of Deep vein thrombosis (DVT) of upper extremity (HCC) 7. History of GERD (gastroesophageal reflux disease) 8. History of Hypertension 9. History of Obesity 10. 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 History of PVC's (premature ventricular contractions)  Procedure (s):  Left VATS (video-assisted thoracoscopic surgery) for drainage of left hemothorax and decortication of left upper lobe by Dr. Prescott Gum on 09/24/2018.  History of Presenting Illness: The patient is a 77 year old gentleman who underwent coronary artery bypass graft surgery by Dr. Darcey Nora on 08/26/2018.  He also had a epicardial left ventricular pacing lead placed in case the patient needed upgrade of his permanent dual-chamber pacemaker to a biventricular device.  This lead was tunneled anteriorly through the chest wall up to the left anterior chest wall just below the pacemaker generator and was placed in a separate incision there.  The patient had a history of a recent left upper extremity DVT prior to coronary bypass surgery and was treated with a course of Eliquis prior to surgery and then sent home on Coumadin postop.  His INR on 09/17/2018 was 2.5 and today it was 2.54.  The patient saw Christell Faith, PA-C with cardiology on 09/17/2018 and he was doing well.  By his report he said that he  got up out of bed this afternoon and was walking around his bed to go the bathroom and suddenly felt dizzy and sat down on the floor and fell backwards.  He said he felt much better after he laid down.  His wife found him on the floor and called EMS.  He denies passing out.  He has had a history of similar symptoms in the past.  In the [facility] emergency room he had a chest x-ray that showed a left pleural effusion.  A CTA of the chest, abdomen, and pelvis showed a large left pleural effusion that look like hemothorax.  There was a subpleural hematoma anteriorly in the area where the pacemaker lead had tunneled out through the anterior left chest wall.  There is no sign of aortic dissection.  There is no pulmonary embolus.  Patient has a  3.6 x 3.5 cm abdominal aortic aneurysm.  His hemoglobin at [facility] ER was 11.0 which is not significant change from his hemoglobin on 09/17/2018 when it was 11.2.  His hemoglobin prior to that was 9.8 on 08/30/2018.  Was called by the [facility] emergency room physician and reviewed the CTA and felt the patient should be transferred to [facility] for further evaluation.  He arrived in stable condition.  Brief Hospital Course:  The patient remained afebrile and hemodynamically stable. A line and foley were removed early in the post operative course. Chest tube output gradually decreased. Daily chest x rays were obtained and remained stable. All chest tubes were removed on 09/26/2018. Gram stain showed no  organisms seen and rare WBC and cultures showed no growth. Patient with a history of CKD. Creatinine upon admission was 1.6 and creatinine day of discharge was 1.7 . Patient is ambulating on room air. Patient is tolerating a diet and has had a bowel movement. Wound is clean and dry. Final chest X ray showed no pneumothorax or significant pleural effusion. Per Dr. Prescott Gum, no need for Coumadin at discharge as DVT left arm has resolved. Patient is felt surgically stable for  discharge today.   Latest Vital Signs: Blood pressure 139/79, pulse 80, temperature 97.9 F (36.6 C), temperature source Oral, resp. rate 18, weight 96.2 kg, SpO2 98 %.  Physical Exam:   General appearance: alert, cooperative and no distress Heart: regular rate and rhythm Lungs: clear to auscultation bilaterally Abdomen: soft, non-tender; bowel sounds normal; no masses,  no organomegaly Extremities: extremities normal, atraumatic, no cyanosis or edema Wound: clean and dry   Discharge Condition: Stable and discharged to home.  Recent laboratory studies:  Lab Results  Component Value Date   WBC 10.6 (H) 09/28/2018   HGB 7.9 (L) 09/28/2018   HCT 25.3 (L) 09/28/2018   MCV 100.4 (H) 09/28/2018   PLT 184 09/28/2018   Lab Results  Component Value Date   NA 139 09/27/2018   K 4.3 09/27/2018   CL 105 09/27/2018   CO2 28 09/27/2018   CREATININE 1.70 (H) 09/27/2018   GLUCOSE 93 09/27/2018      Diagnostic Studies: Dg Chest 2 View  Result Date: 09/27/2018 CLINICAL DATA:  77 year old male postoperative day 3 left VATS, decortication of left upper lobe. EXAM: CHEST - 2 VIEW COMPARISON:  09/26/2018 and earlier. FINDINGS: PA and lateral views. Both left chest tubes have been removed. Right IJ central line removed. No pneumothorax. Stable left chest pacemaker. Improved left lung base ventilation since 09/22/2018 with residual mild blunting and streaky left lower lung opacity. No pulmonary edema. Stable cardiac size and mediastinal contours. Mild linear right lower lung atelectasis. Negative visible bowel gas pattern. Stable visualized osseous structures. IMPRESSION: 1. Left chest tubes and right IJ central line removed. No pneumothorax. 2. Improved left lung base ventilation since 09/22/2018. Mild right lung base atelectasis. Electronically Signed   By: Genevie Ann M.D.   On: 09/27/2018 09:36   Dg Chest 2 View  Result Date: 09/22/2018 CLINICAL DATA:  Syncope. EXAM: CHEST - 2 VIEW COMPARISON:   Chest x-ray dated September 01, 2018. FINDINGS: Unchanged left chest wall pacemaker. Stable cardiomegaly status post CABG. Increasing small left pleural effusion with worsening left lower lobe opacity. The right lung is clear. No pneumothorax. No acute osseous abnormality. IMPRESSION: 1. Increasing small left pleural effusion with worsening left lower lobe atelectasis. Electronically Signed   By: Titus Dubin M.D.   On: 09/22/2018 13:21   Dg Chest 2 View  Result Date: 09/01/2018 CLINICAL DATA:  History of CABG EXAM: CHEST - 2 VIEW COMPARISON:  08/30/2018 FINDINGS: Improved aeration left base with decreased atelectasis and effusion. Small left effusion remains. Mild right lower lobe atelectasis unchanged. Negative for edema or pneumonia. Pacemaker unchanged. IMPRESSION: Improvement in left lower lobe atelectasis and effusion. Mild right lower lobe atelectasis unchanged. Electronically Signed   By: Franchot Gallo M.D.   On: 09/01/2018 06:56   Dg Chest 2 View  Result Date: 08/30/2018 CLINICAL DATA:  History of coronary bypass grafting EXAM: CHEST - 2 VIEW COMPARISON:  08/29/2018 FINDINGS: Cardiac shadow remains enlarged. Postsurgical changes are again seen. Pacing device is again noted  and stable. Stable left pleural effusion and likely underlying atelectasis is seen. Some mild right perihilar atelectatic changes are noted as well. IMPRESSION: Stable changes in the left base. Slight increase in right perihilar atelectasis. Electronically Signed   By: Inez Catalina M.D.   On: 08/30/2018 10:11   Dg Chest Port 1 View  Result Date: 09/26/2018 CLINICAL DATA:  Sob  today,chest tube present s/p pleural effusion EXAM: PORTABLE CHEST 1 VIEW COMPARISON:  09/25/2018 FINDINGS: RIGHT IJ central line tip overlies the superior vena cava. Status post median sternotomy. LEFT-sided transvenous pacemaker leads overlie the RIGHT atrium and RIGHT ventricle. A LEFT-sided chest tube is in place, unchanged. There is minimal LEFT  LOWER lobe atelectasis. No pneumothorax or pulmonary edema. RIGHT lung is clear. IMPRESSION: Minimal LEFT LOWER lobe atelectasis.  No pneumothorax. Electronically Signed   By: Nolon Nations M.D.   On: 09/26/2018 09:44   Dg Chest Port 1 View  Result Date: 09/25/2018 CLINICAL DATA:  Chest tube follow-up EXAM: PORTABLE CHEST 1 VIEW COMPARISON:  Yesterday FINDINGS: Cardiomegaly that is stable. There is trans venous and direct epicardial pacer leads. Right IJ line with tip at the upper SVC. Left-sided chest tube in stable position. No visible pneumothorax. Mild atelectasis at the bases. IMPRESSION: 1. No visible pneumothorax. 2. Low volumes with atelectasis. Electronically Signed   By: Monte Fantasia M.D.   On: 09/25/2018 09:33   Dg Chest Port 1 View  Result Date: 09/24/2018 CLINICAL DATA:  History of left VATS and pneumothorax, follow-up EXAM: PORTABLE CHEST 1 VIEW COMPARISON:  Portable chest x-ray of 09/23/2017 FINDINGS: A left chest tube remains and no definite pneumothorax is seen. There is haziness at the left lung base most consistent with mild atelectasis. The right lung appears clear. Cardiomegaly is stable. Right IJ central venous line tip overlies the mid SVC. Permanent pacemaker wires are noted. IMPRESSION: 1. Left chest tube remains with no definite left pneumothorax. Improved aeration of the left lung. 2. Mild left basilar atelectasis. Electronically Signed   By: Ivar Drape M.D.   On: 09/24/2018 14:56   Portable Chest 1 View  Result Date: 09/23/2018 CLINICAL DATA:  Shortness of breath. EXAM: PORTABLE CHEST 1 VIEW COMPARISON:  Radiograph September 22, 2018. FINDINGS: Stable cardiomegaly is noted. Left-sided pacemaker is unchanged in position. No pneumothorax is noted. Right lung is clear. Stable left pleural effusion is noted with associated left basilar atelectasis or infiltrate. Increased left suprahilar opacity is noted concerning for possible pneumonia. Bony thorax is unremarkable.  IMPRESSION: Stable left pleural effusion with associated atelectasis or infiltrate. Increased left suprahilar opacity is noted concerning for possible pneumonia. Electronically Signed   By: Marijo Conception, M.D.   On: 09/23/2018 08:02   Ct Angio Chest/abd/pel For Dissection W And/or Wo Contrast  Result Date: 09/22/2018 CLINICAL DATA:  Had a near syncopal episode at home. When EMS arrived pt was diaphoretic, pale in color, in and out of consciousness per EMS. BP was 72/38 initially. EXAM: CT ANGIOGRAPHY CHEST, ABDOMEN AND PELVIS TECHNIQUE: Multidetector CT imaging through the chest, abdomen and pelvis was performed using the standard protocol during bolus administration of intravenous contrast. Multiplanar reconstructed images and MIPs were obtained and reviewed to evaluate the vascular anatomy. CONTRAST:  39mL ISOVUE-370 IOPAMIDOL (ISOVUE-370) INJECTION 76% COMPARISON:  CT chest 06/26/2018, 05/29/2018 FINDINGS: CTA CHEST FINDINGS Cardiovascular: Satisfactory opacification of the pulmonary arteries to the segmental level. No evidence of pulmonary embolism. Normal heart size. No pericardial effusion. Thoracic aorta is normal in caliber. No  thoracic aortic dissection. Ulcerated plaque along the left lateral margin of the aortic arch, but the wall of the aorta is normal with normal surrounding fat. Prior CABG. Origins of coronary grafts are normal. Graft extending to the circumflex artery distally has irregular appearance without active bleeding. Mediastinum/Nodes: No enlarged mediastinal, hilar, or axillary lymph nodes. Thyroid gland, trachea, and esophagus demonstrate no significant findings. Lungs/Pleura: Large left hemothorax. Subpleural hematoma along the left upper chest. Chronic lung is clear. No pneumothorax. Musculoskeletal: No acute osseous abnormality. No aggressive osseous lesion. Mild thoracic spine spondylosis. Review of the MIP images confirms the above findings. CTA ABDOMEN AND PELVIS FINDINGS  VASCULAR Aorta: Abdominal aortic aneurysm measuring 3.6 x 3.5 cm. No dissection, vasculitis or significant stenosis. Atherosclerotic disease. Celiac: Patent without evidence of aneurysm, dissection, vasculitis or significant stenosis. SMA: Patent without evidence of aneurysm, dissection, vasculitis or significant stenosis. Renals: Mild atherosclerotic plaque at the origin of bilateral renal arteries. No aneurysm, dissection, vasculitis or fibromuscular dysplasia. IMA: Patent without evidence of aneurysm, dissection, vasculitis or significant stenosis. Inflow: Patent without evidence of aneurysm, dissection, vasculitis or significant stenosis. Veins: No obvious venous abnormality within the limitations of this arterial phase study. Review of the MIP images confirms the above findings. NON-VASCULAR Hepatobiliary: No focal liver abnormality is seen. No gallstones, gallbladder wall thickening, or biliary dilatation. Pancreas: Unremarkable. No pancreatic ductal dilatation or surrounding inflammatory changes. Spleen: Normal in size without focal abnormality. Adrenals/Urinary Tract: Normal adrenal glands. Mild renal atrophy bilaterally. 3.3 cm left parapelvic cyst. No obstructive uropathy. Normal bladder. Stomach/Bowel: Stomach is within normal limits. Appendix appears normal. No evidence of bowel wall thickening, distention, or inflammatory changes. Large amount of stool in the rectum. Lymphatic: No lymphadenopathy. Reproductive: Prostate is unremarkable.  Right hydrocele. Other: No abdominal wall hernia or abnormality. No abdominopelvic ascites. Musculoskeletal: No acute osseous abnormality. No aggressive osseous lesion. Degenerative disc disease with disc height loss at T12-L1, L1-2 and L2-3. Bilateral facet arthropathy throughout the lumbar spine. Review of the MIP images confirms the above findings. IMPRESSION: 1. Large left hemothorax without active bleeding. No aortic dissection. Ulcerated plaque along the left  lateral margin of the aortic arch, but the wall of the aorta is normal with normal surrounding fat. Prior CABG. Origins of coronary grafts are normal. Graft extending to the circumflex artery distally has irregular margins which may be postsurgical although there is no surrounding hematoma or active bleeding at this site. 2. No pulmonary embolus. 3. Abdominal aortic aneurysm measuring 3.6 x 3.5 cm. Abdominal Aortic Aneurysm (ICD10-I71.9). Recommend follow-up aortic ultrasound in 2 years. This recommendation follows ACR consensus guidelines: White Paper of the ACR Incidental Findings Committee II on Vascular Findings. J Am Coll Radiol 2013; 10:789-794. Critical Value/emergent results were called by telephone at the time of interpretation on 09/22/2018 at 3:39 pm to Dr. Nance Pear , who verbally acknowledged these results. Electronically Signed   By: Kathreen Devoid   On: 09/22/2018 15:40   Vas [place] Upper Extremity Venous Duplex  Result Date: 08/30/2018 UPPER VENOUS STUDY  Indications: Swelling, and Pain Limitations: Body habitus and severe pain and very sensitive for compression. Performing Technologist: Lorina Rabon  Examination Guidelines: A complete evaluation includes B-mode imaging, spectral Doppler, color Doppler, and power Doppler as needed of all accessible portions of each vessel. Bilateral testing is considered an integral part of a complete examination. Limited examinations for reoccurring indications may be performed as noted.  Right Findings: +----------+------------+----------+---------+-----------+-------+ RIGHT     CompressiblePropertiesPhasicitySpontaneousSummary +----------+------------+----------+---------+-----------+-------+ Subclavian  Full                 Yes       Yes            +----------+------------+----------+---------+-----------+-------+  Left Findings: +----------+------------+----------+---------+-----------+-------+ LEFT       CompressiblePropertiesPhasicitySpontaneousSummary +----------+------------+----------+---------+-----------+-------+ IJV           Full                 Yes       Yes            +----------+------------+----------+---------+-----------+-------+ Subclavian    Full                 Yes       Yes            +----------+------------+----------+---------+-----------+-------+ Axillary      Full                 Yes       Yes            +----------+------------+----------+---------+-----------+-------+ Brachial      Full                 Yes       Yes            +----------+------------+----------+---------+-----------+-------+ Radial        Full                                          +----------+------------+----------+---------+-----------+-------+ Ulnar         Full                                          +----------+------------+----------+---------+-----------+-------+ Cephalic      Full                                          +----------+------------+----------+---------+-----------+-------+ Basilic       Full                 Yes       Yes            +----------+------------+----------+---------+-----------+-------+  Summary:  Right: No evidence of thrombosis in the subclavian.  Left: No evidence of deep vein thrombosis in the upper extremity. No evidence of superficial vein thrombosis in the upper extremity. No evidence of thrombosis in the subclavian.  *See table(s) above for measurements and observations.  Diagnosing physician: Servando Snare MD Electronically signed by Servando Snare MD on 08/30/2018 at 11:56:29 AM.    Final     Discharge Medications: Allergies as of 09/28/2018      Reactions   Azithromycin Hives   Erythromycin Hives      Medication List    STOP taking these medications   warfarin 5 MG tablet Commonly known as:  COUMADIN     TAKE these medications   albuterol 108 (90 Base) MCG/ACT inhaler Commonly known as:  PROVENTIL  HFA;VENTOLIN HFA Inhale 1-2 puffs into the lungs every 4 (four) hours as needed for wheezing or shortness of breath.   allopurinol 100 MG tablet Commonly known as:  ZYLOPRIM Take 100 mg by mouth 2 (two) times daily.  aspirin EC 81 MG tablet Take 81 mg by mouth daily.   atorvastatin 40 MG tablet Commonly known as:  LIPITOR Take 1 tablet (40 mg total) by mouth daily.   ferrous sulfate 325 (65 FE) MG EC tablet Take 1 tablet (325 mg total) by mouth 2 (two) times daily with a meal.   Fluticasone-Salmeterol 100-50 MCG/DOSE Aepb Commonly known as:  ADVAIR Inhale 1 puff into the lungs daily as needed (shortness of breath).   metoprolol succinate 50 MG 24 hr tablet Commonly known as:  TOPROL-XL Take 1 tablet (50 mg total) by mouth daily.   multivitamin tablet Take 1 tablet by mouth daily.   PROSTATE PO Take 1 tablet by mouth daily.   traMADol 50 MG tablet Commonly known as:  ULTRAM Take 1 tablet (50 mg total) by mouth every 6 (six) hours as needed (mild pain). What changed:    how much to take  how to take this  when to take this  reasons to take this  additional instructions       Follow Up Appointments: Follow-up Information    Prescott Gum, Collier Salina, MD. Go on 10/07/2018.   Specialty:  Cardiothoracic Surgery Why:  PA/LAT CXR to be taken (at [facility] which is in the same building as Dr. Lucianne Lei Trigt's office) on 10/07/2018 at 3:00 pm;Appointment time is at 3:30 pm and is with physician assistant Contact information: [place] [address] [phone]           Signed: Ellamae Sia 09/28/2018, 9:40 AM   patient examined and medical record reviewed,agree with above note. Tharon Aquas Trigt III 10/04/2018

## 2018-09-25 NOTE — Progress Notes (Signed)
[facility] BRIEF SICU PROGRESS NOTE  1 Day Post-Op  S/P Procedure(s) (LRB): VIDEO ASSISTED THORACOSCOPY (Left) DRAINAGE OF LOCULATED PLEURAL EFFUSION (Left)   Stable day Adequate analgesia Breathing comfortably on room air  Plan: Continue current plan  Rexene Alberts, MD 09/25/2018 7:05 PM

## 2018-09-25 NOTE — Progress Notes (Addendum)
      [address]       [place] [address]             [phone]       1 Day Post-Op Procedure(s) (LRB): VIDEO ASSISTED THORACOSCOPY (Left) DRAINAGE OF LOCULATED PLEURAL EFFUSION (Left)  Subjective: Patient without specific complaints this am as is just waking up.  Objective: Vital signs in last 24 hours: Temp:  [97.6 F (36.4 C)-98.6 F (37 C)] 98.1 F (36.7 C) (11/06 0801) Pulse Rate:  [79-84] 80 (11/06 0700) Cardiac Rhythm: A-V Sequential paced (11/06 0400) Resp:  [12-28] 14 (11/06 0700) BP: (107-163)/(56-93) 114/65 (11/06 0700) SpO2:  [92 %-100 %] 95 % (11/06 0700) Arterial Line BP: (92-200)/(43-99) 146/43 (11/06 0700) Weight:  [94.8 kg] 94.8 kg (11/06 0500)   Intake/Output from previous day: 11/05 0701 - 11/06 0700 In: 1770.3 [P.O.:100; I.V.:1220.4; IV Piggyback:449.9] Out: 4570 [Urine:2430; Blood:150; Chest Tube:190]   Physical Exam:  Cardiovascular: Paced  Pulmonary: Slightly diminished left base Abdomen: Soft, non tender, bowel sounds present. Extremities: SCDs in place Wounds: Dressing is clean and dry.  Chest Tube: to suction, no air leak  Lab Results: CBC: Recent Labs    09/24/18 0336 09/25/18 0500  WBC 10.9* 11.4*  HGB 8.4* 7.8*  HCT 26.6* 25.6*  PLT 167 173   BMET:  Recent Labs    09/24/18 0336 09/25/18 0500  NA 137 139  K 4.8 4.7  CL 105 106  CO2 27 29  GLUCOSE 100* 117*  BUN 33* 23  CREATININE 1.82* 1.36*  CALCIUM 8.6* 8.5*    PT/INR:  Recent Labs    09/24/18 0336  LABPROT 21.9*  INR 1.94   ABG:  INR: Will add last result for INR, ABG once components are confirmed Will add last 4 CBG results once components are confirmed  Assessment/Plan:  1. CV - Paced. Will NOT restart Coumadin yet (on for DVT) 2.  Pulmonary - On 1 liter of oxygen via [place]. Chest tube with 190 cc of output since surgery. Chest tube is to suction and there is no air leak. CXR this am shows patient rotated to the right, no  pneumothorax. Encourage incentive spirometer. 3. Renal-history of CKD (stage III). Creatinine decreased from 1.82 to 1.36 4. Anemia-H and H decreased to 7.8 and 25.6 this am. Continue multivitamin  Donielle M ZimmermanPA-C 09/25/2018,8:06 AM [phone]  Expected blood loss anemia CXR clear- DC tubes tomorrow Creat improved- hold lasix Will not resume coumadin at this time for L arm DVT   treated almost 6 months  patient examined and medical record reviewed,agree with above note. Tharon Aquas Trigt III 09/25/2018

## 2018-09-26 ENCOUNTER — Inpatient Hospital Stay (HOSPITAL_COMMUNITY): Payer: PPO

## 2018-09-26 ENCOUNTER — Encounter (HOSPITAL_COMMUNITY): Payer: PPO

## 2018-09-26 LAB — POCT I-STAT 4, (NA,K, GLUC, HGB,HCT)
Glucose, Bld: 121 mg/dL — ABNORMAL HIGH (ref 70–99)
HCT: 23 % — ABNORMAL LOW (ref 39.0–52.0)
Hemoglobin: 7.8 g/dL — ABNORMAL LOW (ref 13.0–17.0)
Potassium: 4.8 mmol/L (ref 3.5–5.1)
Sodium: 137 mmol/L (ref 135–145)

## 2018-09-26 LAB — COMPREHENSIVE METABOLIC PANEL
ALT: 11 U/L (ref 0–44)
AST: 36 U/L (ref 15–41)
Albumin: 3.2 g/dL — ABNORMAL LOW (ref 3.5–5.0)
Alkaline Phosphatase: 42 U/L (ref 38–126)
Anion gap: 4 — ABNORMAL LOW (ref 5–15)
BUN: 29 mg/dL — ABNORMAL HIGH (ref 8–23)
CO2: 28 mmol/L (ref 22–32)
Calcium: 8.1 mg/dL — ABNORMAL LOW (ref 8.9–10.3)
Chloride: 104 mmol/L (ref 98–111)
Creatinine, Ser: 1.56 mg/dL — ABNORMAL HIGH (ref 0.61–1.24)
GFR calc Af Amer: 48 mL/min — ABNORMAL LOW (ref >60)
GFR calc non Af Amer: 41 mL/min — ABNORMAL LOW (ref >60)
Glucose, Bld: 108 mg/dL — ABNORMAL HIGH (ref 70–99)
Potassium: 4.3 mmol/L (ref 3.5–5.1)
Sodium: 136 mmol/L (ref 135–145)
Total Bilirubin: 0.8 mg/dL (ref 0.3–1.2)
Total Protein: 5.2 g/dL — ABNORMAL LOW (ref 6.5–8.1)

## 2018-09-26 LAB — CBC
HCT: 24.4 % — ABNORMAL LOW (ref 39.0–52.0)
Hemoglobin: 7.4 g/dL — ABNORMAL LOW (ref 13.0–17.0)
MCH: 31 pg (ref 26.0–34.0)
MCHC: 30.3 g/dL (ref 30.0–36.0)
MCV: 102.1 fL — ABNORMAL HIGH (ref 80.0–100.0)
Platelets: 152 10*3/uL (ref 150–400)
RBC: 2.39 MIL/uL — ABNORMAL LOW (ref 4.22–5.81)
RDW: 13.2 % (ref 11.5–15.5)
WBC: 11.2 10*3/uL — ABNORMAL HIGH (ref 4.0–10.5)
nRBC: 0 % (ref 0.0–0.2)

## 2018-09-26 LAB — TYPE AND SCREEN
ABO/RH(D): A POS
Antibody Screen: NEGATIVE
Unit division: 0
Unit division: 0

## 2018-09-26 LAB — BPAM RBC
Blood Product Expiration Date: 201912012359
Blood Product Expiration Date: 201912012359
ISSUE DATE / TIME: 201911051234
ISSUE DATE / TIME: 201911051234
Unit Type and Rh: 6200
Unit Type and Rh: 6200

## 2018-09-26 LAB — GLUCOSE, CAPILLARY
Glucose-Capillary: 100 mg/dL — ABNORMAL HIGH (ref 70–99)
Glucose-Capillary: 123 mg/dL — ABNORMAL HIGH (ref 70–99)

## 2018-09-26 MED ORDER — INSULIN ASPART 100 UNIT/ML ~~LOC~~ SOLN: 0.0000 [IU] | Freq: Three times a day (TID) | SUBCUTANEOUS | Status: DC

## 2018-09-26 MED ORDER — GUAIFENESIN ER 600 MG PO TB12
600.0000 mg | ORAL_TABLET | Freq: Two times a day (BID) | ORAL | Status: DC
  Administered 2018-09-26 – 2018-09-28 (×5): 600 mg via ORAL
  Filled 2018-09-26 (×5): qty 1

## 2018-09-26 MED ORDER — SODIUM CHLORIDE 0.9 % IV SOLN
125.0000 mg | Freq: Once | INTRAVENOUS | Status: AC
  Administered 2018-09-26: 125 mg via INTRAVENOUS
  Filled 2018-09-26: qty 10

## 2018-09-26 MED ORDER — INSULIN ASPART 100 UNIT/ML ~~LOC~~ SOLN: 0.0000 [IU] | Freq: Every day | SUBCUTANEOUS | Status: DC

## 2018-09-26 NOTE — Progress Notes (Signed)
Wasted 22 mls (54mcg/ml) fentanyl pca with Nanci Pina, RN.  Joellen Jersey, RN

## 2018-09-26 NOTE — Progress Notes (Addendum)
T[facility] DAILY ICU PROGRESS NOTE                   [address] [place]            [place] [address]          [phone]   2 Days Post-Op Procedure(s) (LRB): VIDEO ASSISTED THORACOSCOPY (Left) DRAINAGE OF LOCULATED PLEURAL EFFUSION (Left)  Total Length of Stay:  LOS: 4 days   Subjective: Patient with cough and having trouble "getting sputum up".  Objective: Vital signs in last 24 hours: Temp:  [97.7 F (36.5 C)-98.6 F (37 C)] 97.9 F (36.6 C) (11/07 0400) Pulse Rate:  [79-83] 80 (11/07 0700) Cardiac Rhythm: Atrial paced (11/07 0400) Resp:  [11-25] 17 (11/07 0700) BP: (97-143)/(57-84) 133/71 (11/07 0700) SpO2:  [91 %-100 %] 92 % (11/07 0700) Arterial Line BP: (148)/(46) 148/46 (11/06 0800) Weight:  [97.7 kg] 97.7 kg (11/07 0304)  Filed Weights   09/25/18 0500 09/26/18 0304  Weight: 94.8 kg 97.7 kg    Weight change: 2.9 kg       Intake/Output from previous day: 11/06 0701 - 11/07 0700 In: 1838.6 [P.O.:1340; I.V.:398.6; IV Piggyback:100] Out: 2450 [Urine:2270; Chest Tube:180]  Intake/Output this shift: No intake/output data recorded.  Current Meds: Scheduled Meds: . acetaminophen  1,000 mg Oral Q6H   Or  . acetaminophen (TYLENOL) oral liquid 160 mg/5 mL  1,000 mg Oral Q6H  . allopurinol  100 mg Oral BID  . aspirin EC  81 mg Oral Daily  . atorvastatin  40 mg Oral Daily  . bisacodyl  10 mg Oral Daily  . fentaNYL   Intravenous Q4H  . ferrous [id number] C-folic acid  1 capsule Oral BID PC  . insulin aspart  0-24 Units Subcutaneous Q6H  . metoprolol succinate  50 mg Oral Daily  . mometasone-formoterol  2 puff Inhalation BID  . multivitamin with minerals  1 tablet Oral Daily  . senna-docusate  1 tablet Oral QHS   Continuous Infusions: . sodium chloride Stopped (09/26/18 0546)  . bupivacaine ON-Q pain pump    . potassium chloride     PRN Meds:.albuterol, diphenhydrAMINE **OR** diphenhydrAMINE, hydrALAZINE, labetalol, naloxone **AND**  sodium chloride flush, ondansetron (ZOFRAN) IV, oxyCODONE, phenol, potassium chloride, sorbitol, traMADol  Cardiovascular: Paced  Pulmonary: Slightly diminished left base Abdomen: Soft, non tender, bowel sounds present. Extremities: SCDs in place Wounds: Dressing is clean and dry.  Chest Tubes: to suction, no air leak  Lab Results: CBC: Recent Labs    09/25/18 0500 09/26/18 0313  WBC 11.4* 11.2*  HGB 7.8* 7.4*  HCT 25.6* 24.4*  PLT 173 152   BMET:  Recent Labs    09/25/18 0500 09/26/18 0313  NA 139 136  K 4.7 4.3  CL 106 104  CO2 29 28  GLUCOSE 117* 108*  BUN 23 29*  CREATININE 1.36* 1.56*  CALCIUM 8.5* 8.1*    CMET: Lab Results  Component Value Date   WBC 11.2 (H) 09/26/2018   HGB 7.4 (L) 09/26/2018   HCT 24.4 (L) 09/26/2018   PLT 152 09/26/2018   GLUCOSE 108 (H) 09/26/2018   CHOL 116 06/26/2018   TRIG 64 06/26/2018   HDL 43 06/26/2018   LDLCALC 60 06/26/2018   ALT 11 09/26/2018   AST 36 09/26/2018   NA 136 09/26/2018   K 4.3 09/26/2018   CL 104 09/26/2018   CREATININE 1.56 (H) 09/26/2018   BUN 29 (H) 09/26/2018   CO2 28 09/26/2018  TSH 3.141 06/26/2018   INR 1.94 09/24/2018   HGBA1C 6.0 (H) 08/22/2018    PT/INR:  Recent Labs    09/24/18 0336  LABPROT 21.9*  INR 1.94   Radiology: No results found.   Assessment/Plan: S/P Procedure(s) (LRB): VIDEO ASSISTED THORACOSCOPY (Left) DRAINAGE OF LOCULATED PLEURAL EFFUSION (Left)  1. CV - Paced at 80. Will NOT restart Coumadin yet (on for DVT) 2.  Pulmonary - On 1 liter of oxygen via [place]. Chest tube with 180 cc of output since surgery. Chest tubes are to suction and there is no air leak. CXR this am appears stable (shows  no pneumothorax, atelectasis). Hope to remove one chest tube. Encourage incentive spirometer. Mucinex bid 3. Renal-history of CKD (stage III). Creatinine slightly increased to 1.56. Will re check in am 4. Anemia-H and H stable at 7.4 and 24.4 this am. On Trinsicon and  multivitamin    Donielle M Zimmerman PA-C  Remove chest tubes and tx to 4 E IV FE gluconate then DC central line patient examined and medical record reviewed,agree with above note. Tharon Aquas Trigt III 09/26/2018     09/26/2018 7:17 AM

## 2018-09-27 ENCOUNTER — Inpatient Hospital Stay (HOSPITAL_COMMUNITY): Payer: PPO

## 2018-09-27 ENCOUNTER — Other Ambulatory Visit: Payer: PPO

## 2018-09-27 LAB — BASIC METABOLIC PANEL
Anion gap: 6 (ref 5–15)
BUN: 28 mg/dL — ABNORMAL HIGH (ref 8–23)
CO2: 28 mmol/L (ref 22–32)
Calcium: 8.6 mg/dL — ABNORMAL LOW (ref 8.9–10.3)
Chloride: 105 mmol/L (ref 98–111)
Creatinine, Ser: 1.7 mg/dL — ABNORMAL HIGH (ref 0.61–1.24)
GFR calc Af Amer: 43 mL/min — ABNORMAL LOW (ref >60)
GFR calc non Af Amer: 37 mL/min — ABNORMAL LOW (ref >60)
Glucose, Bld: 93 mg/dL (ref 70–99)
Potassium: 4.3 mmol/L (ref 3.5–5.1)
Sodium: 139 mmol/L (ref 135–145)

## 2018-09-27 LAB — CBC
HCT: 24.3 % — ABNORMAL LOW (ref 39.0–52.0)
Hemoglobin: 7.3 g/dL — ABNORMAL LOW (ref 13.0–17.0)
MCH: 30.7 pg (ref 26.0–34.0)
MCHC: 30 g/dL (ref 30.0–36.0)
MCV: 102.1 fL — ABNORMAL HIGH (ref 80.0–100.0)
Platelets: 182 10*3/uL (ref 150–400)
RBC: 2.38 MIL/uL — ABNORMAL LOW (ref 4.22–5.81)
RDW: 13.3 % (ref 11.5–15.5)
WBC: 9.2 10*3/uL (ref 4.0–10.5)
nRBC: 0 % (ref 0.0–0.2)

## 2018-09-27 LAB — GLUCOSE, CAPILLARY
Glucose-Capillary: 162 mg/dL — ABNORMAL HIGH (ref 70–99)
Glucose-Capillary: 90 mg/dL (ref 70–99)

## 2018-09-27 MED ORDER — INFLUENZA VAC SPLIT HIGH-DOSE 0.5 ML IM SUSY
0.5000 mL | PREFILLED_SYRINGE | INTRAMUSCULAR | Status: DC
  Filled 2018-09-27: qty 0.5

## 2018-09-27 NOTE — Care Management Important Message (Signed)
Important Message  Patient Details  Name: Sean [name] MRN: [id number] Date of Birth: [birth year]   Medicare Important Message Given:  Yes    [name] [name] 09/27/2018, 4:13 PM

## 2018-09-27 NOTE — Progress Notes (Addendum)
[address]            [place] [address]          [phone]   3 Days Post-Op Procedure(s) (LRB): VIDEO ASSISTED THORACOSCOPY (Left) DRAINAGE OF LOCULATED PLEURAL EFFUSION (Left)  Total Length of Stay:  LOS: 5 days   Subjective: Patient with cough and having trouble "getting sputum up".  Objective: Vital signs in last 24 hours: Temp:  [97.9 F (36.6 C)-98.4 F (36.9 C)] 97.9 F (36.6 C) (11/08 0626) Pulse Rate:  [80] 80 (11/08 0626) Cardiac Rhythm: A-V Sequential paced (11/07 1932) Resp:  [16-21] 18 (11/08 0626) BP: (114-144)/(64-77) 136/76 (11/08 0626) SpO2:  [93 %-98 %] 96 % (11/08 0626) Weight:  [96.3 kg] 96.3 kg (11/08 0626)  Filed Weights   09/25/18 0500 09/26/18 0304 09/27/18 0626  Weight: 94.8 kg 97.7 kg 96.3 kg       Intake/Output from previous day: 11/07 0701 - 11/08 0700 In: 735.1 [P.O.:580; I.V.:45.1; IV Piggyback:110] Out: 625 [Urine:625]   Current Meds: Scheduled Meds: . acetaminophen  1,000 mg Oral Q6H   Or  . acetaminophen (TYLENOL) oral liquid 160 mg/5 mL  1,000 mg Oral Q6H  . allopurinol  100 mg Oral BID  . aspirin EC  81 mg Oral Daily  . atorvastatin  40 mg Oral Daily  . bisacodyl  10 mg Oral Daily  . ferrous [id number] C-folic acid  1 capsule Oral BID PC  . guaiFENesin  600 mg Oral BID  . insulin aspart  0-15 Units Subcutaneous TID WC  . insulin aspart  0-5 Units Subcutaneous QHS  . metoprolol succinate  50 mg Oral Daily  . mometasone-formoterol  2 puff Inhalation BID  . multivitamin with minerals  1 tablet Oral Daily  . senna-docusate  1 tablet Oral QHS   Continuous Infusions: . sodium chloride Stopped (09/26/18 1045)  . bupivacaine ON-Q pain pump    . potassium chloride     PRN Meds:.albuterol, hydrALAZINE, labetalol, oxyCODONE, phenol, potassium chloride, sorbitol, traMADol  Cardiovascular: Paced  Pulmonary: Slightly diminished left base Abdomen: Soft, non tender, bowel sounds  present. Extremities: SCDs in place Wounds: Dressing is clean and dry.  Chest Tubes: to suction, no air leak  Lab Results: CBC: Recent Labs    09/26/18 0313 09/27/18 0400  WBC 11.2* 9.2  HGB 7.4* 7.3*  HCT 24.4* 24.3*  PLT 152 182   BMET:  Recent Labs    09/26/18 0313 09/27/18 0400  NA 136 139  K 4.3 4.3  CL 104 105  CO2 28 28  GLUCOSE 108* 93  BUN 29* 28*  CREATININE 1.56* 1.70*  CALCIUM 8.1* 8.6*    CMET: Lab Results  Component Value Date   WBC 9.2 09/27/2018   HGB 7.3 (L) 09/27/2018   HCT 24.3 (L) 09/27/2018   PLT 182 09/27/2018   GLUCOSE 93 09/27/2018   CHOL 116 06/26/2018   TRIG 64 06/26/2018   HDL 43 06/26/2018   LDLCALC 60 06/26/2018   ALT 11 09/26/2018   AST 36 09/26/2018   NA 139 09/27/2018   K 4.3 09/27/2018   CL 105 09/27/2018   CREATININE 1.70 (H) 09/27/2018   BUN 28 (H) 09/27/2018   CO2 28 09/27/2018   TSH 3.141 06/26/2018   INR 1.94 09/24/2018   HGBA1C 6.0 (H) 08/22/2018    PT/INR:  No results for input(s): LABPROT, INR  in the last 72 hours. Radiology: No results found.   Assessment/Plan: S/P Procedure(s) (LRB): VIDEO ASSISTED THORACOSCOPY (Left) DRAINAGE OF LOCULATED PLEURAL EFFUSION (Left)  1. CV - Paced at 80. On Toprol XL 50 mg daily. Will discuss with Dr. Prescott Gum when to restart Coumadin. 2.  Pulmonary - On room air.  Encourage incentive spirometer. CXR this am appears to show small pleural effusion. Continue Dulera and Mucinex bid 3. Renal-history of CKD (stage III). Creatinine slightly increased to 1.7 Will re check in am (admission creatinine 1.62) 4. Anemia-H and H stable at 7.3 and 24.3 this am. On Trinsicon and multivitamin 5. CBGs 106/100/123. No history of diabetes. Will stop accu checks and SS PRN 6. Hopefully home 1-2 days   Nani Skillern PA-C  CXR clear Hb stable prob home sat  NO COUMADIN needed, L arm DVT resolved LEAVE chest tube sutures in at DC Cont po Fe 3 wks patient examined and medical  record reviewed,agree with above note. Tharon Aquas Trigt III 09/27/2018

## 2018-09-28 LAB — CBC
HCT: 25.3 % — ABNORMAL LOW (ref 39.0–52.0)
Hemoglobin: 7.9 g/dL — ABNORMAL LOW (ref 13.0–17.0)
MCH: 31.3 pg (ref 26.0–34.0)
MCHC: 31.2 g/dL (ref 30.0–36.0)
MCV: 100.4 fL — ABNORMAL HIGH (ref 80.0–100.0)
Platelets: 184 10*3/uL (ref 150–400)
RBC: 2.52 MIL/uL — ABNORMAL LOW (ref 4.22–5.81)
RDW: 13.2 % (ref 11.5–15.5)
WBC: 10.6 10*3/uL — ABNORMAL HIGH (ref 4.0–10.5)
nRBC: 0 % (ref 0.0–0.2)

## 2018-09-28 MED ORDER — FERROUS SULFATE 325 (65 FE) MG PO TBEC: 325.0000 mg | DELAYED_RELEASE_TABLET | Freq: Two times a day (BID) | ORAL | 0 refills | Status: DC

## 2018-09-28 MED ORDER — TRAMADOL HCL 50 MG PO TABS: 50.0000 mg | ORAL_TABLET | Freq: Four times a day (QID) | ORAL | 0 refills | Status: DC | PRN

## 2018-09-28 NOTE — Progress Notes (Signed)
Patient is ready for discharge. He is alert and oriented. He has all of his belongings. All of patient's questions regarding his discharge have been answered. He has been taken off of telemetry, CCMD notified. IV has been removed without complication, catheter intact. He will be transported home by his wife who is here with him. He will leave unit by wheelchair and meet his wife at the front entrance of the hospital

## 2018-09-28 NOTE — Progress Notes (Signed)
      [address]       [place] [address]             [phone]      4 Days Post-Op Procedure(s) (LRB): VIDEO ASSISTED THORACOSCOPY (Left) DRAINAGE OF LOCULATED PLEURAL EFFUSION (Left)   Subjective:  No new complaints.  Looks good and feels well overall.  + ambulation  + BM  Objective: Vital signs in last 24 hours: Temp:  [97.9 F (36.6 C)-98.5 F (36.9 C)] 97.9 F (36.6 C) (11/09 0456) Pulse Rate:  [80] 80 (11/09 0456) Cardiac Rhythm: A-V Sequential paced (11/09 0700) Resp:  [15-18] 18 (11/09 0456) BP: (123-148)/(63-80) 139/79 (11/09 0456) SpO2:  [93 %-98 %] 93 % (11/09 0456) Weight:  [96.2 kg] 96.2 kg (11/09 0456)  General appearance: alert, cooperative and no distress Heart: regular rate and rhythm Lungs: clear to auscultation bilaterally Abdomen: soft, non-tender; bowel sounds normal; no masses,  no organomegaly Extremities: extremities normal, atraumatic, no cyanosis or edema Wound: clean and dry  Lab Results: Recent Labs    09/27/18 0400 09/28/18 0311  WBC 9.2 10.6*  HGB 7.3* 7.9*  HCT 24.3* 25.3*  PLT 182 184   BMET:  Recent Labs    09/26/18 0313 09/27/18 0400  NA 136 139  K 4.3 4.3  CL 104 105  CO2 28 28  GLUCOSE 108* 93  BUN 29* 28*  CREATININE 1.56* 1.70*  CALCIUM 8.1* 8.6*    PT/INR: No results for input(s): LABPROT, INR in the last 72 hours. ABG    Component Value Date/Time   PHART 7.385 09/25/2018 0500   HCO3 26.8 09/25/2018 0500   TCO2 26 08/27/2018 1641   ACIDBASEDEF 3.0 (H) 08/26/2018 2002   O2SAT 92.4 09/25/2018 0500   CBG (last 3)  Recent Labs    09/26/18 1637 09/26/18 2254 09/27/18 0628  GLUCAP 123* 162* 90    Assessment/Plan: S/P Procedure(s) (LRB): VIDEO ASSISTED THORACOSCOPY (Left) DRAINAGE OF LOCULATED PLEURAL EFFUSION (Left)  1. CV- Paced Rhythm- continue Toprol XL, no coumadin indicated, will start low dose ASA 2. Pulm- no acute issues, off oxygen, CXR is free from pneumothorax,  significant effusions 3. Renal- creatinine has been stable, baseline is 1.7 4. Expected post operative anemia, will continue iron 5. Dispo- patient stable, will d/c home today   LOS: 6 days    Ellwood Handler 09/28/2018

## 2018-09-29 LAB — AEROBIC/ANAEROBIC CULTURE W GRAM STAIN (SURGICAL/DEEP WOUND): Culture: NO GROWTH

## 2018-09-29 LAB — AEROBIC/ANAEROBIC CULTURE (SURGICAL/DEEP WOUND)

## 2018-10-01 ENCOUNTER — Ambulatory Visit (INDEPENDENT_AMBULATORY_CARE_PROVIDER_SITE_OTHER): Payer: PPO | Admitting: Internal Medicine

## 2018-10-01 ENCOUNTER — Encounter: Payer: Self-pay | Admitting: Internal Medicine

## 2018-10-01 ENCOUNTER — Ambulatory Visit: Payer: PPO

## 2018-10-01 VITALS — BP 126/60 | HR 80 | Ht 69.5 in | Wt 216.5 lb

## 2018-10-01 DIAGNOSIS — I442 Atrioventricular block, complete: Secondary | ICD-10-CM

## 2018-10-01 DIAGNOSIS — Z950 Presence of cardiac pacemaker: Secondary | ICD-10-CM

## 2018-10-01 DIAGNOSIS — I255 Ischemic cardiomyopathy: Secondary | ICD-10-CM | POA: Diagnosis not present

## 2018-10-01 MED ORDER — SPIRONOLACTONE 25 MG PO TABS: 12.5000 mg | ORAL_TABLET | Freq: Every day | ORAL | 6 refills | Status: DC

## 2018-10-01 MED ORDER — METOPROLOL SUCCINATE ER 25 MG PO TB24: 25.0000 mg | ORAL_TABLET | Freq: Every day | ORAL | 11 refills | Status: DC

## 2018-10-01 MED ORDER — METOPROLOL SUCCINATE ER 50 MG PO TB24: 50.0000 mg | ORAL_TABLET | Freq: Every day | ORAL | 3 refills | Status: DC

## 2018-10-01 NOTE — Progress Notes (Signed)
Patient Care Team: [name], MD as PCP - General (Family Medicine)   HPI  Sean [name] is a 77 y.o. male Seen in followup for His pacing complicated lead induced PVCs initially at >9 %    No  EF assessment prior to device implantation   DATE TEST EF   9/15 Myoview 55-65% No ischemia  10/18 Echo   50-55 %   5/19 Echo   30-35 %   7/19 CTA  Prob 3 V CAD  7/19 LHC  3V CAD    Post cath he developed a left upper extremity DVT and was started on Eliquis >> this resulted in deferral of his plan for bypass then done early September with placing of a LV epicardial lead  He is also had problems with orthostatic intolerance requiring down titration of medications.  11/19 he had a near syncopal episode I   Imaging demonstrated a large pneumothorax attributed per the family to a slow leak following his bypass surgery.  He was transferred to [facility] and underwent VATS for drainage and decortication.  He has had problems with peripheral edema.  He is not on diuretics.   Records and Results Reviewed   Past Medical History:  Diagnosis Date  . Arthritis   . Asthma   . CAD (coronary artery disease)    a. LHC 7/19: ostLM 30%, ostLAD 30%, p-mLAD 99% mod calcified, ostD1 90%, ost-pLCx 80% ulcerative & mod calcified, p-mLCx 50%, OM2 50%, mod dz LPDA, RCA small w/ diffuse dz throughout; b. recommendation of CABG; c. 3-V CABG 08/26/18 (LIMA-LAD, VG-Diag, VG-LCx)  . Chronic systolic CHF (congestive heart failure) (HCC)    a. EF 35-40%, diffuse HK with HK of the apical, anteroseptal, and anterior myocardium, Gr1DD, mild MR, mildly dilated LA, pacer wire noted in the RV with nl RVSF, PASP nl  . CKD (chronic kidney disease), stage III ([place])   . Deep vein thrombosis (DVT) of upper extremity ([place])    a. DVT of left subclavian dx'd 06/17/2018; b. Eliquis  . GERD (gastroesophageal reflux disease)    takes otc occasionally  . History of complete heart block    a. s/p MDT PPM 05/2017  .  Hypertension   . Obesity   . Presence of permanent cardiac pacemaker    Dr. Beckie Salts implanted 05/2017  . PVC's (premature ventricular contractions)    a. PPM-induced    Past Surgical History:  Procedure Laterality Date  . BACK SURGERY    . CORONARY ARTERY BYPASS GRAFT N/A 08/26/2018   Procedure: CORONARY ARTERY BYPASS GRAFTING (CABG) x3 , Using left internal mammory artery and right leg greater saphronious vein. Harvested endoscopically.;  Surgeon: Ivin Poot, MD;  Location: [facility];  Service: Open Heart Surgery;  Laterality: N/A;  . EPICARDIAL PACING LEAD PLACEMENT N/A 08/26/2018   Procedure: LV PACING LEAD PLACEMENT;  Surgeon: Ivin Poot, MD;  Location: [facility];  Service: Thoracic;  Laterality: N/A;  . INSERT / REPLACE / REMOVE PACEMAKER     MEDTRONIC  . LEFT HEART CATH AND CORONARY ANGIOGRAPHY N/A 06/13/2018   Procedure: LEFT HEART CATH AND CORONARY ANGIOGRAPHY;  Surgeon: Wellington Hampshire, MD;  Location: [facility];  Service: Cardiovascular;  Laterality: N/A;  . LEG SURGERY    . PACEMAKER IMPLANT N/A 06/11/2017   Procedure: Pacemaker Implant;  Surgeon: Evans Lance, MD;  Location: [facility];  Service: Cardiovascular;  Laterality: N/A;  . PLEURAL EFFUSION DRAINAGE Left  09/24/2018   Procedure: DRAINAGE OF LOCULATED PLEURAL EFFUSION;  Surgeon: Ivin Poot, MD;  Location: [facility];  Service: Thoracic;  Laterality: Left;  . TEE WITHOUT CARDIOVERSION N/A 08/26/2018   Procedure: TRANSESOPHAGEAL ECHOCARDIOGRAM (TEE);  Surgeon: Prescott Gum, Collier Salina, MD;  Location: [facility];  Service: Open Heart Surgery;  Laterality: N/A;  . VIDEO ASSISTED THORACOSCOPY Left 09/24/2018   Procedure: VIDEO ASSISTED THORACOSCOPY;  Surgeon: Prescott Gum, Collier Salina, MD;  Location: [facility] OR;  Service: Thoracic;  Laterality: Left;    Current Outpatient Medications  Medication Sig Dispense Refill  . albuterol (PROVENTIL HFA;VENTOLIN HFA) 108 (90 Base) MCG/ACT inhaler Inhale 1-2 puffs into the lungs every 4  (four) hours as needed for wheezing or shortness of breath.    . allopurinol (ZYLOPRIM) 100 MG tablet Take 100 mg by mouth 2 (two) times daily.    Marland Kitchen aspirin EC 81 MG tablet Take 81 mg by mouth daily.    Marland Kitchen atorvastatin (LIPITOR) 40 MG tablet Take 1 tablet (40 mg total) by mouth daily. 90 tablet 3  . ferrous sulfate 325 (65 FE) MG EC tablet Take 1 tablet (325 mg total) by mouth 2 (two) times daily with a meal. 60 tablet 0  . Fluticasone-Salmeterol (ADVAIR) 100-50 MCG/DOSE AEPB Inhale 1 puff into the lungs daily as needed (shortness of breath). 60 each   . metoprolol succinate (TOPROL-XL) 50 MG 24 hr tablet Take 1 tablet (50 mg total) by mouth daily. 90 tablet 3  . Multiple Vitamin (MULTIVITAMIN) tablet Take 1 tablet by mouth daily.    Marland Kitchen Specialty Vitamins Products (PROSTATE PO) Take 1 tablet by mouth daily.    . traMADol (ULTRAM) 50 MG tablet Take 1 tablet (50 mg total) by mouth every 6 (six) hours as needed (mild pain). 28 tablet 0   No current facility-administered medications for this visit.     Allergies  Allergen Reactions  . Azithromycin Hives  . Erythromycin Hives      Review of Systems negative except from HPI and PMH  Physical Exam BP 126/60 (BP Location: Right Arm, Patient Position: Sitting, Cuff Size: Normal)   Pulse 80   Ht 5' 9.5" (1.765 m)   Wt 216 lb 8 oz (98.2 kg)   BMI 31.51 kg/m  Well developed and nourished in no acute distress HENT normal Neck supple with JVP-flat Clear Regular rate and rhythm, no murmurs or gallops Abd-soft with active BS No Clubbing cyanosis edema Skin-warm and dry A & Oriented  Grossly normal sensory and motor function    ECG P synchronous pacing with a QRS duration of 172 ms    Assessment and  Plan   Complete heart block  Cardiomyopathy-ischemic with  bypass  PVCs-presumably lead related now quiescient  Pacemaker-His-Medtronic  Hypertension  Orthostatic hypotension  He is intercurrently undergone bypass surgery with  placing of an LV lead and complicated by hemothorax.  Mildly volume overloaded.  We will begin him on Aldactone 12.5 mg.  We will need to follow his metabolic profile.  We will decrease his metoprolol from 50--25.  We will plan to reassess his LV function in about 6 weeks.  He has an LV lead which we could potentially utilize if LV dysfunction persists  Will arrange long term comanagement with Dr MA

## 2018-10-01 NOTE — Patient Instructions (Addendum)
Medication Instructions:  - Your physician has recommended you make the following change in your medication:   1) DECREASE toprol (metoprolol succinate) to 25 mg- take 1 tablet by mouth once daily  2) START aldactone (spironolactone) 25 mg- take 1/2 tablet (12.5 mg) by mouth once daily  If you need a refill on your cardiac medications before your next appointment, please call your pharmacy.   Lab work: - Your physician recommends that you return for lab work in: 2 weeks (BMP)  If you have labs (blood work) drawn today and your tests are completely normal, you will receive your results only by: Marland Kitchen MyChart Message (if you have MyChart) OR . A paper copy in the mail If you have any lab test that is abnormal or we need to change your treatment, we will call you to review the results.  Testing/Procedures: - Your physician has requested that you have an echocardiogram- in 6 weeks (same day of PA appointment) Echocardiography is a painless test that uses sound waves to create images of your heart. It provides your doctor with information about the size and shape of your heart and how well your heart's chambers and valves are working. This procedure takes approximately one hour. There are no restrictions for this procedure.  Follow-Up: At [facility], you and your health needs are our priority.  As part of our continuing mission to provide you with exceptional heart care, we have created designated Provider Care Teams.  These Care Teams include your primary Cardiologist (physician) and Advanced Practice Providers (APPs -  Physician Assistants and Nurse Practitioners) who all work together to provide you with the care you need, when you need it. . You will need a follow up appointment in 6 months with Dr. Caryl Comes.  Please call our office 2 months in advance to schedule this appointment.    Remote monitoring is used to monitor your Pacemaker of ICD from home. This monitoring reduces the number of  office visits required to check your device to one time per year. It allows [place] to keep an eye on the functioning of your device to ensure it is working properly. You are scheduled for a device check from home on 10/30/18. You may send your transmission at any time that day. If you have a wireless device, the transmission will be sent automatically. After your physician reviews your transmission, you will receive a postcard with your next transmission date.  - follow up in 6 weeks with Christell Faith, PA  Any Other Special Instructions Will Be Listed Below (If Applicable). - N/A

## 2018-10-02 ENCOUNTER — Ambulatory Visit: Payer: PPO | Admitting: Cardiothoracic Surgery

## 2018-10-04 ENCOUNTER — Other Ambulatory Visit: Payer: Self-pay | Admitting: Cardiothoracic Surgery

## 2018-10-04 DIAGNOSIS — Z951 Presence of aortocoronary bypass graft: Secondary | ICD-10-CM

## 2018-10-07 ENCOUNTER — Other Ambulatory Visit: Payer: Self-pay

## 2018-10-07 ENCOUNTER — Ambulatory Visit (INDEPENDENT_AMBULATORY_CARE_PROVIDER_SITE_OTHER): Payer: Self-pay | Admitting: Physician Assistant

## 2018-10-07 ENCOUNTER — Encounter: Payer: Self-pay | Admitting: Physician Assistant

## 2018-10-07 ENCOUNTER — Ambulatory Visit
Admission: RE | Admit: 2018-10-07 | Discharge: 2018-10-07 | Disposition: A | Discharge status: Home / Self Care | Payer: PPO | Source: Ambulatory Visit | Attending: Cardiothoracic Surgery | Admitting: Cardiothoracic Surgery

## 2018-10-07 VITALS — BP 140/80 | HR 79 | Resp 18 | Ht 69.5 in | Wt 209.4 lb

## 2018-10-07 DIAGNOSIS — Z951 Presence of aortocoronary bypass graft: Secondary | ICD-10-CM

## 2018-10-07 DIAGNOSIS — J9811 Atelectasis: Secondary | ICD-10-CM | POA: Diagnosis not present

## 2018-10-07 NOTE — Patient Instructions (Addendum)
Provided he is no longer taking narcotics for pain, he was instructed he may begin driving (short distances, no more than 30 minutes through the rest of the Week). He is to not lift more than 10 pounds for at least 2-3 more weeks.   You are encouraged to enroll and participate in the outpatient cardiac rehab program beginning as soon as practical  Please follow up with your medical doctor regarding pre diabetes as pre op HGA1C was 6

## 2018-10-07 NOTE — Progress Notes (Signed)
HPI:  Patient returns for routine postoperative follow-up having undergone a left VATS and drain hemothorax by Dr. Prescott Gum on 09/24/2018. Previously, he had a CABG x 3 by Dr. Prescott Gum on 08/26/2018. The patient's early postoperative recovery from most recent surgery was notable for elevated creatinine but patient has a history of CKD (stage III). His creatinine at discharge was similar to his baseline creatinine. Since being discharged, the patient denies shortness of breath or chest pain. He saw Dr. Caryl Comes last week and was put on Spironolactone 12.5 mg daily and Toprol XL was decreased to 25 mg daily.  Current Outpatient Medications  Medication Sig Dispense Refill  . albuterol (PROVENTIL HFA;VENTOLIN HFA) 108 (90 Base) MCG/ACT inhaler Inhale 1-2 puffs into the lungs every 4 (four) hours as needed for wheezing or shortness of breath.    . allopurinol (ZYLOPRIM) 100 MG tablet Take 100 mg by mouth 2 (two) times daily.    Marland Kitchen aspirin EC 81 MG tablet Take 81 mg by mouth daily.    Marland Kitchen atorvastatin (LIPITOR) 40 MG tablet Take 1 tablet (40 mg total) by mouth daily. 90 tablet 3  . ferrous sulfate 325 (65 FE) MG EC tablet Take 1 tablet (325 mg total) by mouth 2 (two) times daily with a meal. 60 tablet 0  . Fluticasone-Salmeterol (ADVAIR) 100-50 MCG/DOSE AEPB Inhale 1 puff into the lungs daily as needed (shortness of breath). 60 each   . metoprolol succinate (TOPROL-XL) 25 MG 24 hr tablet Take 1 tablet (25 mg total) by mouth daily. 30 tablet 11  . Multiple Vitamin (MULTIVITAMIN) tablet Take 1 tablet by mouth daily.    Marland Kitchen Specialty Vitamins Products (PROSTATE PO) Take 1 tablet by mouth daily.    Marland Kitchen spironolactone (ALDACTONE) 25 MG tablet Take 0.5 tablets (12.5 mg total) by mouth daily. 30 tablet 6  . traMADol (ULTRAM) 50 MG tablet Take 1 tablet (50 mg total) by mouth every 6 (six) hours as needed (mild pain). 28 tablet 0  Vital Signs: BP 140/80, HR 79,  RR 18,  Oxygenation 93% on room air  Physical  Exam: CV-RRR Pulmonary-Clear to auscultation bilaterally Extremities-Trace LE edema Wounds-Clean and dry  Diagnostic Tests: CLINICAL DATA:  Status post CABG x3 with left lung decortication.  EXAM: CHEST - 2 VIEW  COMPARISON:  09/27/2018  FINDINGS: Top-normal heart size with minimal aortic atherosclerosis. Status post median sternotomy with left-sided pacemaker apparatus and leads in place. Resolution of left base atelectasis since prior apart from minimal subsegmental atelectasis near the diaphragmatic pleura. Minimal blunting the left lateral posterior costophrenic angles.  Degenerative disc disease of the thoracic spine.  IMPRESSION: 1. Status post median sternotomy with left-sided pacemaker apparatus and leads in place. 2. No active pulmonary disease. 3. Minimal blunting of the left lateral and posterior costophrenic angles may reflect trace pleural effusion or pleural thickening.   Electronically Signed   By: Ashley Royalty M.D.   On: 10/07/2018 15:13  Impression and Plan: Overall, Sean [name] is recovering well from left VATS and drainage of hemothorax and CABG x 3. Provided he is no longer taking narcotics for pain, he was instructed he may begin driving (short distances, no more than 30 minutes through the rest of the Week). He is to not lift more than 10 pounds for at least 2-3 more weeks. We will make a referral to cardiac rehab in [place] for him. His pre op HGA1C was 6 and he likely has pre diabetes. He has a scheduled physical with medical  doctor and was told to follow up with him then. He will return to see Dr. Prescott Gum in 4 weeks. He has an appointment to see the physician assistant from cardiology at the end of next month.    Nani Skillern, PA-C [facility] [phone]

## 2018-10-08 ENCOUNTER — Other Ambulatory Visit: Payer: Self-pay

## 2018-10-15 LAB — CULTURE, FUNGUS WITHOUT SMEAR

## 2018-10-16 ENCOUNTER — Ambulatory Visit: Payer: PPO | Admitting: Cardiothoracic Surgery

## 2018-10-16 ENCOUNTER — Other Ambulatory Visit (INDEPENDENT_AMBULATORY_CARE_PROVIDER_SITE_OTHER): Payer: PPO | Admitting: *Deleted

## 2018-10-16 DIAGNOSIS — I255 Ischemic cardiomyopathy: Secondary | ICD-10-CM

## 2018-10-16 DIAGNOSIS — I442 Atrioventricular block, complete: Secondary | ICD-10-CM | POA: Diagnosis not present

## 2018-10-16 DIAGNOSIS — Z950 Presence of cardiac pacemaker: Secondary | ICD-10-CM

## 2018-10-17 LAB — BASIC METABOLIC PANEL
BUN/Creatinine Ratio: 18 (ref 10–24)
BUN: 29 mg/dL — ABNORMAL HIGH (ref 8–27)
CO2: 26 mmol/L (ref 20–29)
Calcium: 9.6 mg/dL (ref 8.6–10.2)
Chloride: 104 mmol/L (ref 96–106)
Creatinine, Ser: 1.62 mg/dL — ABNORMAL HIGH (ref 0.76–1.27)
GFR calc Af Amer: 47 mL/min/{1.73_m2} — ABNORMAL LOW (ref >59)
GFR calc non Af Amer: 41 mL/min/{1.73_m2} — ABNORMAL LOW (ref >59)
Glucose: 122 mg/dL — ABNORMAL HIGH (ref 65–99)
Potassium: 4.9 mmol/L (ref 3.5–5.2)
Sodium: 144 mmol/L (ref 134–144)

## 2018-10-28 ENCOUNTER — Other Ambulatory Visit: Payer: Self-pay

## 2018-10-28 NOTE — Patient Outreach (Signed)
[facility]) Care Management  [place]   10/28/2018  ELMORE HYSLOP 1941/05/17 [id number]   Reason for referral: discharge medication review  Current insurance:HTA  PMHx:  Complete heart block, acute CHF, coronary artery disease, s/p CABG x 3 and gout, recent drainage of left pleural effusion  HPI:  Mr. Seder states that he feels well.  He states he has a cardiology appointment on Wednesday.  He reports no medication affordability issues.   Objective: Lab Results  Component Value Date   CREATININE 1.62 (H) 10/16/2018   CREATININE 1.70 (H) 09/27/2018   CREATININE 1.56 (H) 09/26/2018    Lab Results  Component Value Date   HGBA1C 6.0 (H) 08/22/2018    Lipid Panel     Component Value Date/Time   CHOL 116 06/26/2018 0605   TRIG 64 06/26/2018 0605   HDL 43 06/26/2018 0605   CHOLHDL 2.7 06/26/2018 0605   VLDL 13 06/26/2018 0605   LDLCALC 60 06/26/2018 0605    BP Readings from Last 3 Encounters:  10/07/18 140/80  10/01/18 126/60  09/28/18 139/79    Allergies  Allergen Reactions  . Azithromycin Hives  . Erythromycin Hives    Medications Reviewed Today    Reviewed by Dionne Milo, [facility] (Pharmacist) on 10/28/18 at 1211  Med List Status: <None>  Medication Order Taking? Sig Documenting Provider Last Dose Status Informant  albuterol (PROVENTIL HFA;VENTOLIN HFA) 108 (90 Base) MCG/ACT inhaler [id number] No Inhale 1-2 puffs into the lungs every 4 (four) hours as needed for wheezing or shortness of breath. [provider] Not Taking Active Self  allopurinol (ZYLOPRIM) 100 MG tablet [id number] Yes Take 100 mg by mouth 2 (two) times daily. Pt has changed his own dose to once daily. [provider] Taking Active Self  aspirin EC 81 MG tablet [id number] Yes Take 81 mg by mouth daily. [provider] Taking Active Self  atorvastatin (LIPITOR) 40 MG tablet [id number]  Take 1 tablet (40 mg total) by mouth daily.  Patient  taking differently:  Take 40 mg by mouth daily. Taking-rx valid through 7/20   Wellington Hampshire, MD  Expired 10/01/18 2359 Self  Fluticasone-Salmeterol (ADVAIR) 100-50 MCG/DOSE AEPB [id number] No Inhale 1 puff into the lungs daily as needed (shortness of breath).  Patient not taking:  Reported on 10/28/2018   Nani Skillern, PA-C Not Taking Active Self  metoprolol succinate (TOPROL-XL) 25 MG 24 hr tablet [id number] Yes Take 1 tablet (25 mg total) by mouth daily. Deboraha Sprang, MD Taking Active   Multiple Vitamin (MULTIVITAMIN) tablet [id number] Yes Take 1 tablet by mouth daily. [provider] Taking Active Self  Specialty Vitamins Products (PROSTATE PO) [id number] Yes Take 1 tablet by mouth daily. [provider] Taking Active Self  spironolactone (ALDACTONE) 25 MG tablet [id number] Yes Take 0.5 tablets (12.5 mg total) by mouth daily. Deboraha Sprang, MD Taking Active          ASSESSMENT: Date Discharged from Hospital: 09/28/18 Date Medication Reconciliation Performed: 10/28/2018  Medications Discontinued at Discharge:   warfarin  New Medications at Discharge:   Spironolactone started on 10/01/18  Medications with Dose Adjustments at Discharge: . Tramadol (discontinued as patient preference)  Patient was recently discharged from hospital and all medications have been reviewed.  Drugs sorted by system:  Cardiovascular: aspirin 81 mg, atorvastatin, metoprolol succinate, spironolactone  Pulmonary/Allergy: albuterol MDI, fluticasone/salmeterol MDI  Vitamins/Minerals/Supplements: MVI, specialty MVI for prostate  Miscellaneous: allopurinol  Medication Review Findings:  .  MDIs- patient reports very infrequent use of both albuterol and Advair MDI.  Marland Kitchen Allopurinol- patient states that he self decreased his dose ~ 2 months ago to see if one pill daily would work as well as twice daily.  He reports that he has not had a gout flare and does not want to increase to  twice daily as prescribed.  PLAN: -Instructed patient to take new medications as prescribed and discontinue old medications as prescribed   Route note to PCP, Dr. Kary Kos.  Joetta Manners, PharmD Clinical Pharmacist [place] [phone]

## 2018-10-30 ENCOUNTER — Encounter: Payer: Self-pay | Admitting: Cardiothoracic Surgery

## 2018-10-30 ENCOUNTER — Ambulatory Visit: Payer: Self-pay | Admitting: Cardiothoracic Surgery

## 2018-10-30 ENCOUNTER — Ambulatory Visit (INDEPENDENT_AMBULATORY_CARE_PROVIDER_SITE_OTHER): Payer: PPO

## 2018-10-30 ENCOUNTER — Other Ambulatory Visit: Payer: Self-pay

## 2018-10-30 ENCOUNTER — Other Ambulatory Visit: Payer: Self-pay | Admitting: *Deleted

## 2018-10-30 VITALS — BP 120/76 | HR 80 | Resp 16 | Ht 69.5 in | Wt 212.0 lb

## 2018-10-30 DIAGNOSIS — I2510 Atherosclerotic heart disease of native coronary artery without angina pectoris: Secondary | ICD-10-CM

## 2018-10-30 DIAGNOSIS — Z09 Encounter for follow-up examination after completed treatment for conditions other than malignant neoplasm: Secondary | ICD-10-CM

## 2018-10-30 DIAGNOSIS — Z951 Presence of aortocoronary bypass graft: Secondary | ICD-10-CM

## 2018-10-30 DIAGNOSIS — J90 Pleural effusion, not elsewhere classified: Secondary | ICD-10-CM

## 2018-10-30 DIAGNOSIS — I442 Atrioventricular block, complete: Secondary | ICD-10-CM | POA: Diagnosis not present

## 2018-10-30 DIAGNOSIS — R001 Bradycardia, unspecified: Secondary | ICD-10-CM

## 2018-10-30 NOTE — Progress Notes (Signed)
PCP is [name], MD Referring Provider is Wellington Hampshire, MD  Chief Complaint  Patient presents with  . Routine Post Op    4 wk f/u s/p L VATS/DR. 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 No symptoms of angina, CHF Last EF was approximately 30% and his postop echo is pending. Sternal incision is healing well. Epicardial lead placed in the pocket close to his permanent pacemaker pocket starting to show pressure against the skin and will need to be revised.  Lead is still below the dermis. Past Medical History:  Diagnosis Date  . Arthritis   . Asthma   . CAD (coronary artery disease)    a. LHC 7/19: ostLM 30%, ostLAD 30%, p-mLAD 99% mod calcified, ostD1 90%, ost-pLCx 80% ulcerative & mod calcified, p-mLCx 50%, OM2 50%, mod dz LPDA, RCA small w/ diffuse dz throughout; b. recommendation of CABG; c. 3-V CABG 08/26/18 (LIMA-LAD, VG-Diag, VG-LCx)  . Chronic systolic CHF (congestive heart failure) (HCC)    a. EF 35-40%, diffuse HK with HK of the apical, anteroseptal, and anterior myocardium, Gr1DD, mild MR, mildly dilated LA, pacer wire noted in the RV with nl RVSF, PASP nl  . CKD (chronic kidney disease), stage III ([place])   . Deep vein thrombosis (DVT) of upper extremity ([place])    a. DVT of left subclavian dx'd 06/17/2018; b. Eliquis  . GERD (gastroesophageal reflux disease)    takes otc occasionally  . History of complete heart block    a. s/p MDT PPM 05/2017  . Hypertension   . Obesity   . Presence of permanent cardiac pacemaker    Dr. Beckie Salts implanted 05/2017  . PVC's (premature ventricular contractions)    a. PPM-induced    Past Surgical History:  Procedure  Laterality Date  . BACK SURGERY    . CORONARY ARTERY BYPASS GRAFT N/A 08/26/2018   Procedure: CORONARY ARTERY BYPASS GRAFTING (CABG) x3 , Using left internal mammory artery and right leg greater saphronious vein. Harvested endoscopically.;  Surgeon: Ivin Poot, MD;  Location: [place];  Service: Open Heart Surgery;  Laterality: N/A;  . EPICARDIAL PACING LEAD PLACEMENT N/A 08/26/2018   Procedure: LV PACING LEAD PLACEMENT;  Surgeon: Ivin Poot, MD;  Location: [place];  Service: Thoracic;  Laterality: N/A;  . INSERT / REPLACE / REMOVE PACEMAKER     [employer]  . LEFT HEART CATH AND CORONARY ANGIOGRAPHY N/A 06/13/2018   Procedure: LEFT HEART CATH AND CORONARY ANGIOGRAPHY;  Surgeon: Wellington Hampshire, MD;  Location: [place] [facility];  Service: Cardiovascular;  Laterality: N/A;  . LEG SURGERY    . PACEMAKER IMPLANT N/A 06/11/2017   Procedure: Pacemaker Implant;  Surgeon: Evans Lance, MD;  Location: [place] [facility];  Service: Cardiovascular;  Laterality: N/A;  . PLEURAL EFFUSION DRAINAGE Left 09/24/2018   Procedure: DRAINAGE OF LOCULATED PLEURAL EFFUSION;  Surgeon: Ivin Poot, MD;  Location: [place];  Service: Thoracic;  Laterality: Left;  . TEE WITHOUT CARDIOVERSION N/A 08/26/2018   Procedure: TRANSESOPHAGEAL ECHOCARDIOGRAM (TEE);  Surgeon: Prescott Gum, Collier Salina, MD;  Location: [place];  Service: Open Heart Surgery;  Laterality: N/A;  . VIDEO ASSISTED THORACOSCOPY Left 09/24/2018   Procedure: VIDEO  ASSISTED THORACOSCOPY;  Surgeon: Prescott Gum, Collier Salina, MD;  Location: [facility];  Service: Thoracic;  Laterality: Left;    Family History  Problem Relation Age of Onset  . Stroke Mother   . Stroke Father   . Alcohol abuse Father   . Diabetes Brother   . Alcohol abuse Brother   . Stroke Maternal Grandfather     Social History Social History   Tobacco Use  . Smoking status: Never Smoker  . Smokeless tobacco: Never Used  Substance Use Topics  . Alcohol use: Yes    Comment: occ.  . Drug use:  No    Current Outpatient Medications  Medication Sig Dispense Refill  . albuterol (PROVENTIL HFA;VENTOLIN HFA) 108 (90 Base) MCG/ACT inhaler Inhale 1-2 puffs into the lungs every 4 (four) hours as needed for wheezing or shortness of breath.    . allopurinol (ZYLOPRIM) 100 MG tablet Take 100 mg by mouth 2 (two) times daily. Pt has changed his own dose to once daily.    Marland Kitchen aspirin EC 81 MG tablet Take 81 mg by mouth daily.    Marland Kitchen atorvastatin (LIPITOR) 40 MG tablet Take 1 tablet (40 mg total) by mouth daily. (Patient taking differently: Take 40 mg by mouth daily. Taking-rx valid through 7/20) 90 tablet 3  . Fluticasone-Salmeterol (ADVAIR) 100-50 MCG/DOSE AEPB Inhale 1 puff into the lungs daily as needed (shortness of breath). 60 each   . metoprolol succinate (TOPROL-XL) 25 MG 24 hr tablet Take 1 tablet (25 mg total) by mouth daily. 30 tablet 11  . Multiple Vitamin (MULTIVITAMIN) tablet Take 1 tablet by mouth daily.    Marland Kitchen Specialty Vitamins Products (PROSTATE PO) Take 1 tablet by mouth daily.    Marland Kitchen spironolactone (ALDACTONE) 25 MG tablet Take 0.5 tablets (12.5 mg total) by mouth daily. 30 tablet 6   No current facility-administered medications for this visit.     Allergies  Allergen Reactions  . Azithromycin Hives  . Erythromycin Hives    Review of Systems  No fever No syncope No shortness of breath No chest pain No difficulty swallowing No ankle edema Complains of nocturia, urgency  BP 120/76 (BP Location: Right Arm, Patient Position: Sitting, Cuff Size: Large)   Pulse 80   Resp 16   Ht 5' 9.5" (1.765 m)   Wt 212 lb (96.2 kg)   SpO2 93% Comment: ON RA  BMI 30.86 kg/m  Physical Exam      Exam    General- alert and comfortable    Neck- no JVD, no cervical adenopathy palpable, no carotid bruit   Lungs- clear without rales, wheezes Chest -well-healed sternal incision.  Epicardial pacing lead showing outward pressure against the dermis with dermis intact   Cor- regular rate and  rhythm, no murmur , gallop   Abdomen- soft, non-tender   Extremities - warm, non-tender, minimal edema   Neuro- oriented, appropriate, no focal weakness   Diagnostic Tests: None  Impression: Patient recovered from CABG and sternal precautions will be lifted after January 1. I am concerned about the LV epicardial lead which was placed in case his ejection fraction does not improve.  If we will need to be revised in the operating room.  This will be scheduled for Thursday, December 19 at [place]: I discussed the procedure of revision of pacemaker lead including use of anesthesia, the location incision, and the plan for this to be an outpatient procedure with the patient and wife.  They understand the risks of bleeding,  infection, and discomfort.    Len Childs, MD [facility] [phone]

## 2018-10-31 ENCOUNTER — Other Ambulatory Visit: Payer: Self-pay | Admitting: *Deleted

## 2018-10-31 DIAGNOSIS — Z951 Presence of aortocoronary bypass graft: Secondary | ICD-10-CM

## 2018-11-01 ENCOUNTER — Encounter: Payer: Self-pay | Admitting: Cardiology

## 2018-11-01 NOTE — Progress Notes (Signed)
Remote pacemaker transmission.   

## 2018-11-04 NOTE — Pre-Procedure Instructions (Signed)
Sean [name]  11/04/2018      [facility] - [place], [place] - [address] [place] [address] Phone: [phone] Fax: [phone]    Your procedure is scheduled on Thursday December 19th.  Report to [facility] Admitting at 12:15 PM  Call this number if you have problems the morning of surgery:  [phone]   Remember:  Do not eat or drink after midnight.     Take these medicines the morning of surgery with A SIP OF WATER  Fluticasone-Salmeterol (ADVAIR)  metoprolol succinate (TOPROL-XL)  allopurinol (ZYLOPRIM) albuterol (PROVENTIL HFA;VENTOLIN HFA) -if needed *bring with you to hospital  Follow your surgeon's instructions on when to stop Asprin.  If no instructions were given by your surgeon then you will need to call the office to get those instructions.    7 days prior to surgery STOP taking any Aspirin(unless otherwise instructed by your surgeon), Aleve, Naproxen, Ibuprofen, Motrin, Advil, Goody's, BC's, all herbal medications, fish oil, and all vitamins    Do not wear jewelry.  Do not wear lotions, powders, or colognes, or deodorant.  Do not shave 48 hours prior to surgery.  Men may shave face and neck.  Do not bring valuables to the hospital.  [facility] is not responsible for any belongings or valuables.  Contacts, eyeglasses, hearing aids dentures or bridgework may not be worn into surgery.  Leave your suitcase in the car.  After surgery it may be brought to your room.  For patients admitted to the hospital, discharge time will be determined by your treatment team.  Patients discharged the day of surgery will not be allowed to drive home.   [facility]- Preparing For Surgery  Before surgery, you can play an important role. Because skin is not sterile, your skin needs to be as free of germs as possible. You can reduce the number of germs on your skin by washing with CHG (chlorahexidine gluconate) Soap before surgery.   CHG is an antiseptic cleaner which kills germs and bonds with the skin to continue killing germs even after washing.    Oral Hygiene is also important to reduce your risk of infection.  Remember - BRUSH YOUR TEETH THE MORNING OF SURGERY WITH YOUR REGULAR TOOTHPASTE  Please do not use if you have an allergy to CHG or antibacterial soaps. If your skin becomes reddened/irritated stop using the CHG.  Do not shave (including legs and underarms) for at least 48 hours prior to first CHG shower. It is OK to shave your face.  Please follow these instructions carefully.   1. Shower the NIGHT BEFORE SURGERY and the MORNING OF SURGERY with CHG.   2. If you chose to wash your hair, wash your hair first as usual with your normal shampoo.  3. After you shampoo, rinse your hair and body thoroughly to remove the shampoo.  4. Use CHG as you would any other liquid soap. You can apply CHG directly to the skin and wash gently with a scrungie or a clean washcloth.   5. Apply the CHG Soap to your body ONLY FROM THE NECK DOWN.  Do not use on open wounds or open sores. Avoid contact with your eyes, ears, mouth and genitals (private parts). Wash Face and genitals (private parts)  with your normal soap.  6. Wash thoroughly, paying special attention to the area where your surgery will be performed.  7. Thoroughly rinse your body with warm water  from the neck down.  8. DO NOT shower/wash with your normal soap after using and rinsing off the CHG Soap.  9. Pat yourself dry with a CLEAN TOWEL.  10. Wear CLEAN PAJAMAS to bed the night before surgery, wear comfortable clothes the morning of surgery  11. Place CLEAN SHEETS on your bed the night of your first shower and DO NOT SLEEP WITH PETS.    Day of Surgery: Shower as stated above. Do not apply any deodorants/lotions.  Please wear clean clothes to the hospital/surgery center.   Remember to brush your teeth WITH YOUR REGULAR TOOTHPASTE.

## 2018-11-05 ENCOUNTER — Other Ambulatory Visit: Payer: Self-pay

## 2018-11-05 ENCOUNTER — Encounter (HOSPITAL_COMMUNITY)
Admission: RE | Admit: 2018-11-05 | Discharge: 2018-11-05 | Disposition: A | Discharge status: Home / Self Care | Payer: PPO | Source: Ambulatory Visit | Attending: Cardiothoracic Surgery | Admitting: Cardiothoracic Surgery

## 2018-11-05 ENCOUNTER — Encounter (HOSPITAL_COMMUNITY): Payer: Self-pay

## 2018-11-05 DIAGNOSIS — Z955 Presence of coronary angioplasty implant and graft: Secondary | ICD-10-CM | POA: Diagnosis not present

## 2018-11-05 DIAGNOSIS — Z01818 Encounter for other preprocedural examination: Secondary | ICD-10-CM

## 2018-11-05 DIAGNOSIS — Z951 Presence of aortocoronary bypass graft: Secondary | ICD-10-CM | POA: Insufficient documentation

## 2018-11-05 DIAGNOSIS — R9431 Abnormal electrocardiogram [ECG] [EKG]: Secondary | ICD-10-CM

## 2018-11-05 DIAGNOSIS — I130 Hypertensive heart and chronic kidney disease with heart failure and stage 1 through stage 4 chronic kidney disease, or unspecified chronic kidney disease: Secondary | ICD-10-CM | POA: Diagnosis not present

## 2018-11-05 DIAGNOSIS — M1990 Unspecified osteoarthritis, unspecified site: Secondary | ICD-10-CM | POA: Diagnosis not present

## 2018-11-05 DIAGNOSIS — Z86718 Personal history of other venous thrombosis and embolism: Secondary | ICD-10-CM | POA: Diagnosis not present

## 2018-11-05 DIAGNOSIS — Y831 Surgical operation with implant of artificial internal device as the cause of abnormal reaction of the patient, or of later complication, without mention of misadventure at the time of the procedure: Secondary | ICD-10-CM | POA: Diagnosis not present

## 2018-11-05 DIAGNOSIS — K219 Gastro-esophageal reflux disease without esophagitis: Secondary | ICD-10-CM | POA: Diagnosis not present

## 2018-11-05 DIAGNOSIS — I5022 Chronic systolic (congestive) heart failure: Secondary | ICD-10-CM | POA: Diagnosis not present

## 2018-11-05 DIAGNOSIS — Z6830 Body mass index (BMI) 30.0-30.9, adult: Secondary | ICD-10-CM | POA: Diagnosis not present

## 2018-11-05 DIAGNOSIS — T82190A Other mechanical complication of cardiac electrode, initial encounter: Secondary | ICD-10-CM | POA: Diagnosis not present

## 2018-11-05 DIAGNOSIS — I2510 Atherosclerotic heart disease of native coronary artery without angina pectoris: Secondary | ICD-10-CM | POA: Diagnosis not present

## 2018-11-05 DIAGNOSIS — Z881 Allergy status to other antibiotic agents status: Secondary | ICD-10-CM | POA: Diagnosis not present

## 2018-11-05 DIAGNOSIS — I255 Ischemic cardiomyopathy: Secondary | ICD-10-CM | POA: Diagnosis not present

## 2018-11-05 DIAGNOSIS — N183 Chronic kidney disease, stage 3 (moderate): Secondary | ICD-10-CM | POA: Diagnosis not present

## 2018-11-05 DIAGNOSIS — E669 Obesity, unspecified: Secondary | ICD-10-CM | POA: Diagnosis not present

## 2018-11-05 DIAGNOSIS — Z823 Family history of stroke: Secondary | ICD-10-CM | POA: Diagnosis not present

## 2018-11-05 LAB — COMPREHENSIVE METABOLIC PANEL
ALT: 64 U/L — ABNORMAL HIGH (ref 0–44)
AST: 90 U/L — ABNORMAL HIGH (ref 15–41)
Albumin: 3.8 g/dL (ref 3.5–5.0)
Alkaline Phosphatase: 253 U/L — ABNORMAL HIGH (ref 38–126)
Anion gap: 9 (ref 5–15)
BUN: 26 mg/dL — ABNORMAL HIGH (ref 8–23)
CO2: 26 mmol/L (ref 22–32)
Calcium: 9.4 mg/dL (ref 8.9–10.3)
Chloride: 105 mmol/L (ref 98–111)
Creatinine, Ser: 1.51 mg/dL — ABNORMAL HIGH (ref 0.61–1.24)
GFR calc Af Amer: 51 mL/min — ABNORMAL LOW (ref >60)
GFR calc non Af Amer: 44 mL/min — ABNORMAL LOW (ref >60)
Glucose, Bld: 114 mg/dL — ABNORMAL HIGH (ref 70–99)
Potassium: 6 mmol/L — ABNORMAL HIGH (ref 3.5–5.1)
Sodium: 140 mmol/L (ref 135–145)
Total Bilirubin: 2.1 mg/dL — ABNORMAL HIGH (ref 0.3–1.2)
Total Protein: 6.1 g/dL — ABNORMAL LOW (ref 6.5–8.1)

## 2018-11-05 LAB — CBC
HCT: 39.2 % (ref 39.0–52.0)
Hemoglobin: 12 g/dL — ABNORMAL LOW (ref 13.0–17.0)
MCH: 30.7 pg (ref 26.0–34.0)
MCHC: 30.6 g/dL (ref 30.0–36.0)
MCV: 100.3 fL — ABNORMAL HIGH (ref 80.0–100.0)
Platelets: 203 10*3/uL (ref 150–400)
RBC: 3.91 MIL/uL — ABNORMAL LOW (ref 4.22–5.81)
RDW: 13.5 % (ref 11.5–15.5)
WBC: 7.4 10*3/uL (ref 4.0–10.5)
nRBC: 0 % (ref 0.0–0.2)

## 2018-11-05 LAB — TYPE AND SCREEN
ABO/RH(D): A POS
Antibody Screen: NEGATIVE

## 2018-11-05 LAB — SURGICAL PCR SCREEN
MRSA, PCR: NEGATIVE
Staphylococcus aureus: NEGATIVE

## 2018-11-05 NOTE — Progress Notes (Signed)
PCP - Dr. [name] Cardiologist - Dr. Fletcher Anon  Chest x-ray - 10/07/18 EKG - 11/05/18 Stress Test - 08/14/14 ECHO - 08/16/18 Cardiac Cath - 06/13/18  Sleep Study - denies  Aspirin Instructions: patient was instructed to continue ASA until DOS.  Anesthesia review: yes- cardiac history.  Patient denies shortness of breath, fever, cough and chest pain at PAT appointment   Patient verbalized understanding of instructions that were given to them at the PAT appointment. Patient was also instructed that they will need to review over the PAT instructions again at home before surgery.

## 2018-11-06 NOTE — Anesthesia Preprocedure Evaluation (Addendum)
Anesthesia Evaluation  Patient identified by MRN, date of birth, ID band Patient awake    Reviewed: Allergy & Precautions, NPO status , Patient's Chart, lab work & pertinent test results, reviewed documented beta blocker date and time   History of Anesthesia Complications Negative for: history of anesthetic complications  Airway Mallampati: II  TM Distance: >3 FB Neck ROM: Full    Dental  (+) Teeth Intact, Dental Advisory Given,    Pulmonary shortness of breath, asthma ,    breath sounds clear to auscultation       Cardiovascular hypertension, Pt. on medications and Pt. on home beta blockers (-) angina+ CAD, + CABG and +CHF  + dysrhythmias + pacemaker  Rhythm:Regular     Neuro/Psych negative neurological ROS  negative psych ROS   GI/Hepatic Neg liver ROS, GERD  ,  Endo/Other  Morbid obesity  Renal/GU CRFRenal disease     Musculoskeletal  (+) Arthritis ,   Abdominal   Peds  Hematology  (+) anemia ,   Anesthesia Other Findings   Reproductive/Obstetrics                                                            Anesthesia Evaluation  Patient identified by MRN, date of birth, ID band Patient awake    Reviewed: Allergy & Precautions, NPO status , Patient's Chart, lab work & pertinent test results, reviewed documented beta blocker date and time   Airway Mallampati: II  TM Distance: >3 FB Neck ROM: Full    Dental  (+) Teeth Intact, Dental Advisory Given   Pulmonary asthma ,    Pulmonary exam normal breath sounds clear to auscultation       Cardiovascular hypertension, Pt. on home beta blockers (-) angina+ CAD, + Past MI, + CABG and +CHF  Normal cardiovascular exam+ dysrhythmias + pacemaker  Rhythm:Regular Rate:Normal     Neuro/Psych negative neurological ROS     GI/Hepatic Neg liver ROS, GERD  Medicated,  Endo/Other  negative endocrine ROSObesity    Renal/GU Renal InsufficiencyRenal disease     Musculoskeletal negative musculoskeletal ROS (+)   Abdominal   Peds  Hematology  (+) Blood dyscrasia (Warfarin), ,   Anesthesia Other Findings Day of surgery medications reviewed with the patient.  Reproductive/Obstetrics                             Anesthesia Physical Anesthesia Plan  ASA: IV  Anesthesia Plan: General   Post-op Pain Management:    Induction: Intravenous  PONV Risk Score and Plan: 2 and Dexamethasone and Ondansetron  Airway Management Planned: Double Lumen EBT  Additional Equipment: Arterial line, CVP and Ultrasound Guidance Line Placement  Intra-op Plan:   Post-operative Plan: Extubation in OR and Possible Post-op intubation/ventilation  Informed Consent: I have reviewed the patients History and Physical, chart, labs and discussed the procedure including the risks, benefits and alternatives for the proposed anesthesia with the patient or authorized representative who has indicated his/her understanding and acceptance.   Dental advisory given  Plan Discussed with: CRNA  Anesthesia Plan Comments:         Anesthesia Quick Evaluation  Anesthesia Physical Anesthesia Plan  ASA: III  Anesthesia Plan: General   Post-op Pain Management:    Induction:  Intravenous  PONV Risk Score and Plan: 2 and Ondansetron and Dexamethasone  Airway Management Planned: Oral ETT  Additional Equipment: None  Intra-op Plan:   Post-operative Plan: Extubation in OR  Informed Consent: I have reviewed the patients History and Physical, chart, labs and discussed the procedure including the risks, benefits and alternatives for the proposed anesthesia with the patient or authorized representative who has indicated his/her understanding and acceptance.   Dental advisory given and Dental Advisory Given  Plan Discussed with: CRNA and Surgeon  Anesthesia Plan Comments: (6 wks s/p L VATS/DR.  of PL EFF 09/24/18 after CABG X 3 08/26/18 by Dr. Prescott Gum. Several abnormalities noted on PAT labs: Alk phos 253, AST: 90, ALT: 64, Total Bilirubin: 2.1. Values all up from 09/26/18. K+ 6.0 but sample hemolyzed so likely okay, will order istat4 DOS to be sure. Dr. Prescott Gum made aware and advised he felt okay to proceed. Discussed with Dr. Fransisco Beau with who also felt okay to proceed. Karoline Caldwell, PA-C   )     Anesthesia Quick Evaluation

## 2018-11-07 ENCOUNTER — Ambulatory Visit (HOSPITAL_COMMUNITY)
Admission: RE | Admit: 2018-11-07 | Discharge: 2018-11-07 | Disposition: A | Discharge status: Home / Self Care | Payer: PPO | Attending: Cardiothoracic Surgery | Admitting: Cardiothoracic Surgery

## 2018-11-07 ENCOUNTER — Other Ambulatory Visit: Payer: Self-pay

## 2018-11-07 ENCOUNTER — Ambulatory Visit (HOSPITAL_COMMUNITY): Payer: PPO | Admitting: Physician Assistant

## 2018-11-07 ENCOUNTER — Encounter (HOSPITAL_COMMUNITY)
Admission: RE | Disposition: A | Discharge status: Home / Self Care | Payer: Self-pay | Source: Home / Self Care | Attending: Cardiothoracic Surgery

## 2018-11-07 ENCOUNTER — Encounter (HOSPITAL_COMMUNITY): Payer: Self-pay | Admitting: *Deleted

## 2018-11-07 DIAGNOSIS — Z6830 Body mass index (BMI) 30.0-30.9, adult: Secondary | ICD-10-CM | POA: Diagnosis not present

## 2018-11-07 DIAGNOSIS — I255 Ischemic cardiomyopathy: Secondary | ICD-10-CM | POA: Insufficient documentation

## 2018-11-07 DIAGNOSIS — Z951 Presence of aortocoronary bypass graft: Secondary | ICD-10-CM | POA: Diagnosis not present

## 2018-11-07 DIAGNOSIS — K219 Gastro-esophageal reflux disease without esophagitis: Secondary | ICD-10-CM | POA: Diagnosis not present

## 2018-11-07 DIAGNOSIS — T82190A Other mechanical complication of cardiac electrode, initial encounter: Secondary | ICD-10-CM | POA: Diagnosis not present

## 2018-11-07 DIAGNOSIS — N183 Chronic kidney disease, stage 3 (moderate): Secondary | ICD-10-CM | POA: Insufficient documentation

## 2018-11-07 DIAGNOSIS — I130 Hypertensive heart and chronic kidney disease with heart failure and stage 1 through stage 4 chronic kidney disease, or unspecified chronic kidney disease: Secondary | ICD-10-CM | POA: Diagnosis not present

## 2018-11-07 DIAGNOSIS — M1990 Unspecified osteoarthritis, unspecified site: Secondary | ICD-10-CM | POA: Diagnosis not present

## 2018-11-07 DIAGNOSIS — I5022 Chronic systolic (congestive) heart failure: Secondary | ICD-10-CM | POA: Insufficient documentation

## 2018-11-07 DIAGNOSIS — T82598A Other mechanical complication of other cardiac and vascular devices and implants, initial encounter: Secondary | ICD-10-CM | POA: Diagnosis not present

## 2018-11-07 DIAGNOSIS — Z823 Family history of stroke: Secondary | ICD-10-CM | POA: Insufficient documentation

## 2018-11-07 DIAGNOSIS — I2510 Atherosclerotic heart disease of native coronary artery without angina pectoris: Secondary | ICD-10-CM | POA: Diagnosis not present

## 2018-11-07 DIAGNOSIS — I110 Hypertensive heart disease with heart failure: Secondary | ICD-10-CM | POA: Diagnosis not present

## 2018-11-07 DIAGNOSIS — Y831 Surgical operation with implant of artificial internal device as the cause of abnormal reaction of the patient, or of later complication, without mention of misadventure at the time of the procedure: Secondary | ICD-10-CM | POA: Insufficient documentation

## 2018-11-07 DIAGNOSIS — Z881 Allergy status to other antibiotic agents status: Secondary | ICD-10-CM | POA: Insufficient documentation

## 2018-11-07 DIAGNOSIS — E669 Obesity, unspecified: Secondary | ICD-10-CM | POA: Insufficient documentation

## 2018-11-07 DIAGNOSIS — Z86718 Personal history of other venous thrombosis and embolism: Secondary | ICD-10-CM | POA: Insufficient documentation

## 2018-11-07 DIAGNOSIS — R0602 Shortness of breath: Secondary | ICD-10-CM | POA: Diagnosis not present

## 2018-11-07 DIAGNOSIS — T82897A Other specified complication of cardiac prosthetic devices, implants and grafts, initial encounter: Secondary | ICD-10-CM | POA: Diagnosis not present

## 2018-11-07 DIAGNOSIS — Z955 Presence of coronary angioplasty implant and graft: Secondary | ICD-10-CM | POA: Insufficient documentation

## 2018-11-07 DIAGNOSIS — I509 Heart failure, unspecified: Secondary | ICD-10-CM | POA: Diagnosis not present

## 2018-11-07 HISTORY — PX: EPICARDIAL PACING LEAD PLACEMENT: SHX6274

## 2018-11-07 LAB — ACID FAST CULTURE WITH REFLEXED SENSITIVITIES (MYCOBACTERIA): Acid Fast Culture: NEGATIVE

## 2018-11-07 SURGERY — INSERTION, EPICARDIAL ELECTRODE LEAD
Anesthesia: General | Site: Chest | Laterality: Left

## 2018-11-07 MED ORDER — FENTANYL CITRATE (PF) 250 MCG/5ML IJ SOLN
INTRAMUSCULAR | Status: AC
  Filled 2018-11-07: qty 10

## 2018-11-07 MED ORDER — SUGAMMADEX SODIUM 200 MG/2ML IV SOLN
INTRAVENOUS | Status: DC | PRN
  Administered 2018-11-07: 200 mg via INTRAVENOUS

## 2018-11-07 MED ORDER — FENTANYL CITRATE (PF) 100 MCG/2ML IJ SOLN: 25.0000 ug | INTRAMUSCULAR | Status: DC | PRN

## 2018-11-07 MED ORDER — SODIUM CHLORIDE 0.9 % IV SOLN: 250.0000 mL | INTRAVENOUS | Status: DC | PRN

## 2018-11-07 MED ORDER — ACETAMINOPHEN 160 MG/5ML PO SOLN: 1000.0000 mg | Freq: Once | ORAL | Status: DC | PRN

## 2018-11-07 MED ORDER — ROCURONIUM BROMIDE 50 MG/5ML IV SOSY
PREFILLED_SYRINGE | INTRAVENOUS | Status: DC | PRN
  Administered 2018-11-07: 40 mg via INTRAVENOUS

## 2018-11-07 MED ORDER — LACTATED RINGERS IV SOLN
INTRAVENOUS | Status: DC
  Administered 2018-11-07: 10:00:00 via INTRAVENOUS

## 2018-11-07 MED ORDER — DEXAMETHASONE SODIUM PHOSPHATE 4 MG/ML IJ SOLN
INTRAMUSCULAR | Status: DC | PRN
  Administered 2018-11-07: 10 mg via INTRAVENOUS

## 2018-11-07 MED ORDER — SODIUM CHLORIDE 0.9% FLUSH: 3.0000 mL | INTRAVENOUS | Status: DC | PRN

## 2018-11-07 MED ORDER — LIDOCAINE HCL (PF) 1 % IJ SOLN
INTRAMUSCULAR | Status: AC
  Filled 2018-11-07: qty 30

## 2018-11-07 MED ORDER — ACETAMINOPHEN 650 MG RE SUPP: 650.0000 mg | RECTAL | Status: DC | PRN

## 2018-11-07 MED ORDER — 0.9 % SODIUM CHLORIDE (POUR BTL) OPTIME
TOPICAL | Status: DC | PRN
  Administered 2018-11-07: 1000 mL

## 2018-11-07 MED ORDER — HEMOSTATIC AGENTS (NO CHARGE) OPTIME
TOPICAL | Status: DC | PRN
  Administered 2018-11-07: 1 via TOPICAL

## 2018-11-07 MED ORDER — FENTANYL CITRATE (PF) 100 MCG/2ML IJ SOLN
INTRAMUSCULAR | Status: DC | PRN
  Administered 2018-11-07: 50 ug via INTRAVENOUS
  Administered 2018-11-07: 100 ug via INTRAVENOUS

## 2018-11-07 MED ORDER — LIDOCAINE HCL (PF) 0.5 % IJ SOLN
INTRAMUSCULAR | Status: DC | PRN
  Administered 2018-11-07: 30 mL

## 2018-11-07 MED ORDER — PROPOFOL 10 MG/ML IV BOLUS
INTRAVENOUS | Status: DC | PRN
  Administered 2018-11-07: 50 mg via INTRAVENOUS
  Administered 2018-11-07: 70 mg via INTRAVENOUS

## 2018-11-07 MED ORDER — SODIUM CHLORIDE 0.9% FLUSH: 3.0000 mL | Freq: Two times a day (BID) | INTRAVENOUS | Status: DC

## 2018-11-07 MED ORDER — OXYCODONE HCL 5 MG PO TABS: 5.0000 mg | ORAL_TABLET | ORAL | Status: DC | PRN

## 2018-11-07 MED ORDER — ACETAMINOPHEN 500 MG PO TABS: 1000.0000 mg | ORAL_TABLET | Freq: Once | ORAL | Status: DC | PRN

## 2018-11-07 MED ORDER — ACETAMINOPHEN 500 MG PO TABS: 1000.0000 mg | ORAL_TABLET | Freq: Four times a day (QID) | ORAL | Status: DC

## 2018-11-07 MED ORDER — LIDOCAINE 2% (20 MG/ML) 5 ML SYRINGE
INTRAMUSCULAR | Status: DC | PRN
  Administered 2018-11-07: 60 mg via INTRAVENOUS

## 2018-11-07 MED ORDER — OXYCODONE HCL 5 MG PO TABS: 5.0000 mg | ORAL_TABLET | Freq: Once | ORAL | Status: DC | PRN

## 2018-11-07 MED ORDER — MIDAZOLAM HCL 2 MG/2ML IJ SOLN
INTRAMUSCULAR | Status: AC
  Filled 2018-11-07: qty 2

## 2018-11-07 MED ORDER — LACTATED RINGERS IV SOLN: INTRAVENOUS | Status: DC | PRN

## 2018-11-07 MED ORDER — VANCOMYCIN HCL IN DEXTROSE 1-5 GM/200ML-% IV SOLN
1000.0000 mg | INTRAVENOUS | Status: AC
  Administered 2018-11-07: 1000 mg via INTRAVENOUS
  Filled 2018-11-07: qty 200

## 2018-11-07 MED ORDER — OXYCODONE HCL 5 MG/5ML PO SOLN: 5.0000 mg | Freq: Once | ORAL | Status: DC | PRN

## 2018-11-07 MED ORDER — SODIUM CHLORIDE 0.9 % IV SOLN
INTRAVENOUS | Status: DC | PRN
  Administered 2018-11-07: 20 ug/min via INTRAVENOUS

## 2018-11-07 MED ORDER — ONDANSETRON HCL 4 MG/2ML IJ SOLN
INTRAMUSCULAR | Status: DC | PRN
  Administered 2018-11-07: 4 mg via INTRAVENOUS

## 2018-11-07 MED ORDER — ACETAMINOPHEN 10 MG/ML IV SOLN: 1000.0000 mg | Freq: Once | INTRAVENOUS | Status: DC | PRN

## 2018-11-07 MED ORDER — ACETAMINOPHEN 325 MG PO TABS: 650.0000 mg | ORAL_TABLET | ORAL | Status: DC | PRN

## 2018-11-07 SURGICAL SUPPLY — 51 items
APL SKNCLS STERI-STRIP NONHPOA (GAUZE/BANDAGES/DRESSINGS) ×1
BENZOIN TINCTURE PRP APPL 2/3 (GAUZE/BANDAGES/DRESSINGS) ×2 IMPLANT
CANISTER SUCT 3000ML PPV (MISCELLANEOUS) ×3 IMPLANT
CATH THORACIC 28FR (CATHETERS) ×3 IMPLANT
CLOSURE WOUND 1/2 X4 (GAUZE/BANDAGES/DRESSINGS) ×1
COVER SURGICAL LIGHT HANDLE (MISCELLANEOUS) ×3 IMPLANT
COVER WAND RF STERILE (DRAPES) ×3 IMPLANT
DRAPE C-ARM 42X72 X-RAY (DRAPES) IMPLANT
DRAPE CHEST BREAST 15X10 FENES (DRAPES) ×2 IMPLANT
DRAPE HALF SHEET 40X57 (DRAPES) ×8 IMPLANT
DRAPE INCISE IOBAN 66X45 STRL (DRAPES) ×3 IMPLANT
DRAPE LAPAROSCOPIC ABDOMINAL (DRAPES) ×3 IMPLANT
ELECT BLADE 4.0 EZ CLEAN MEGAD (MISCELLANEOUS) ×3
ELECT REM PT RETURN 9FT ADLT (ELECTROSURGICAL) ×3
ELECTRODE BLDE 4.0 EZ CLN MEGD (MISCELLANEOUS) IMPLANT
ELECTRODE REM PT RTRN 9FT ADLT (ELECTROSURGICAL) ×1 IMPLANT
GAUZE SPONGE 4X4 12PLY STRL (GAUZE/BANDAGES/DRESSINGS) ×3 IMPLANT
GLOVE BIO SURGEON STRL SZ 6.5 (GLOVE) ×3 IMPLANT
GLOVE BIO SURGEONS STRL SZ 6.5 (GLOVE) ×3
GLOVE BIOGEL PI IND STRL 7.5 (GLOVE) IMPLANT
GLOVE BIOGEL PI INDICATOR 7.5 (GLOVE) ×2
GOWN STRL REUS W/ TWL LRG LVL3 (GOWN DISPOSABLE) ×2 IMPLANT
GOWN STRL REUS W/ TWL XL LVL3 (GOWN DISPOSABLE) ×1 IMPLANT
GOWN STRL REUS W/TWL LRG LVL3 (GOWN DISPOSABLE) ×6
GOWN STRL REUS W/TWL XL LVL3 (GOWN DISPOSABLE) ×3
KIT BASIN OR (CUSTOM PROCEDURE TRAY) ×3 IMPLANT
KIT TURNOVER KIT B (KITS) ×3 IMPLANT
NDL HYPO 25GX1X1/2 BEV (NEEDLE) IMPLANT
NEEDLE 22X1 1/2 (OR ONLY) (NEEDLE) ×2 IMPLANT
NEEDLE HYPO 25GX1X1/2 BEV (NEEDLE) ×3 IMPLANT
NS IRRIG 1000ML POUR BTL (IV SOLUTION) ×6 IMPLANT
PACK CHEST (CUSTOM PROCEDURE TRAY) ×3 IMPLANT
PACK SURGICAL SETUP 50X90 (CUSTOM PROCEDURE TRAY) ×3 IMPLANT
PAD ARMBOARD 7.5X6 YLW CONV (MISCELLANEOUS) ×6 IMPLANT
STRIP CLOSURE SKIN 1/2X4 (GAUZE/BANDAGES/DRESSINGS) ×1 IMPLANT
SUT SILK  1 MH (SUTURE) ×2
SUT SILK 1 MH (SUTURE) ×1 IMPLANT
SUT SILK 2 0 (SUTURE) ×3
SUT SILK 2 0 SH CR/8 (SUTURE) IMPLANT
SUT SILK 2-0 18XBRD TIE 12 (SUTURE) ×1 IMPLANT
SUT VIC AB 2-0 CT1 27 (SUTURE) ×3
SUT VIC AB 2-0 CT1 TAPERPNT 27 (SUTURE) IMPLANT
SUT VIC AB 2-0 CTX 27 (SUTURE) ×2 IMPLANT
SUT VIC AB 3-0 SH 18 (SUTURE) ×2 IMPLANT
SUT VIC AB 3-0 X1 27 (SUTURE) ×2 IMPLANT
SYSTEM SAHARA CHEST DRAIN ATS (WOUND CARE) ×3 IMPLANT
TAPE CLOTH SURG 4X10 WHT LF (GAUZE/BANDAGES/DRESSINGS) ×2 IMPLANT
TOWEL GREEN STERILE (TOWEL DISPOSABLE) ×3 IMPLANT
TOWEL GREEN STERILE FF (TOWEL DISPOSABLE) ×3 IMPLANT
TRAY FOLEY MTR SLVR 16FR STAT (SET/KITS/TRAYS/PACK) ×3 IMPLANT
WATER STERILE IRR 1000ML POUR (IV SOLUTION) ×3 IMPLANT

## 2018-11-07 NOTE — Discharge Instructions (Signed)
Keep dressing intact and dry until Sunday 12-22 No driving for 24 hours Office will call for followup appt in january

## 2018-11-07 NOTE — Brief Op Note (Signed)
11/07/2018  1:58 PM  PATIENT:  Sean [name].  77 y.o. male  PRE-OPERATIVE DIAGNOSIS:  HX OF CABG, EPICARDIAL PACER LEADS  POST-OPERATIVE DIAGNOSIS:  HX OF CABG, EPICARDIAL PACER LEADS  PROCEDURE:  Procedure(s): REVISE PACEMAKER LEAD LEFT CHEST (Left)  SURGEON:  Surgeon(s) and Role:    Ivin Poot, MD - Primary  PHYSICIAN ASSISTANT: none  ASSISTANTS:  Elizabeth   ANESTHESIA:   general  EBL:  3 mL   BLOOD ADMINISTERED:none  DRAINS: none   LOCAL MEDICATIONS USED:  LIDOCAINE  and Amount: 3 ml  SPECIMEN:  No Specimen  DISPOSITION OF SPECIMEN:  N/A  COUNTS:  YES  TOURNIQUET:  * No tourniquets in log *  DICTATION: .Dragon Dictation  PLAN OF CARE: Discharge to home after PACU  PATIENT DISPOSITION:  PACU - hemodynamically stable.   Delay start of Pharmacological VTE agent (>24hrs) due to surgical blood loss or risk of bleeding: yes

## 2018-11-07 NOTE — Anesthesia Procedure Notes (Signed)
Procedure Name: Intubation Date/Time: 11/07/2018 1:10 PM Performed by: Julieta Bellini, CRNA Pre-anesthesia Checklist: Patient identified, Emergency Drugs available, Suction available and Patient being monitored Patient Re-evaluated:Patient Re-evaluated prior to induction Oxygen Delivery Method: Circle system utilized Preoxygenation: Pre-oxygenation with 100% oxygen Induction Type: IV induction Ventilation: Mask ventilation without difficulty Laryngoscope Size: Mac and 4 Grade View: Grade I Tube type: Oral Tube size: 7.5 mm Number of attempts: 1 Airway Equipment and Method: Stylet Placement Confirmation: ETT inserted through vocal cords under direct vision,  positive ETCO2 and breath sounds checked- equal and bilateral Secured at: 23 cm Tube secured with: Tape Dental Injury: Teeth and Oropharynx as per pre-operative assessment

## 2018-11-07 NOTE — Transfer of Care (Signed)
Immediate Anesthesia Transfer of Care Note  Patient: Sean [name].  Procedure(s) Performed: REVISE PACEMAKER LEAD LEFT CHEST (Left Chest)  Patient Location: PACU  Anesthesia Type:General  Level of Consciousness: awake, alert , oriented and patient cooperative  Airway & Oxygen Therapy: Patient Spontanous Breathing and Patient connected to nasal cannula oxygen  Post-op Assessment: Report given to RN, Post -op Vital signs reviewed and stable and Patient moving all extremities X 4  Post vital signs: Reviewed and stable  Last Vitals:  Vitals Value Taken Time  BP 144/95 11/07/2018  1:55 PM  Temp    Pulse 79 11/07/2018  1:57 PM  Resp 11 11/07/2018  1:57 PM  SpO2 99 % 11/07/2018  1:57 PM  Vitals shown include unvalidated device data.  Last Pain:  Vitals:   11/07/18 1019  TempSrc:   PainSc: 0-No pain      Patients Stated Pain Goal: 2 (09/62/83 6629)  Complications: No apparent anesthesia complications

## 2018-11-07 NOTE — H&P (Signed)
PCP is [name], MD Referring Provider is No ref. provider found  No chief complaint on file.   HPI:  History of multivessel CABG with  LV epicardial pacemaker lead placement October 7 Ischemic cardiomyopathy History DVT left upper extremity, resolved Chronic stable heart failure Chronic stage III kidney disease History of transvenous pacemaker placement  Patient doing well now after multivessel CABG. No symptoms of angina, CHF Last EF was approximately 30% and his postop echo is pending. Sternal incision is healing well. Epicardial lead placed in the pocket close to his permanent pacemaker pocket starting to show pressure against the skin and will need to be revised.  Lead is still below the dermis. Past Medical History:  Diagnosis Date  . Arthritis   . Asthma   . CAD (coronary artery disease)    a. LHC 7/19: ostLM 30%, ostLAD 30%, p-mLAD 99% mod calcified, ostD1 90%, ost-pLCx 80% ulcerative & mod calcified, p-mLCx 50%, OM2 50%, mod dz LPDA, RCA small w/ diffuse dz throughout; b. recommendation of CABG; c. 3-V CABG 08/26/18 (LIMA-LAD, VG-Diag, VG-LCx)  . Chronic systolic CHF (congestive heart failure) (HCC)    a. EF 35-40%, diffuse HK with HK of the apical, anteroseptal, and anterior myocardium, Gr1DD, mild MR, mildly dilated LA, pacer wire noted in the RV with nl RVSF, PASP nl  . CKD (chronic kidney disease), stage III ([place])   . Deep vein thrombosis (DVT) of upper extremity ([place])    a. DVT of left subclavian dx'd 06/17/2018; b. Eliquis  . GERD (gastroesophageal reflux disease)    takes otc occasionally  . History of complete heart block    a. s/p MDT PPM 05/2017  . Hypertension   . Obesity   . Presence of permanent cardiac pacemaker    Dr. Beckie Salts implanted 05/2017  . PVC's (premature ventricular contractions)    a. PPM-induced    Past Surgical History:  Procedure Laterality Date  . BACK SURGERY    . CORONARY ARTERY BYPASS GRAFT N/A 08/26/2018   Procedure: CORONARY  ARTERY BYPASS GRAFTING (CABG) x3 , Using left internal mammory artery and right leg greater saphronious vein. Harvested endoscopically.;  Surgeon: Ivin Poot, MD;  Location: [place];  Service: Open Heart Surgery;  Laterality: N/A;  . EPICARDIAL PACING LEAD PLACEMENT N/A 08/26/2018   Procedure: LV PACING LEAD PLACEMENT;  Surgeon: Ivin Poot, MD;  Location: [place];  Service: Thoracic;  Laterality: N/A;  . INSERT / REPLACE / REMOVE PACEMAKER     [employer]  . LEFT HEART CATH AND CORONARY ANGIOGRAPHY N/A 06/13/2018   Procedure: LEFT HEART CATH AND CORONARY ANGIOGRAPHY;  Surgeon: Wellington Hampshire, MD;  Location: [place] [facility];  Service: Cardiovascular;  Laterality: N/A;  . LEG SURGERY    . PACEMAKER IMPLANT N/A 06/11/2017   Procedure: Pacemaker Implant;  Surgeon: Evans Lance, MD;  Location: [place] [facility];  Service: Cardiovascular;  Laterality: N/A;  . PLEURAL EFFUSION DRAINAGE Left 09/24/2018   Procedure: DRAINAGE OF LOCULATED PLEURAL EFFUSION;  Surgeon: Ivin Poot, MD;  Location: [place];  Service: Thoracic;  Laterality: Left;  . TEE WITHOUT CARDIOVERSION N/A 08/26/2018   Procedure: TRANSESOPHAGEAL ECHOCARDIOGRAM (TEE);  Surgeon: Prescott Gum, Collier Salina, MD;  Location: [place];  Service: Open Heart Surgery;  Laterality: N/A;  . VIDEO ASSISTED THORACOSCOPY Left 09/24/2018   Procedure: VIDEO ASSISTED THORACOSCOPY;  Surgeon: Ivin Poot, MD;  Location: [place];  Service: Thoracic;  Laterality: Left;    Family History  Problem  Relation Age of Onset  . Stroke Mother   . Stroke Father   . Alcohol abuse Father   . Diabetes Brother   . Alcohol abuse Brother   . Stroke Maternal Grandfather     Social History Social History   Tobacco Use  . Smoking status: Never Smoker  . Smokeless tobacco: Never Used  Substance Use Topics  . Alcohol use: Yes    Comment: occ.  . Drug use: No    Current Facility-Administered Medications  Medication Dose Route Frequency Provider Last Rate  Last Dose  . lactated ringers infusion   Intravenous Continuous Ivin Poot, MD 10 mL/hr at 11/07/18 1024    . vancomycin (VANCOCIN) IVPB 1000 mg/200 mL premix  1,000 mg Intravenous On Call to OR Ivin Poot, MD        Allergies  Allergen Reactions  . Azithromycin Hives  . Erythromycin Hives    Review of Systems  No fever No syncope No shortness of breath No chest pain No difficulty swallowing No ankle edema Complains of nocturia, urgency  BP (!) 152/98   Pulse 78   Temp (!) 97.5 F (36.4 C) (Oral)   Resp 18   Ht 5' 9.5" (1.765 m)   Wt 96.2 kg   SpO2 100%   BMI 30.86 kg/m  Physical Exam      Exam    General- alert and comfortable    Neck- no JVD, no cervical adenopathy palpable, no carotid bruit   Lungs- clear without rales, wheezes Chest -well-healed sternal incision.  Epicardial pacing lead showing outward pressure against the dermis with dermis intact   Cor- regular rate and rhythm, no murmur , gallop   Abdomen- soft, non-tender   Extremities - warm, non-tender, minimal edema   Neuro- oriented, appropriate, no focal weakness   Diagnostic Tests: None  Impression: Patient recovered from CABG and sternal precautions will be lifted after January 1. I am concerned about the LV epicardial lead which was placed in case his ejection fraction does not improve.  If we will need to be revised in the operating room.  This will be scheduled for Thursday, December 19 at [place]: I discussed the procedure of revision of pacemaker lead including use of anesthesia, the location incision, and the plan for this to be an outpatient procedure with the patient and wife.  They understand the risks of bleeding, infection, and discomfort.    Len Childs, MD [facility]   Patient has been prepared for revision of L chest pacemaker pocket and is ready for surgery P Prescott Gum MD [phone]

## 2018-11-07 NOTE — Progress Notes (Signed)
Pre Procedure note for inpatients:   Sean [name]. has been scheduled for Procedure(s): REVISE PACEMAKER LEAD LEFT CHEST (Left) today. The various methods of treatment have been discussed with the patient. After consideration of the risks, benefits and treatment options the patient has consented to the planned procedure.   The patient has been seen and labs reviewed. There are no changes in the patient's condition to prevent proceeding with the planned procedure today.  Recent labs:  Lab Results  Component Value Date   WBC 7.4 11/05/2018   HGB 12.0 (L) 11/05/2018   HCT 39.2 11/05/2018   PLT 203 11/05/2018   GLUCOSE 114 (H) 11/05/2018   CHOL 116 06/26/2018   TRIG 64 06/26/2018   HDL 43 06/26/2018   LDLCALC 60 06/26/2018   ALT 64 (H) 11/05/2018   AST 90 (H) 11/05/2018   NA 140 11/05/2018   K 6.0 (H) 11/05/2018   CL 105 11/05/2018   CREATININE 1.51 (H) 11/05/2018   BUN 26 (H) 11/05/2018   CO2 26 11/05/2018   TSH 3.141 06/26/2018   INR 1.94 09/24/2018   HGBA1C 6.0 (H) 08/22/2018    Len Childs, MD 11/07/2018 11:18 AM

## 2018-11-07 NOTE — Anesthesia Postprocedure Evaluation (Signed)
Anesthesia Post Note  Patient: Sean [name].  Procedure(s) Performed: REVISE PACEMAKER LEAD LEFT CHEST (Left Chest)     Patient location during evaluation: PACU Anesthesia Type: General Level of consciousness: awake and alert Pain management: pain level controlled Vital Signs Assessment: post-procedure vital signs reviewed and stable Respiratory status: spontaneous breathing, nonlabored ventilation, respiratory function stable and patient connected to nasal cannula oxygen Cardiovascular status: blood pressure returned to baseline and stable Postop Assessment: no apparent nausea or vomiting Anesthetic complications: no    Last Vitals:  Vitals:   11/07/18 1440 11/07/18 1515  BP: (!) 153/98 (!) 159/95  Pulse: 79 79  Resp: 14 18  Temp:  (!) 36.1 C  SpO2: 100% 100%    Last Pain:  Vitals:   11/07/18 1515  TempSrc:   PainSc: 0-No pain                 Asiya Cutbirth

## 2018-11-08 ENCOUNTER — Encounter (HOSPITAL_COMMUNITY): Payer: Self-pay | Admitting: Cardiothoracic Surgery

## 2018-11-08 NOTE — Op Note (Signed)
NAME: Sean [name], Fikes MEDICAL RECORD DJ:[id number] ACCOUNT [id number] DATE OF BIRTH:08-Sep-1941 FACILITY: [facility] LOCATION: [facility] PHYSICIAN:Willia Genrich VAN TRIGT III, MD  OPERATIVE REPORT  DATE OF PROCEDURE:  11/07/2018  OPERATION:  Revision of pacemaker lead pocket, left chest.  SURGEON:  Ivin Poot, MD  PREOPERATIVE DIAGNOSIS:  Subcutaneous pressure exerted by the epicardial pacing wire placed at urgent CABG approximately 3 months ago.  POSTOPERATIVE DIAGNOSIS:  Subcutaneous pressure exerted by the epicardial pacing wire placed at urgent CABG approximately 3 months ago.  ANESTHESIA:  General.  DESCRIPTION OF PROCEDURE:  The patient was brought from preoperative holding after he had been examined and prepared as an outpatient.  All questions were addressed and the informed consent was documented and the proper site marked.  The patient was  placed supine on the operating table.  General anesthesia was induced.  The left chest was prepped and draped as a sterile field.  A proper time-out was performed.  The permanent epicardial lead that had been tunneled from the lateral LV to the previously placed pacemaker pocket was easily felt under the skin.  An incision was made over this area and extended laterally.  The epicardial lead was totally freed from  the investing adhesions and the pacemaker generator pocket.  Hemostasis was good.  The pectoralis muscles were split and the pacing wire was coiled and placed deep to the left of the pectoralis muscle.  The muscle was closed over the pacing leads with  interrupted 3-0 Vicryl sutures.  Next, the subcutaneous layer was closed with a running 2-0 Vicryl.  Next, the skin was closed with a running 3-0 Vicryl.  A sterile dressing was applied.  The patient was reversed from anesthesia, extubated and returned to recovery room in stable condition for later discharge home.  TN/NUANCE  D:11/07/2018 T:11/08/2018 JOB:[id number]

## 2018-11-18 ENCOUNTER — Ambulatory Visit: Payer: PPO | Admitting: Physician Assistant

## 2018-11-18 ENCOUNTER — Telehealth: Payer: Self-pay | Admitting: Internal Medicine

## 2018-11-18 ENCOUNTER — Other Ambulatory Visit: Payer: PPO

## 2018-11-18 NOTE — Telephone Encounter (Signed)
Spoke w/ pt and instructed him how to send a manual transmission w/ his home monitor to ensure that the monitor is still working. Transmission received.

## 2018-11-18 NOTE — Progress Notes (Signed)
Cardiology Office Note Date:  11/19/2018  Patient ID:  Sean Keetch., DOB Apr 07, 1941, MRN [id number] PCP:  [name], MD  Cardiologist:  Dr. Fletcher Anon, MD Electrophysiologist: Dr. Caryl Comes, MD    Chief Complaint: Hospital follow up  History of Present Illness: Sean Cooler. is a 77 y.o. 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   Patient previously underwent Myoview in 07/2014 that was negative for ischemia with an EF of 55-65%. He was admitted to the hospital in 05/2017 with dizziness and found to be in complete heart block. He underwent successful implantation of MDT dual-chamber PPM on 06/11/2017. In follow up in 08/2017 he was noted to have frequent PVCs on device interrogation of ~ 5.5% burden. Follow up echo in 08/2017 showed an EF of 50-55%, no RWMA, Gr1DD, mild MR, left atrium normal in size, pacer wire noted in the RV with normal RVSF, PASP normal. Follow up echo in 03/2018 showed a newly reduced EF of 35-40%, diffuse HK with HK of the apical, anteroseptal, and anterior myocardium, Gr1DD, mild MR, left atrium mildly dilated, pacer wire noted in the RV with normal RVSF, PASP normal. In follow up with EP on 04/30/18 there was continued frequent PVCs on device interrogation accounting for ~ >9% burden. He underwent cardiac CTA to further assess his cardiomyopathy that showed evidence of 3-vessel CAD with a left dominant system and FFR less than 0.6 in all vessels. Because of this, he underwent LHC on 06/13/2018 that showed severe, heavily calcified 3-vessel CAD  with subtotal occlusion of the mid LAD, ulcerated plaque in the proximal LCx and a small RCA that was nondominant and diffusely diseased. It was recommended the patient be evaluated for CABG. Unfortunately, patient noted some left arm swelling following his cath (he underwent right radial artery approach). He was seen in the ED and noted to have a left subclavian DVT. He was started on Eliquis on 06/17/2018. He was seen by Dr. Prescott Gum on 06/25/2018 for initial consult of possible CABG with recommendation for CABG following improvement/treatment of his left subclavian DVT. Recommendation was to proceed in ~ 3-4 weeks time after repeat ultrasound of the left arm DVT as well as a CT of his chest to rule out mass or pathology in the left lung apex. Prior to undergoing CABG, he was admitted to [facility] in 06/2018 for likely vertigo with associated systolic hypertension and was started on meclizine. Follow up echo on 08/16/2018 ordered by EP to evaluate his cardiomyopathy showed an EF of 35-40%, diffuse HK, trivial MR, RVSF normal, pacer wire noted in the RA/RV, PASP 21. Patient underwent successful, planned 3-vessel CABG on 08/26/2018 with a LIMA to LAD, SVG to diagonal, SVG to LCx as well as placement of a permanent epicardial LV pacing lead for possible later biventricular pacing at the request of Dr. Caryl Comes.  Patient was seen in the [facility] ED on 09/22/2018 with syncope.  He was hypotensive in the field and upon arrival to the ED.  CT chest was performed and showed concern for a large hemothorax on the left side.  Given this, he was transferred to [facility], where he underwent video-assisted thorascopic surgery with a minithoracotomy for drainage of left-sided hemothorax.  He was seen by CVTS on 10/30/2018 with evidence of his epicardial lead that was placed in the pocket close to his PPM starting to show pressure against the skin.  In this setting he underwent PPM lead revision on 11/07/2018 by CVTS.  He has undergone  echocardiogram on 11/19/2018 to reevaluate his LV function with results pending at this time.  Labs: 10/2018 - hemoglobin 12.0, potassium 6.0 with hemolysis noted, serum creatinine 1.51, AST 90, ALT 64, alkaline phosphatase 253, total bilirubin 2.1   Patient comes in accompanied by his wife today.  He is doing well and does not have any issues or complaints at this time.  He continues to follow the restrictions that have been outlined for him by CVTS.  He is not interested in participating in cardiac rehab and prefers to resume going to his gym once he is cleared by CVTS.  He denies any chest pain, shortness of breath, palpitations, lower extremity swelling, orthopnea, PND, early satiety, dizziness, presyncope, or syncope.  Blood pressure has typically been reasonably controlled in the 008Q to 761P systolic at home.  He is compliant with all medications.  No falls since he was last seen.  No BRBPR or melena.  He has follow-up appointment with CVTS on 11/21/2018.  No surgical site complications.  He has not been limiting his salt intake since his hospital discharge.  He has undergone repeat echocardiogram as ordered by EP earlier today with results pending at this time.   Past Medical History:  Diagnosis Date  . Arthritis   . Asthma   . CAD (coronary artery disease)    a. LHC 7/19: ostLM 30%, ostLAD 30%, p-mLAD 99% mod calcified, ostD1 90%, ost-pLCx 80% ulcerative & mod calcified, p-mLCx 50%, OM2 50%, mod dz LPDA, RCA small w/ diffuse dz throughout; b. recommendation of CABG; c. 3-V CABG 08/26/18 (LIMA-LAD, VG-Diag, VG-LCx)  . Chronic systolic CHF (congestive heart failure) (HCC)    a. EF 35-40%, diffuse HK with HK of the apical, anteroseptal, and anterior myocardium, Gr1DD, mild MR, mildly dilated LA, pacer wire noted in the RV with nl RVSF, PASP nl  . CKD (chronic kidney disease), stage III ([place])   . Deep vein thrombosis (DVT) of upper extremity ([place])    a. DVT of left subclavian dx'd 06/17/2018; b.  Eliquis  . GERD (gastroesophageal reflux disease)    takes otc occasionally  . History of complete heart block    a. s/p MDT PPM 05/2017  . Hypertension   . Obesity   . Presence of permanent cardiac pacemaker    Dr. Beckie Salts implanted 05/2017  . PVC's (premature ventricular contractions)    a. PPM-induced    Past Surgical History:  Procedure Laterality Date  . BACK SURGERY    . CORONARY ARTERY BYPASS GRAFT N/A 08/26/2018   Procedure: CORONARY ARTERY BYPASS GRAFTING (CABG) x3 , Using left internal mammory artery and right leg greater saphronious vein. Harvested endoscopically.;  Surgeon: Ivin Poot, MD;  Location: [place];  Service: Open Heart Surgery;  Laterality: N/A;  . EPICARDIAL PACING LEAD PLACEMENT N/A 08/26/2018   Procedure: LV PACING LEAD PLACEMENT;  Surgeon: Ivin Poot, MD;  Location: [place];  Service: Thoracic;  Laterality: N/A;  . EPICARDIAL PACING LEAD PLACEMENT Left 11/07/2018   Procedure: REVISE PACEMAKER LEAD LEFT CHEST;  Surgeon: Ivin Poot, MD;  Location: [facility] OR;  Service: Thoracic;  Laterality: Left;  . INSERT / REPLACE / REMOVE PACEMAKER     MEDTRONIC  . LEFT HEART CATH AND CORONARY ANGIOGRAPHY N/A 06/13/2018   Procedure: LEFT HEART CATH AND CORONARY ANGIOGRAPHY;  Surgeon: Wellington Hampshire, MD;  Location: [place] CV LAB;  Service: Cardiovascular;  Laterality: N/A;  . LEG SURGERY    . PACEMAKER IMPLANT N/A 06/11/2017   Procedure: Pacemaker Implant;  Surgeon: Evans Lance, MD;  Location: [place] CV LAB;  Service: Cardiovascular;  Laterality: N/A;  . PLEURAL EFFUSION DRAINAGE Left 09/24/2018   Procedure: DRAINAGE OF LOCULATED PLEURAL EFFUSION;  Surgeon: Ivin Poot, MD;  Location: [place];  Service: Thoracic;  Laterality: Left;  . TEE WITHOUT CARDIOVERSION N/A 08/26/2018   Procedure: TRANSESOPHAGEAL ECHOCARDIOGRAM (TEE);  Surgeon: Prescott Gum, Collier Salina, MD;  Location: [place];  Service: Open Heart Surgery;  Laterality: N/A;  . VIDEO ASSISTED  THORACOSCOPY Left 09/24/2018   Procedure: VIDEO ASSISTED THORACOSCOPY;  Surgeon: Prescott Gum, Collier Salina, MD;  Location: [facility] OR;  Service: Thoracic;  Laterality: Left;    Current Meds  Medication Sig  . albuterol (PROVENTIL HFA;VENTOLIN HFA) 108 (90 Base) MCG/ACT inhaler Inhale 1-2 puffs into the lungs every 4 (four) hours as needed for wheezing or shortness of breath.  . allopurinol (ZYLOPRIM) 100 MG tablet Take 100 mg by mouth 2 (two) times daily.   Marland Kitchen aspirin EC 81 MG tablet Take 81 mg by mouth daily.  Marland Kitchen atorvastatin (LIPITOR) 40 MG tablet Take 1 tablet (40 mg total) by mouth daily.  . Fluticasone-Salmeterol (ADVAIR) 100-50 MCG/DOSE AEPB Inhale 1 puff into the lungs daily as needed (shortness of breath).  . metoprolol succinate (TOPROL-XL) 25 MG 24 hr tablet Take 1 tablet (25 mg total) by mouth daily.  . Multiple Vitamin (MULTIVITAMIN) tablet Take 1 tablet by mouth daily.  Marland Kitchen Specialty Vitamins Products (PROSTATE PO) Take 1 tablet by mouth daily.  Marland Kitchen spironolactone (ALDACTONE) 25 MG tablet Take 0.5 tablets (12.5 mg total) by mouth daily.    Allergies:   Azithromycin and Erythromycin   Social History:  The patient  reports that he has never smoked. He has never used smokeless tobacco. He reports current alcohol use. He reports that he does not use drugs.   Family History:  The patient's family history includes Alcohol abuse in his brother and father; Diabetes in his brother; Stroke in his father, maternal grandfather, and mother.  ROS:   Review of Systems  Constitutional: Positive for malaise/fatigue. Negative for chills, diaphoresis, fever and weight loss.  HENT: Negative for congestion.   Eyes: Negative for discharge and redness.  Respiratory: Negative for cough, hemoptysis, sputum production, shortness of breath and wheezing.   Cardiovascular: Negative for chest pain, palpitations, orthopnea, claudication, leg swelling and PND.  Gastrointestinal: Negative for abdominal pain, blood in stool,  heartburn, melena, nausea and vomiting.  Genitourinary: Negative for hematuria.  Musculoskeletal: Negative for falls and myalgias.  Skin: Negative for rash.  Neurological: Negative for dizziness, tingling, tremors, sensory change, speech change, focal weakness, loss of consciousness and weakness.  Endo/Heme/Allergies: Does not bruise/bleed easily.  Psychiatric/Behavioral: Negative for substance abuse. The patient is not nervous/anxious.   All other systems reviewed and are negative.    PHYSICAL EXAM:  VS:  BP 140/88 (BP Location: Left Arm, Patient Position: Sitting, Cuff Size: Normal)   Pulse 80   Ht 5' 9.5" (1.765 m)   Wt 216 lb (98 kg)   BMI 31.44 kg/m  BMI:  Body mass index is 31.44 kg/m.  Physical Exam  Constitutional: He is oriented to person, place, and time. He appears well-developed and well-nourished.  HENT:  Head: Normocephalic and atraumatic.  Eyes: Right eye exhibits no discharge. Left eye exhibits no discharge.  Neck: Normal range of motion. No JVD present.  Cardiovascular: Normal rate, regular rhythm, S1 normal, S2 normal and normal heart sounds. Exam reveals no distant heart sounds, no friction rub, no midsystolic click and no opening snap.  No murmur heard. Pulses:      Posterior tibial pulses are 2+ on the right side and 2+ on the left side.  Well-healing anterior midline surgical scar.  Pacemaker device pocket surgical scar with Steri-Strips in place following pacemaker lead revision procedure as outlined above.  Surgical scar appears to be healing well.  Pulmonary/Chest: Effort normal and breath sounds normal. No respiratory distress. He has no decreased breath sounds. He has no wheezes. He has no rales. He exhibits no tenderness.  Abdominal: Soft. He exhibits no distension. There is no abdominal tenderness.  Musculoskeletal:        General: No edema.  Neurological: He is alert and oriented to person, place, and time.  Skin: Skin is warm and dry. No cyanosis.  Nails show no clubbing.  Psychiatric: He has a normal mood and affect. His speech is normal and behavior is normal. Judgment and thought content normal.     EKG:  Was ordered and interpreted by me today. Shows Paced rhythm, 80 bpm  Recent Labs: 06/26/2018: TSH 3.141 08/28/2018: Magnesium 2.5 11/05/2018: ALT 64; BUN 26; Creatinine, Ser 1.51; Hemoglobin 12.0; Platelets 203; Potassium 6.0; Sodium 140  06/26/2018: Cholesterol 116; HDL 43; LDL Cholesterol 60; Total CHOL/HDL Ratio 2.7; Triglycerides 64; VLDL 13   Estimated Creatinine Clearance: 48.4 mL/min (A) (by C-G formula based on SCr of 1.51 mg/dL (H)).   Wt Readings from Last 3 Encounters:  11/19/18 216 lb (98 kg)  11/07/18 212 lb (96.2 kg)  11/05/18 213 lb 6.4 oz (96.8 kg)     Other studies reviewed: Additional studies/records reviewed today include: summarized above  ASSESSMENT AND PLAN:  1. HFrEF: He appears well compensated and euvolemic on exam today.  His cardiomyopathy appears to be multifactorial including ischemic and pacemaker induced.  Preliminary report on his echocardiogram performed earlier today is that his EF has not improved following revascularization.  In this setting, we will ask that he be reevaluated by EP for possible left ventricular lead pacing as outlined in their note on 10/01/2018.  We will also plan to optimize his medical therapy.  However, we will check a CMP today prior to consideration of addition of ACE inhibitor/ARB given his recent AKI and possible hyperkalemia (however the specimen was hemolyzed making hyperkalemia less likely).  We will call him with these results and medication changes.  For now, he will remain on Toprol-XL and spironolactone.  CHF education provided.  Patient has been advised to avoid salt, which he previously was not doing.  2. CAD status post CABG: Doing well without any symptoms concerning for angina.  Remains on aspirin, Toprol, and Lipitor.  Continue secondary prevention.  No plans  for further ischemic evaluation at this time.  Has follow-up with CVTS later this week.  3. Complete heart block: Device appears to be functioning appropriately on twelve-lead EKG today.  Followed by EP.  4. Upper extremity DVT: Has completed oral anticoagulation.  5. Hemothorax: Status post VATS with drainage per CVTS.  6. CKD stage III:  Most recent serum creatinine of 1.51 was improving from the month prior.  Check CMP.  7. Hyperkalemia: Noted on a CMP drawn on 11/05/2018.  However, hemolysis was noted with the specimen.  Check CMP.  Recommend patient avoid salt substitutes/seasonings that are high in potassium.  8. Transaminitis: Check CMP.  9. Hypertension: Blood pressure is suboptimally controlled today.  As above, we will either add an ACE inhibitor/ARB or escalate his metoprolol given his persistent cardiomyopathy.  He will otherwise continue current medications.  10. Hyperlipidemia: Most recent LDL of 60 from 06/2018.  We are checking a CMP as outlined above given his mildly elevated LFTs.  Currently, he remains on atorvastatin 40 mg daily.  Disposition: F/u with Dr. Caryl Comes in 4 weeks.  Current medicines are reviewed at length with the patient today.  The patient did not have any concerns regarding medicines.  Signed, Christell Faith, PA-C 11/19/2018 2:26 PM     [facility] [address] [place] [address] [place], [place] [address] [phone]

## 2018-11-18 NOTE — Telephone Encounter (Signed)
°  1. Has your device fired? No   2. Is you device beeping? No   3. Are you experiencing draining or swelling at device site? No   4. Are you calling to see if we received your device transmission? Yes   5. Have you passed out? No   Patient had to move Home remote device and wants to know if it is working and who gets the report when sent     Please route to [place]

## 2018-11-19 ENCOUNTER — Encounter: Payer: Self-pay | Admitting: Physician Assistant

## 2018-11-19 ENCOUNTER — Ambulatory Visit (INDEPENDENT_AMBULATORY_CARE_PROVIDER_SITE_OTHER): Payer: PPO | Admitting: Physician Assistant

## 2018-11-19 ENCOUNTER — Other Ambulatory Visit: Payer: Self-pay

## 2018-11-19 ENCOUNTER — Ambulatory Visit (INDEPENDENT_AMBULATORY_CARE_PROVIDER_SITE_OTHER): Payer: PPO

## 2018-11-19 VITALS — BP 140/88 | HR 80 | Ht 69.5 in | Wt 216.0 lb

## 2018-11-19 DIAGNOSIS — R740 Nonspecific elevation of levels of transaminase and lactic acid dehydrogenase [LDH]: Secondary | ICD-10-CM

## 2018-11-19 DIAGNOSIS — I25118 Atherosclerotic heart disease of native coronary artery with other forms of angina pectoris: Secondary | ICD-10-CM | POA: Diagnosis not present

## 2018-11-19 DIAGNOSIS — I442 Atrioventricular block, complete: Secondary | ICD-10-CM | POA: Diagnosis not present

## 2018-11-19 DIAGNOSIS — E785 Hyperlipidemia, unspecified: Secondary | ICD-10-CM | POA: Diagnosis not present

## 2018-11-19 DIAGNOSIS — N179 Acute kidney failure, unspecified: Secondary | ICD-10-CM | POA: Diagnosis not present

## 2018-11-19 DIAGNOSIS — Z951 Presence of aortocoronary bypass graft: Secondary | ICD-10-CM

## 2018-11-19 DIAGNOSIS — N183 Chronic kidney disease, stage 3 unspecified: Secondary | ICD-10-CM

## 2018-11-19 DIAGNOSIS — R7401 Elevation of levels of liver transaminase levels: Secondary | ICD-10-CM

## 2018-11-19 DIAGNOSIS — I82622 Acute embolism and thrombosis of deep veins of left upper extremity: Secondary | ICD-10-CM | POA: Diagnosis not present

## 2018-11-19 DIAGNOSIS — I255 Ischemic cardiomyopathy: Secondary | ICD-10-CM | POA: Diagnosis not present

## 2018-11-19 DIAGNOSIS — I2510 Atherosclerotic heart disease of native coronary artery without angina pectoris: Secondary | ICD-10-CM | POA: Diagnosis not present

## 2018-11-19 DIAGNOSIS — N189 Chronic kidney disease, unspecified: Secondary | ICD-10-CM

## 2018-11-19 DIAGNOSIS — E875 Hyperkalemia: Secondary | ICD-10-CM | POA: Diagnosis not present

## 2018-11-19 DIAGNOSIS — Z950 Presence of cardiac pacemaker: Secondary | ICD-10-CM | POA: Diagnosis not present

## 2018-11-19 DIAGNOSIS — I5022 Chronic systolic (congestive) heart failure: Secondary | ICD-10-CM | POA: Diagnosis not present

## 2018-11-19 DIAGNOSIS — I10 Essential (primary) hypertension: Secondary | ICD-10-CM | POA: Diagnosis not present

## 2018-11-19 NOTE — Patient Instructions (Signed)
Medication Instructions:  We will call you Thursday with any medication changes.   If you need a refill on your cardiac medications before your next appointment, please call your pharmacy.   Lab work: CMET today and will call you with results and recommendations on medications.  If you have labs (blood work) drawn today and your tests are completely normal, you will receive your results only by: Marland Kitchen MyChart Message (if you have MyChart) OR . A paper copy in the mail If you have any lab test that is abnormal or we need to change your treatment, we will call you to review the results.  Testing/Procedures: None  Follow-Up: At [facility], you and your health needs are our priority.  As part of our continuing mission to provide you with exceptional heart care, we have created designated Provider Care Teams.  These Care Teams include your primary Cardiologist (physician) and Advanced Practice Providers (APPs -  Physician Assistants and Nurse Practitioners) who all work together to provide you with the care you need, when you need it. You will need a follow up appointment in 2-4 weeks with Dr. Caryl Comes.    Murray Hodgkins, NP Christell Faith, PA-C . Marrianne Mood, PA-C

## 2018-11-20 LAB — COMPREHENSIVE METABOLIC PANEL
ALT: 57 IU/L — ABNORMAL HIGH (ref 0–44)
AST: 66 IU/L — ABNORMAL HIGH (ref 0–40)
Albumin/Globulin Ratio: 2 (ref 1.2–2.2)
Albumin: 4.3 g/dL (ref 3.5–4.8)
Alkaline Phosphatase: 252 IU/L — ABNORMAL HIGH (ref 39–117)
BUN/Creatinine Ratio: 18 (ref 10–24)
BUN: 25 mg/dL (ref 8–27)
Bilirubin Total: 0.8 mg/dL (ref 0.0–1.2)
CO2: 25 mmol/L (ref 20–29)
Calcium: 9.6 mg/dL (ref 8.6–10.2)
Chloride: 100 mmol/L (ref 96–106)
Creatinine, Ser: 1.41 mg/dL — ABNORMAL HIGH (ref 0.76–1.27)
GFR calc Af Amer: 56 mL/min/{1.73_m2} — ABNORMAL LOW (ref >59)
GFR calc non Af Amer: 48 mL/min/{1.73_m2} — ABNORMAL LOW (ref >59)
Globulin, Total: 2.1 g/dL (ref 1.5–4.5)
Glucose: 83 mg/dL (ref 65–99)
Potassium: 4.9 mmol/L (ref 3.5–5.2)
Sodium: 139 mmol/L (ref 134–144)
Total Protein: 6.4 g/dL (ref 6.0–8.5)

## 2018-11-21 ENCOUNTER — Encounter: Payer: Self-pay | Admitting: Cardiothoracic Surgery

## 2018-11-21 ENCOUNTER — Ambulatory Visit (INDEPENDENT_AMBULATORY_CARE_PROVIDER_SITE_OTHER): Payer: Self-pay | Admitting: Cardiothoracic Surgery

## 2018-11-21 ENCOUNTER — Other Ambulatory Visit: Payer: Self-pay

## 2018-11-21 ENCOUNTER — Telehealth: Payer: Self-pay

## 2018-11-21 VITALS — BP 147/87 | HR 80 | Resp 16 | Ht 69.5 in | Wt 216.0 lb

## 2018-11-21 DIAGNOSIS — Z09 Encounter for follow-up examination after completed treatment for conditions other than malignant neoplasm: Secondary | ICD-10-CM

## 2018-11-21 DIAGNOSIS — I2510 Atherosclerotic heart disease of native coronary artery without angina pectoris: Secondary | ICD-10-CM

## 2018-11-21 DIAGNOSIS — I5022 Chronic systolic (congestive) heart failure: Secondary | ICD-10-CM

## 2018-11-21 MED ORDER — LISINOPRIL 5 MG PO TABS: 5.0000 mg | ORAL_TABLET | Freq: Every day | ORAL | 2 refills | Status: DC

## 2018-11-21 NOTE — Progress Notes (Signed)
PCP is [name], MD Referring Provider is Wellington Hampshire, MD  Chief Complaint  Patient presents with  . Routine Post Op    s/p Revision of pacemaker lead pocket, left chest 11/07/2018    HPI: Scheduled postop visit after revision of left ventricular epicardial pacing lead in a pacemaker pocket.  Incision is well-healed and the lead is very beneath the pectoralis muscle. Patient had a follow-up echocardiogram which shows his EF remains in the 25-30% range so the epicardial lead may be used in a CRT configuration. Otherwise the patient is doing well without symptoms of CHF or angina.  He is encouraged to follow a heart healthy lifestyle and heart healthy diet.  His CABG procedure was in mid October so he needs to continue sternal precautions until February 1.   Past Medical History:  Diagnosis Date  . Arthritis   . Asthma   . CAD (coronary artery disease)    a. LHC 7/19: ostLM 30%, ostLAD 30%, p-mLAD 99% mod calcified, ostD1 90%, ost-pLCx 80% ulcerative & mod calcified, p-mLCx 50%, OM2 50%, mod dz LPDA, RCA small w/ diffuse dz throughout; b. recommendation of CABG; c. 3-V CABG 08/26/18 (LIMA-LAD, VG-Diag, VG-LCx)  . Chronic systolic CHF (congestive heart failure) (HCC)    a. EF 35-40%, diffuse HK with HK of the apical, anteroseptal, and anterior myocardium, Gr1DD, mild MR, mildly dilated LA, pacer wire noted in the RV with nl RVSF, PASP nl  . CKD (chronic kidney disease), stage III ([place])   . Deep vein thrombosis (DVT) of upper extremity ([place])    a. DVT of left subclavian dx'd 06/17/2018; b. Eliquis  . GERD (gastroesophageal reflux disease)    takes otc occasionally  . History of complete heart block    a. s/p MDT PPM 05/2017  . Hypertension   . Obesity   . Presence of permanent cardiac pacemaker    Dr. Beckie Salts implanted 05/2017  . PVC's (premature ventricular contractions)    a. PPM-induced    Past Surgical History:  Procedure Laterality Date  . BACK SURGERY    . CORONARY  ARTERY BYPASS GRAFT N/A 08/26/2018   Procedure: CORONARY ARTERY BYPASS GRAFTING (CABG) x3 , Using left internal mammory artery and right leg greater saphronious vein. Harvested endoscopically.;  Surgeon: Ivin Poot, MD;  Location: [place];  Service: Open Heart Surgery;  Laterality: N/A;  . EPICARDIAL PACING LEAD PLACEMENT N/A 08/26/2018   Procedure: LV PACING LEAD PLACEMENT;  Surgeon: Ivin Poot, MD;  Location: [place];  Service: Thoracic;  Laterality: N/A;  . EPICARDIAL PACING LEAD PLACEMENT Left 11/07/2018   Procedure: REVISE PACEMAKER LEAD LEFT CHEST;  Surgeon: Ivin Poot, MD;  Location: [place];  Service: Thoracic;  Laterality: Left;  . INSERT / REPLACE / REMOVE PACEMAKER     [employer]TRONIC  . LEFT HEART CATH AND CORONARY ANGIOGRAPHY N/A 06/13/2018   Procedure: LEFT HEART CATH AND CORONARY ANGIOGRAPHY;  Surgeon: Wellington Hampshire, MD;  Location: [place] CV LAB;  Service: Cardiovascular;  Laterality: N/A;  . LEG SURGERY    . PACEMAKER IMPLANT N/A 06/11/2017   Procedure: Pacemaker Implant;  Surgeon: Evans Lance, MD;  Location: [place] [facility];  Service: Cardiovascular;  Laterality: N/A;  . PLEURAL EFFUSION DRAINAGE Left 09/24/2018   Procedure: DRAINAGE OF LOCULATED PLEURAL EFFUSION;  Surgeon: Ivin Poot, MD;  Location: [place];  Service: Thoracic;  Laterality: Left;  . TEE WITHOUT CARDIOVERSION N/A 08/26/2018   Procedure: TRANSESOPHAGEAL ECHOCARDIOGRAM (TEE);  Surgeon: Prescott Gum, Collier Salina, MD;  Location: [place];  Service: Open Heart Surgery;  Laterality: N/A;  . VIDEO ASSISTED THORACOSCOPY Left 09/24/2018   Procedure: VIDEO ASSISTED THORACOSCOPY;  Surgeon: Prescott Gum, Collier Salina, MD;  Location: [facility] OR;  Service: Thoracic;  Laterality: Left;    Family History  Problem Relation Age of Onset  . Stroke Mother   . Stroke Father   . Alcohol abuse Father   . Diabetes Brother   . Alcohol abuse Brother   . Stroke Maternal Grandfather     Social History Social History   Tobacco Use   . Smoking status: Never Smoker  . Smokeless tobacco: Never Used  Substance Use Topics  . Alcohol use: Yes    Comment: occ.  . Drug use: No    Current Outpatient Medications  Medication Sig Dispense Refill  . albuterol (PROVENTIL HFA;VENTOLIN HFA) 108 (90 Base) MCG/ACT inhaler Inhale 1-2 puffs into the lungs every 4 (four) hours as needed for wheezing or shortness of breath.    . allopurinol (ZYLOPRIM) 100 MG tablet Take 100 mg by mouth 2 (two) times daily.     Marland Kitchen aspirin EC 81 MG tablet Take 81 mg by mouth daily.    Marland Kitchen atorvastatin (LIPITOR) 40 MG tablet Take 1 tablet (40 mg total) by mouth daily. 90 tablet 3  . Fluticasone-Salmeterol (ADVAIR) 100-50 MCG/DOSE AEPB Inhale 1 puff into the lungs daily as needed (shortness of breath). 60 each   . metoprolol succinate (TOPROL-XL) 25 MG 24 hr tablet Take 1 tablet (25 mg total) by mouth daily. 30 tablet 11  . Multiple Vitamin (MULTIVITAMIN) tablet Take 1 tablet by mouth daily.    Marland Kitchen Specialty Vitamins Products (PROSTATE PO) Take 1 tablet by mouth daily.    Marland Kitchen spironolactone (ALDACTONE) 25 MG tablet Take 0.5 tablets (12.5 mg total) by mouth daily. 30 tablet 6   No current facility-administered medications for this visit.     Allergies  Allergen Reactions  . Azithromycin Hives  . Erythromycin Hives    Review of Systems  No complaints  BP (!) 147/87 (BP Location: Right Arm, Patient Position: Sitting, Cuff Size: Large)   Pulse 80   Resp 16   Ht 5' 9.5" (1.765 m)   Wt 216 lb (98 kg)   SpO2 99% Comment: RA  BMI 31.44 kg/m  Physical Exam Alert and comfortable Heart rhythm regular No murmur Left pacemaker pocket incision clean and dry Minimal peripheral edema Lungs clear  Diagnostic Tests: Images of recent echocardiogram performed November 19, 2018 personally reviewed  Impression: Ischemic cardiomyopathy Doing well clinically after CABG but without improvement in LVEF  Plan: Heart healthy diet and heart healthy lifestyle and  compliance with medications emphasized the patient.  I will see him back in 2 months to follow his progress.   Len Childs, MD [facility] [phone]

## 2018-11-21 NOTE — Telephone Encounter (Signed)
Spoke to patient. He verbalized understanding of POC. New medication order placed.   Lab orders placed. Pt cooperative with POC.   Advised pt to call for any further questions or concerns

## 2018-11-21 NOTE — Telephone Encounter (Signed)
-----   Message from Rise Mu, PA-C sent at 11/20/2018 11:29 AM EST ----- Renal function continues to improve, back to his ~ baseline.  Potassium is high-end of normal.  LFT remains elevated, though improving.   Please start lisinopril 5 mg daily. He will need a follow up BMP in 1 week to assess renal function and potassium on ACEi.  We should follow up a LFT in 4 weeks to assess his liver function to evaluate for continued improvement. If his LFTs remain elevated at that time, we may have to stop his Lipitor.

## 2018-11-26 ENCOUNTER — Ambulatory Visit (INDEPENDENT_AMBULATORY_CARE_PROVIDER_SITE_OTHER): Payer: PPO | Admitting: Internal Medicine

## 2018-11-26 ENCOUNTER — Encounter: Payer: Self-pay | Admitting: Internal Medicine

## 2018-11-26 VITALS — BP 140/80 | HR 80 | Ht 69.5 in | Wt 213.0 lb

## 2018-11-26 DIAGNOSIS — Z950 Presence of cardiac pacemaker: Secondary | ICD-10-CM | POA: Diagnosis not present

## 2018-11-26 DIAGNOSIS — I442 Atrioventricular block, complete: Secondary | ICD-10-CM

## 2018-11-26 NOTE — Progress Notes (Signed)
Patient Care Team: [name], MD as PCP - General (Family Medicine)   HPI  Sean [name]. is a 78 y.o. male Seen in followup for His pacing complicated lead induced PVCs initially at >9 %    No  EF assessment prior to device implantation   DATE TEST EF   9/15 Myoview 55-65% No ischemia  10/18 Echo   50-55 %   5/19 Echo   30-35 %   7/19 CTA  Prob 3 V CAD  7/19 LHC  3V CAD  12/19 Echo  20-25%     Post cath he developed a left upper extremity DVT and was started on Eliquis >> this resulted in deferral of his plan for bypass then done early September with placing of a LV epicardial lead  He is also had problems with orthostatic intolerance requiring down titration of medications.  11/19 he had a near syncopal episode I   Imaging demonstrated a large hemomothorax attributed per the family to a slow leak following his bypass surgery.  He was transferred to [facility] and underwent VATS for drainage and decortication.  Interval Echo showed worsening LV function As above   When I saw him 11/19 he was also complaining of pain along his tracked pacemaker lead.  I reached out to Dr. PVT who saw him and then felt that surgical revision was in order.  Accomplished 12/19 according to the patient, Dr. PVT would like [place] to delay for a couple of months prior to re-instrument in the pocket.  He has significant shortness of breath.  At less than 1 or 2 minutes.  Some intermittent chest pains.  No edema. Records and Results Reviewed   Past Medical History:  Diagnosis Date  . Arthritis   . Asthma   . CAD (coronary artery disease)    a. LHC 7/19: ostLM 30%, ostLAD 30%, p-mLAD 99% mod calcified, ostD1 90%, ost-pLCx 80% ulcerative & mod calcified, p-mLCx 50%, OM2 50%, mod dz LPDA, RCA small w/ diffuse dz throughout; b. recommendation of CABG; c. 3-V CABG 08/26/18 (LIMA-LAD, VG-Diag, VG-LCx)  . Chronic systolic CHF (congestive heart failure) (HCC)    a. EF 35-40%, diffuse HK with HK of the  apical, anteroseptal, and anterior myocardium, Gr1DD, mild MR, mildly dilated LA, pacer wire noted in the RV with nl RVSF, PASP nl  . CKD (chronic kidney disease), stage III ([place])   . Deep vein thrombosis (DVT) of upper extremity ([place])    a. DVT of left subclavian dx'd 06/17/2018; b. Eliquis  . GERD (gastroesophageal reflux disease)    takes otc occasionally  . History of complete heart block    a. s/p MDT PPM 05/2017  . Hypertension   . Obesity   . Presence of permanent cardiac pacemaker    Dr. Beckie Salts implanted 05/2017  . PVC's (premature ventricular contractions)    a. PPM-induced    Past Surgical History:  Procedure Laterality Date  . BACK SURGERY    . CORONARY ARTERY BYPASS GRAFT N/A 08/26/2018   Procedure: CORONARY ARTERY BYPASS GRAFTING (CABG) x3 , Using left internal mammory artery and right leg greater saphronious vein. Harvested endoscopically.;  Surgeon: Ivin Poot, MD;  Location: [place];  Service: Open Heart Surgery;  Laterality: N/A;  . EPICARDIAL PACING LEAD PLACEMENT N/A 08/26/2018   Procedure: LV PACING LEAD PLACEMENT;  Surgeon: Ivin Poot, MD;  Location: [place];  Service: Thoracic;  Laterality: N/A;  . EPICARDIAL PACING LEAD PLACEMENT  Left 11/07/2018   Procedure: REVISE PACEMAKER LEAD LEFT CHEST;  Surgeon: Ivin Poot, MD;  Location: [facility];  Service: Thoracic;  Laterality: Left;  . INSERT / REPLACE / REMOVE PACEMAKER     [employer]  . LEFT HEART CATH AND CORONARY ANGIOGRAPHY N/A 06/13/2018   Procedure: LEFT HEART CATH AND CORONARY ANGIOGRAPHY;  Surgeon: Wellington Hampshire, MD;  Location: [place] [facility];  Service: Cardiovascular;  Laterality: N/A;  . LEG SURGERY    . PACEMAKER IMPLANT N/A 06/11/2017   Procedure: Pacemaker Implant;  Surgeon: Evans Lance, MD;  Location: [place] [facility];  Service: Cardiovascular;  Laterality: N/A;  . PLEURAL EFFUSION DRAINAGE Left 09/24/2018   Procedure: DRAINAGE OF LOCULATED PLEURAL EFFUSION;  Surgeon: Ivin Poot, MD;  Location: [place];  Service: Thoracic;  Laterality: Left;  . TEE WITHOUT CARDIOVERSION N/A 08/26/2018   Procedure: TRANSESOPHAGEAL ECHOCARDIOGRAM (TEE);  Surgeon: Prescott Gum, Collier Salina, MD;  Location: [place];  Service: Open Heart Surgery;  Laterality: N/A;  . VIDEO ASSISTED THORACOSCOPY Left 09/24/2018   Procedure: VIDEO ASSISTED THORACOSCOPY;  Surgeon: Prescott Gum, Collier Salina, MD;  Location: [facility];  Service: Thoracic;  Laterality: Left;    Current Outpatient Medications  Medication Sig Dispense Refill  . albuterol (PROVENTIL HFA;VENTOLIN HFA) 108 (90 Base) MCG/ACT inhaler Inhale 1-2 puffs into the lungs every 4 (four) hours as needed for wheezing or shortness of breath.    . allopurinol (ZYLOPRIM) 100 MG tablet Take 100 mg by mouth 2 (two) times daily.     Marland Kitchen aspirin EC 81 MG tablet Take 81 mg by mouth daily.    . Fluticasone-Salmeterol (ADVAIR) 100-50 MCG/DOSE AEPB Inhale 1 puff into the lungs daily as needed (shortness of breath). 60 each   . lisinopril (PRINIVIL,ZESTRIL) 5 MG tablet Take 1 tablet (5 mg total) by mouth daily. 30 tablet 2  . metoprolol succinate (TOPROL-XL) 25 MG 24 hr tablet Take 1 tablet (25 mg total) by mouth daily. 30 tablet 11  . Multiple Vitamin (MULTIVITAMIN) tablet Take 1 tablet by mouth daily.    Marland Kitchen Specialty Vitamins Products (PROSTATE PO) Take 1 tablet by mouth daily.    Marland Kitchen spironolactone (ALDACTONE) 25 MG tablet Take 0.5 tablets (12.5 mg total) by mouth daily. 30 tablet 6  . atorvastatin (LIPITOR) 40 MG tablet Take 1 tablet (40 mg total) by mouth daily. 90 tablet 3   No current facility-administered medications for this visit.     Allergies  Allergen Reactions  . Azithromycin Hives  . Erythromycin Hives      Review of Systems negative except from HPI and PMH  Physical Exam BP 140/80 (BP Location: Left Arm, Patient Position: Sitting, Cuff Size: Normal)   Pulse 80   Ht 5' 9.5" (1.765 m)   Wt 213 lb (96.6 kg)   BMI 31.00 kg/m  Well developed and nourished  in no acute distress HENT normal Neck supple with JVP-flat Clear Device pocket well healed; without hematoma or erythema.  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  We will begin him on Aldactone 12.5 mg.  We will need to follow his metabolic profile.  With interval worsening of LV function and significant exercise intolerance, think it would be appropriate to undertake utilization of the previously implanted epicardial LV lead.  X-ray was reviewed today and demonstrates that is quite posterior.  Per the patient, Dr. Darcey Nora would like [place] to wait for a few months; we will see him in March and anticipate proceeding at that time.  For now we will continue his current medications and will reach out to Dr. Gaylyn Cheers concerning Delene Loll  PVCs not evident on his device  More than 50% of 40 min was spent in counseling related to the above

## 2018-11-26 NOTE — Patient Instructions (Signed)
Medication Instructions:  - Your physician recommends that you continue on your current medications as directed. Please refer to the Current Medication list given to you today.  If you need a refill on your cardiac medications before your next appointment, please call your pharmacy.   Lab work: - none ordered  If you have labs (blood work) drawn today and your tests are completely normal, you will receive your results only by: Marland Kitchen MyChart Message (if you have MyChart) OR . A paper copy in the mail If you have any lab test that is abnormal or we need to change your treatment, we will call you to review the results.  Testing/Procedures: - none ordered  Follow-Up: At [facility], you and your health needs are our priority.  As part of our continuing mission to provide you with exceptional heart care, we have created designated Provider Care Teams.  These Care Teams include your primary Cardiologist (physician) and Advanced Practice Providers (APPs -  Physician Assistants and Nurse Practitioners) who all work together to provide you with the care you need, when you need it. . in 2 months with Dr. Caryl Comes  Any Other Special Instructions Will Be Listed Below (If Applicable). - N/A

## 2018-12-02 ENCOUNTER — Telehealth: Payer: Self-pay

## 2018-12-02 NOTE — Progress Notes (Signed)
Can u recheck in am EW  See if it has converted spontaneously

## 2018-12-02 NOTE — Progress Notes (Signed)
Alert received from patients pacemaker. Pt has been in AF since Jan. 11, 2020 w/ paced ventricular rate.

## 2018-12-03 ENCOUNTER — Telehealth: Payer: Self-pay | Admitting: Internal Medicine

## 2018-12-03 DIAGNOSIS — I10 Essential (primary) hypertension: Secondary | ICD-10-CM | POA: Diagnosis not present

## 2018-12-03 DIAGNOSIS — M1A9XX0 Chronic gout, unspecified, without tophus (tophi): Secondary | ICD-10-CM | POA: Diagnosis not present

## 2018-12-03 DIAGNOSIS — I25810 Atherosclerosis of coronary artery bypass graft(s) without angina pectoris: Secondary | ICD-10-CM | POA: Diagnosis not present

## 2018-12-03 DIAGNOSIS — R351 Nocturia: Secondary | ICD-10-CM | POA: Diagnosis not present

## 2018-12-03 DIAGNOSIS — Z79899 Other long term (current) drug therapy: Secondary | ICD-10-CM

## 2018-12-03 DIAGNOSIS — I5022 Chronic systolic (congestive) heart failure: Secondary | ICD-10-CM

## 2018-12-03 DIAGNOSIS — E785 Hyperlipidemia, unspecified: Secondary | ICD-10-CM | POA: Diagnosis not present

## 2018-12-03 MED ORDER — SACUBITRIL-VALSARTAN 24-26 MG PO TABS: 1.0000 | ORAL_TABLET | Freq: Two times a day (BID) | ORAL | 3 refills | Status: DC

## 2018-12-03 NOTE — Telephone Encounter (Signed)
Wellington Hampshire, MD  Deboraha Sprang, MD; Emily Filbert, RN        Yes I think we should switch him from lisinopril to [facility] with close monitoring of renal function and potassium.   Heather,  Can you please start him on Entresto 24/26 mg twice daily 36 hours after stopping lisinopril?. Check basic metabolic profile 1 week after the initiation.   Thanks.   Previous Messages    ----- Message -----  From: Deboraha Sprang, MD  Sent: 11/26/2018  1:14 PM EST  To: Wellington Hampshire, MD   Should we start entresto

## 2018-12-03 NOTE — Telephone Encounter (Signed)
I spoke with the patient and his wife. I have advised him that Dr. Fletcher Anon and Dr. Caryl Comes are in agreement with him starting entresto 24/26 mg BID.  The patient's wife is aware that he will need to:  1) STOP lisinopril x 36 hours prior to staring entresto,  2) START entresto 24/26 mg BID- I advised the patient to start this on Friday morning 1/17 (he did take lisinopril today) 3) have a repeat BMP in 1 week  I have advised I will pull samples of the Entresto to start with. Per Mrs. Florene Glen, the patient will pick this up tomorrow. I will pull a 30 day free trial card to have him use as well. Written instructions will be provided to the patient.   Samples given: Entresto 24/26 mg Lot: [id number] Exp: 3/22 # 1 bottle given  The patient is also aware that his most recent transmission showed that he has been in atrial fibrillation since 11/30/2018. I reviewed this with Dr. Caryl Comes per the device clinic- per Dr. Caryl Comes, the patient will need to transmit from home to confirm if he is still out of rhythm. Mrs. Kaucher has been notified and will advise the patient.   I will forward a message to the Device pool to let them know to look out for this.

## 2018-12-03 NOTE — Telephone Encounter (Signed)
Wanda Plump, RN  Dollene Primrose, RN; Emily Filbert, RN        I sent SK a high priority phone note, this pt has been in AF since 11/30/2018. He has a complicated hx I also sent this to [employer]

## 2018-12-04 ENCOUNTER — Encounter: Payer: Self-pay | Admitting: *Deleted

## 2018-12-04 ENCOUNTER — Telehealth: Payer: Self-pay | Admitting: *Deleted

## 2018-12-04 NOTE — Telephone Encounter (Signed)
[place]. [phone] I spoke to begin Meghan who will be forwarding  PA initiation via fax. ID# [id number]

## 2018-12-04 NOTE — Telephone Encounter (Signed)
Spoke with Sean [name] to advise that we have not yet received a manual transmission to reassess his heart rhythm. Patient reports he was not aware to send one. He agrees to send a transmission from his monitor when he returns home.

## 2018-12-04 NOTE — Telephone Encounter (Addendum)
Received Prior Authorization Request form  from [place] for Entresto 24-26 mg. Form completed and faxed to [phone]. Faxed form placed in file cabinet in drug closet.

## 2018-12-04 NOTE — Telephone Encounter (Signed)
PA request received by [employer] for Entresto. Submitted PA: Eligibility could not be verified for pt. I will contact pharmacy for more information.

## 2018-12-05 NOTE — Telephone Encounter (Addendum)
Per [employer] Entresto 24mg -26 mg has been approved through 12/04/2019. Patient notified of approval.  The patient will contact his pharmacy.

## 2018-12-05 NOTE — Telephone Encounter (Signed)
Remote transmission received, A-Flutter episode still in progress since 12/02/2018

## 2018-12-05 NOTE — Telephone Encounter (Signed)
Apt scheduled with AF clinic

## 2018-12-05 NOTE — Progress Notes (Signed)
Apt scheduled with AF clinic

## 2018-12-05 NOTE — Telephone Encounter (Signed)
LMOVM for pt to send a manual transmission w/ his home monitor.

## 2018-12-05 NOTE — Telephone Encounter (Signed)
Spoke with pt informed him that he was in a rhythm called atrial fib and this could put him at risk for a stroke informed him that Dr. Caryl Comes had reviewed this and recommended that he f/u with the atrial fib pt agreeable to apt with AF clinic on 12/09/2018 at 2:00pm, number to AF clinic given.

## 2018-12-09 ENCOUNTER — Ambulatory Visit (HOSPITAL_COMMUNITY): Payer: PPO | Admitting: Nurse Practitioner

## 2018-12-10 ENCOUNTER — Telehealth: Payer: Self-pay

## 2018-12-10 ENCOUNTER — Ambulatory Visit (HOSPITAL_COMMUNITY)
Admission: RE | Admit: 2018-12-10 | Discharge: 2018-12-10 | Disposition: A | Discharge status: Home / Self Care | Payer: PPO | Source: Ambulatory Visit | Attending: Nurse Practitioner | Admitting: Nurse Practitioner

## 2018-12-10 VITALS — BP 114/62 | HR 81 | Ht 69.5 in | Wt 216.0 lb

## 2018-12-10 DIAGNOSIS — Z79899 Other long term (current) drug therapy: Secondary | ICD-10-CM | POA: Insufficient documentation

## 2018-12-10 DIAGNOSIS — I4891 Unspecified atrial fibrillation: Secondary | ICD-10-CM | POA: Insufficient documentation

## 2018-12-10 DIAGNOSIS — I5022 Chronic systolic (congestive) heart failure: Secondary | ICD-10-CM | POA: Diagnosis not present

## 2018-12-10 DIAGNOSIS — J45909 Unspecified asthma, uncomplicated: Secondary | ICD-10-CM | POA: Insufficient documentation

## 2018-12-10 DIAGNOSIS — N183 Chronic kidney disease, stage 3 (moderate): Secondary | ICD-10-CM | POA: Diagnosis not present

## 2018-12-10 DIAGNOSIS — Z951 Presence of aortocoronary bypass graft: Secondary | ICD-10-CM | POA: Insufficient documentation

## 2018-12-10 DIAGNOSIS — I130 Hypertensive heart and chronic kidney disease with heart failure and stage 1 through stage 4 chronic kidney disease, or unspecified chronic kidney disease: Secondary | ICD-10-CM | POA: Insufficient documentation

## 2018-12-10 DIAGNOSIS — Z833 Family history of diabetes mellitus: Secondary | ICD-10-CM | POA: Insufficient documentation

## 2018-12-10 DIAGNOSIS — Z7982 Long term (current) use of aspirin: Secondary | ICD-10-CM | POA: Diagnosis not present

## 2018-12-10 DIAGNOSIS — Z7901 Long term (current) use of anticoagulants: Secondary | ICD-10-CM | POA: Insufficient documentation

## 2018-12-10 DIAGNOSIS — Z881 Allergy status to other antibiotic agents status: Secondary | ICD-10-CM | POA: Insufficient documentation

## 2018-12-10 DIAGNOSIS — E669 Obesity, unspecified: Secondary | ICD-10-CM | POA: Diagnosis not present

## 2018-12-10 DIAGNOSIS — I2510 Atherosclerotic heart disease of native coronary artery without angina pectoris: Secondary | ICD-10-CM | POA: Diagnosis not present

## 2018-12-10 DIAGNOSIS — Z950 Presence of cardiac pacemaker: Secondary | ICD-10-CM | POA: Diagnosis not present

## 2018-12-10 DIAGNOSIS — R9431 Abnormal electrocardiogram [ECG] [EKG]: Secondary | ICD-10-CM | POA: Diagnosis not present

## 2018-12-10 DIAGNOSIS — Z6831 Body mass index (BMI) 31.0-31.9, adult: Secondary | ICD-10-CM | POA: Insufficient documentation

## 2018-12-10 MED ORDER — APIXABAN 5 MG PO TABS: 5.0000 mg | ORAL_TABLET | Freq: Two times a day (BID) | ORAL | 6 refills | Status: DC

## 2018-12-10 MED ORDER — APIXABAN 5 MG PO TABS: 5.0000 mg | ORAL_TABLET | Freq: Two times a day (BID) | ORAL | 0 refills | Status: DC

## 2018-12-10 MED ORDER — ASPIRIN EC 81 MG PO TBEC: 81.0000 mg | DELAYED_RELEASE_TABLET | Freq: Every day | ORAL | Status: DC

## 2018-12-10 NOTE — Patient Instructions (Signed)
Start Eliquis 5mg  twice a day  Scheduling will call with follow up with Dr. Caryl Comes in [place] in 3 weeks.

## 2018-12-10 NOTE — Telephone Encounter (Signed)
-----   Message from Juluis Mire, RN sent at 12/10/2018  3:19 PM EST ----- Regarding: appt w klein Pt needs an appt with dr Caryl Comes in 3 weeks for follow up per donna carroll np =- please call pt to set up. Thanks![facility]

## 2018-12-10 NOTE — Telephone Encounter (Signed)
Attempted to call patient, VM not set up yet

## 2018-12-11 NOTE — Progress Notes (Signed)
Primary Care Physician: [name], MD Referring Physician: Dr. Doyce Para. is a 78 y.o. male with a h/o CAD, LV dysfunction,CKD, PPM 2/2 CHB, here for new onset aifb detected on device present since 1/11. He is not aware of any irregularity. He has a h/o of DVT in left subclavian in 05/2018. Was treated with eliquis for a period of time. Had bypass 08/2018 was placed on warfarin. This this was stopped after he had was found to have a  hemothorax  on the left which was drained. No other bleeding history. EKG shows paced rhythm at 81 bpm. He is not drinking alcohol, no tobacco or excessive caffeine, no recent illness,or other known trigger.  Today, he denies symptoms of palpitations, chest pain, shortness of breath, orthopnea, PND, lower extremity edema, dizziness, presyncope, syncope, or neurologic sequela. The patient is tolerating medications without difficulties and is otherwise without complaint today.   Past Medical History:  Diagnosis Date  . Arthritis   . Asthma   . CAD (coronary artery disease)    a. LHC 7/19: ostLM 30%, ostLAD 30%, p-mLAD 99% mod calcified, ostD1 90%, ost-pLCx 80% ulcerative & mod calcified, p-mLCx 50%, OM2 50%, mod dz LPDA, RCA small w/ diffuse dz throughout; b. recommendation of CABG; c. 3-V CABG 08/26/18 (LIMA-LAD, VG-Diag, VG-LCx)  . Chronic systolic CHF (congestive heart failure) (HCC)    a. EF 35-40%, diffuse HK with HK of the apical, anteroseptal, and anterior myocardium, Gr1DD, mild MR, mildly dilated LA, pacer wire noted in the RV with nl RVSF, PASP nl  . CKD (chronic kidney disease), stage III ([place])   . Deep vein thrombosis (DVT) of upper extremity ([place])    a. DVT of left subclavian dx'd 06/17/2018; b. Eliquis  . GERD (gastroesophageal reflux disease)    takes otc occasionally  . History of complete heart block    a. s/p MDT PPM 05/2017  . Hypertension   . Obesity   . Presence of permanent cardiac pacemaker    Dr. Beckie Salts implanted  05/2017  . PVC's (premature ventricular contractions)    a. PPM-induced   Past Surgical History:  Procedure Laterality Date  . BACK SURGERY    . CORONARY ARTERY BYPASS GRAFT N/A 08/26/2018   Procedure: CORONARY ARTERY BYPASS GRAFTING (CABG) x3 , Using left internal mammory artery and right leg greater saphronious vein. Harvested endoscopically.;  Surgeon: Ivin Poot, MD;  Location: [facility];  Service: Open Heart Surgery;  Laterality: N/A;  . EPICARDIAL PACING LEAD PLACEMENT N/A 08/26/2018   Procedure: LV PACING LEAD PLACEMENT;  Surgeon: Ivin Poot, MD;  Location: [place];  Service: Thoracic;  Laterality: N/A;  . EPICARDIAL PACING LEAD PLACEMENT Left 11/07/2018   Procedure: REVISE PACEMAKER LEAD LEFT CHEST;  Surgeon: Ivin Poot, MD;  Location: [place];  Service: Thoracic;  Laterality: Left;  . INSERT / REPLACE / REMOVE PACEMAKER     [employer]  . LEFT HEART CATH AND CORONARY ANGIOGRAPHY N/A 06/13/2018   Procedure: LEFT HEART CATH AND CORONARY ANGIOGRAPHY;  Surgeon: Wellington Hampshire, MD;  Location: [place] [facility];  Service: Cardiovascular;  Laterality: N/A;  . LEG SURGERY    . PACEMAKER IMPLANT N/A 06/11/2017   Procedure: Pacemaker Implant;  Surgeon: Evans Lance, MD;  Location: [place] [facility];  Service: Cardiovascular;  Laterality: N/A;  . PLEURAL EFFUSION DRAINAGE Left 09/24/2018   Procedure: DRAINAGE OF LOCULATED PLEURAL EFFUSION;  Surgeon: Ivin Poot, MD;  Location:  [facility];  Service: Thoracic;  Laterality: Left;  . TEE WITHOUT CARDIOVERSION N/A 08/26/2018   Procedure: TRANSESOPHAGEAL ECHOCARDIOGRAM (TEE);  Surgeon: Prescott Gum, Collier Salina, MD;  Location: [place];  Service: Open Heart Surgery;  Laterality: N/A;  . VIDEO ASSISTED THORACOSCOPY Left 09/24/2018   Procedure: VIDEO ASSISTED THORACOSCOPY;  Surgeon: Prescott Gum, Collier Salina, MD;  Location: [facility] OR;  Service: Thoracic;  Laterality: Left;    Current Outpatient Medications  Medication Sig Dispense Refill  . albuterol (PROVENTIL  HFA;VENTOLIN HFA) 108 (90 Base) MCG/ACT inhaler Inhale 1-2 puffs into the lungs every 4 (four) hours as needed for wheezing or shortness of breath.    . allopurinol (ZYLOPRIM) 100 MG tablet Take 100 mg by mouth 2 (two) times daily.     Marland Kitchen aspirin EC 81 MG tablet Take 1 tablet (81 mg total) by mouth daily.    Marland Kitchen atorvastatin (LIPITOR) 40 MG tablet Take 1 tablet (40 mg total) by mouth daily. 90 tablet 3  . Fluticasone-Salmeterol (ADVAIR) 100-50 MCG/DOSE AEPB Inhale 1 puff into the lungs daily as needed (shortness of breath). 60 each   . metoprolol succinate (TOPROL-XL) 25 MG 24 hr tablet Take 1 tablet (25 mg total) by mouth daily. 30 tablet 11  . Multiple Vitamin (MULTIVITAMIN) tablet Take 1 tablet by mouth daily.    . sacubitril-valsartan (ENTRESTO) 24-26 MG Take 1 tablet by mouth 2 (two) times daily. 60 tablet 3  . Specialty Vitamins Products (PROSTATE PO) Take 1 tablet by mouth daily.    Marland Kitchen spironolactone (ALDACTONE) 25 MG tablet Take 0.5 tablets (12.5 mg total) by mouth daily. 30 tablet 6  . apixaban (ELIQUIS) 5 MG TABS tablet Take 1 tablet (5 mg total) by mouth 2 (two) times daily. 60 tablet 6   No current facility-administered medications for this encounter.     Allergies  Allergen Reactions  . Azithromycin Hives  . Erythromycin Hives    Social History   Socioeconomic History  . Marital status: Married    Spouse name: Not on file  . Number of children: Not on file  . Years of education: Not on file  . Highest education level: Not on file  Occupational History  . Not on file  Social Needs  . Financial resource strain: Not on file  . Food insecurity:    Worry: Not on file    Inability: Not on file  . Transportation needs:    Medical: Not on file    Non-medical: Not on file  Tobacco Use  . Smoking status: Never Smoker  . Smokeless tobacco: Never Used  Substance and Sexual Activity  . Alcohol use: Yes    Comment: occ.  . Drug use: No  . Sexual activity: Not on file    Lifestyle  . Physical activity:    Days per week: Not on file    Minutes per session: Not on file  . Stress: Not on file  Relationships  . Social connections:    Talks on phone: Not on file    Gets together: Not on file    Attends religious service: Not on file    Active member of club or organization: Not on file    Attends meetings of clubs or organizations: Not on file    Relationship status: Not on file  . Intimate partner violence:    Fear of current or ex partner: Not on file    Emotionally abused: Not on file    Physically abused: Not on file  Forced sexual activity: Not on file  Other Topics Concern  . Not on file  Social History Narrative  . Not on file    Family History  Problem Relation Age of Onset  . Stroke Mother   . Stroke Father   . Alcohol abuse Father   . Diabetes Brother   . Alcohol abuse Brother   . Stroke Maternal Grandfather     ROS- All systems are reviewed and negative except as per the HPI above  Physical Exam: Vitals:   12/10/18 1426  BP: 114/62  Pulse: 81  Weight: 98 kg  Height: 5' 9.5" (1.765 m)   Wt Readings from Last 3 Encounters:  12/10/18 98 kg  11/26/18 96.6 kg  11/21/18 98 kg    Labs: Lab Results  Component Value Date   NA 139 11/19/2018   K 4.9 11/19/2018   CL 100 11/19/2018   CO2 25 11/19/2018   GLUCOSE 83 11/19/2018   BUN 25 11/19/2018   CREATININE 1.41 (H) 11/19/2018   CALCIUM 9.6 11/19/2018   MG 2.5 (H) 08/28/2018   Lab Results  Component Value Date   INR 1.94 09/24/2018   Lab Results  Component Value Date   CHOL 116 06/26/2018   HDL 43 06/26/2018   LDLCALC 60 06/26/2018   TRIG 64 06/26/2018     GEN- The patient is well appearing, alert and oriented x 3 today.   Head- normocephalic, atraumatic Eyes-  Sclera clear, conjunctiva pink Ears- hearing intact Oropharynx- clear Neck- supple, no JVP Lymph- no cervical lymphadenopathy Lungs- Clear to ausculation bilaterally, normal work of  breathing Heart- Regular rate and rhythm, no murmurs, rubs or gallops, PMI not laterally displaced GI- soft, NT, ND, + BS Extremities- no clubbing, cyanosis, or edema MS- no significant deformity or atrophy Skin- no rash or lesion Psych- euthymic mood, full affect Neuro- strength and sensation are intact  EKG-v paced rhythm at 81 bpm Epic records reviewed Echo-Study Conclusions  - Left ventricle: The cavity size was at the upper limits of   normal. Wall thickness was increased in a pattern of mild LVH.   Systolic function was severely reduced. The estimated ejection   fraction was in the range of 20% to 25%. Diffuse hypokinesis.   Regional wall motion abnormalities cannot be excluded. Features   are consistent with a pseudonormal left ventricular filling   pattern, with concomitant abnormal relaxation and increased   filling pressure (grade 2 diastolic dysfunction). Doppler   parameters are consistent with high ventricular filling pressure. - Mitral valve: Calcified annulus. There was mild regurgitation. - Left atrium: The atrium was mildly dilated. - Right ventricle: The cavity size was normal. Wall thickness was   normal. Systolic function was mildly reduced. - Right atrium: The atrium was mildly dilated. - Pulmonary arteries: Systolic pressure was mildly to moderately   increased, in the range of 40 mm Hg to 45 mm Hg.    Assessment and Plan: 1.New onset afib on device  Pt. asymptomatic  Continue metoprolol 25 mg daily  2. CHA2DS2VASc score of at least 5  Will start eliquis 5 mg bid( creatinine usually runs 1.5 or higher but he is less than 80 and weight is 216 lbs)  Bleeding  precautions discussed I will continue ASA  81 mg daily as he just had bypass less than 4 months ago, will defer to Dr. Caryl Comes if this should be stopped  Since he is from [place], I will have him f/u with Dr. Caryl Comes there  in 3-4 weeks as it is inconvenient for him to come to [place].  Karmela Bram, [facility] [address] [place], [place] [address] [phone]

## 2018-12-12 ENCOUNTER — Other Ambulatory Visit (INDEPENDENT_AMBULATORY_CARE_PROVIDER_SITE_OTHER): Payer: PPO

## 2018-12-12 ENCOUNTER — Other Ambulatory Visit: Payer: Self-pay | Admitting: Internal Medicine

## 2018-12-12 DIAGNOSIS — I5022 Chronic systolic (congestive) heart failure: Secondary | ICD-10-CM | POA: Diagnosis not present

## 2018-12-12 DIAGNOSIS — Z79899 Other long term (current) drug therapy: Secondary | ICD-10-CM | POA: Diagnosis not present

## 2018-12-12 MED ORDER — SACUBITRIL-VALSARTAN 24-26 MG PO TABS: 1.0000 | ORAL_TABLET | Freq: Two times a day (BID) | ORAL | 3 refills | Status: DC

## 2018-12-12 NOTE — Telephone Encounter (Signed)
Scheduled 2/1 @1

## 2018-12-12 NOTE — Telephone Encounter (Signed)
°*  STAT* If patient is at the pharmacy, call can be transferred to refill team.   1. Which medications need to be refilled? (please list name of each medication and dose if known) Entresto 24-26 mg po BID   2. Which pharmacy/location (including street and city if local pharmacy) is medication to be sent to? [facility] [address] [place]   3. Do they need a 30 day or 90 day supply? 90   This is a new rx patient wants to cancel [facility]ill [facility] .  He is using entresto copay card . Please resend to new pharmacy .

## 2018-12-13 LAB — BASIC METABOLIC PANEL
BUN/Creatinine Ratio: 21 (ref 10–24)
BUN: 31 mg/dL — ABNORMAL HIGH (ref 8–27)
CO2: 19 mmol/L — ABNORMAL LOW (ref 20–29)
Calcium: 9.5 mg/dL (ref 8.6–10.2)
Chloride: 102 mmol/L (ref 96–106)
Creatinine, Ser: 1.49 mg/dL — ABNORMAL HIGH (ref 0.76–1.27)
GFR calc Af Amer: 52 mL/min/{1.73_m2} — ABNORMAL LOW (ref >59)
GFR calc non Af Amer: 45 mL/min/{1.73_m2} — ABNORMAL LOW (ref >59)
Glucose: 109 mg/dL — ABNORMAL HIGH (ref 65–99)
Potassium: 5 mmol/L (ref 3.5–5.2)
Sodium: 142 mmol/L (ref 134–144)

## 2018-12-14 LAB — CUP PACEART REMOTE DEVICE CHECK
Battery Remaining Longevity: 88 mo
Battery Voltage: 2.98 V
Brady Statistic AP VP Percent: 78.04 %
Brady Statistic AP VS Percent: 0 %
Brady Statistic AS VP Percent: 21.94 %
Brady Statistic AS VS Percent: 0 %
Brady Statistic RA Percent Paced: 73.33 %
Brady Statistic RV Percent Paced: 99.99 %
Date Time Interrogation Session: 20191211043927
Implantable Lead Implant Date: 20180723
Implantable Lead Implant Date: 20180723
Implantable Lead Location: 753859
Implantable Lead Location: 753860
Implantable Lead Model: 3830
Implantable Lead Model: 5076
Implantable Pulse Generator Implant Date: 20180723
Lead Channel Impedance Value: 285 Ohm
Lead Channel Impedance Value: 285 Ohm
Lead Channel Impedance Value: 361 Ohm
Lead Channel Impedance Value: 475 Ohm
Lead Channel Pacing Threshold Amplitude: 0.5 V
Lead Channel Pacing Threshold Amplitude: 1.5 V
Lead Channel Pacing Threshold Pulse Width: 0.4 ms
Lead Channel Pacing Threshold Pulse Width: 0.4 ms
Lead Channel Sensing Intrinsic Amplitude: 0.625 mV
Lead Channel Sensing Intrinsic Amplitude: 0.625 mV
Lead Channel Sensing Intrinsic Amplitude: 7.625 mV
Lead Channel Sensing Intrinsic Amplitude: 9.25 mV
Lead Channel Setting Pacing Amplitude: 2 V
Lead Channel Setting Pacing Amplitude: 2.5 V
Lead Channel Setting Pacing Pulse Width: 1 ms
Lead Channel Setting Sensing Sensitivity: 2.8 mV

## 2018-12-31 ENCOUNTER — Encounter: Payer: Self-pay | Admitting: Internal Medicine

## 2018-12-31 ENCOUNTER — Telehealth: Payer: Self-pay | Admitting: Internal Medicine

## 2018-12-31 ENCOUNTER — Ambulatory Visit (INDEPENDENT_AMBULATORY_CARE_PROVIDER_SITE_OTHER): Payer: PPO | Admitting: Internal Medicine

## 2018-12-31 VITALS — BP 118/66 | HR 89 | Ht 69.5 in | Wt 214.5 lb

## 2018-12-31 DIAGNOSIS — I442 Atrioventricular block, complete: Secondary | ICD-10-CM

## 2018-12-31 DIAGNOSIS — I493 Ventricular premature depolarization: Secondary | ICD-10-CM | POA: Diagnosis not present

## 2018-12-31 DIAGNOSIS — I480 Paroxysmal atrial fibrillation: Secondary | ICD-10-CM

## 2018-12-31 DIAGNOSIS — Z950 Presence of cardiac pacemaker: Secondary | ICD-10-CM

## 2018-12-31 DIAGNOSIS — I4892 Unspecified atrial flutter: Secondary | ICD-10-CM

## 2018-12-31 DIAGNOSIS — Z01812 Encounter for preprocedural laboratory examination: Secondary | ICD-10-CM

## 2018-12-31 NOTE — Patient Instructions (Signed)
Medication Instructions:  - Your physician has recommended you make the following change in your medication:   1) STOP aspirin  If you need a refill on your cardiac medications before your next appointment, please call your pharmacy.   Lab work: - none ordered today  If you have labs (blood work) drawn today and your tests are completely normal, you will receive your results only by: Marland Kitchen MyChart Message (if you have MyChart) OR . A paper copy in the mail If you have any lab test that is abnormal or we need to change your treatment, we will call you to review the results.  Testing/Procedures: - Your physician has requested that you have an echocardiogram late February/ Early March. Echocardiography is a painless test that uses sound waves to create images of your heart. It provides your doctor with information about the size and shape of your heart and how well your heart's chambers and valves are working. This procedure takes approximately one hour. There are no restrictions for this procedure.  - Your physician has recommended that you have a Bi-V ugrade of your device.  Nira Conn will call you to schedule)  Follow-Up: At [facility], you and your health needs are our priority.  As part of our continuing mission to provide you with exceptional heart care, we have created designated Provider Care Teams.  These Care Teams include your primary Cardiologist (physician) and Advanced Practice Providers (APPs -  Physician Assistants and Nurse Practitioners) who all work together to provide you with the care you need, when you need it. Marland Kitchen Pending procedure date  Any Other Special Instructions Will Be Listed Below (If Applicable). - N/A

## 2018-12-31 NOTE — Telephone Encounter (Signed)
Patient calling  States that he would like his procedure to be scheduled during the week of March 16th-20th if possible

## 2018-12-31 NOTE — Progress Notes (Signed)
Patient Care Team: [name], MD as PCP - General (Family Medicine)   HPI  Sean [name]. is a 78 y.o. male Seen in followup for His pacing 5852 complicated lead induced PVCs initially at >9 % .No  EF assessment prior to device implantation     DATE TEST EF   9/15 Myoview 55-65% No ischemia  10/18 Echo   50-55 %   5/19 Echo   30-35 %   7/19 CTA  Prob 3 V CAD  7/19 LHC  3V CAD  12/19 Echo  20-25%     Date Cr K Hgb  1/20 1.49 5.0 12.0            7/19 Cath >> 3V CAD Post cath he developed a left upper extremity DVT and was started on Eliquis >> this resulted in deferral of his plan for bypass.  Finally  then done early September with placing of a LV epicardial lead  Orthostatic intolerance requiring down titration of medications.  11/19  near syncopal episode Imaging demonstrated a large hemomothorax attributed per the family to a slow leak following his bypass surgery >>  transferred to [facility] and underwent VATS for drainage and decortication.  Interval Echo showed worsening LV function As above -- started on entresto   When I saw him 11/19 he was also complaining of pain along his tracked pacemaker lead.  I reached out to Dr. PVT who saw him and then felt that surgical revision was in order.  Accomplished 12/19 according to the patient, Dr. PVT would like [place] to delay for a couple of months prior to re-instrument in the pocket.  1/20 found to have persistent Aflutter > 72 hrs and referred to AFib clinic. ECG  Aflutter A -CL-250 msec , started on apixoban   Feeling mcuh better at this point  Exercising 60 min a day  No chest pain or edema or orthopnea   Past Medical History:  Diagnosis Date  . Arthritis   . Asthma   . CAD (coronary artery disease)    a. LHC 7/19: ostLM 30%, ostLAD 30%, p-mLAD 99% mod calcified, ostD1 90%, ost-pLCx 80% ulcerative & mod calcified, p-mLCx 50%, OM2 50%, mod dz LPDA, RCA small w/ diffuse dz throughout; b. recommendation of CABG;  c. 3-V CABG 08/26/18 (LIMA-LAD, VG-Diag, VG-LCx)  . Chronic systolic CHF (congestive heart failure) (HCC)    a. EF 35-40%, diffuse HK with HK of the apical, anteroseptal, and anterior myocardium, Gr1DD, mild MR, mildly dilated LA, pacer wire noted in the RV with nl RVSF, PASP nl  . CKD (chronic kidney disease), stage III ([place])   . Deep vein thrombosis (DVT) of upper extremity ([place])    a. DVT of left subclavian dx'd 06/17/2018; b. Eliquis  . GERD (gastroesophageal reflux disease)    takes otc occasionally  . History of complete heart block    a. s/p MDT PPM 05/2017  . Hypertension   . Obesity   . Presence of permanent cardiac pacemaker    Dr. Beckie Salts implanted 05/2017  . PVC's (premature ventricular contractions)    a. PPM-induced    Past Surgical History:  Procedure Laterality Date  . BACK SURGERY    . CORONARY ARTERY BYPASS GRAFT N/A 08/26/2018   Procedure: CORONARY ARTERY BYPASS GRAFTING (CABG) x3 , Using left internal mammory artery and right leg greater saphronious vein. Harvested endoscopically.;  Surgeon: Ivin Poot, MD;  Location: [place];  Service: Open Heart Surgery;  Laterality: N/A;  . EPICARDIAL PACING LEAD PLACEMENT N/A 08/26/2018   Procedure: LV PACING LEAD PLACEMENT;  Surgeon: Ivin Poot, MD;  Location: [place];  Service: Thoracic;  Laterality: N/A;  . EPICARDIAL PACING LEAD PLACEMENT Left 11/07/2018   Procedure: REVISE PACEMAKER LEAD LEFT CHEST;  Surgeon: Ivin Poot, MD;  Location: [place];  Service: Thoracic;  Laterality: Left;  . INSERT / REPLACE / REMOVE PACEMAKER     [employer]  . LEFT HEART CATH AND CORONARY ANGIOGRAPHY N/A 06/13/2018   Procedure: LEFT HEART CATH AND CORONARY ANGIOGRAPHY;  Surgeon: Wellington Hampshire, MD;  Location: [place] [facility];  Service: Cardiovascular;  Laterality: N/A;  . LEG SURGERY    . PACEMAKER IMPLANT N/A 06/11/2017   Procedure: Pacemaker Implant;  Surgeon: Evans Lance, MD;  Location: [place] [facility];  Service:  Cardiovascular;  Laterality: N/A;  . PLEURAL EFFUSION DRAINAGE Left 09/24/2018   Procedure: DRAINAGE OF LOCULATED PLEURAL EFFUSION;  Surgeon: Ivin Poot, MD;  Location: [place];  Service: Thoracic;  Laterality: Left;  . TEE WITHOUT CARDIOVERSION N/A 08/26/2018   Procedure: TRANSESOPHAGEAL ECHOCARDIOGRAM (TEE);  Surgeon: Prescott Gum, Collier Salina, MD;  Location: [place];  Service: Open Heart Surgery;  Laterality: N/A;  . VIDEO ASSISTED THORACOSCOPY Left 09/24/2018   Procedure: VIDEO ASSISTED THORACOSCOPY;  Surgeon: Prescott Gum, Collier Salina, MD;  Location: [facility] OR;  Service: Thoracic;  Laterality: Left;    Current Outpatient Medications  Medication Sig Dispense Refill  . albuterol (PROVENTIL HFA;VENTOLIN HFA) 108 (90 Base) MCG/ACT inhaler Inhale 1-2 puffs into the lungs every 4 (four) hours as needed for wheezing or shortness of breath.    . allopurinol (ZYLOPRIM) 100 MG tablet Take 100 mg by mouth 2 (two) times daily.     Marland Kitchen apixaban (ELIQUIS) 5 MG TABS tablet Take 1 tablet (5 mg total) by mouth 2 (two) times daily. 60 tablet 6  . aspirin EC 81 MG tablet Take 1 tablet (81 mg total) by mouth daily.    Marland Kitchen atorvastatin (LIPITOR) 40 MG tablet Take 1 tablet (40 mg total) by mouth daily. 90 tablet 3  . Fluticasone-Salmeterol (ADVAIR) 100-50 MCG/DOSE AEPB Inhale 1 puff into the lungs daily as needed (shortness of breath). 60 each   . metoprolol succinate (TOPROL-XL) 25 MG 24 hr tablet Take 1 tablet (25 mg total) by mouth daily. 30 tablet 11  . Multiple Vitamin (MULTIVITAMIN) tablet Take 1 tablet by mouth daily.    . sacubitril-valsartan (ENTRESTO) 24-26 MG Take 1 tablet by mouth 2 (two) times daily. 60 tablet 3  . Specialty Vitamins Products (PROSTATE PO) Take 1 tablet by mouth daily.    Marland Kitchen spironolactone (ALDACTONE) 25 MG tablet Take 0.5 tablets (12.5 mg total) by mouth daily. 30 tablet 6   No current facility-administered medications for this visit.     Allergies  Allergen Reactions  . Azithromycin Hives  .  Erythromycin Hives      Review of Systems negative except from HPI and PMH  Physical Exam BP 118/66 (BP Location: Left Arm, Patient Position: Sitting, Cuff Size: Normal)   Pulse 89   Ht 5' 9.5" (1.765 m)   Wt 214 lb 8 oz (97.3 kg)   BMI 31.22 kg/m  Well developed and well nourished in no acute distress HENT normal Neck supple with JVP-flat Clear Device pocket well healed; without hematoma or erythema.  There is no tethering  Regular rate and rhythm, no murmur Abd-soft with active BS No Clubbing cyanosis  edema Skin-warm  and dry A & Oriented  Grossly normal sensory and motor function      ECG  12/10/18 Personally reviewed  Aflutter Acl 250 with underlying Vpacing  ECG 12/31/2018 ventricular pacing with underlying atrial flutter  Rapid atrial pacing resulted in termination of atrial flutter and restoration of sinus rhythm.  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  Euvolemic.  With progressive cardiomyopathy and right ventricular apical pacing have discussed upgrade of his system using the previously implanted LV lead.  I cannot imagine why his LV function would have improved in parallel with his functional status, but prior to instrumenting him, we will undertake an echocardiogram  Will discontinue ASA at now

## 2018-12-31 NOTE — Telephone Encounter (Signed)
Patient saw Dr Caryl Comes today. Routing to [employer] for scheduling.

## 2019-01-07 ENCOUNTER — Other Ambulatory Visit: Payer: Self-pay | Admitting: Internal Medicine

## 2019-01-07 ENCOUNTER — Other Ambulatory Visit (HOSPITAL_COMMUNITY): Payer: Self-pay | Admitting: Nurse Practitioner

## 2019-01-07 NOTE — Telephone Encounter (Signed)
Please review for refill.  

## 2019-01-07 NOTE — Telephone Encounter (Signed)
°*  STAT* If patient is at the pharmacy, call can be transferred to refill team.   1. Which medications need to be refilled? (please list name of each medication and dose if known) Eliquis 5 mg po BID  2. Which pharmacy/location (including street and city if local pharmacy) is medication to be sent to? [facility] [address]   3. Do they need a 30 day or 90 day supply?  30  Patient states several times he wants to be sure he is to continue this rx. Please call if not

## 2019-01-07 NOTE — Telephone Encounter (Signed)
This was refilled by Afib clinic today.

## 2019-01-09 ENCOUNTER — Encounter: Payer: Self-pay | Admitting: *Deleted

## 2019-01-09 NOTE — Telephone Encounter (Signed)
The patient called back to the office. I have made him aware that his Bi-V upgrade is scheduled for Friday 02/07/19 at 9:30 am at [facility] (arrive 7:30 am). He is aware I will mail a detailed letter of instructions to him. He is also aware that when he comes to the office on 3/16 for his echo, that we will get his labs and he will need to pick up his surgical soap that day.  I have advised him if he does not get his letter in the mail, to please let us know the day he comes for his echo so we can print this off for him.   The patient voices understanding of the above and is agreeable.

## 2019-01-09 NOTE — Telephone Encounter (Signed)
I attempted to call the patient. Message received- no voice mail set up yet.  Bi-V PPM upgrade has been scheduled for Friday 02/07/19 at 9:30 am.  The patient has an echo scheduled for 02/03/19.   I will attempt to call the patient back at a later time to confirm the date/ time of his procedure.

## 2019-01-15 ENCOUNTER — Other Ambulatory Visit: Payer: Self-pay | Admitting: *Deleted

## 2019-01-15 ENCOUNTER — Encounter: Payer: Self-pay | Admitting: *Deleted

## 2019-01-15 DIAGNOSIS — Z01812 Encounter for preprocedural laboratory examination: Secondary | ICD-10-CM

## 2019-01-15 DIAGNOSIS — I480 Paroxysmal atrial fibrillation: Secondary | ICD-10-CM

## 2019-01-15 DIAGNOSIS — I5022 Chronic systolic (congestive) heart failure: Secondary | ICD-10-CM

## 2019-01-20 NOTE — Telephone Encounter (Signed)
Patient wife calling back to make sure 3/16 labs and echo are the only pre op appt patient needs prior to 3/20 procedure.  Please call to confirm.

## 2019-01-20 NOTE — Telephone Encounter (Signed)
Returned pt wife's call DPR on file.  Confirmed with Mrs.Eastham that the patient's pre-procedure testing is scheduled for 02/03/19 (echo,lab) at the [place] office. They have received the pre-procedure letter in the mail. Reminder her that the patient will need to pick up his surgical soap that day. Also confirmed the date, time and location of his procedure with Dr.Klein on 02/07/19. Mrs. Difabio verbalizes understanding and voiced appreciation for the call back.

## 2019-01-22 ENCOUNTER — Ambulatory Visit: Payer: Self-pay | Admitting: Cardiothoracic Surgery

## 2019-01-22 ENCOUNTER — Encounter: Payer: Self-pay | Admitting: Cardiothoracic Surgery

## 2019-01-22 VITALS — BP 123/65 | HR 82 | Ht 69.5 in | Wt 218.0 lb

## 2019-01-22 DIAGNOSIS — Z951 Presence of aortocoronary bypass graft: Secondary | ICD-10-CM

## 2019-01-22 DIAGNOSIS — Z09 Encounter for follow-up examination after completed treatment for conditions other than malignant neoplasm: Secondary | ICD-10-CM

## 2019-01-22 NOTE — Progress Notes (Signed)
PCP is [name], MD Referring Provider is Wellington Hampshire, MD  Chief Complaint  Patient presents with  . Routine Post Op    2 month f/u    HPI: Patient now doing well 3 months after multivessel CABG for ischemic cardiomyopathy and ejection fraction 25-30%.  At the time of surgery and LV lead was placed for potential later by V pacing.  Unfortunately the postop echo shows no significant improvement in LV ejection fraction.  The patient appears to be in good medical condition now for pacemaker revision to utilize the LV lead for his symptoms of class III CHF and low ejection fraction.  The patient is on Eliquis for atrial flutter. He feels actually very well except for some exertional fatigue.  He states he is able to do what he wants to do   Past Medical History:  Diagnosis Date  . Arthritis   . Asthma   . CAD (coronary artery disease)    a. LHC 7/19: ostLM 30%, ostLAD 30%, p-mLAD 99% mod calcified, ostD1 90%, ost-pLCx 80% ulcerative & mod calcified, p-mLCx 50%, OM2 50%, mod dz LPDA, RCA small w/ diffuse dz throughout; b. recommendation of CABG; c. 3-V CABG 08/26/18 (LIMA-LAD, VG-Diag, VG-LCx)  . Chronic systolic CHF (congestive heart failure) (HCC)    a. EF 35-40%, diffuse HK with HK of the apical, anteroseptal, and anterior myocardium, Gr1DD, mild MR, mildly dilated LA, pacer wire noted in the RV with nl RVSF, PASP nl  . CKD (chronic kidney disease), stage III ([place])   . Deep vein thrombosis (DVT) of upper extremity ([place])    a. DVT of left subclavian dx'd 06/17/2018; b. Eliquis  . GERD (gastroesophageal reflux disease)    takes otc occasionally  . History of complete heart block    a. s/p MDT PPM 05/2017  . Hypertension   . Obesity   . Presence of permanent cardiac pacemaker    Dr. Beckie Salts implanted 05/2017  . PVC's (premature ventricular contractions)    a. PPM-induced    Past Surgical History:  Procedure Laterality Date  . BACK SURGERY    . CORONARY ARTERY BYPASS GRAFT  N/A 08/26/2018   Procedure: CORONARY ARTERY BYPASS GRAFTING (CABG) x3 , Using left internal mammory artery and right leg greater saphronious vein. Harvested endoscopically.;  Surgeon: Ivin Poot, MD;  Location: [place];  Service: Open Heart Surgery;  Laterality: N/A;  . EPICARDIAL PACING LEAD PLACEMENT N/A 08/26/2018   Procedure: LV PACING LEAD PLACEMENT;  Surgeon: Ivin Poot, MD;  Location: [place];  Service: Thoracic;  Laterality: N/A;  . EPICARDIAL PACING LEAD PLACEMENT Left 11/07/2018   Procedure: REVISE PACEMAKER LEAD LEFT CHEST;  Surgeon: Ivin Poot, MD;  Location: [place];  Service: Thoracic;  Laterality: Left;  . INSERT / REPLACE / REMOVE PACEMAKER     [employer]  . LEFT HEART CATH AND CORONARY ANGIOGRAPHY N/A 06/13/2018   Procedure: LEFT HEART CATH AND CORONARY ANGIOGRAPHY;  Surgeon: Wellington Hampshire, MD;  Location: [place] [facility];  Service: Cardiovascular;  Laterality: N/A;  . LEG SURGERY    . PACEMAKER IMPLANT N/A 06/11/2017   Procedure: Pacemaker Implant;  Surgeon: Evans Lance, MD;  Location: [place] [facility];  Service: Cardiovascular;  Laterality: N/A;  . PLEURAL EFFUSION DRAINAGE Left 09/24/2018   Procedure: DRAINAGE OF LOCULATED PLEURAL EFFUSION;  Surgeon: Ivin Poot, MD;  Location: [place];  Service: Thoracic;  Laterality: Left;  . TEE WITHOUT CARDIOVERSION N/A 08/26/2018  Procedure: TRANSESOPHAGEAL ECHOCARDIOGRAM (TEE);  Surgeon: [place] Gum, Collier Salina, MD;  Location: [place];  Service: Open Heart Surgery;  Laterality: N/A;  . VIDEO ASSISTED THORACOSCOPY Left 09/24/2018   Procedure: VIDEO ASSISTED THORACOSCOPY;  Surgeon: Prescott Gum, Collier Salina, MD;  Location: [facility] OR;  Service: Thoracic;  Laterality: Left;    Family History  Problem Relation Age of Onset  . Stroke Mother   . Stroke Father   . Alcohol abuse Father   . Diabetes Brother   . Alcohol abuse Brother   . Stroke Maternal Grandfather     Social History Social History   Tobacco Use  . Smoking status:  Never Smoker  . Smokeless tobacco: Never Used  Substance Use Topics  . Alcohol use: Yes    Comment: occ.  . Drug use: No    Current Outpatient Medications  Medication Sig Dispense Refill  . albuterol (PROVENTIL HFA;VENTOLIN HFA) 108 (90 Base) MCG/ACT inhaler Inhale 1-2 puffs into the lungs every 4 (four) hours as needed for wheezing or shortness of breath.    . allopurinol (ZYLOPRIM) 100 MG tablet Take 100 mg by mouth 2 (two) times daily.     Marland Kitchen atorvastatin (LIPITOR) 40 MG tablet Take 1 tablet (40 mg total) by mouth daily. 90 tablet 3  . ELIQUIS 5 MG TABS tablet TAKE 1 TABLET BY MOUTH TWICE DAILY. 60 tablet 6  . Fluticasone-Salmeterol (ADVAIR) 100-50 MCG/DOSE AEPB Inhale 1 puff into the lungs daily as needed (shortness of breath). 60 each   . metoprolol succinate (TOPROL-XL) 25 MG 24 hr tablet Take 1 tablet (25 mg total) by mouth daily. 30 tablet 11  . Multiple Vitamin (MULTIVITAMIN) tablet Take 1 tablet by mouth daily.    . sacubitril-valsartan (ENTRESTO) 24-26 MG Take 1 tablet by mouth 2 (two) times daily. 60 tablet 3  . Specialty Vitamins Products (PROSTATE PO) Take 1 tablet by mouth daily.    Marland Kitchen spironolactone (ALDACTONE) 25 MG tablet Take 0.5 tablets (12.5 mg total) by mouth daily. 30 tablet 6   No current facility-administered medications for this visit.     Allergies  Allergen Reactions  . Azithromycin Hives  . Erythromycin Hives    Review of Systems  No fever No weight gain No bleeding complications from Eliquis No edema Surgical incisions all healed  BP 123/65   Pulse 82   Ht 5' 9.5" (1.765 m)   Wt 218 lb (98.9 kg)   SpO2 98% Comment: RA  BMI 31.73 kg/m  Physical Exam      Exam    General- alert and comfortable    Neck- no JVD, no cervical adenopathy palpable, no carotid bruit   Lungs- clear without rales, wheezes   Cor- regular rate and rhythm, no murmur , gallop   Abdomen- soft, non-tender   Extremities - warm, non-tender, minimal edema   Neuro-  oriented, appropriate, no focal weakness   Diagnostic Tests: None  Impression: Patient is recommended to proceed with pacemaker revision utilizing the LV lead for by V pacing due to his low EF, symptoms of class III CHF  Plan: Return for review of status in 2 to 3 months.  Len Childs, MD [facility] [phone]

## 2019-01-28 ENCOUNTER — Encounter: Payer: PPO | Admitting: Internal Medicine

## 2019-01-29 ENCOUNTER — Ambulatory Visit (INDEPENDENT_AMBULATORY_CARE_PROVIDER_SITE_OTHER): Payer: PPO | Admitting: *Deleted

## 2019-01-29 ENCOUNTER — Other Ambulatory Visit: Payer: Self-pay

## 2019-01-29 DIAGNOSIS — I442 Atrioventricular block, complete: Secondary | ICD-10-CM

## 2019-01-29 DIAGNOSIS — R001 Bradycardia, unspecified: Secondary | ICD-10-CM | POA: Diagnosis not present

## 2019-01-29 LAB — CUP PACEART INCLINIC DEVICE CHECK
Battery Remaining Longevity: 87 mo
Battery Voltage: 2.98 V
Brady Statistic AP VP Percent: 76.69 %
Brady Statistic AP VS Percent: 0 %
Brady Statistic AS VP Percent: 23.31 %
Brady Statistic AS VS Percent: 0.01 %
Brady Statistic RA Percent Paced: 76.33 %
Brady Statistic RV Percent Paced: 99.99 %
Date Time Interrogation Session: 20200107135209
Implantable Lead Implant Date: 20180723
Implantable Lead Implant Date: 20180723
Implantable Lead Location: 753859
Implantable Lead Location: 753860
Implantable Lead Model: 3830
Implantable Lead Model: 5076
Implantable Pulse Generator Implant Date: 20180723
Lead Channel Impedance Value: 285 Ohm
Lead Channel Impedance Value: 285 Ohm
Lead Channel Impedance Value: 361 Ohm
Lead Channel Impedance Value: 475 Ohm
Lead Channel Pacing Threshold Amplitude: 0.5 V
Lead Channel Pacing Threshold Amplitude: 0.75 V
Lead Channel Pacing Threshold Pulse Width: 0.4 ms
Lead Channel Pacing Threshold Pulse Width: 1 ms
Lead Channel Sensing Intrinsic Amplitude: 0.875 mV
Lead Channel Setting Pacing Amplitude: 2 V
Lead Channel Setting Pacing Amplitude: 2.5 V
Lead Channel Setting Pacing Pulse Width: 1 ms
Lead Channel Setting Sensing Sensitivity: 2.8 mV

## 2019-01-30 LAB — CUP PACEART REMOTE DEVICE CHECK
Battery Remaining Longevity: 86 mo
Battery Voltage: 2.99 V
Brady Statistic RA Percent Paced: 0.08 %
Brady Statistic RV Percent Paced: 99.99 %
Date Time Interrogation Session: 20200311143223
Implantable Lead Implant Date: 20180723
Implantable Lead Implant Date: 20180723
Implantable Lead Location: 753859
Implantable Lead Location: 753860
Implantable Lead Model: 3830
Implantable Lead Model: 5076
Implantable Pulse Generator Implant Date: 20180723
Lead Channel Impedance Value: 285 Ohm
Lead Channel Impedance Value: 304 Ohm
Lead Channel Impedance Value: 380 Ohm
Lead Channel Impedance Value: 494 Ohm
Lead Channel Pacing Threshold Amplitude: 0.5 V
Lead Channel Pacing Threshold Amplitude: 1.375 V
Lead Channel Pacing Threshold Pulse Width: 0.4 ms
Lead Channel Pacing Threshold Pulse Width: 0.4 ms
Lead Channel Sensing Intrinsic Amplitude: 1.375 mV
Lead Channel Sensing Intrinsic Amplitude: 1.375 mV
Lead Channel Sensing Intrinsic Amplitude: 7.625 mV
Lead Channel Sensing Intrinsic Amplitude: 9.25 mV
Lead Channel Setting Pacing Amplitude: 2 V
Lead Channel Setting Pacing Amplitude: 2.5 V
Lead Channel Setting Pacing Pulse Width: 1 ms
Lead Channel Setting Sensing Sensitivity: 2.8 mV

## 2019-01-31 ENCOUNTER — Telehealth: Payer: Self-pay

## 2019-01-31 NOTE — Telephone Encounter (Signed)
Pt was called and notified his procedure time has changed. He will need to report to [facility] at 10:30am on Friday 3/20 for his device upgrade. He has verbalized understanding.

## 2019-02-01 ENCOUNTER — Other Ambulatory Visit: Payer: Self-pay | Admitting: Internal Medicine

## 2019-02-01 DIAGNOSIS — I5022 Chronic systolic (congestive) heart failure: Secondary | ICD-10-CM

## 2019-02-03 ENCOUNTER — Telehealth: Payer: Self-pay | Admitting: Internal Medicine

## 2019-02-03 ENCOUNTER — Ambulatory Visit (INDEPENDENT_AMBULATORY_CARE_PROVIDER_SITE_OTHER): Payer: PPO

## 2019-02-03 ENCOUNTER — Other Ambulatory Visit (INDEPENDENT_AMBULATORY_CARE_PROVIDER_SITE_OTHER): Payer: PPO | Admitting: *Deleted

## 2019-02-03 ENCOUNTER — Other Ambulatory Visit: Payer: Self-pay

## 2019-02-03 DIAGNOSIS — Z01812 Encounter for preprocedural laboratory examination: Secondary | ICD-10-CM | POA: Diagnosis not present

## 2019-02-03 DIAGNOSIS — I480 Paroxysmal atrial fibrillation: Secondary | ICD-10-CM | POA: Diagnosis not present

## 2019-02-03 DIAGNOSIS — I5022 Chronic systolic (congestive) heart failure: Secondary | ICD-10-CM

## 2019-02-03 NOTE — Telephone Encounter (Signed)
Called to speak to patient  VM on phone is inactive   Left a VM on wife's phone to call re rescheudling

## 2019-02-03 NOTE — Telephone Encounter (Signed)
Called to speak with patient about upcoming procedure  Given concerns with COVID-19 and the risks related to hospital exposure and decreasing risks particularly in the elderly , we are postponing elective procedures.  Discussed with the patient and we have agreed to postpone the procedure.  We will plan to address rescheduling in about 4 weeks

## 2019-02-04 LAB — CBC WITH DIFFERENTIAL/PLATELET
Basophils Absolute: 0 10*3/uL (ref 0.0–0.2)
Basos: 0 %
EOS (ABSOLUTE): 0.4 10*3/uL (ref 0.0–0.4)
Eos: 6 %
Hematocrit: 38.7 % (ref 37.5–51.0)
Hemoglobin: 13.2 g/dL (ref 13.0–17.7)
Immature Grans (Abs): 0 10*3/uL (ref 0.0–0.1)
Immature Granulocytes: 0 %
Lymphocytes Absolute: 1.5 10*3/uL (ref 0.7–3.1)
Lymphs: 22 %
MCH: 31.3 pg (ref 26.6–33.0)
MCHC: 34.1 g/dL (ref 31.5–35.7)
MCV: 92 fL (ref 79–97)
Monocytes Absolute: 0.5 10*3/uL (ref 0.1–0.9)
Monocytes: 8 %
Neutrophils Absolute: 4.2 10*3/uL (ref 1.4–7.0)
Neutrophils: 64 %
Platelets: 202 10*3/uL (ref 150–450)
RBC: 4.22 x10E6/uL (ref 4.14–5.80)
RDW: 13.4 % (ref 11.6–15.4)
WBC: 6.7 10*3/uL (ref 3.4–10.8)

## 2019-02-04 LAB — BASIC METABOLIC PANEL
BUN/Creatinine Ratio: 22 (ref 10–24)
BUN: 34 mg/dL — ABNORMAL HIGH (ref 8–27)
CO2: 24 mmol/L (ref 20–29)
Calcium: 9 mg/dL (ref 8.6–10.2)
Chloride: 102 mmol/L (ref 96–106)
Creatinine, Ser: 1.55 mg/dL — ABNORMAL HIGH (ref 0.76–1.27)
GFR calc Af Amer: 49 mL/min/{1.73_m2} — ABNORMAL LOW (ref >59)
GFR calc non Af Amer: 43 mL/min/{1.73_m2} — ABNORMAL LOW (ref >59)
Glucose: 149 mg/dL — ABNORMAL HIGH (ref 65–99)
Potassium: 4.9 mmol/L (ref 3.5–5.2)
Sodium: 142 mmol/L (ref 134–144)

## 2019-02-04 NOTE — Telephone Encounter (Signed)
Noted- will reevaluate with MD and patient in ~ 4 weeks time.

## 2019-02-05 LAB — CUP PACEART INCLINIC DEVICE CHECK
Battery Remaining Longevity: 83 mo
Battery Voltage: 2.98 V
Brady Statistic AP VP Percent: 76.19 %
Brady Statistic AP VS Percent: 0 %
Brady Statistic AS VP Percent: 23.7 %
Brady Statistic AS VS Percent: 0 %
Brady Statistic RA Percent Paced: 8.33 %
Brady Statistic RV Percent Paced: 99.99 %
Date Time Interrogation Session: 20200211162819
Implantable Lead Implant Date: 20180723
Implantable Lead Implant Date: 20180723
Implantable Lead Location: 753859
Implantable Lead Location: 753860
Implantable Lead Model: 3830
Implantable Lead Model: 5076
Implantable Pulse Generator Implant Date: 20180723
Lead Channel Impedance Value: 285 Ohm
Lead Channel Impedance Value: 285 Ohm
Lead Channel Impedance Value: 399 Ohm
Lead Channel Impedance Value: 437 Ohm
Lead Channel Pacing Threshold Amplitude: 0.75 V
Lead Channel Pacing Threshold Pulse Width: 1 ms
Lead Channel Sensing Intrinsic Amplitude: 1.875 mV
Lead Channel Setting Pacing Amplitude: 2 V
Lead Channel Setting Pacing Amplitude: 2.5 V
Lead Channel Setting Pacing Pulse Width: 1 ms
Lead Channel Setting Sensing Sensitivity: 2.8 mV

## 2019-02-05 NOTE — Progress Notes (Signed)
Remote pacemaker transmission.   

## 2019-02-07 ENCOUNTER — Encounter (HOSPITAL_COMMUNITY): Payer: Self-pay

## 2019-02-07 ENCOUNTER — Ambulatory Visit (HOSPITAL_COMMUNITY): Admit: 2019-02-07 | Payer: PPO | Admitting: Internal Medicine

## 2019-02-07 SURGERY — BIV UPGRADE
Anesthesia: LOCAL

## 2019-02-26 NOTE — Telephone Encounter (Signed)
Dr. Caryl Comes- to follow up- I was to touch base with the patient next week about r/s his BiV PPM upgrade.  Since the COVID situation is no better at the present time, do you want [place] to schedule him to come back and see you in clinic in June to update his H&P and r/s procedure at that time?

## 2019-02-26 NOTE — Telephone Encounter (Signed)
Agree to postpone the visit

## 2019-02-27 NOTE — Telephone Encounter (Signed)
I spoke with the patient. He is agreeable with coming in for an office visit with Dr. Caryl Comes on 05/13/19 at 11:15 am. He is aware we will relook at scheduling his Bi-V upgrade at that time.

## 2019-02-27 NOTE — Telephone Encounter (Signed)
I attempted to call the patient at his home #- no answer & no voice mail set up.  I called his wife's cell #- (Ok per DPR to call this # and leave a detailed message)..  I asked that she or Sean [name] call back to schedule an in office visit with Dr. Caryl Comes in mid June.  I advised that with the current state of COVID-19 we are still unable to do elective procedures. I also advised we have to see the patient within 30 days of the procedure, therefore, we will need to shoot for mid June for an in office visit with hopes of upgrading the patient's device shortly thereafter.   I asked that they call back- ok just to speak with scheduling for an appointment unless they have any questions for me.

## 2019-04-08 ENCOUNTER — Telehealth: Payer: Self-pay

## 2019-04-08 NOTE — Telephone Encounter (Signed)
thnks

## 2019-04-08 NOTE — Telephone Encounter (Signed)
Called to offer pt to reschedule his device upgrade on June 1 and June 17th. Pt states he will be out of town for most of the month of June. He does not know when he will return. He states he will call us as soon as he is back in town to reschedule his procedure.   I advised him I would forward to Dr Caryl Comes and his RN in [place] so we are all aware of his situation and preference.

## 2019-04-08 NOTE — Telephone Encounter (Addendum)
I was already keeping an eye on him-   He was already on the schedule for 6/23 for an in office visit from when he was previously cancelled- this will be to update his H&P.

## 2019-04-10 NOTE — Telephone Encounter (Signed)
Noted  

## 2019-04-23 ENCOUNTER — Other Ambulatory Visit: Payer: Self-pay | Admitting: Cardiovascular Disease

## 2019-04-30 ENCOUNTER — Ambulatory Visit (INDEPENDENT_AMBULATORY_CARE_PROVIDER_SITE_OTHER): Payer: PPO | Admitting: *Deleted

## 2019-04-30 DIAGNOSIS — I442 Atrioventricular block, complete: Secondary | ICD-10-CM

## 2019-05-01 LAB — CUP PACEART REMOTE DEVICE CHECK
Battery Remaining Longevity: 83 mo
Battery Voltage: 2.98 V
Brady Statistic RA Percent Paced: 0.11 %
Brady Statistic RV Percent Paced: 99.99 %
Date Time Interrogation Session: 20200610195754
Implantable Lead Implant Date: 20180723
Implantable Lead Implant Date: 20180723
Implantable Lead Location: 753859
Implantable Lead Location: 753860
Implantable Lead Model: 3830
Implantable Lead Model: 5076
Implantable Pulse Generator Implant Date: 20180723
Lead Channel Impedance Value: 304 Ohm
Lead Channel Impedance Value: 323 Ohm
Lead Channel Impedance Value: 399 Ohm
Lead Channel Impedance Value: 494 Ohm
Lead Channel Pacing Threshold Amplitude: 0.5 V
Lead Channel Pacing Threshold Amplitude: 1.25 V
Lead Channel Pacing Threshold Pulse Width: 0.4 ms
Lead Channel Pacing Threshold Pulse Width: 0.4 ms
Lead Channel Sensing Intrinsic Amplitude: 1.5 mV
Lead Channel Sensing Intrinsic Amplitude: 1.5 mV
Lead Channel Sensing Intrinsic Amplitude: 7.625 mV
Lead Channel Sensing Intrinsic Amplitude: 9.25 mV
Lead Channel Setting Pacing Amplitude: 2 V
Lead Channel Setting Pacing Amplitude: 2.5 V
Lead Channel Setting Pacing Pulse Width: 1 ms
Lead Channel Setting Sensing Sensitivity: 2.8 mV

## 2019-05-08 ENCOUNTER — Encounter: Payer: Self-pay | Admitting: Cardiology

## 2019-05-08 NOTE — Progress Notes (Signed)
Remote pacemaker transmission.   

## 2019-05-09 ENCOUNTER — Telehealth: Payer: Self-pay

## 2019-05-09 NOTE — Telephone Encounter (Signed)
COVID-19 Pre-Screening Questions:   ?   In the past 7 to 10 days have you had a cough, shortness of breath, headache, congestion, fever (100 or greater) body aches, chills, sore throat, or sudden loss of taste or sense of smell?  N0  Have you been around anyone with known Covid 19. No  Have you been around anyone who is awaiting Covid 19 test results in the past 7 to 10 days? No  Have you been around anyone who has been exposed to Covid 19, or has mentioned symptoms of Covid 19 within the past 7 to 10 days? No  If you have any concerns/questions about symptoms patients report during screening (either on the phone or at threshold). Contact the provider seeing the patient or DOD for further guidance. If neither are available contact a member of the leadership team."

## 2019-05-13 ENCOUNTER — Encounter: Payer: PPO | Admitting: Internal Medicine

## 2019-06-10 ENCOUNTER — Encounter: Payer: PPO | Admitting: Internal Medicine

## 2019-06-11 ENCOUNTER — Telehealth: Payer: Self-pay | Admitting: Internal Medicine

## 2019-06-11 NOTE — Telephone Encounter (Signed)
Spoke with patient about Eliquis concerns. He states that it has become very expensive ($140/month) because he is now in the donut hole and he would like to discuss restarting warfarin instead. Patient is aware that this medication would have to be monitored by coming into our coumadin clinic to have his level drawn on a regular basis.  Patient states he missed appt on 06/10/2019 because he thought it was scheduled for tomorrow- 06/12/2019. He is very upset because he states our office has changed his appointment several times which was confusing to him and his wife. He would like to reschedule asap because his device needs to be upgraded and he was hoping to have this procedure done soon as he was originally supposed to have it done several months ago before elective procedures were canceled due to [place] .

## 2019-06-11 NOTE — Telephone Encounter (Signed)
Patient has an appointment with Caryl Comes 06/12/19 @8 :20. Patient is aware. COVID-19 Pre-Screening Questions:   ?   In the past 7 to 10 days have you had a cough, shortness of breath, headache, congestion, fever (100 or greater) body aches, chills, sore throat, or sudden loss of taste or sense of smell?  No  Have you been around anyone with known Covid 19. No  Have you been around anyone who is awaiting Covid 19 test results in the past 7 to 10 days? No   Have you been around anyone who has been exposed to Covid 19, or has mentioned symptoms of Covid 19 within the past 7 to 10 days? No   If you have any concerns/questions about symptoms patients report during screening (either on the phone or at threshold). Contact the provider seeing the patient or DOD for further guidance. If neither are available contact a member of the leadership team."

## 2019-06-11 NOTE — Telephone Encounter (Signed)
New Message   Pt c/o medication issue:  1. Name of Medication: ELIQUIS 5 MG TABS tablet   2. How are you currently taking this medication (dosage and times per day)?   3. Are you having a reaction (difficulty breathing--STAT)?   4. What is your medication issue? Patient is wanting to discuss changing this medication.

## 2019-06-12 ENCOUNTER — Ambulatory Visit (INDEPENDENT_AMBULATORY_CARE_PROVIDER_SITE_OTHER): Payer: PPO | Admitting: Internal Medicine

## 2019-06-12 ENCOUNTER — Other Ambulatory Visit: Payer: Self-pay

## 2019-06-12 ENCOUNTER — Encounter: Payer: Self-pay | Admitting: Internal Medicine

## 2019-06-12 VITALS — BP 148/80 | HR 78 | Ht 69.5 in | Wt 216.0 lb

## 2019-06-12 DIAGNOSIS — I442 Atrioventricular block, complete: Secondary | ICD-10-CM

## 2019-06-12 DIAGNOSIS — I4892 Unspecified atrial flutter: Secondary | ICD-10-CM | POA: Insufficient documentation

## 2019-06-12 DIAGNOSIS — I255 Ischemic cardiomyopathy: Secondary | ICD-10-CM

## 2019-06-12 DIAGNOSIS — Z950 Presence of cardiac pacemaker: Secondary | ICD-10-CM | POA: Insufficient documentation

## 2019-06-12 DIAGNOSIS — I493 Ventricular premature depolarization: Secondary | ICD-10-CM | POA: Insufficient documentation

## 2019-06-12 MED ORDER — LOSARTAN POTASSIUM 50 MG PO TABS: 50.0000 mg | ORAL_TABLET | Freq: Every day | ORAL | 3 refills | Status: DC

## 2019-06-12 NOTE — Patient Instructions (Addendum)
Medication Instructions:  - Your physician has recommended you make the following change in your medication:   1) Stop entresto  2) Start cozaar (losartan) 50 mg- take 1 tablet by mouth once daily  3) Continue eliquis until you hear from our Coumadin Clinic nurse, [facility]- she will help transition you off of eliquis and on to coumadin (warfarin)  Eliquis 5 mg samples given today: Lot: [id number] Exp: 9/22 # 2 boxes  If you need a refill on your cardiac medications before your next appointment, please call your pharmacy.   Lab work: - none ordered  If you have labs (blood work) drawn today and your tests are completely normal, you will receive your results only by: Marland Kitchen MyChart Message (if you have MyChart) OR . A paper copy in the mail If you have any lab test that is abnormal or we need to change your treatment, we will call you to review the results.  Testing/Procedures: -  Your physician has requested that you have an echocardiogram in 3-4 months (prior to you follow up with Dr. Caryl Comes. Echocardiography is a painless test that uses sound waves to create images of your heart. It provides your doctor with information about the size and shape of your heart and how well your heart's chambers and valves are working. This procedure takes approximately one hour. There are no restrictions for this procedure.  Follow-Up: At [facility], you and your health needs are our priority.  As part of our continuing mission to provide you with exceptional heart care, we have created designated Provider Care Teams.  These Care Teams include your primary Cardiologist (physician) and Advanced Practice Providers (APPs -  Physician Assistants and Nurse Practitioners) who all work together to provide you with the care you need, when you need it.  . You will need a follow up appointment in 4 months with Dr. Caryl Comes (November) . Please call our office 2 months in advance to schedule this appointment.  (call in early  September to schedule)  Any Other Special Instructions Will Be Listed Below (If Applicable). - N/A

## 2019-06-12 NOTE — Progress Notes (Signed)
Patient Care Team: [name], MD as PCP - General (Family Medicine)   HPI  Sean [name]. is a 78 y.o. male Seen in followup for His pacing 7169 complicated lead induced PVCs initially at >9 % .No  EF assessment prior to device implantation     DATE TEST EF   9/15 Myoview 55-65% No ischemia  10/18 Echo   50-55 %   5/19 Echo   30-35 %   7/19 CTA  Prob 3 V CAD  7/19 LHC  3V CAD  12/19 Echo  20-25%   3/20 Echo  25-30%      Date Cr K Hgb  1/20 1.49 5.0 12.0            7/19 Cath >> 3V CAD Post cath he developed a left upper extremity DVT and was started on Eliquis >> this resulted in deferral of his plan for bypass.  Finally  then done early September with placing of a LV epicardial lead  Orthostatic intolerance requiring down titration of medications.  11/19  near syncopal episode Imaging demonstrated a large hemomothorax attributed per the family to a slow leak following his bypass surgery >>  transferred to [facility] and underwent VATS for drainage and decortication.  worsening LV function As above -- started on entresto now needs to come off because of cost.  When I saw him 11/19 he was also complaining of pain along his tracked pacemaker lead.  I reached out to Dr. PVT who saw him and then felt that surgical revision was in order.  Accomplished 12/19 according to the patient, Dr. PVT would like [place] to delay for a couple of months prior to re-instrument in the pocket.  1/20 found to have persistent Aflutter > 72 hrs and referred to AFib clinic. ECG  Aflutter A -CL-250 msec , started on apixoban because of cost, he would like to be transitioned to warfarin no bleeding  Denies chest pain shortness of breath fatigue or edema.  Past Medical History:  Diagnosis Date  . Arthritis   . Asthma   . CAD (coronary artery disease)    a. LHC 7/19: ostLM 30%, ostLAD 30%, p-mLAD 99% mod calcified, ostD1 90%, ost-pLCx 80% ulcerative & mod calcified, p-mLCx 50%, OM2 50%, mod dz  LPDA, RCA small w/ diffuse dz throughout; b. recommendation of CABG; c. 3-V CABG 08/26/18 (LIMA-LAD, VG-Diag, VG-LCx)  . Chronic systolic CHF (congestive heart failure) (HCC)    a. EF 35-40%, diffuse HK with HK of the apical, anteroseptal, and anterior myocardium, Gr1DD, mild MR, mildly dilated LA, pacer wire noted in the RV with nl RVSF, PASP nl  . CKD (chronic kidney disease), stage III ([place])   . Deep vein thrombosis (DVT) of upper extremity ([place])    a. DVT of left subclavian dx'd 06/17/2018; b. Eliquis  . GERD (gastroesophageal reflux disease)    takes otc occasionally  . History of complete heart block    a. s/p MDT PPM 05/2017  . Hypertension   . Obesity   . Presence of permanent cardiac pacemaker    Dr. Beckie Salts implanted 05/2017  . PVC's (premature ventricular contractions)    a. PPM-induced    Past Surgical History:  Procedure Laterality Date  . BACK SURGERY    . CORONARY ARTERY BYPASS GRAFT N/A 08/26/2018   Procedure: CORONARY ARTERY BYPASS GRAFTING (CABG) x3 , Using left internal mammory artery and right leg greater saphronious vein. Harvested endoscopically.;  Surgeon: Prescott Gum,  Collier Salina, MD;  Location: [place];  Service: Open Heart Surgery;  Laterality: N/A;  . EPICARDIAL PACING LEAD PLACEMENT N/A 08/26/2018   Procedure: LV PACING LEAD PLACEMENT;  Surgeon: Ivin Poot, MD;  Location: [place];  Service: Thoracic;  Laterality: N/A;  . EPICARDIAL PACING LEAD PLACEMENT Left 11/07/2018   Procedure: REVISE PACEMAKER LEAD LEFT CHEST;  Surgeon: Ivin Poot, MD;  Location: [place];  Service: Thoracic;  Laterality: Left;  . INSERT / REPLACE / REMOVE PACEMAKER     [employer]TRONIC  . LEFT HEART CATH AND CORONARY ANGIOGRAPHY N/A 06/13/2018   Procedure: LEFT HEART CATH AND CORONARY ANGIOGRAPHY;  Surgeon: Wellington Hampshire, MD;  Location: [place] CV LAB;  Service: Cardiovascular;  Laterality: N/A;  . LEG SURGERY    . PACEMAKER IMPLANT N/A 06/11/2017   Procedure: Pacemaker Implant;  Surgeon:  Evans Lance, MD;  Location: [place] CV LAB;  Service: Cardiovascular;  Laterality: N/A;  . PLEURAL EFFUSION DRAINAGE Left 09/24/2018   Procedure: DRAINAGE OF LOCULATED PLEURAL EFFUSION;  Surgeon: Ivin Poot, MD;  Location: [place];  Service: Thoracic;  Laterality: Left;  . TEE WITHOUT CARDIOVERSION N/A 08/26/2018   Procedure: TRANSESOPHAGEAL ECHOCARDIOGRAM (TEE);  Surgeon: Prescott Gum, Collier Salina, MD;  Location: [place];  Service: Open Heart Surgery;  Laterality: N/A;  . VIDEO ASSISTED THORACOSCOPY Left 09/24/2018   Procedure: VIDEO ASSISTED THORACOSCOPY;  Surgeon: Prescott Gum, Collier Salina, MD;  Location: [facility] OR;  Service: Thoracic;  Laterality: Left;    Current Outpatient Medications  Medication Sig Dispense Refill  . acetaminophen (TYLENOL) 500 MG tablet Take 500 mg by mouth every 6 (six) hours as needed for moderate pain or headache.    . albuterol (PROVENTIL HFA;VENTOLIN HFA) 108 (90 Base) MCG/ACT inhaler Inhale 2 puffs into the lungs every 6 (six) hours as needed for wheezing or shortness of breath.     . allopurinol (ZYLOPRIM) 100 MG tablet Take 100 mg by mouth 2 (two) times daily.     . Coenzyme Q10 (COQ-10) 100 MG CAPS Take 100 mg by mouth daily.    Marland Kitchen ELIQUIS 5 MG TABS tablet TAKE 1 TABLET BY MOUTH TWICE DAILY. (Patient taking differently: Take 5 mg by mouth 2 (two) times daily. ) 60 tablet 6  . ENTRESTO 24-26 MG TAKE 1 TABLET BY MOUTH TWICE DAILY 60 tablet 2  . Fluticasone-Salmeterol (ADVAIR) 100-50 MCG/DOSE AEPB Inhale 1 puff into the lungs daily as needed (shortness of breath). 60 each   . metoprolol succinate (TOPROL-XL) 25 MG 24 hr tablet Take 1 tablet (25 mg total) by mouth daily. 30 tablet 11  . Multiple Vitamin (MULTIVITAMIN) tablet Take 1 tablet by mouth daily.    Marland Kitchen Specialty Vitamins Products (PROSTATE PO) Take 1 tablet by mouth 2 (two) times daily.     Marland Kitchen atorvastatin (LIPITOR) 40 MG tablet Take 1 tablet (40 mg total) by mouth daily. (Patient taking differently: Take 40 mg by mouth at  bedtime. ) 90 tablet 3  . spironolactone (ALDACTONE) 25 MG tablet Take 0.5 tablets (12.5 mg total) by mouth daily. 30 tablet 6   No current facility-administered medications for this visit.     Allergies  Allergen Reactions  . Azithromycin Hives  . Erythromycin Hives      Review of Systems negative except from HPI and PMH  Physical Exam BP (!) 148/80 (BP Location: Left Arm, Patient Position: Sitting, Cuff Size: Normal)   Pulse 78   Ht 5' 9.5" (1.765 m)   Wt 216 lb (  98 kg)   SpO2 98%   BMI 31.44 kg/m  Well developed and well nourished in no acute distress HENT normal Neck supple with JVP-flat Clear Device pocket well healed; without hematoma or erythema.  There is no tethering  Regular rate and rhythm, no gallop No  murmur Abd-soft with active BS No Clubbing cyanosis  edema Skin-warm and dry A & Oriented  Grossly normal sensory and motor function     ECG Atrial flutter underlying V pacing  ECG  12/10/18 Personally reviewed  Aflutter Acl 250 with underlying Vpacing  ECG 12/31/2018 ventricular pacing with underlying atrial flutter  Rapid atrial pacing resulted in termination of atrial flutter and restoration of sinus rhythm.  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  His big issue has been cost of medications and so we have transitioned him Entresto--losartan, Eliquis--warfarin.  As such, my inclination was to do less, specifically not invading his pocket to hookup his LV lead. After he left, however, I am having second thoughts.  To this end, I will discuss with him about a different approach for his arrhythmia that is to say undertake catheter ablation of his atrial flutter.  Referring hookup  of his LV lead might still be appropriate, allowing even a small change in symptoms to dictate the timing.  We spent more than 50% of our >25 min visit in face to face counseling regarding the above

## 2019-06-17 ENCOUNTER — Telehealth: Payer: Self-pay | Admitting: *Deleted

## 2019-06-17 MED ORDER — WARFARIN SODIUM 5 MG PO TABS: ORAL_TABLET | ORAL | 3 refills | Status: DC

## 2019-06-17 NOTE — Telephone Encounter (Signed)
Scheduled

## 2019-06-17 NOTE — Telephone Encounter (Signed)
I spoke with the patient. He is aware that I will send in warfarin 5 mg once daily for him.  He will not be able to pick up warfarin until tomorrow. He will take eliquis tonight and in the AM as scheduled. He will take his first dose of warfarin tomorrow evening. He is aware he will need to be seen in the coumadin clinic in about 5 days. He is aware he will be called to schedule this.   The patient voices understanding of the above and is agreeable.

## 2019-06-17 NOTE — Telephone Encounter (Signed)
-----   Message from Stana Bunting, RN sent at 06/16/2019  7:03 AM EDT ----- Hey lady!  It doesn't look like he was actually started on warfarin.  I can only make the suggestion, but can't make the change.  They typically start out on 5 mg daily and then have me check the INR in 3-5 days.  Eliquis is easy to transition - just stop the Eliquis and start warfarin when the next dosage is due, unless he wants to do a "bridge" to keep him protected while the warfarin builds up in his system.  Let me know what he wants to do and I'll get him on my schedule for next week, depending on when he starts the warfarin.  Thank you!    ----- Message ----- From: Emily Filbert, RN Sent: 06/12/2019   9:32 AM EDT To: Stana Bunting, RN  Hello friend!  Do you mind reaching out to this patient as his eliquis is too expensive for him now (donut hole) and he is asking to go on wafarin.  Dr. Caryl Comes saw him today and agreed to the switch. He has enough eliquis to last about 2 weeks.  I just told him to stay on it until he heard from you.  Thanks!

## 2019-06-17 NOTE — Telephone Encounter (Signed)
Dr. Caryl Comes- start warfarin 5 mg once daily?

## 2019-06-17 NOTE — Telephone Encounter (Signed)
Begin warfarin 5 mg to replace apixoban

## 2019-06-23 ENCOUNTER — Other Ambulatory Visit: Payer: Self-pay

## 2019-06-23 ENCOUNTER — Telehealth: Payer: Self-pay | Admitting: Internal Medicine

## 2019-06-23 ENCOUNTER — Ambulatory Visit (INDEPENDENT_AMBULATORY_CARE_PROVIDER_SITE_OTHER): Payer: PPO

## 2019-06-23 DIAGNOSIS — Z5181 Encounter for therapeutic drug level monitoring: Secondary | ICD-10-CM | POA: Diagnosis not present

## 2019-06-23 DIAGNOSIS — I4892 Unspecified atrial flutter: Secondary | ICD-10-CM

## 2019-06-23 LAB — POCT INR: INR: 1.8 — AB (ref 2.0–3.0)

## 2019-06-23 NOTE — Telephone Encounter (Signed)
Please call to discuss Eliqus and Entresto, patient states these were switched with different medications and has some questions.

## 2019-06-23 NOTE — Patient Instructions (Signed)
Please stay on current dosage of 1 tablet of warfarin every day.  Recheck in 1 week.

## 2019-06-23 NOTE — Telephone Encounter (Signed)
Pt had INR check today - all questions answered during his visit.

## 2019-06-30 ENCOUNTER — Other Ambulatory Visit: Payer: Self-pay

## 2019-06-30 ENCOUNTER — Ambulatory Visit (INDEPENDENT_AMBULATORY_CARE_PROVIDER_SITE_OTHER): Payer: PPO

## 2019-06-30 DIAGNOSIS — Z5181 Encounter for therapeutic drug level monitoring: Secondary | ICD-10-CM

## 2019-06-30 DIAGNOSIS — I4892 Unspecified atrial flutter: Secondary | ICD-10-CM | POA: Diagnosis not present

## 2019-06-30 LAB — POCT INR: INR: 3.4 — AB (ref 2.0–3.0)

## 2019-06-30 NOTE — Patient Instructions (Signed)
Please skip warfarin tonight, then START NEW DOSAGE of 1 tablet of warfarin every day EXCEPT 1/2 TABLET on [place]. Recheck in 2 weeks.

## 2019-07-08 ENCOUNTER — Telehealth: Payer: Self-pay | Admitting: Cardiovascular Disease

## 2019-07-08 ENCOUNTER — Other Ambulatory Visit: Payer: Self-pay | Admitting: Cardiovascular Disease

## 2019-07-08 ENCOUNTER — Other Ambulatory Visit: Payer: Self-pay | Admitting: Internal Medicine

## 2019-07-08 NOTE — Telephone Encounter (Signed)
Please review for refill.  

## 2019-07-08 NOTE — Telephone Encounter (Signed)
New Message    *STAT* If patient is at the pharmacy, call can be transferred to refill team.   1. Which medications need to be refilled? (please list name of each medication and dose if known) atorvastatin (LIPITOR) 40 MG tablet   2. Which pharmacy/location (including street and city if local pharmacy) is medication to be sent to? [facility] [place], [address] AT [facility] [address]  3. Do they need a 30 day or 90 day supply? [place]

## 2019-07-08 NOTE — Telephone Encounter (Signed)
Ok to refill. Sent. 

## 2019-07-11 NOTE — Telephone Encounter (Signed)
Refilled on 07/09/2019 to [place]. Spoke with patient and he states he picked this Rx up at [facility].

## 2019-07-14 ENCOUNTER — Ambulatory Visit (INDEPENDENT_AMBULATORY_CARE_PROVIDER_SITE_OTHER): Payer: PPO

## 2019-07-14 ENCOUNTER — Other Ambulatory Visit: Payer: Self-pay

## 2019-07-14 DIAGNOSIS — Z5181 Encounter for therapeutic drug level monitoring: Secondary | ICD-10-CM

## 2019-07-14 DIAGNOSIS — I4892 Unspecified atrial flutter: Secondary | ICD-10-CM

## 2019-07-14 LAB — POCT INR: INR: 2.8 (ref 2.0–3.0)

## 2019-07-14 NOTE — Patient Instructions (Signed)
Please continue dosage of 1 tablet of warfarin every day EXCEPT 1/2 TABLET on [place]. Recheck in 3 weeks.

## 2019-07-30 ENCOUNTER — Other Ambulatory Visit: Payer: Self-pay

## 2019-07-30 ENCOUNTER — Ambulatory Visit (INDEPENDENT_AMBULATORY_CARE_PROVIDER_SITE_OTHER): Payer: PPO | Admitting: *Deleted

## 2019-07-30 ENCOUNTER — Ambulatory Visit (INDEPENDENT_AMBULATORY_CARE_PROVIDER_SITE_OTHER): Payer: PPO

## 2019-07-30 DIAGNOSIS — I4892 Unspecified atrial flutter: Secondary | ICD-10-CM | POA: Diagnosis not present

## 2019-07-30 DIAGNOSIS — Z5181 Encounter for therapeutic drug level monitoring: Secondary | ICD-10-CM

## 2019-07-30 DIAGNOSIS — I255 Ischemic cardiomyopathy: Secondary | ICD-10-CM

## 2019-07-30 DIAGNOSIS — I442 Atrioventricular block, complete: Secondary | ICD-10-CM

## 2019-07-30 LAB — CUP PACEART REMOTE DEVICE CHECK
Battery Remaining Longevity: 82 mo
Battery Voltage: 2.98 V
Brady Statistic AP VP Percent: 97.17 %
Brady Statistic AP VS Percent: 0 %
Brady Statistic AS VP Percent: 11.73 %
Brady Statistic AS VS Percent: 0 %
Brady Statistic RA Percent Paced: 0.12 %
Brady Statistic RV Percent Paced: 99.99 %
Date Time Interrogation Session: 20200909154307
Implantable Lead Implant Date: 20180723
Implantable Lead Implant Date: 20180723
Implantable Lead Location: 753859
Implantable Lead Location: 753860
Implantable Lead Model: 3830
Implantable Lead Model: 5076
Implantable Pulse Generator Implant Date: 20180723
Lead Channel Impedance Value: 285 Ohm
Lead Channel Impedance Value: 323 Ohm
Lead Channel Impedance Value: 380 Ohm
Lead Channel Impedance Value: 513 Ohm
Lead Channel Pacing Threshold Amplitude: 0.5 V
Lead Channel Pacing Threshold Amplitude: 1.125 V
Lead Channel Pacing Threshold Pulse Width: 0.4 ms
Lead Channel Pacing Threshold Pulse Width: 0.4 ms
Lead Channel Sensing Intrinsic Amplitude: 1.5 mV
Lead Channel Sensing Intrinsic Amplitude: 1.5 mV
Lead Channel Sensing Intrinsic Amplitude: 7.625 mV
Lead Channel Sensing Intrinsic Amplitude: 9.25 mV
Lead Channel Setting Pacing Amplitude: 2 V
Lead Channel Setting Pacing Amplitude: 2.5 V
Lead Channel Setting Pacing Pulse Width: 1 ms
Lead Channel Setting Sensing Sensitivity: 2.8 mV

## 2019-07-30 LAB — POCT INR: INR: 2.3 (ref 2.0–3.0)

## 2019-07-30 NOTE — Patient Instructions (Signed)
Please continue dosage of 1 tablet of warfarin every day EXCEPT 1/2 TABLET on [place]. Recheck in 4 weeks.

## 2019-08-14 NOTE — Progress Notes (Signed)
Remote pacemaker transmission.   

## 2019-08-25 ENCOUNTER — Ambulatory Visit (INDEPENDENT_AMBULATORY_CARE_PROVIDER_SITE_OTHER): Payer: PPO

## 2019-08-25 ENCOUNTER — Other Ambulatory Visit: Payer: Self-pay

## 2019-08-25 DIAGNOSIS — Z5181 Encounter for therapeutic drug level monitoring: Secondary | ICD-10-CM

## 2019-08-25 DIAGNOSIS — I4892 Unspecified atrial flutter: Secondary | ICD-10-CM | POA: Diagnosis not present

## 2019-08-25 LAB — POCT INR: INR: 2.7 (ref 2.0–3.0)

## 2019-08-25 NOTE — Patient Instructions (Signed)
Please continue dosage of 1 tablet of warfarin every day EXCEPT 1/2 TABLET on [place]. Recheck in 5 weeks.

## 2019-09-08 DIAGNOSIS — I10 Essential (primary) hypertension: Secondary | ICD-10-CM | POA: Diagnosis not present

## 2019-09-08 DIAGNOSIS — E785 Hyperlipidemia, unspecified: Secondary | ICD-10-CM | POA: Diagnosis not present

## 2019-09-08 DIAGNOSIS — Z0000 Encounter for general adult medical examination without abnormal findings: Secondary | ICD-10-CM | POA: Diagnosis not present

## 2019-09-08 DIAGNOSIS — Z23 Encounter for immunization: Secondary | ICD-10-CM | POA: Diagnosis not present

## 2019-09-08 DIAGNOSIS — R351 Nocturia: Secondary | ICD-10-CM | POA: Diagnosis not present

## 2019-09-08 DIAGNOSIS — I25119 Atherosclerotic heart disease of native coronary artery with unspecified angina pectoris: Secondary | ICD-10-CM | POA: Diagnosis not present

## 2019-09-08 DIAGNOSIS — M1A9XX0 Chronic gout, unspecified, without tophus (tophi): Secondary | ICD-10-CM | POA: Diagnosis not present

## 2019-09-11 DIAGNOSIS — E785 Hyperlipidemia, unspecified: Secondary | ICD-10-CM | POA: Diagnosis not present

## 2019-09-11 DIAGNOSIS — I10 Essential (primary) hypertension: Secondary | ICD-10-CM | POA: Diagnosis not present

## 2019-09-29 ENCOUNTER — Ambulatory Visit (INDEPENDENT_AMBULATORY_CARE_PROVIDER_SITE_OTHER): Payer: PPO

## 2019-09-29 ENCOUNTER — Other Ambulatory Visit: Payer: Self-pay

## 2019-09-29 DIAGNOSIS — I4892 Unspecified atrial flutter: Secondary | ICD-10-CM | POA: Diagnosis not present

## 2019-09-29 DIAGNOSIS — Z5181 Encounter for therapeutic drug level monitoring: Secondary | ICD-10-CM

## 2019-09-29 LAB — POCT INR: INR: 1.8 — AB (ref 2.0–3.0)

## 2019-09-29 NOTE — Patient Instructions (Signed)
Please take 1 whole tablet tonight, then continue dosage of 1 tablet of warfarin every day EXCEPT 1/2 TABLET on [place]. Recheck in 5 weeks.

## 2019-10-08 ENCOUNTER — Other Ambulatory Visit: Payer: Self-pay | Admitting: Cardiovascular Disease

## 2019-10-14 ENCOUNTER — Ambulatory Visit (INDEPENDENT_AMBULATORY_CARE_PROVIDER_SITE_OTHER): Payer: PPO

## 2019-10-14 ENCOUNTER — Other Ambulatory Visit: Payer: Self-pay

## 2019-10-14 DIAGNOSIS — Z950 Presence of cardiac pacemaker: Secondary | ICD-10-CM | POA: Diagnosis not present

## 2019-10-14 DIAGNOSIS — I255 Ischemic cardiomyopathy: Secondary | ICD-10-CM | POA: Diagnosis not present

## 2019-10-21 ENCOUNTER — Ambulatory Visit (INDEPENDENT_AMBULATORY_CARE_PROVIDER_SITE_OTHER): Payer: PPO | Admitting: Internal Medicine

## 2019-10-21 ENCOUNTER — Other Ambulatory Visit: Payer: Self-pay

## 2019-10-21 ENCOUNTER — Encounter: Payer: Self-pay | Admitting: Internal Medicine

## 2019-10-21 ENCOUNTER — Other Ambulatory Visit
Admission: RE | Admit: 2019-10-21 | Discharge: 2019-10-21 | Disposition: A | Discharge status: Home / Self Care | Payer: PPO | Attending: Internal Medicine | Admitting: Internal Medicine

## 2019-10-21 VITALS — BP 132/64 | HR 71 | Ht 69.5 in | Wt 224.5 lb

## 2019-10-21 DIAGNOSIS — I442 Atrioventricular block, complete: Secondary | ICD-10-CM

## 2019-10-21 DIAGNOSIS — I4892 Unspecified atrial flutter: Secondary | ICD-10-CM

## 2019-10-21 DIAGNOSIS — I493 Ventricular premature depolarization: Secondary | ICD-10-CM | POA: Diagnosis not present

## 2019-10-21 DIAGNOSIS — Z950 Presence of cardiac pacemaker: Secondary | ICD-10-CM

## 2019-10-21 DIAGNOSIS — Z79899 Other long term (current) drug therapy: Secondary | ICD-10-CM | POA: Diagnosis not present

## 2019-10-21 DIAGNOSIS — I255 Ischemic cardiomyopathy: Secondary | ICD-10-CM | POA: Diagnosis not present

## 2019-10-21 LAB — CBC WITH DIFFERENTIAL/PLATELET
Abs Immature Granulocytes: 0.05 10*3/uL (ref 0.00–0.07)
Basophils Absolute: 0 10*3/uL (ref 0.0–0.1)
Basophils Relative: 1 %
Eosinophils Absolute: 0.3 10*3/uL (ref 0.0–0.5)
Eosinophils Relative: 3 %
HCT: 40.1 % (ref 39.0–52.0)
Hemoglobin: 13 g/dL (ref 13.0–17.0)
Immature Granulocytes: 1 %
Lymphocytes Relative: 19 %
Lymphs Abs: 1.6 10*3/uL (ref 0.7–4.0)
MCH: 31.9 pg (ref 26.0–34.0)
MCHC: 32.4 g/dL (ref 30.0–36.0)
MCV: 98.3 fL (ref 80.0–100.0)
Monocytes Absolute: 1 10*3/uL (ref 0.1–1.0)
Monocytes Relative: 11 %
Neutro Abs: 5.4 10*3/uL (ref 1.7–7.7)
Neutrophils Relative %: 65 %
Platelets: 174 10*3/uL (ref 150–400)
RBC: 4.08 MIL/uL — ABNORMAL LOW (ref 4.22–5.81)
RDW: 12.3 % (ref 11.5–15.5)
WBC: 8.3 10*3/uL (ref 4.0–10.5)
nRBC: 0 % (ref 0.0–0.2)

## 2019-10-21 LAB — PROTIME-INR
INR: 2.6 — ABNORMAL HIGH (ref 0.8–1.2)
Prothrombin Time: 27.8 seconds — ABNORMAL HIGH (ref 11.4–15.2)

## 2019-10-21 NOTE — Progress Notes (Signed)
Patient Care Team: [name], MD as PCP - General (Family Medicine)   HPI  Sean [name]. is a 78 y.o. male Seen in followup for His pacing [id number] complicated lead induced PVCs initially at >9 % .No  EF assessment prior to device implantation     DATE TEST EF   9/15 Myoview 55-65% No ischemia  10/18 Echo   50-55 %   5/19 Echo   30-35 %   7/19 CTA  Prob 3 V CAD  7/19 LHC  3V CAD  12/19 Echo  20-25%   3/20 Echo  25-30%    11/20 Echo  35-40%     Date Cr K Hgb  1/20 1.49 5.0 12.0          A month orthostatic intolerance requiring down titration of medications.   7/19 Cath >> 3V CAD Post cath he developed a left upper extremity DVT and was started on Eliquis >> CABG 9/19 with placing of a LV epicardial lead   11/19  near syncopal episode Imaging demonstrated a large hemomothorax attributed per the family to a slow leak following his bypass surgery >>  transferred to [facility] and underwent VATS for drainage and decortication.  worsening LV function As above -- started on entresto now needs to come off because of cost.  When I saw him 11/19 he was also complaining of pain along his tracked pacemaker lead.  I reached out to Dr. PVT who saw him and then felt that surgical revision was in order.  Accomplished 12/19 according to the patient, Dr. PVT would like [place] to delay for a couple of months prior to re-instrument in the pocket.  1/20 found to have persistent Aflutter > 72 hrs and referred to AFib clinic. ECG  Aflutter A -CL-250 msec , started on apixoban because of cost, he would like to be transitioned to warfarin no bleeding  Overall he feels that his breathing is better. He is using his inhalers less. No chest pain. No bleeding.     Past Medical History:  Diagnosis Date  . Arthritis   . Asthma   . CAD (coronary artery disease)    a. LHC 7/19: ostLM 30%, ostLAD 30%, p-mLAD 99% mod calcified, ostD1 90%, ost-pLCx 80% ulcerative & mod calcified, p-mLCx 50%, OM2  50%, mod dz LPDA, RCA small w/ diffuse dz throughout; b. recommendation of CABG; c. 3-V CABG 08/26/18 (LIMA-LAD, VG-Diag, VG-LCx)  . Chronic systolic CHF (congestive heart failure) (HCC)    a. EF 35-40%, diffuse HK with HK of the apical, anteroseptal, and anterior myocardium, Gr1DD, mild MR, mildly dilated LA, pacer wire noted in the RV with nl RVSF, PASP nl  . CKD (chronic kidney disease), stage III   . Deep vein thrombosis (DVT) of upper extremity ([name])    a. DVT of left subclavian dx'd 06/17/2018; b. Eliquis  . GERD (gastroesophageal reflux disease)    takes otc occasionally  . History of complete heart block    a. s/p MDT PPM 05/2017  . Hypertension   . Obesity   . Presence of permanent cardiac pacemaker    Dr. Beckie Salts implanted 05/2017  . PVC's (premature ventricular contractions)    a. PPM-induced    Past Surgical History:  Procedure Laterality Date  . BACK SURGERY    . CORONARY ARTERY BYPASS GRAFT N/A 08/26/2018   Procedure: CORONARY ARTERY BYPASS GRAFTING (CABG) x3 , Using left internal mammory artery and right leg greater saphronious vein.  Harvested endoscopically.;  Surgeon: Ivin Poot, MD;  Location: [place];  Service: Open Heart Surgery;  Laterality: N/A;  . EPICARDIAL PACING LEAD PLACEMENT N/A 08/26/2018   Procedure: LV PACING LEAD PLACEMENT;  Surgeon: Ivin Poot, MD;  Location: [place];  Service: Thoracic;  Laterality: N/A;  . EPICARDIAL PACING LEAD PLACEMENT Left 11/07/2018   Procedure: REVISE PACEMAKER LEAD LEFT CHEST;  Surgeon: Ivin Poot, MD;  Location: [place];  Service: Thoracic;  Laterality: Left;  . INSERT / REPLACE / REMOVE PACEMAKER     [employer]TRONIC  . LEFT HEART CATH AND CORONARY ANGIOGRAPHY N/A 06/13/2018   Procedure: LEFT HEART CATH AND CORONARY ANGIOGRAPHY;  Surgeon: Wellington Hampshire, MD;  Location: [place] [facility];  Service: Cardiovascular;  Laterality: N/A;  . LEG SURGERY    . PACEMAKER IMPLANT N/A 06/11/2017   Procedure: Pacemaker Implant;   Surgeon: Evans Lance, MD;  Location: [place] [facility];  Service: Cardiovascular;  Laterality: N/A;  . PLEURAL EFFUSION DRAINAGE Left 09/24/2018   Procedure: DRAINAGE OF LOCULATED PLEURAL EFFUSION;  Surgeon: Ivin Poot, MD;  Location: [place];  Service: Thoracic;  Laterality: Left;  . TEE WITHOUT CARDIOVERSION N/A 08/26/2018   Procedure: TRANSESOPHAGEAL ECHOCARDIOGRAM (TEE);  Surgeon: Prescott Gum, Collier Salina, MD;  Location: [place];  Service: Open Heart Surgery;  Laterality: N/A;  . VIDEO ASSISTED THORACOSCOPY Left 09/24/2018   Procedure: VIDEO ASSISTED THORACOSCOPY;  Surgeon: Prescott Gum, Collier Salina, MD;  Location: [facility] OR;  Service: Thoracic;  Laterality: Left;    Current Outpatient Medications  Medication Sig Dispense Refill  . acetaminophen (TYLENOL) 500 MG tablet Take 500 mg by mouth every 6 (six) hours as needed for moderate pain or headache.    . albuterol (PROVENTIL HFA;VENTOLIN HFA) 108 (90 Base) MCG/ACT inhaler Inhale 2 puffs into the lungs every 6 (six) hours as needed for wheezing or shortness of breath.     . allopurinol (ZYLOPRIM) 100 MG tablet Take 100 mg by mouth every other day.     Marland Kitchen atorvastatin (LIPITOR) 40 MG tablet TAKE ONE TABLET EVERY DAY 30 tablet 0  . Fluticasone-Salmeterol (ADVAIR) 100-50 MCG/DOSE AEPB Inhale 1 puff into the lungs daily as needed (shortness of breath). 60 each   . losartan (COZAAR) 50 MG tablet Take 1 tablet (50 mg total) by mouth daily. 90 tablet 3  . metoprolol succinate (TOPROL-XL) 25 MG 24 hr tablet Take 1 tablet (25 mg total) by mouth daily. 90 tablet 2  . Specialty Vitamins Products (PROSTATE PO) Take 1 tablet by mouth 2 (two) times daily.     Marland Kitchen spironolactone (ALDACTONE) 25 MG tablet Take 0.5 tablets (12.5 mg total) by mouth daily. 30 tablet 6  . warfarin (COUMADIN) 5 MG tablet Take 1 tablet (5 mg) by mouth once daily in the evening as directed by the Coumadin Clinic 30 tablet 3   No current facility-administered medications for this visit.      Allergies  Allergen Reactions  . Azithromycin Hives  . Erythromycin Hives      Review of Systems negative except from HPI and PMH  Physical Exam BP 132/64 (BP Location: Left Arm, Patient Position: Sitting, Cuff Size: Normal)   Pulse 71   Ht 5' 9.5" (1.765 m)   Wt 224 lb 8 oz (101.8 kg)   BMI 32.68 kg/m  Well developed and well nourished in no acute distress HENT normal Neck supple with JVP-flat Clear Device pocket well healed; without hematoma or erythema.  There is no tethering  Regular rate and rhythm, no   murmur Abd-soft with active BS No Clubbing cyanosis    edema Skin-warm and dry A & Oriented  Grossly normal sensory and motor function   ECG atrial flutter with underlying ventricular pacing  ECG  12/10/18 Personally reviewed  Aflutter Acl 250 with underlying Vpacing  ECG 12/31/2018 ventricular pacing with underlying atrial flutter  Rapid atrial pacing resulted in termination of atrial flutter and restoration of sinus rhythm.  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 However, not withstanding his improving symptoms, I think the right approach to his atrial flutter is to pursue catheter ablation. This will allow [place] to get him off of his warfarin. We did discuss the possibility of atrial fibrillation and the need to resume anticoagulation.  Continue him on his losartan.  Given the paucity of symptoms, however, and the context of his mildly improved LV function, I would be disinclined at this point to open up his pocket to incorporate his LV lead into his system.  He would like me to talk to his wife. I am glad to do so.  I suggested that we defer his endoscopy until after his  ablation.  Discussed benefits and risks of catheter ablation including but not limited to catastrophes at less than 11/998 and bleeding  We spent more than 50% of our >25 min visit in face to face counseling regarding the above

## 2019-10-21 NOTE — Patient Instructions (Addendum)
Medication Instructions:  - Your physician recommends that you continue on your current medications as directed. Please refer to the Current Medication list given to you today.  *If you need a refill on your cardiac medications before your next appointment, please call your pharmacy*  Lab Work: - Your physician recommends that you have lab work today: INR/ CBC  If you have labs (blood work) drawn today and your tests are completely normal, you will receive your results only by: Marland Kitchen MyChart Message (if you have MyChart) OR . A paper copy in the mail If you have any lab test that is abnormal or we need to change your treatment, we will call you to review the results.  Testing/Procedures: - Your physician has recommended that you have an Atrial Flutter ablation. Catheter ablation is a medical procedure used to treat some cardiac arrhythmias (irregular heartbeats). During catheter ablation, a long, thin, flexible tube is put into a blood vessel in your groin (upper thigh), or neck. This tube is called an ablation catheter. It is then guided to your heart through the blood vessel. Radio frequency waves destroy small areas of heart tissue where abnormal heartbeats may cause an arrhythmia to start. Please see the instruction sheet given to you today.   Follow-Up: At [facility], you and your health needs are our priority.  As part of our continuing mission to provide you with exceptional heart care, we have created designated Provider Care Teams.  These Care Teams include your primary Cardiologist (physician) and Advanced Practice Providers (APPs -  Physician Assistants and Nurse Practitioners) who all work together to provide you with the care you need, when you need it.  Your next appointment:   Pending    The format for your next appointment:   pending  Provider:   pending  Other Instructions N/a   Atrial Flutter  Atrial flutter is a type of abnormal heart rhythm (arrhythmia). The  heart has an electrical system that tells the heart how to beat. In atrial flutter, the signals move rapidly in the top chambers of the heart (the atria). This makes your heart beat very fast. Atrial flutter can come and go, or it can be permanent. If this condition is not treated it can cause serious complications, such as stroke or weakened heart muscle (cardiomyopathy). What are the causes? This condition may be caused by:  A heart condition or problem, such as: ? A heart attack. ? Heart failure. ? A heart valve problem. ? Heart surgery.  A lung problem, such as: ? A blood clot in the lungs (pulmonary embolism, or PE). ? Chronic obstructive pulmonary disease.  Poorly controlled high blood pressure (hypertension).  Overactive thyroid (hyperthyroidism).  Caffeine.  Some decongestant cold medicines.  Low levels of minerals called electrolytes in the blood.  Cocaine. What increases the risk? You are more likely to develop this condition if:  You are an elderly adult.  You are a man.  You are obese.  You have obstructive sleep apnea.  You have a family history of atrial flutter.  You have diabetes. What are the signs or symptoms? Symptoms of this condition include:  A feeling that your heart is pounding or racing (palpitations).  Shortness of breath.  Chest pain.  Feeling light-headed.  Dizziness.  Fainting.  Low blood pressure (hypotension).  Fatigue. Sometimes there are no symptoms associated with arrhythmia. How is this diagnosed? This condition may be diagnosed based on:  An electrocardiogram (ECG). This is a test that  records the electrical signals in the heart.  Ambulatory cardiac monitoring. This is a small recording device that is connected by wires to flat, sticky disks (electrodes) that are attached to your chest.  An echocardiogram. This is a test that uses sound waves to create pictures of your heart.  A transesophageal echocardiogram  (TEE). In this test, a device is placed down your esophagus. This device then uses sounds waves to create even closer pictures of your heart.  Stress test. This test records your heartbeat while you exercise and checks to see if the heart muscle is receiving adequate blood supply. How is this treated? This condition may be treated with:  Medicines to: ? Make your heart beat more slowly. ? Keep your heart in normal rhythm. ? Prevent a stroke.  Cardioversion. This uses medicines or an electrical shock to make the heart beat normally.  Ablation. This destroys the heart tissue that is causing the problem. In some cases, your health care provider will treat other underlying conditions. Follow these instructions at home: Medicines  Take over-the-counter and prescription medicines only as told by your health care provider. ? Make sure you take your medicines exactly as told by your health care provider. ? Do not miss any doses.  Do not take any new medicines without talking to your health care provider. Lifestyle  Eat heart-healthy foods. Talk with a dietitian to make an eating plan that is right for you.  Do not use any products that contain nicotine or tobacco, such as cigarettes and e-cigarettes. If you need help quitting, ask your health care provider.  Limit alcohol intake to no more than 1 drink per day for nonpregnant women and 2 drinks per day for men. One drink equals 12 oz of beer, 5 oz of wine, or 1 oz of hard liquor.  Try to reduce any stress. Stress can make your symptoms worse.  Get screened for sleep apnea. If you have the condition, work with your health care provider to find a treatment that works for you.  Do not use drugs.  Avoid excessive caffeine. General instructions  Lose weight if your health care provider tells you to do that.  Keep all follow-up visits as told by your health care provider. This is important. Contact a health care provider if:  Your  symptoms get worse.  You notice that your palpitations are increasing. Get help right away if:  You have any symptoms of a stroke. "BE FAST" is an easy way to remember the main warning signs of a stroke: ? B - Balance. Signs are dizziness, sudden trouble walking, or loss of balance. ? E - Eyes. Signs are trouble seeing or a sudden change in vision. ? F - Face. Signs are sudden weakness or numbness of the face, or the face or eyelid drooping on one side. ? A - Arms. Signs are weakness or numbness in an arm. This happens suddenly and usually on one side of the body. ? S - Speech. Signs are sudden trouble speaking, slurred speech, or trouble understanding what people say. ? T - Time. Time to call emergency services. Write down what time symptoms started.  You have other signs of a stroke, such as: ? A sudden, severe headache with no known cause. ? Nausea or vomiting. ? Seizure.  You have additional symptoms, such as: ? Fainting. ? Shortness of breath. ? Pain or pressure in your chest. ? Suddenly feeling nauseous or suddenly vomiting. ? Increased sweating with no known  cause.  These symptoms may represent a serious problem that is an emergency. Do not wait to see if the symptoms will go away. Get medical help right away. Call your local emergency services (911 in the [place] Do not drive yourself to the hospital. Summary  Atrial flutter is an abnormal heart rhythm that can give you symptoms of palpitations, shortness of breath, or fatigue.  Atrial flutter is often treated with medicines to keep your heart in a normal rhythm and to prevent a stroke.  You should seek immediate help if you cannot catch your breath, have chest pain or pressure, or have weakness, especially on one side of your body. This information is not intended to replace advice given to you by your health care provider. Make sure you discuss any questions you have with your health care provider. Document Released:  03/25/2009 Document Revised: 07/26/2018 Document Reviewed: 08/09/2017 Elsevier Patient Education  2020 [place].    Cardiac Ablation Cardiac ablation is a procedure to disable (ablate) a small amount of heart tissue in very specific places. The heart has many electrical connections. Sometimes these connections are abnormal and can cause the heart to beat very fast or irregularly. Ablating some of the problem areas can improve the heart rhythm or return it to normal. Ablation may be done for people who:  Have Wolff-Parkinson-White syndrome.  Have fast heart rhythms (tachycardia).  Have taken medicines for an abnormal heart rhythm (arrhythmia) that were not effective or caused side effects.  Have a high-risk heartbeat that may be life-threatening. During the procedure, a small incision is made in the neck or the groin, and a long, thin, flexible tube (catheter) is inserted into the incision and moved to the heart. Small devices (electrodes) on the tip of the catheter will send out electrical currents. A type of X-ray (fluoroscopy) will be used to help guide the catheter and to provide images of the heart. Tell a health care provider about:  Any allergies you have.  All medicines you are taking, including vitamins, herbs, eye drops, creams, and over-the-counter medicines.  Any problems you or family members have had with anesthetic medicines.  Any blood disorders you have.  Any surgeries you have had.  Any medical conditions you have, such as kidney failure.  Whether you are pregnant or may be pregnant. What are the risks? Generally, this is a safe procedure. However, problems may occur, including:  Infection.  Bruising and bleeding at the catheter insertion site.  Bleeding into the chest, especially into the sac that surrounds the heart. This is a serious complication.  Stroke or blood clots.  Damage to other structures or organs.  Allergic reaction to medicines or  dyes.  Need for a permanent pacemaker if the normal electrical system is damaged. A pacemaker is a small computer that sends electrical signals to the heart and helps your heart beat normally.  The procedure not being fully effective. This may not be recognized until months later. Repeat ablation procedures are sometimes required. What happens before the procedure?  Follow instructions from your health care provider about eating or drinking restrictions.  Ask your health care provider about: ? Changing or stopping your regular medicines. This is especially important if you are taking diabetes medicines or blood thinners. ? Taking medicines such as aspirin and ibuprofen. These medicines can thin your blood. Do not take these medicines before your procedure if your health care provider instructs you not to.  Plan to have someone take you home  from the hospital or clinic.  If you will be going home right after the procedure, plan to have someone with you for 24 hours. What happens during the procedure?  To lower your risk of infection: ? Your health care team will wash or sanitize their hands. ? Your skin will be washed with soap. ? Hair may be removed from the incision area.  An IV tube will be inserted into one of your veins.  You will be given a medicine to help you relax (sedative).  The skin on your neck or groin will be numbed.  An incision will be made in your neck or your groin.  A needle will be inserted through the incision and into a large vein in your neck or groin.  A catheter will be inserted into the needle and moved to your heart.  Dye may be injected through the catheter to help your surgeon see the area of the heart that needs treatment.  Electrical currents will be sent from the catheter to ablate heart tissue in desired areas. There are three types of energy that may be used to ablate heart tissue: ? Heat (radiofrequency energy). ? Laser energy. ? Extreme cold  (cryoablation).  When the necessary tissue has been ablated, the catheter will be removed.  Pressure will be held on the catheter insertion area to prevent excessive bleeding.  A bandage (dressing) will be placed over the catheter insertion area. The procedure may vary among health care providers and hospitals. What happens after the procedure?  Your blood pressure, heart rate, breathing rate, and blood oxygen level will be monitored until the medicines you were given have worn off.  Your catheter insertion area will be monitored for bleeding. You will need to lie still for a few hours to ensure that you do not bleed from the catheter insertion area.  Do not drive for 24 hours or as long as directed by your health care provider. Summary  Cardiac ablation is a procedure to disable (ablate) a small amount of heart tissue in very specific places. Ablating some of the problem areas can improve the heart rhythm or return it to normal.  During the procedure, electrical currents will be sent from the catheter to ablate heart tissue in desired areas. This information is not intended to replace advice given to you by your health care provider. Make sure you discuss any questions you have with your health care provider. Document Released: 03/25/2009 Document Revised: 04/29/2018 Document Reviewed: 09/25/2016 Elsevier Patient Education  2020 [employer].

## 2019-10-23 ENCOUNTER — Telehealth: Payer: Self-pay | Admitting: Internal Medicine

## 2019-10-23 NOTE — Telephone Encounter (Signed)
Call received from Dr. Caryl Comes that he called and spoke with the patient and his wife by phone further about the patient having an atrial flutter ablation.  Per Dr. Caryl Comes, the patient and his wife are agreeable to this in the new year. Dr. Caryl Comes advised the patient will need bi-weekly INR checks until his flutter ablation.  I have called and advised the patient to keep his appt on 12/14 with [facility] for his INR check. He is aware I will update her of the need for him to have bi-week INR checks and that I will call him soon with some dates for his ablation to be done.  The patient voices understanding and is agreeable.

## 2019-10-29 ENCOUNTER — Other Ambulatory Visit: Payer: Self-pay | Admitting: Internal Medicine

## 2019-10-29 ENCOUNTER — Ambulatory Visit (INDEPENDENT_AMBULATORY_CARE_PROVIDER_SITE_OTHER): Payer: PPO | Admitting: *Deleted

## 2019-10-29 DIAGNOSIS — Z950 Presence of cardiac pacemaker: Secondary | ICD-10-CM

## 2019-10-29 LAB — CUP PACEART REMOTE DEVICE CHECK
Battery Remaining Longevity: 78 mo
Battery Voltage: 2.97 V
Brady Statistic RA Percent Paced: 0.07 %
Brady Statistic RV Percent Paced: 99.99 %
Date Time Interrogation Session: 20201209102008
Implantable Lead Implant Date: 20180723
Implantable Lead Implant Date: 20180723
Implantable Lead Location: 753859
Implantable Lead Location: 753860
Implantable Lead Model: 3830
Implantable Lead Model: 5076
Implantable Pulse Generator Implant Date: 20180723
Lead Channel Impedance Value: 304 Ohm
Lead Channel Impedance Value: 342 Ohm
Lead Channel Impedance Value: 380 Ohm
Lead Channel Impedance Value: 513 Ohm
Lead Channel Pacing Threshold Amplitude: 0.5 V
Lead Channel Pacing Threshold Amplitude: 1.125 V
Lead Channel Pacing Threshold Pulse Width: 0.4 ms
Lead Channel Pacing Threshold Pulse Width: 0.4 ms
Lead Channel Sensing Intrinsic Amplitude: 1.75 mV
Lead Channel Sensing Intrinsic Amplitude: 1.75 mV
Lead Channel Sensing Intrinsic Amplitude: 7.625 mV
Lead Channel Sensing Intrinsic Amplitude: 9.25 mV
Lead Channel Setting Pacing Amplitude: 2 V
Lead Channel Setting Pacing Amplitude: 2.5 V
Lead Channel Setting Pacing Pulse Width: 1 ms
Lead Channel Setting Sensing Sensitivity: 2.8 mV

## 2019-10-29 NOTE — Telephone Encounter (Signed)
This is a [place] pt 

## 2019-11-03 ENCOUNTER — Ambulatory Visit (INDEPENDENT_AMBULATORY_CARE_PROVIDER_SITE_OTHER): Payer: PPO

## 2019-11-03 ENCOUNTER — Other Ambulatory Visit: Payer: Self-pay

## 2019-11-03 DIAGNOSIS — Z5181 Encounter for therapeutic drug level monitoring: Secondary | ICD-10-CM

## 2019-11-03 DIAGNOSIS — I4892 Unspecified atrial flutter: Secondary | ICD-10-CM

## 2019-11-03 LAB — POCT INR: INR: 2.3 (ref 2.0–3.0)

## 2019-11-03 NOTE — Patient Instructions (Signed)
Please continue dosage of 1 tablet of warfarin every day EXCEPT 1/2 TABLET on [place]. Recheck in 6 weeks.

## 2019-11-03 NOTE — Telephone Encounter (Signed)
Mr. Bradway came in today for his INR check and he said that he wants to postpone his ablation until after the pandemic settles down a bit.  He and his wife are nervous about the increase in cases in [facility] and they would both feel better about putting if off for a little longer, especially since it's not an emergency.  He would like for Heather to call him so he can discuss this w/ her.  I went ahead and scheduled him for another INR check in 6 weeks, but I told him that if Nira Conn talked him into proceeding w/ the ablation, that I would need to get him back in for checks every 2 weeks.  He said that she would "have to be very convincing" to get him to change his mind.  Please let me know how you would like to proceed.  Thank you!

## 2019-11-04 ENCOUNTER — Other Ambulatory Visit: Payer: Self-pay | Admitting: Cardiovascular Disease

## 2019-11-04 ENCOUNTER — Other Ambulatory Visit: Payer: Self-pay

## 2019-11-04 MED ORDER — WARFARIN SODIUM 5 MG PO TABS: ORAL_TABLET | ORAL | 3 refills | Status: DC

## 2019-11-04 NOTE — Telephone Encounter (Signed)
I spoke with the patient and advised I could appreciate his concern in being cautious in regards to have his flutter ablation at this time in light of COVID.  I voiced I would be fine in waiting to schedule this for now. I asked if he would be ok to schedule a follow up appointment with Dr. Caryl Comes in 2-3 months and he advised he would be ok with that.   He is aware I will forward a message to Dr. Caryl Comes and see if he would prefer an in person follow up or would a virtual appointment be ok.  The patient advised he would be fine either way. He is aware I will follow up with him once I receive a response back from Dr. Caryl Comes, which could be after the 1st of the year as Dr. Caryl Comes is out until the last week of December, but then I am out that week.   The patient was fine to wait for a response.

## 2019-11-04 NOTE — Addendum Note (Signed)
Addended by: Allean Found on: 11/04/2019 02:55 PM   Modules accepted: Orders

## 2019-11-04 NOTE — Telephone Encounter (Signed)
*  STAT* If patient is at the pharmacy, call can be transferred to refill team.   1. Which medications need to be refilled? (please list name of each medication and dose if known) warfarin  2. Which pharmacy/location (including street and city if local pharmacy) is medication to be sent to? [facility]  3. Do they need a 30 day or 90 day supply? Not specified on fax

## 2019-11-06 LAB — CUP PACEART INCLINIC DEVICE CHECK
Battery Remaining Longevity: 77 mo
Battery Voltage: 2.97 V
Brady Statistic AP VP Percent: 98.5 %
Brady Statistic AP VS Percent: 0.01 %
Brady Statistic AS VP Percent: 13.55 %
Brady Statistic AS VS Percent: 0 %
Brady Statistic RA Percent Paced: 0.1 %
Brady Statistic RV Percent Paced: 99.99 %
Date Time Interrogation Session: 20201201115100
Implantable Lead Implant Date: 20180723
Implantable Lead Implant Date: 20180723
Implantable Lead Location: 753859
Implantable Lead Location: 753860
Implantable Lead Model: 3830
Implantable Lead Model: 5076
Implantable Pulse Generator Implant Date: 20180723
Lead Channel Impedance Value: 285 Ohm
Lead Channel Impedance Value: 323 Ohm
Lead Channel Impedance Value: 361 Ohm
Lead Channel Impedance Value: 494 Ohm
Lead Channel Pacing Threshold Amplitude: 0.75 V
Lead Channel Pacing Threshold Pulse Width: 1 ms
Lead Channel Sensing Intrinsic Amplitude: 2.125 mV
Lead Channel Setting Pacing Amplitude: 2 V
Lead Channel Setting Pacing Amplitude: 2.5 V
Lead Channel Setting Pacing Pulse Width: 1 ms
Lead Channel Setting Sensing Sensitivity: 2.8 mV

## 2019-11-09 NOTE — Telephone Encounter (Signed)
Heather--that's fine  Thanks SK

## 2019-11-10 NOTE — Telephone Encounter (Signed)
Will forward to scheduling to reach out to the patient to schedule a follow up appointment in 2-3 months per Dr. Caryl Comes- this can be in person or virtual depending on the patient's preference.

## 2019-11-11 ENCOUNTER — Emergency Department: Payer: PPO

## 2019-11-11 ENCOUNTER — Other Ambulatory Visit: Payer: Self-pay

## 2019-11-11 ENCOUNTER — Emergency Department
Admission: EM | Admit: 2019-11-11 | Discharge: 2019-11-12 | Disposition: A | Discharge status: Home / Self Care | Payer: PPO | Attending: Emergency Medicine | Admitting: Emergency Medicine

## 2019-11-11 DIAGNOSIS — I130 Hypertensive heart and chronic kidney disease with heart failure and stage 1 through stage 4 chronic kidney disease, or unspecified chronic kidney disease: Secondary | ICD-10-CM | POA: Insufficient documentation

## 2019-11-11 DIAGNOSIS — I2510 Atherosclerotic heart disease of native coronary artery without angina pectoris: Secondary | ICD-10-CM | POA: Insufficient documentation

## 2019-11-11 DIAGNOSIS — N390 Urinary tract infection, site not specified: Secondary | ICD-10-CM | POA: Diagnosis not present

## 2019-11-11 DIAGNOSIS — N183 Chronic kidney disease, stage 3 unspecified: Secondary | ICD-10-CM | POA: Insufficient documentation

## 2019-11-11 DIAGNOSIS — J45909 Unspecified asthma, uncomplicated: Secondary | ICD-10-CM | POA: Insufficient documentation

## 2019-11-11 DIAGNOSIS — I5022 Chronic systolic (congestive) heart failure: Secondary | ICD-10-CM | POA: Insufficient documentation

## 2019-11-11 DIAGNOSIS — Z79899 Other long term (current) drug therapy: Secondary | ICD-10-CM | POA: Diagnosis not present

## 2019-11-11 DIAGNOSIS — Z20828 Contact with and (suspected) exposure to other viral communicable diseases: Secondary | ICD-10-CM | POA: Diagnosis not present

## 2019-11-11 DIAGNOSIS — Z7901 Long term (current) use of anticoagulants: Secondary | ICD-10-CM | POA: Insufficient documentation

## 2019-11-11 DIAGNOSIS — R509 Fever, unspecified: Secondary | ICD-10-CM | POA: Diagnosis present

## 2019-11-11 LAB — LACTIC ACID, PLASMA: Lactic Acid, Venous: 2.2 mmol/L (ref 0.5–1.9)

## 2019-11-11 LAB — COMPREHENSIVE METABOLIC PANEL
ALT: 19 U/L (ref 0–44)
AST: 26 U/L (ref 15–41)
Albumin: 4.4 g/dL (ref 3.5–5.0)
Alkaline Phosphatase: 39 U/L (ref 38–126)
Anion gap: 10 (ref 5–15)
BUN: 37 mg/dL — ABNORMAL HIGH (ref 8–23)
CO2: 23 mmol/L (ref 22–32)
Calcium: 8.9 mg/dL (ref 8.9–10.3)
Chloride: 104 mmol/L (ref 98–111)
Creatinine, Ser: 1.65 mg/dL — ABNORMAL HIGH (ref 0.61–1.24)
GFR calc Af Amer: 46 mL/min — ABNORMAL LOW (ref >60)
GFR calc non Af Amer: 39 mL/min — ABNORMAL LOW (ref >60)
Glucose, Bld: 133 mg/dL — ABNORMAL HIGH (ref 70–99)
Potassium: 5.1 mmol/L (ref 3.5–5.1)
Sodium: 137 mmol/L (ref 135–145)
Total Bilirubin: 1.1 mg/dL (ref 0.3–1.2)
Total Protein: 7.1 g/dL (ref 6.5–8.1)

## 2019-11-11 LAB — CBC WITH DIFFERENTIAL/PLATELET
Abs Immature Granulocytes: 0.05 10*3/uL (ref 0.00–0.07)
Basophils Absolute: 0 10*3/uL (ref 0.0–0.1)
Basophils Relative: 0 %
Eosinophils Absolute: 0.2 10*3/uL (ref 0.0–0.5)
Eosinophils Relative: 1 %
HCT: 39.3 % (ref 39.0–52.0)
Hemoglobin: 12.9 g/dL — ABNORMAL LOW (ref 13.0–17.0)
Immature Granulocytes: 0 %
Lymphocytes Relative: 7 %
Lymphs Abs: 1 10*3/uL (ref 0.7–4.0)
MCH: 32 pg (ref 26.0–34.0)
MCHC: 32.8 g/dL (ref 30.0–36.0)
MCV: 97.5 fL (ref 80.0–100.0)
Monocytes Absolute: 0.8 10*3/uL (ref 0.1–1.0)
Monocytes Relative: 6 %
Neutro Abs: 12.5 10*3/uL — ABNORMAL HIGH (ref 1.7–7.7)
Neutrophils Relative %: 86 %
Platelets: 150 10*3/uL (ref 150–400)
RBC: 4.03 MIL/uL — ABNORMAL LOW (ref 4.22–5.81)
RDW: 12.2 % (ref 11.5–15.5)
WBC: 14.5 10*3/uL — ABNORMAL HIGH (ref 4.0–10.5)
nRBC: 0 % (ref 0.0–0.2)

## 2019-11-11 LAB — APTT: aPTT: 36 seconds (ref 24–36)

## 2019-11-11 LAB — PROTIME-INR
INR: 2.5 — ABNORMAL HIGH (ref 0.8–1.2)
Prothrombin Time: 27.2 seconds — ABNORMAL HIGH (ref 11.4–15.2)

## 2019-11-11 MED ORDER — SODIUM CHLORIDE 0.9 % IV BOLUS
1000.0000 mL | Freq: Once | INTRAVENOUS | Status: AC
  Administered 2019-11-12: 1000 mL via INTRAVENOUS

## 2019-11-11 MED ORDER — ACETAMINOPHEN 325 MG PO TABS
650.0000 mg | ORAL_TABLET | Freq: Once | ORAL | Status: AC | PRN
  Administered 2019-11-11: 650 mg via ORAL

## 2019-11-11 NOTE — Progress Notes (Signed)
CODE SEPSIS - PHARMACY COMMUNICATION  **Broad Spectrum Antibiotics should be administered within 1 hour of Sepsis diagnosis**  Time Code Sepsis Called/Page Received: 2208  Antibiotics Ordered: Rocephin  Time of 1st antibiotic administration: 0033  Additional action taken by pharmacy: discussed w/ RN, ABX not ordered initially so there was a delay  If necessary, Name of Provider/Nurse Contacted: Grier Rocher, Elayne Snare ,PharmD Clinical Pharmacist  11/11/2019  10:11 PM

## 2019-11-11 NOTE — ED Provider Notes (Signed)
[facility] Emergency Department Provider Note ____________________________________________   First MD Initiated Contact with Patient 11/11/19 2342     (approximate)  I have reviewed the triage vital signs and the nursing notes.   HISTORY  Chief Complaint Fever    HPI Bernal Komm. is a 78 y.o. male with PMH as noted below who presents with fever and chills, acute onset around 2 PM today, associated with feeling shaky.  The patient states he was feeling fine earlier today, although he states he took an Alka-Seltzer "for the hell of it" and then the symptoms started afterwards.  He denies any vomiting or diarrhea, cough, shortness of breath, dysuria, or other acute symptoms.  He denies any sick contacts and states that he and his wife have been staying home and avoiding contact with others due to COVID-19.  Past Medical History:  Diagnosis Date  . Arthritis   . Asthma   . CAD (coronary artery disease)    a. LHC 7/19: ostLM 30%, ostLAD 30%, p-mLAD 99% mod calcified, ostD1 90%, ost-pLCx 80% ulcerative & mod calcified, p-mLCx 50%, OM2 50%, mod dz LPDA, RCA small w/ diffuse dz throughout; b. recommendation of CABG; c. 3-V CABG 08/26/18 (LIMA-LAD, VG-Diag, VG-LCx)  . Chronic systolic CHF (congestive heart failure) (HCC)    a. EF 35-40%, diffuse HK with HK of the apical, anteroseptal, and anterior myocardium, Gr1DD, mild MR, mildly dilated LA, pacer wire noted in the RV with nl RVSF, PASP nl  . CKD (chronic kidney disease), stage III   . Deep vein thrombosis (DVT) of upper extremity ([place])    a. DVT of left subclavian dx'd 06/17/2018; b. Eliquis  . GERD (gastroesophageal reflux disease)    takes otc occasionally  . History of complete heart block    a. s/p MDT PPM 05/2017  . Hypertension   . Obesity   . Presence of permanent cardiac pacemaker    Dr. Beckie Salts implanted 05/2017  . PVC's (premature ventricular contractions)    a. PPM-induced    Patient Active  Problem List   Diagnosis Date Noted  . Ischemic cardiomyopathy 06/12/2019  . Cardiac pacemaker in situ 06/12/2019  . PVC's (premature ventricular contractions) 06/12/2019  . Atrial flutter ([place]) 06/12/2019  . Pleural effusion, left 09/24/2018  . Hemothorax on left 09/22/2018  . 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 Vertigo 06/26/2018  . Coronary artery disease   . Shortness of breath   . Complete heart block ([place]) 06/11/2017  . Acute kidney injury superimposed on CKD ([place]) 06/11/2017  . Obesity 06/11/2017  . Gout 06/11/2017  . Acute CHF (congestive heart failure) ([place]) 06/11/2017    Past Surgical History:  Procedure Laterality Date  . BACK SURGERY    . CORONARY ARTERY BYPASS GRAFT N/A 08/26/2018   Procedure: CORONARY ARTERY BYPASS GRAFTING (CABG) x3 , Using left internal mammory artery and right leg greater saphronious vein. Harvested endoscopically.;  Surgeon: Ivin Poot, MD;  Location: [place];  Service: Open Heart Surgery;  Laterality: N/A;  . EPICARDIAL PACING LEAD PLACEMENT N/A 08/26/2018   Procedure: LV PACING LEAD PLACEMENT;  Surgeon: Ivin Poot, MD;  Location: [place];  Service: Thoracic;  Laterality: N/A;  . EPICARDIAL PACING LEAD PLACEMENT Left 11/07/2018   Procedure: REVISE PACEMAKER LEAD LEFT CHEST;  Surgeon: Ivin Poot, MD;  Location: [place];  Service: Thoracic;  Laterality: Left;  . INSERT / REPLACE / REMOVE PACEMAKER     [employer]  . LEFT  HEART CATH AND CORONARY ANGIOGRAPHY N/A 06/13/2018   Procedure: LEFT HEART CATH AND CORONARY ANGIOGRAPHY;  Surgeon: Wellington Hampshire, MD;  Location: [place] [facility];  Service: Cardiovascular;  Laterality: N/A;  . LEG SURGERY    . PACEMAKER IMPLANT N/A 06/11/2017   Procedure: Pacemaker Implant;  Surgeon: Evans Lance, MD;  Location: [place] [facility];  Service: Cardiovascular;  Laterality: N/A;  . PLEURAL EFFUSION DRAINAGE Left 09/24/2018   Procedure: DRAINAGE OF LOCULATED PLEURAL EFFUSION;  Surgeon: Ivin Poot, MD;   Location: [place];  Service: Thoracic;  Laterality: Left;  . TEE WITHOUT CARDIOVERSION N/A 08/26/2018   Procedure: TRANSESOPHAGEAL ECHOCARDIOGRAM (TEE);  Surgeon: Prescott Gum, Collier Salina, MD;  Location: [place];  Service: Open Heart Surgery;  Laterality: N/A;  . VIDEO ASSISTED THORACOSCOPY Left 09/24/2018   Procedure: VIDEO ASSISTED THORACOSCOPY;  Surgeon: Prescott Gum, Collier Salina, MD;  Location: [place];  Service: Thoracic;  Laterality: Left;    Prior to Admission medications   Medication Sig Start Date End Date Taking? Authorizing Provider  acetaminophen (TYLENOL) 500 MG tablet Take 500 mg by mouth every 6 (six) hours as needed for moderate pain or headache.   Yes [provider]  albuterol (PROVENTIL HFA;VENTOLIN HFA) 108 (90 Base) MCG/ACT inhaler Inhale 2 puffs into the lungs every 6 (six) hours as needed for wheezing or shortness of breath.    Yes [provider]  allopurinol (ZYLOPRIM) 100 MG tablet Take 100 mg by mouth every other day.    Yes [provider]  atorvastatin (LIPITOR) 40 MG tablet TAKE ONE TABLET EVERY DAY 11/05/19  Yes Wellington Hampshire, MD  Fluticasone-Salmeterol (ADVAIR) 100-50 MCG/DOSE AEPB Inhale 1 puff into the lungs daily as needed (shortness of breath). 09/03/18  Yes Lars Pinks M, PA-C  losartan (COZAAR) 50 MG tablet Take 1 tablet (50 mg total) by mouth daily. 06/12/19 11/12/19 Yes Deboraha Sprang, MD  metoprolol succinate (TOPROL-XL) 25 MG 24 hr tablet Take 1 tablet (25 mg total) by mouth daily. 07/08/19  Yes Deboraha Sprang, MD  Specialty Vitamins Products (PROSTATE PO) Take 1 tablet by mouth every other day.    Yes [provider]  spironolactone (ALDACTONE) 25 MG tablet TAKE 1/2 TABLET BY MOUTH EVERY DAY 10/29/19  Yes Deboraha Sprang, MD  warfarin (COUMADIN) 5 MG tablet Take 1 tablet (5 mg) by mouth once daily in the evening as directed by the [facility] 11/04/19  Yes Deboraha Sprang, MD  cephALEXin (KEFLEX) 500 MG capsule Take 1 capsule  (500 mg total) by mouth 2 (two) times daily for 10 days. 11/12/19 11/22/19  Arta Silence, MD    Allergies Azithromycin and Erythromycin  Family History  Problem Relation Age of Onset  . Stroke Mother   . Stroke Father   . Alcohol abuse Father   . Diabetes Brother   . Alcohol abuse Brother   . Stroke Maternal Grandfather     Social History Social History   Tobacco Use  . Smoking status: Never Smoker  . Smokeless tobacco: Never Used  Substance Use Topics  . Alcohol use: Yes    Comment: occ.  . Drug use: No    Review of Systems  Constitutional: Positive for fever. Eyes: No redness. ENT: No sore throat. Cardiovascular: Denies chest pain. Respiratory: Denies shortness of breath. Gastrointestinal: No vomiting or diarrhea.  Genitourinary: Negative for dysuria.  Musculoskeletal: Negative for back pain. Skin: Negative for rash. Neurological: Negative for headache.   ____________________________________________  PHYSICAL EXAM:  VITAL SIGNS: ED Triage Vitals  Enc Vitals Group     BP 11/11/19 2251 (!) 166/78     Pulse Rate 11/11/19 2202 70     Resp 11/11/19 2202 18     Temp 11/11/19 2202 (!) 101.5 F (38.6 C)     Temp Source 11/11/19 2202 Oral     SpO2 11/11/19 2202 98 %     Weight 11/11/19 2203 218 lb (98.9 kg)     Height 11/11/19 2203 5\' 9"  (1.753 m)     Head Circumference --      Peak Flow --      Pain Score 11/11/19 2203 0     Pain Loc --      Pain Edu? --      Excl. in [place]? --     Constitutional: Alert and oriented. Well appearing and in no acute distress. Eyes: Conjunctivae are normal.  Head: Atraumatic. Nose: No congestion/rhinnorhea. Mouth/Throat: Mucous membranes are slightly dry.   Neck: Normal range of motion.  Cardiovascular: Normal rate, regular rhythm. Good peripheral circulation. Respiratory: Normal respiratory effort.  No retractions.  Gastrointestinal: Soft and nontender. No distention.  Genitourinary: No flank  tenderness. Musculoskeletal: Extremities warm and well perfused.  Neurologic:  Normal speech and language. No gross focal neurologic deficits are appreciated.  Skin:  Skin is warm and dry. No rash noted. Psychiatric: Mood and affect are normal. Speech and behavior are normal.  ____________________________________________   LABS (all labs ordered are listed, but only abnormal results are displayed)  Labs Reviewed  LACTIC ACID, PLASMA - Abnormal; Notable for the following components:      Result Value   Lactic Acid, Venous 2.2 (*)    All other components within normal limits  COMPREHENSIVE METABOLIC PANEL - Abnormal; Notable for the following components:   Glucose, Bld 133 (*)    BUN 37 (*)    Creatinine, Ser 1.65 (*)    GFR calc non Af Amer 39 (*)    GFR calc Af Amer 46 (*)    All other components within normal limits  CBC WITH DIFFERENTIAL/PLATELET - Abnormal; Notable for the following components:   WBC 14.5 (*)    RBC 4.03 (*)    Hemoglobin 12.9 (*)    Neutro Abs 12.5 (*)    All other components within normal limits  PROTIME-INR - Abnormal; Notable for the following components:   Prothrombin Time 27.2 (*)    INR 2.5 (*)    All other components within normal limits  URINALYSIS, COMPLETE (UACMP) WITH MICROSCOPIC - Abnormal; Notable for the following components:   Color, Urine YELLOW (*)    APPearance CLOUDY (*)    Hgb urine dipstick SMALL (*)    Nitrite POSITIVE (*)    Leukocytes,Ua LARGE (*)    WBC, UA >50 (*)    Bacteria, UA RARE (*)    All other components within normal limits  RESPIRATORY PANEL BY RT PCR (FLU A&B, COVID)  CULTURE, BLOOD (ROUTINE X 2)  CULTURE, BLOOD (ROUTINE X 2)  URINE CULTURE  LACTIC ACID, PLASMA  APTT   ____________________________________________  EKG  ED ECG REPORT I, Arta Silence, the attending physician, personally viewed and interpreted this ECG.  Date: 11/11/2019 EKG Time: 00 27 Rate: 70 Rhythm: Paced rhythm QRS Axis:  normal Intervals: normal ST/T Wave abnormalities: normal Narrative Interpretation: no evidence of acute ischemia  ____________________________________________  RADIOLOGY  CXR: Streaky bibasilar atelectasis with no focal infiltrate or other acute abnormality ____________________________________________  PROCEDURES  Procedure(s) performed: No  Procedures  Critical Care performed: No ____________________________________________   INITIAL IMPRESSION / ASSESSMENT AND PLAN / ED COURSE  Pertinent labs & imaging results that were available during my care of the patient were reviewed by me and considered in my medical decision making (see chart for details).  78 year old male with PMH as noted above including CAD, CHF, and asthma presents with chills and shakes since around 2 PM today along with a fever.  However, the patient's review of systems is otherwise largely negative.  He denies any sick contacts or known exposure to someone with COVID-19.  I reviewed the past medical records in [place].  The patient has no recent prior ED visits.  He was last admitted in November 2019 for a hemothorax after CABG.  On exam today, the patient is overall very well-appearing.  His vital signs are normal except for a fever and mild hypertension.  He has no increased work of breathing or respiratory distress.  The rest of the exam is unremarkable.  Overall presentation is consistent with infection, however the source is unclear based on the lack of other symptoms besides the fever and chills.  Differential includes COVID-19, influenza, other viral syndrome, or less likely pneumonia, UTI.  Sepsis work-up is in progress.  We will obtain a chest x-ray, urinalysis COVID-19 and flu swabs, and reassess.  ----------------------------------------- 2:44 AM on 11/12/2019 -----------------------------------------  Initial labs revealed elevated WBC count and mildly elevated lactate, so the patient received a  fluid bolus.  Chest x-ray shows no focal infiltrate, and the patient is tested negative for Covid and influenza.  The urinalysis shows findings consistent with a UTI, with WBCs and nitrates.  Although the patient has no specific urinary symptoms, given his age this is in fact consistent with a UTI.  Repeat lactate after fluids is normal.  Ceftriaxone was already ordered by pharmacy per code sepsis protocol.   The patient would like to go home.  Given that the patient's lactate has normalized, his vital signs are normal, and he overall appears well and is tolerating p.o., I think that he is reasonable for discharge with oral antibiotics for outpatient treatment of UTI.  I discussed the results of the work-up with the patient.  Return precautions given, and he expresses understanding.  _________________________________  Sean [name]. was evaluated in Emergency Department on 11/12/2019 for the symptoms described in the history of present illness. He was evaluated in the context of the global COVID-19 pandemic, which necessitated consideration that the patient might be at risk for infection with the SARS-CoV-2 virus that causes COVID-19. Institutional protocols and algorithms that pertain to the evaluation of patients at risk for COVID-19 are in a state of rapid change based on information released by regulatory bodies including the CDC and federal and state organizations. These policies and algorithms were followed during the patient's care in the ED. ____________________________________________   FINAL CLINICAL IMPRESSION(S) / ED DIAGNOSES  Final diagnoses:  Urinary tract infection without hematuria, site unspecified      NEW MEDICATIONS STARTED DURING THIS VISIT:  New Prescriptions   CEPHALEXIN (KEFLEX) 500 MG CAPSULE    Take 1 capsule (500 mg total) by mouth 2 (two) times daily for 10 days.     Note:  This document was prepared using Dragon voice recognition software and may  include unintentional dictation errors.    Arta Silence, MD 11/12/19 [phone]

## 2019-11-11 NOTE — ED Triage Notes (Signed)
Pt report chills and shaking that started about ten hours ago. Pt denies any other symptoms.  Pt aox4. NAD noted.

## 2019-11-12 DIAGNOSIS — R509 Fever, unspecified: Secondary | ICD-10-CM | POA: Diagnosis not present

## 2019-11-12 LAB — LACTIC ACID, PLASMA: Lactic Acid, Venous: 1.3 mmol/L (ref 0.5–1.9)

## 2019-11-12 LAB — URINALYSIS, COMPLETE (UACMP) WITH MICROSCOPIC
Bilirubin Urine: NEGATIVE
Glucose, UA: NEGATIVE mg/dL
Ketones, ur: NEGATIVE mg/dL
Nitrite: POSITIVE — AB
Protein, ur: NEGATIVE mg/dL
Specific Gravity, Urine: 1.016 (ref 1.005–1.030)
WBC, UA: >50 WBC/hpf — ABNORMAL HIGH (ref 0–5)
pH: 5 (ref 5.0–8.0)

## 2019-11-12 LAB — RESPIRATORY PANEL BY RT PCR (FLU A&B, COVID)
Influenza A by PCR: NEGATIVE
Influenza B by PCR: NEGATIVE
SARS Coronavirus 2 by RT PCR: NEGATIVE

## 2019-11-12 MED ORDER — CEPHALEXIN 500 MG PO CAPS: 500.0000 mg | ORAL_CAPSULE | Freq: Two times a day (BID) | ORAL | 0 refills | Status: AC

## 2019-11-12 MED ORDER — SODIUM CHLORIDE 0.9 % IV SOLN: 1.0000 g | Freq: Once | INTRAVENOUS | Status: DC

## 2019-11-12 MED ORDER — SODIUM CHLORIDE 0.9 % IV SOLN
2.0000 g | INTRAVENOUS | Status: AC
  Administered 2019-11-12: 2 g via INTRAVENOUS
  Filled 2019-11-12 (×2): qty 20

## 2019-11-12 NOTE — Discharge Instructions (Signed)
Take the antibiotic as prescribed and finish the full 10-day course.  You can continue to take Tylenol or ibuprofen as needed for fever.  Return to the ER for new, worsening, or persistent fevers, weakness, vomiting, flank or back pain, or any other new or worsening symptoms that concern you.

## 2019-11-12 NOTE — Telephone Encounter (Signed)
The patient is scheduled for 01/20/20 with Dr. Caryl Comes.

## 2019-11-14 LAB — URINE CULTURE: Culture: >=100000 — AB

## 2019-11-16 LAB — CULTURE, BLOOD (ROUTINE X 2): Culture: NO GROWTH

## 2019-11-17 LAB — CULTURE, BLOOD (ROUTINE X 2): Culture: NO GROWTH

## 2019-11-27 ENCOUNTER — Other Ambulatory Visit: Payer: Self-pay | Admitting: Internal Medicine

## 2019-12-01 ENCOUNTER — Other Ambulatory Visit: Payer: Self-pay

## 2019-12-06 NOTE — Progress Notes (Signed)
PPM remote 

## 2019-12-10 ENCOUNTER — Other Ambulatory Visit: Payer: Self-pay | Admitting: Cardiovascular Disease

## 2019-12-15 ENCOUNTER — Ambulatory Visit (INDEPENDENT_AMBULATORY_CARE_PROVIDER_SITE_OTHER): Payer: PPO

## 2019-12-15 ENCOUNTER — Other Ambulatory Visit: Payer: Self-pay

## 2019-12-15 DIAGNOSIS — I4892 Unspecified atrial flutter: Secondary | ICD-10-CM | POA: Diagnosis not present

## 2019-12-15 DIAGNOSIS — Z5181 Encounter for therapeutic drug level monitoring: Secondary | ICD-10-CM | POA: Diagnosis not present

## 2019-12-15 LAB — POCT INR: INR: 2.3 (ref 2.0–3.0)

## 2019-12-15 NOTE — Patient Instructions (Signed)
Please continue dosage of 1 tablet of warfarin every day EXCEPT 1/2 TABLET on [place]. Recheck in 6 weeks.

## 2019-12-17 ENCOUNTER — Ambulatory Visit: Payer: PPO

## 2020-01-07 ENCOUNTER — Ambulatory Visit: Payer: PPO

## 2020-01-08 ENCOUNTER — Other Ambulatory Visit: Payer: Self-pay | Admitting: Cardiovascular Disease

## 2020-01-20 ENCOUNTER — Encounter: Payer: PPO | Admitting: Internal Medicine

## 2020-01-21 ENCOUNTER — Encounter: Payer: Self-pay | Admitting: Internal Medicine

## 2020-01-26 ENCOUNTER — Other Ambulatory Visit: Payer: Self-pay

## 2020-01-26 ENCOUNTER — Ambulatory Visit (INDEPENDENT_AMBULATORY_CARE_PROVIDER_SITE_OTHER): Payer: PPO

## 2020-01-26 DIAGNOSIS — Z5181 Encounter for therapeutic drug level monitoring: Secondary | ICD-10-CM | POA: Diagnosis not present

## 2020-01-26 DIAGNOSIS — I4892 Unspecified atrial flutter: Secondary | ICD-10-CM

## 2020-01-26 LAB — POCT INR: INR: 2.4 (ref 2.0–3.0)

## 2020-01-26 NOTE — Patient Instructions (Signed)
Please continue warfarin dosage of 1 tablet of warfarin every day EXCEPT 1/2 TABLET on [place]. Recheck in 6 weeks.

## 2020-01-28 ENCOUNTER — Ambulatory Visit (INDEPENDENT_AMBULATORY_CARE_PROVIDER_SITE_OTHER): Payer: PPO | Admitting: *Deleted

## 2020-01-28 DIAGNOSIS — Z950 Presence of cardiac pacemaker: Secondary | ICD-10-CM | POA: Diagnosis not present

## 2020-01-28 LAB — CUP PACEART REMOTE DEVICE CHECK
Battery Remaining Longevity: 74 mo
Battery Voltage: 2.97 V
Brady Statistic AP VP Percent: 97.33 %
Brady Statistic AP VS Percent: 0.03 %
Brady Statistic AS VP Percent: 13.45 %
Brady Statistic AS VS Percent: 0 %
Brady Statistic RA Percent Paced: 0.12 %
Brady Statistic RV Percent Paced: 99.98 %
Date Time Interrogation Session: 20210309233508
Implantable Lead Implant Date: 20180723
Implantable Lead Implant Date: 20180723
Implantable Lead Location: 753859
Implantable Lead Location: 753860
Implantable Lead Model: 3830
Implantable Lead Model: 5076
Implantable Pulse Generator Implant Date: 20180723
Lead Channel Impedance Value: 304 Ohm
Lead Channel Impedance Value: 342 Ohm
Lead Channel Impedance Value: 380 Ohm
Lead Channel Impedance Value: 513 Ohm
Lead Channel Pacing Threshold Amplitude: 0.5 V
Lead Channel Pacing Threshold Amplitude: 1.25 V
Lead Channel Pacing Threshold Pulse Width: 0.4 ms
Lead Channel Pacing Threshold Pulse Width: 0.4 ms
Lead Channel Sensing Intrinsic Amplitude: 1.75 mV
Lead Channel Sensing Intrinsic Amplitude: 1.75 mV
Lead Channel Sensing Intrinsic Amplitude: 7.625 mV
Lead Channel Sensing Intrinsic Amplitude: 9.25 mV
Lead Channel Setting Pacing Amplitude: 2 V
Lead Channel Setting Pacing Amplitude: 2.5 V
Lead Channel Setting Pacing Pulse Width: 1 ms
Lead Channel Setting Sensing Sensitivity: 2.8 mV

## 2020-01-29 NOTE — Progress Notes (Signed)
PPM Remote  

## 2020-02-02 ENCOUNTER — Telehealth: Payer: Self-pay | Admitting: Internal Medicine

## 2020-02-02 NOTE — Telephone Encounter (Signed)
Patient calling in to speak with nurse about an appointment related to his pacemaker. Patient was told about upcoming appointments but stated he needs to speak with Sean [name] because it was a prior appointment she is aware of  Please advise

## 2020-02-03 NOTE — Telephone Encounter (Signed)
I spoke with the patient. He states he received a letter from [facility] about a $50 no show fee for his appt on 01/20/20 with Dr. Caryl Comes. The patient states he was not aware of this appointment being made.  I advised him we spoke back in December, and he remembered the day (the 15th). He did not want to pursue an ablation due to COVID at that time. We discussed seeing him in 2-3 months and he was agreeable. I sent a message to scheduling to please arrange for the appt. The 01/20/20 appt was scheduled on 12/21, but I cannot confirm nor deny that scheduling spoke with him directly.   I advised the patient I will forward a message to my manager to please address this.  Staff message sent to Shanda Howells today.   The patient did want to schedule another appt with Dr. Caryl Comes to discuss ablation or coming off warfarin. I advised her can't come off warfarin if he is going to get ablated. The patient has been scheduled to see Dr. Caryl Comes on 02/10/20 at [place]. He is aware and voices understanding.

## 2020-02-04 ENCOUNTER — Telehealth: Payer: Self-pay | Admitting: *Deleted

## 2020-02-04 NOTE — Telephone Encounter (Signed)
I have called the patient and notified him of [name]'s response below.  He voices understanding and was appreciative for the call back.

## 2020-02-04 NOTE — Telephone Encounter (Signed)
-----   Message from Jeannette How sent at 02/04/2020  7:59 AM EDT ----- Nira Conn,  Per billing he was not billed for the no show.  Thanks,  ----- Message ----- From: Delma Officer Sent: 02/04/2020   7:46 AM EDT To: Onalee Hua Morning Izora Gala,  This gentleman did not get billed for a no show.    Suanne Marker  ----- Message ----- From: Jeannette How Sent: 02/04/2020   7:28 AM EDT To: [facility] Billing  Since I am unable to confirm that the patient was called, can we please remove the no show charge.  Thanks, ----- Message ----- From: Emily Filbert, RN Sent: 02/03/2020   4:08 PM EDT To: Epifania Gore  I had this patient call today stating he received a letter from [place] that he will be charged $50 for a no show fee. He missed his appt on 01/20/20 with Dr. Caryl Comes. I had spoken with him back in December, he needed an ablation, but wanted to wait a little due to [place].  I advised him we could see him in 2-3 months per Dr. Caryl Comes and forwarded a message to scheduling on 12/15 to please reach out to him to schedule.  The appt for 3/2 was made on 12/21 by Lovena Le, but I cannot confirm no deny that she spoke with him about it.  He typically writes everything down, and said he never received a call about the appointment.   I told him I  would just need to forward this to you as I was not sure what to do about this situation.   Thanks!

## 2020-02-09 NOTE — H&P (View-Only) (Signed)
Patient Care Team: [name], MD as PCP - General (Family Medicine)   HPI  Sean [name]. is a 79 y.o. male Seen in followup for His pacing 99991111 complicated lead induced PVCs initially at >9 % .No  EF assessment prior to device implantation   7/19 Cath >> 3V CAD Post cath he developed a left upper extremity DVT and was started on Eliquis >> CABG 9/19 with placing of a LV epicardial lead   11/19  near syncopal episode Imaging demonstrated a large hemomothorax attributed per the family to a slow leak following his bypass surgery >>  transferred to [facility] and underwent VATS for drainage and decortication.  worsening LV function As above -- started on entresto, stopped because of cost  When I saw him 11/19 he was also complaining of pain along his tracked pacemaker lead.  I reached out to Dr. PVT who saw him and then felt that surgical revision was in order.  Accomplished 12/19  1/20 found to have persistent Aflutter > 72 hrs and referred to AFib clinic. ECG  Aflutter A -CL-250 msec , started on apixoban because of cost, he would like to be transitioned to warfarin no bleeding-- plan had been to do flutter ablation but held off in fall 2020 2/2 concerns re covid      He is here to discuss catheter ablation of his atrial flutter which has been persistent.  It has been delayed initially because of Covid.  He denies bleeding.  He notes no untoward from any of his medications.  Minimal shortness of breath without chest pain shortness of breath or edema.    DATE TEST EF   9/15 Myoview 55-65% No ischemia  10/18 Echo   50-55 %   5/19 Echo   30-35 %   7/19 CTA  Prob 3 V CAD  7/19 LHC  3V CAD  12/19 Echo  20-25%   3/20 Echo  25-30%    11/20 Echo  35-40%     Date Cr K Hgb  1/20 1.49 5.0 12.0              Past Medical History:  Diagnosis Date  . Arthritis   . Asthma   . CAD (coronary artery disease)    a. LHC 7/19: ostLM 30%, ostLAD 30%, p-mLAD 99% mod calcified,  ostD1 90%, ost-pLCx 80% ulcerative & mod calcified, p-mLCx 50%, OM2 50%, mod dz LPDA, RCA small w/ diffuse dz throughout; b. recommendation of CABG; c. 3-V CABG 08/26/18 (LIMA-LAD, VG-Diag, VG-LCx)  . Chronic systolic CHF (congestive heart failure) (HCC)    a. EF 35-40%, diffuse HK with HK of the apical, anteroseptal, and anterior myocardium, Gr1DD, mild MR, mildly dilated LA, pacer wire noted in the RV with nl RVSF, PASP nl  . CKD (chronic kidney disease), stage III   . Deep vein thrombosis (DVT) of upper extremity ([place] [place])    a. DVT of left subclavian dx'd 06/17/2018; b. Eliquis  . GERD (gastroesophageal reflux disease)    takes otc occasionally  . History of complete heart block    a. s/p MDT PPM 05/2017  . Hypertension   . Obesity   . Presence of permanent cardiac pacemaker    Dr. Beckie Salts implanted 05/2017  . PVC's (premature ventricular contractions)    a. PPM-induced    Past Surgical History:  Procedure Laterality Date  . BACK SURGERY    . CORONARY ARTERY BYPASS GRAFT N/A 08/26/2018   Procedure: CORONARY  ARTERY BYPASS GRAFTING (CABG) x3 , Using left internal mammory artery and right leg greater saphronious vein. Harvested endoscopically.;  Surgeon: Ivin Poot, MD;  Location: [place];  Service: Open Heart Surgery;  Laterality: N/A;  . EPICARDIAL PACING LEAD PLACEMENT N/A 08/26/2018   Procedure: LV PACING LEAD PLACEMENT;  Surgeon: Ivin Poot, MD;  Location: [place];  Service: Thoracic;  Laterality: N/A;  . EPICARDIAL PACING LEAD PLACEMENT Left 11/07/2018   Procedure: REVISE PACEMAKER LEAD LEFT CHEST;  Surgeon: Ivin Poot, MD;  Location: [place];  Service: Thoracic;  Laterality: Left;  . INSERT / REPLACE / REMOVE PACEMAKER     [employer]  . LEFT HEART CATH AND CORONARY ANGIOGRAPHY N/A 06/13/2018   Procedure: LEFT HEART CATH AND CORONARY ANGIOGRAPHY;  Surgeon: Wellington Hampshire, MD;  Location: [place] CV LAB;  Service: Cardiovascular;  Laterality: N/A;  . LEG SURGERY    .  PACEMAKER IMPLANT N/A 06/11/2017   Procedure: Pacemaker Implant;  Surgeon: Evans Lance, MD;  Location: [place] CV LAB;  Service: Cardiovascular;  Laterality: N/A;  . PLEURAL EFFUSION DRAINAGE Left 09/24/2018   Procedure: DRAINAGE OF LOCULATED PLEURAL EFFUSION;  Surgeon: Ivin Poot, MD;  Location: [place];  Service: Thoracic;  Laterality: Left;  . TEE WITHOUT CARDIOVERSION N/A 08/26/2018   Procedure: TRANSESOPHAGEAL ECHOCARDIOGRAM (TEE);  Surgeon: Prescott Gum, Collier Salina, MD;  Location: [place];  Service: Open Heart Surgery;  Laterality: N/A;  . VIDEO ASSISTED THORACOSCOPY Left 09/24/2018   Procedure: VIDEO ASSISTED THORACOSCOPY;  Surgeon: Prescott Gum, Collier Salina, MD;  Location: [facility] OR;  Service: Thoracic;  Laterality: Left;    Current Outpatient Medications  Medication Sig Dispense Refill  . acetaminophen (TYLENOL) 500 MG tablet Take 500 mg by mouth every 6 (six) hours as needed for moderate pain or headache.    . albuterol (PROVENTIL HFA;VENTOLIN HFA) 108 (90 Base) MCG/ACT inhaler Inhale 2 puffs into the lungs every 6 (six) hours as needed for wheezing or shortness of breath.     . allopurinol (ZYLOPRIM) 100 MG tablet Take 100 mg by mouth every other day.     Marland Kitchen atorvastatin (LIPITOR) 40 MG tablet TAKE ONE TABLET EVERY DAY 30 tablet 0  . Fluticasone-Salmeterol (ADVAIR) 100-50 MCG/DOSE AEPB Inhale 1 puff into the lungs daily as needed (shortness of breath). 60 each   . losartan (COZAAR) 50 MG tablet Take 1 tablet (50 mg total) by mouth daily. 90 tablet 3  . metoprolol succinate (TOPROL-XL) 25 MG 24 hr tablet Take 1 tablet (25 mg total) by mouth daily. 90 tablet 2  . Specialty Vitamins Products (PROSTATE PO) Take 1 tablet by mouth every other day.     . spironolactone (ALDACTONE) 25 MG tablet TAKE 1/2 TABLET EVERYDAY 15 tablet 1  . warfarin (COUMADIN) 5 MG tablet Take 1 tablet (5 mg) by mouth once daily in the evening as directed by the Coumadin Clinic 30 tablet 3   No current facility-administered  medications for this visit.    Allergies  Allergen Reactions  . Azithromycin Hives  . Erythromycin Hives      Review of Systems negative except from HPI and PMH  Physical Exam BP 138/78 (BP Location: Left Arm, Patient Position: Sitting, Cuff Size: Normal)   Pulse 81   Ht 5' 10.5" (1.791 m)   Wt 229 lb 8 oz (104.1 kg)   SpO2 98%   BMI 32.46 kg/m  Well developed and well nourished in no acute distress HENT normal Neck supple with JVP-flat  Clear Device pocket well healed; without hematoma or erythema.  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  Atrial flutter is persistent.  We have talked again about catheter ablation reviewing the risks and the benefits.  He looks forward to getting off the anticoagulation.  He is aware that there is some risk of atrial fibrillation.  We will look at available plates  He is euvolemic.  Device function is normal.  We spent more than 50% of our >25 min visit in face to face counseling regarding the above

## 2020-02-09 NOTE — Progress Notes (Signed)
Patient Care Team: [name], MD as PCP - General (Family Medicine)   HPI  Sean [name]. is a 79 y.o. male Seen in followup for His pacing [id number] complicated lead induced PVCs initially at >9 % .No  EF assessment prior to device implantation   7/19 Cath >> 3V CAD Post cath he developed a left upper extremity DVT and was started on Eliquis >> CABG 9/19 with placing of a LV epicardial lead   11/19  near syncopal episode Imaging demonstrated a large hemomothorax attributed per the family to a slow leak following his bypass surgery >>  transferred to [facility] and underwent VATS for drainage and decortication.  worsening LV function As above -- started on entresto, stopped because of cost  When I saw him 11/19 he was also complaining of pain along his tracked pacemaker lead.  I reached out to Dr. PVT who saw him and then felt that surgical revision was in order.  Accomplished 12/19  1/20 found to have persistent Aflutter > 72 hrs and referred to AFib clinic. ECG  Aflutter A -CL-250 msec , started on apixoban because of cost, he would like to be transitioned to warfarin no bleeding-- plan had been to do flutter ablation but held off in fall 2020 2/2 concerns re covid      He is here to discuss catheter ablation of his atrial flutter which has been persistent.  It has been delayed initially because of Covid.  He denies bleeding.  He notes no untoward from any of his medications.  Minimal shortness of breath without chest pain shortness of breath or edema.    DATE TEST EF   9/15 Myoview 55-65% No ischemia  10/18 Echo   50-55 %   5/19 Echo   30-35 %   7/19 CTA  Prob 3 V CAD  7/19 LHC  3V CAD  12/19 Echo  20-25%   3/20 Echo  25-30%    11/20 Echo  35-40%     Date Cr K Hgb  1/20 1.49 5.0 12.0              Past Medical History:  Diagnosis Date  . Arthritis   . Asthma   . CAD (coronary artery disease)    a. LHC 7/19: ostLM 30%, ostLAD 30%, p-mLAD 99% mod calcified,  ostD1 90%, ost-pLCx 80% ulcerative & mod calcified, p-mLCx 50%, OM2 50%, mod dz LPDA, RCA small w/ diffuse dz throughout; b. recommendation of CABG; c. 3-V CABG 08/26/18 (LIMA-LAD, VG-Diag, VG-LCx)  . Chronic systolic CHF (congestive heart failure) (HCC)    a. EF 35-40%, diffuse HK with HK of the apical, anteroseptal, and anterior myocardium, Gr1DD, mild MR, mildly dilated LA, pacer wire noted in the RV with nl RVSF, PASP nl  . CKD (chronic kidney disease), stage III   . Deep vein thrombosis (DVT) of upper extremity ([place])    a. DVT of left subclavian dx'd 06/17/2018; b. Eliquis  . GERD (gastroesophageal reflux disease)    takes otc occasionally  . History of complete heart block    a. s/p MDT PPM 05/2017  . Hypertension   . Obesity   . Presence of permanent cardiac pacemaker    Dr. Beckie Salts implanted 05/2017  . PVC's (premature ventricular contractions)    a. PPM-induced    Past Surgical History:  Procedure Laterality Date  . BACK SURGERY    . CORONARY ARTERY BYPASS GRAFT N/A 08/26/2018   Procedure: CORONARY  ARTERY BYPASS GRAFTING (CABG) x3 , Using left internal mammory artery and right leg greater saphronious vein. Harvested endoscopically.;  Surgeon: Ivin Poot, MD;  Location: [place];  Service: Open Heart Surgery;  Laterality: N/A;  . EPICARDIAL PACING LEAD PLACEMENT N/A 08/26/2018   Procedure: LV PACING LEAD PLACEMENT;  Surgeon: Ivin Poot, MD;  Location: [place];  Service: Thoracic;  Laterality: N/A;  . EPICARDIAL PACING LEAD PLACEMENT Left 11/07/2018   Procedure: REVISE PACEMAKER LEAD LEFT CHEST;  Surgeon: Ivin Poot, MD;  Location: [place];  Service: Thoracic;  Laterality: Left;  . INSERT / REPLACE / REMOVE PACEMAKER     [employer]ON[employer]  . LEFT HEART CATH AND CORONARY ANGIOGRAPHY N/A 06/13/2018   Procedure: LEFT HEART CATH AND CORONARY ANGIOGRAPHY;  Surgeon: Wellington Hampshire, MD;  Location: [place] CV LAB;  Service: Cardiovascular;  Laterality: N/A;  . LEG SURGERY    .  PACEMAKER IMPLANT N/A 06/11/2017   Procedure: Pacemaker Implant;  Surgeon: Evans Lance, MD;  Location: [place] CV LAB;  Service: Cardiovascular;  Laterality: N/A;  . PLEURAL EFFUSION DRAINAGE Left 09/24/2018   Procedure: DRAINAGE OF LOCULATED PLEURAL EFFUSION;  Surgeon: Ivin Poot, MD;  Location: [place];  Service: Thoracic;  Laterality: Left;  . TEE WITHOUT CARDIOVERSION N/A 08/26/2018   Procedure: TRANSESOPHAGEAL ECHOCARDIOGRAM (TEE);  Surgeon: Prescott Gum, Collier Salina, MD;  Location: [place];  Service: Open Heart Surgery;  Laterality: N/A;  . VIDEO ASSISTED THORACOSCOPY Left 09/24/2018   Procedure: VIDEO ASSISTED THORACOSCOPY;  Surgeon: Prescott Gum, Collier Salina, MD;  Location: [facility] OR;  Service: Thoracic;  Laterality: Left;    Current Outpatient Medications  Medication Sig Dispense Refill  . acetaminophen (TYLENOL) 500 MG tablet Take 500 mg by mouth every 6 (six) hours as needed for moderate pain or headache.    . albuterol (PROVENTIL HFA;VENTOLIN HFA) 108 (90 Base) MCG/ACT inhaler Inhale 2 puffs into the lungs every 6 (six) hours as needed for wheezing or shortness of breath.     . allopurinol (ZYLOPRIM) 100 MG tablet Take 100 mg by mouth every other day.     Marland Kitchen atorvastatin (LIPITOR) 40 MG tablet TAKE ONE TABLET EVERY DAY 30 tablet 0  . Fluticasone-Salmeterol (ADVAIR) 100-50 MCG/DOSE AEPB Inhale 1 puff into the lungs daily as needed (shortness of breath). 60 each   . losartan (COZAAR) 50 MG tablet Take 1 tablet (50 mg total) by mouth daily. 90 tablet 3  . metoprolol succinate (TOPROL-XL) 25 MG 24 hr tablet Take 1 tablet (25 mg total) by mouth daily. 90 tablet 2  . Specialty Vitamins Products (PROSTATE PO) Take 1 tablet by mouth every other day.     . spironolactone (ALDACTONE) 25 MG tablet TAKE 1/2 TABLET EVERYDAY 15 tablet 1  . warfarin (COUMADIN) 5 MG tablet Take 1 tablet (5 mg) by mouth once daily in the evening as directed by the Coumadin Clinic 30 tablet 3   No current facility-administered  medications for this visit.    Allergies  Allergen Reactions  . Azithromycin Hives  . Erythromycin Hives      Review of Systems negative except from HPI and PMH  Physical Exam BP 138/78 (BP Location: Left Arm, Patient Position: Sitting, Cuff Size: Normal)   Pulse 81   Ht 5' 10.5" (1.791 m)   Wt 229 lb 8 oz (104.1 kg)   SpO2 98%   BMI 32.46 kg/m  Well developed and well nourished in no acute distress HENT normal Neck supple with JVP-flat  Clear Device pocket well healed; without hematoma or erythema.  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  Atrial flutter is persistent.  We have talked again about catheter ablation reviewing the risks and the benefits.  He looks forward to getting off the anticoagulation.  He is aware that there is some risk of atrial fibrillation.  We will look at available plates  He is euvolemic.  Device function is normal.  We spent more than 50% of our >25 min visit in face to face counseling regarding the above

## 2020-02-10 ENCOUNTER — Telehealth: Payer: Self-pay | Admitting: Internal Medicine

## 2020-02-10 ENCOUNTER — Ambulatory Visit (INDEPENDENT_AMBULATORY_CARE_PROVIDER_SITE_OTHER): Payer: PPO | Admitting: Internal Medicine

## 2020-02-10 ENCOUNTER — Other Ambulatory Visit: Payer: Self-pay

## 2020-02-10 ENCOUNTER — Encounter: Payer: Self-pay | Admitting: Internal Medicine

## 2020-02-10 VITALS — BP 138/78 | HR 81 | Ht 70.5 in | Wt 229.5 lb

## 2020-02-10 DIAGNOSIS — I4892 Unspecified atrial flutter: Secondary | ICD-10-CM

## 2020-02-10 DIAGNOSIS — I493 Ventricular premature depolarization: Secondary | ICD-10-CM | POA: Diagnosis not present

## 2020-02-10 DIAGNOSIS — I10 Essential (primary) hypertension: Secondary | ICD-10-CM

## 2020-02-10 DIAGNOSIS — I442 Atrioventricular block, complete: Secondary | ICD-10-CM

## 2020-02-10 DIAGNOSIS — Z01812 Encounter for preprocedural laboratory examination: Secondary | ICD-10-CM

## 2020-02-10 DIAGNOSIS — Z950 Presence of cardiac pacemaker: Secondary | ICD-10-CM | POA: Diagnosis not present

## 2020-02-10 LAB — CUP PACEART INCLINIC DEVICE CHECK
Battery Remaining Longevity: 72 mo
Battery Voltage: 2.97 V
Brady Statistic RA Percent Paced: 0.11 %
Brady Statistic RV Percent Paced: 99.98 %
Date Time Interrogation Session: 20210323093700
Implantable Lead Implant Date: 20180723
Implantable Lead Implant Date: 20180723
Implantable Lead Location: 753859
Implantable Lead Location: 753860
Implantable Lead Model: 3830
Implantable Lead Model: 5076
Implantable Pulse Generator Implant Date: 20180723
Lead Channel Impedance Value: 304 Ohm
Lead Channel Impedance Value: 323 Ohm
Lead Channel Impedance Value: 380 Ohm
Lead Channel Impedance Value: 494 Ohm
Lead Channel Pacing Threshold Amplitude: 0.75 V
Lead Channel Pacing Threshold Pulse Width: 1 ms
Lead Channel Sensing Intrinsic Amplitude: 3.5 mV
Lead Channel Setting Pacing Amplitude: 2 V
Lead Channel Setting Pacing Amplitude: 2.5 V
Lead Channel Setting Pacing Pulse Width: 1 ms
Lead Channel Setting Sensing Sensitivity: 2.8 mV

## 2020-02-10 NOTE — Patient Instructions (Addendum)
Medication Instructions:  - Your physician recommends that you continue on your current medications as directed. Please refer to the Current Medication list given to you today.  *If you need a refill on your cardiac medications before your next appointment, please call your pharmacy*   Lab Work: - pending procedure date  If you have labs (blood work) drawn today and your tests are completely normal, you will receive your results only by: Marland Kitchen MyChart Message (if you have MyChart) OR . A paper copy in the mail If you have any lab test that is abnormal or we need to change your treatment, we will call you to review the results.   Testing/Procedures: - Your physician has recommended that you have an Atrial Flutter Ablation. Catheter ablation is a medical procedure used to treat some cardiac arrhythmias (irregular heartbeats). During catheter ablation, a long, thin, flexible tube is put into a blood vessel in your groin (upper thigh), or neck. This tube is called an ablation catheter. It is then guided to your heart through the blood vessel. Radio frequency waves destroy small areas of heart tissue where abnormal heartbeats may cause an arrhythmia to start.    Possible dates are: - Friday 02/20/20 - Wednesday 03/03/20    Follow-Up: At [facility], you and your health needs are our priority.  As part of our continuing mission to provide you with exceptional heart care, we have created designated Provider Care Teams.  These Care Teams include your primary Cardiologist (physician) and Advanced Practice Providers (APPs -  Physician Assistants and Nurse Practitioners) who all work together to provide you with the care you need, when you need it.  We recommend signing up for the patient portal called "MyChart".  Sign up information is provided on this After Visit Summary.  MyChart is used to connect with patients for Virtual Visits (Telemedicine).  Patients are able to view lab/test results, encounter  notes, upcoming appointments, etc.  Non-urgent messages can be sent to your provider as well.   To learn more about what you can do with MyChart, go to [email or web address].    Your next appointment:   Pending procedure date   The format for your next appointment:   In Person  Provider:   Virl Axe, MD   Other Instructions n/a

## 2020-02-10 NOTE — Telephone Encounter (Signed)
Patient calling in regarding dates offered by nurse. Patient would like Wednesday 03/03/20 but needs to know what time to arrive  Please advise

## 2020-02-11 ENCOUNTER — Other Ambulatory Visit: Payer: Self-pay | Admitting: Internal Medicine

## 2020-02-11 NOTE — Telephone Encounter (Signed)
This is a [facility] pt 

## 2020-02-12 ENCOUNTER — Other Ambulatory Visit: Payer: Self-pay | Admitting: Internal Medicine

## 2020-02-12 ENCOUNTER — Encounter: Payer: Self-pay | Admitting: *Deleted

## 2020-02-12 NOTE — Telephone Encounter (Signed)
I have scheduled the patient for his atrial flutter ablation on Wednesday 03/03/20 at 10:30 am.  I called and spoke with the patient and his wife about his pre-procedure instructions. I have also mailed a copy of these as well. The patient and his wife voice understanding.

## 2020-02-12 NOTE — Telephone Encounter (Signed)
Patient wants to discuss details please call .

## 2020-02-12 NOTE — Telephone Encounter (Signed)
This is a [facility] pt 

## 2020-02-20 ENCOUNTER — Other Ambulatory Visit: Payer: Self-pay | Admitting: Internal Medicine

## 2020-02-23 ENCOUNTER — Telehealth: Payer: Self-pay | Admitting: Internal Medicine

## 2020-02-23 NOTE — Telephone Encounter (Signed)
Pt c/o medication issue:  1. Name of Medication: allopurinol   2. How are you currently taking this medication (dosage and times per day)? 100 mg po q d   3. Are you having a reaction (difficulty breathing--STAT)? No   4. What is your medication issue?  Pharmacy calling because they received this rx and then today received a new rx for 100 mg po BID from Dr. Kary Kos   Please call to discuss this and interaction with warfarin

## 2020-02-23 NOTE — Telephone Encounter (Signed)
Spoke with patient. He is unsure what the issue is. I confirmed that allopurinol is not new and dose has not increased. He confirms that he takes 1 tab every other day. We have managed patient- drug interaction being managed. Spoke with pharmacy and advised ok to fill as pt has been on for a long time and dose is not changing.

## 2020-03-01 ENCOUNTER — Other Ambulatory Visit: Payer: Self-pay

## 2020-03-01 ENCOUNTER — Other Ambulatory Visit
Admission: RE | Admit: 2020-03-01 | Discharge: 2020-03-01 | Disposition: A | Discharge status: Home / Self Care | Payer: PPO | Source: Ambulatory Visit | Attending: Internal Medicine | Admitting: Internal Medicine

## 2020-03-01 ENCOUNTER — Other Ambulatory Visit
Admission: RE | Admit: 2020-03-01 | Discharge: 2020-03-01 | Disposition: A | Discharge status: Home / Self Care | Payer: PPO | Source: Home / Self Care | Attending: Internal Medicine | Admitting: Internal Medicine

## 2020-03-01 DIAGNOSIS — Z01812 Encounter for preprocedural laboratory examination: Secondary | ICD-10-CM | POA: Diagnosis not present

## 2020-03-01 DIAGNOSIS — I4892 Unspecified atrial flutter: Secondary | ICD-10-CM | POA: Insufficient documentation

## 2020-03-01 DIAGNOSIS — Z20822 Contact with and (suspected) exposure to covid-19: Secondary | ICD-10-CM | POA: Diagnosis not present

## 2020-03-01 LAB — PROTIME-INR
INR: 1.6 — ABNORMAL HIGH (ref 0.8–1.2)
Prothrombin Time: 19.3 seconds — ABNORMAL HIGH (ref 11.4–15.2)

## 2020-03-01 LAB — BASIC METABOLIC PANEL
Anion gap: 9 (ref 5–15)
BUN: 37 mg/dL — ABNORMAL HIGH (ref 8–23)
CO2: 24 mmol/L (ref 22–32)
Calcium: 9 mg/dL (ref 8.9–10.3)
Chloride: 105 mmol/L (ref 98–111)
Creatinine, Ser: 1.59 mg/dL — ABNORMAL HIGH (ref 0.61–1.24)
GFR calc Af Amer: 47 mL/min — ABNORMAL LOW (ref >60)
GFR calc non Af Amer: 41 mL/min — ABNORMAL LOW (ref >60)
Glucose, Bld: 184 mg/dL — ABNORMAL HIGH (ref 70–99)
Potassium: 4.2 mmol/L (ref 3.5–5.1)
Sodium: 138 mmol/L (ref 135–145)

## 2020-03-01 LAB — CBC WITH DIFFERENTIAL/PLATELET
Abs Immature Granulocytes: 0.03 10*3/uL (ref 0.00–0.07)
Basophils Absolute: 0 10*3/uL (ref 0.0–0.1)
Basophils Relative: 0 %
Eosinophils Absolute: 0.4 10*3/uL (ref 0.0–0.5)
Eosinophils Relative: 6 %
HCT: 41.2 % (ref 39.0–52.0)
Hemoglobin: 13.2 g/dL (ref 13.0–17.0)
Immature Granulocytes: 0 %
Lymphocytes Relative: 24 %
Lymphs Abs: 1.6 10*3/uL (ref 0.7–4.0)
MCH: 31.4 pg (ref 26.0–34.0)
MCHC: 32 g/dL (ref 30.0–36.0)
MCV: 98.1 fL (ref 80.0–100.0)
Monocytes Absolute: 0.5 10*3/uL (ref 0.1–1.0)
Monocytes Relative: 7 %
Neutro Abs: 4.2 10*3/uL (ref 1.7–7.7)
Neutrophils Relative %: 63 %
Platelets: 166 10*3/uL (ref 150–400)
RBC: 4.2 MIL/uL — ABNORMAL LOW (ref 4.22–5.81)
RDW: 12.5 % (ref 11.5–15.5)
WBC: 6.7 10*3/uL (ref 4.0–10.5)
nRBC: 0 % (ref 0.0–0.2)

## 2020-03-01 LAB — SARS CORONAVIRUS 2 (TAT 6-24 HRS): SARS Coronavirus 2: NEGATIVE

## 2020-03-02 ENCOUNTER — Ambulatory Visit (INDEPENDENT_AMBULATORY_CARE_PROVIDER_SITE_OTHER): Payer: PPO | Admitting: *Deleted

## 2020-03-02 ENCOUNTER — Encounter: Payer: Self-pay | Admitting: *Deleted

## 2020-03-02 VITALS — HR 82 | Resp 16 | Ht 70.0 in | Wt 227.2 lb

## 2020-03-02 DIAGNOSIS — I4892 Unspecified atrial flutter: Secondary | ICD-10-CM

## 2020-03-02 NOTE — Patient Instructions (Signed)
Medication Instructions:  - Your physician has recommended you make the following change in your medication:   1) Hold warfarin tonight  2) Start eliquis 5 mg: - take 2 tablets (10 mg) by mouth tonight (in place of your warfarin) - take 1 tablet ( 5 mg) in the morning (prior to coming in for your procedure)  Samples given: Eliquis 5 mg Lot: [id number] Exp: 11/2021  *If you need a refill on your cardiac medications before your next appointment, please call your pharmacy*   Lab Work: - none ordered  If you have labs (blood work) drawn today and your tests are completely normal, you will receive your results only by: Marland Kitchen MyChart Message (if you have MyChart) OR . A paper copy in the mail If you have any lab test that is abnormal or we need to change your treatment, we will call you to review the results.   Testing/Procedures: - Your physician has requested that you have a TEE prior to your ablation tomorrow. During a TEE, sound waves are used to create images of your heart. It provides your doctor with information about the size and shape of your heart and how well your heart's chambers and valves are working. In this test, a transducer is attached to the end of a flexible tube that's guided down your throat and into your esophagus (the tube leading from you mouth to your stomach) to get a more detailed image of your heart. You are not awake for the procedure.    Follow-Up: At [facility], you and your health needs are our priority.  As part of our continuing mission to provide you with exceptional heart care, we have created designated Provider Care Teams.  These Care Teams include your primary Cardiologist (physician) and Advanced Practice Providers (APPs -  Physician Assistants and Nurse Practitioners) who all work together to provide you with the care you need, when you need it.  We recommend signing up for the patient portal called "MyChart".  Sign up information is provided on this  After Visit Summary.  MyChart is used to connect with patients for Virtual Visits (Telemedicine).  Patients are able to view lab/test results, encounter notes, upcoming appointments, etc.  Non-urgent messages can be sent to your provider as well.   To learn more about what you can do with MyChart, go to [email or web address].    Your next appointment:   As ascheduled   The format for your next appointment:   In Person  Provider:   Virl Axe, MD   Other Instructions N/a   Transesophageal Echocardiogram  Transesophageal echocardiogram (TEE) is a test that uses sound waves (echocardiogram) to produce very clear, detailed images of the heart. TEE is done by passing a flexible tube down the esophagus. The heart is located in front of the esophagus. TEE may be done:  To check how well your heart valves are working.  To check for any abnormal growth or tumor  To look for blood clots  To check for infection of the lining of the heart (endocarditis).  To evaluate the dividing wall (septum) of the heart and check for a hole that did not close after birth (patent foramen ovale or atrial septal defect).  To help diagnose a tear in the wall of the blood vessels (aortic dissection).  To look at the heart shape, size, and function. Any changes can be associated with certain conditions, including heart failure, aneurysm, and coronary heart disease, CAD.  During cardiac valve surgery.  This allows the surgeon to assess the valve repair before closing the chest.  During a variety of other cardiac procedures to guide positioning of catheters.  To monitor your heart's response to IV fluids or medicine. TEE is usually not a painful procedure. You may feel the probe press against the back of the throat. The probe does not enter the trachea and does not affect your breathing. Tell a health care provider about:  Any allergies you have.  All medicines you are taking, including vitamins,  herbs, eye drops, creams, and over-the-counter medicines.  Any problems you or family members have had with anesthetic medicines.  Any blood disorders you have.  Any surgeries you have had.  Any medical conditions you have.  Any swallowing difficulties.  Whether you have or have had a blockage of the esophagus (esophageal obstruction).  Whether you are pregnant or may be pregnant. What are the risks? Generally, this is a safe procedure. However, problems may occur, including:  Damage to other structures or organs.  A tear of the esophagus (esophageal rupture).  Irregular heart beat (arrhythmia).  Hoarse voice or difficulty swallowing.  Bleeding (hemorrhage). What happens before the procedure? Staying hydrated Follow instructions from your health care provider about hydration, which may include:  Up to 3 hours before the procedure - you may continue to drink clear liquids, such as water, clear fruit juice, black coffee, and plain tea. Eating and drinking Follow instructions from your health care provider about eating and drinking, which may include:  8 hours before the procedure - stop eating heavy meals or foods such as meat, fried foods, or fatty foods.  6 hours before the procedure - stop eating light meals or foods, such as toast or cereal.  6 hours before the procedure - stop drinking milk or drinks that contain milk.  3 hours before the procedure - stop drinking clear liquids. General instructions  You will need to remove any dentures or dental retainers.  Plan to have someone take you home from the hospital or clinic.  Plan to have a responsible adult care for you for at least 24 hours after you leave the hospital or clinic. This is important.  Ask your health care provider about: ? Changing or stopping your normal medicines. This is important if you take diabetes medicines or blood thinners. ? Taking over-the-counter medicines, vitamins, herbs, and  supplements. ? Taking medicines such as aspirin and ibuprofen. These medicines can thin your blood. Do not take these medicines unless your health care provider tells you to take them. What happens during the procedure?  To reduce your risk of infection: ? Your health care team will wash or sanitize their hands. ? Hair may be removed from the surgical area. ? Your skin will be washed with soap.  An IV will be inserted into one of your veins.  You will be given one or both of the following: ? A medicine to help you relax (sedative). ? A medicine to be sprayed or gargled in order to numb the back of your throat (local anesthetic).  Your blood pressure, heart rate, and breathing (vital signs) will be monitored during the procedure.  You may be asked to lay on your left side.  A bite block will be placed in your mouth to keep you from biting the tube during the procedure.  The tip of the TEE probe will be placed into the back of your mouth. You will be asked to swallow. This helps to  pass the tip of the probe into the esophagus.  Once the tip of the probe is in the correct area, your health care provider will take pictures of the heart.  The probe and bite block will be removed when the procedure is done. The procedure may vary among health care providers and hospitals What happens after the procedure?  Your blood pressure, heart rate, breathing rate, and blood oxygen level will be monitored until the medicines you were given have worn off.  When you first wake up, your throat may feel slightly sore and will probably still feel numb. This will improve slowly over time. You will not be allowed to eat or drink until the numbness has gone away.  Do not drive for 24 hours if you received a sedative. Summary  Transesophageal echocardiogram (TEE) is a test that uses sound waves (echocardiogram) to produce very clear, detailed images of the heart.  TEE is done by passing a flexible tube  down the esophagus.  Generally, this is a safe procedure. However, problems may occur, including damage to other structures or organs, bleeding, irregular heart beat, and a hoarse voice or trouble swallowing.  Tell your health care provider about any medicines and medical conditions you may have, as some conditions may increase your risk of complications. This information is not intended to replace advice given to you by your health care provider. Make sure you discuss any questions you have with your health care provider. Document Revised: 04/17/2017 Document Reviewed: 02/02/2017 Elsevier Patient Education  [place].

## 2020-03-02 NOTE — Progress Notes (Signed)
1.) Reason for visit: EKG  2.) Name of MD requesting visit: Caryl Comes  3.) H&P: The patient has a history of Atrial flutter and is scheduled for an atrial flutter ablation. He had his pre-procedure labs done yesterday and I reviewed these this afternoon. His INR had previously been therapeutic, but his draw yesterday showed his INR to be 1.6. I reviewed with Dr. Caryl Comes and he advised the patient needed to come in today for an EKG to confirm his rhythm. The patient states he missed his dose of warfarin on Sunday night (02/29/20).   4.) ROS related to problem: The patient is currently asymptomatic with his rhythm and without complaints today. EKG performed and reviewed remotely with Dr. Caryl Comes.   5.) Assessment and plan per MD: Per Dr. Caryl Comes, EKG today confirms the patient is currently in atrial flutter. Per Dr. Caryl Comes, the patient will need to have a TEE done prior to his atrial flutter ablation. He has called [employer] consultant) to arrange. The patient will need to hold his warfarin tonight and take eliquis 10 mg tonight and 5 mg in the morning. He should report to [facility] at 7:30 am tomorrow. The patient has been notified of the above recommendations and is agreeable.

## 2020-03-03 ENCOUNTER — Ambulatory Visit (HOSPITAL_COMMUNITY): Payer: PPO | Admitting: Anesthesiology

## 2020-03-03 ENCOUNTER — Ambulatory Visit (HOSPITAL_COMMUNITY)
Admission: RE | Admit: 2020-03-03 | Discharge: 2020-03-03 | Disposition: A | Discharge status: Home / Self Care | Payer: PPO | Attending: Internal Medicine | Admitting: Internal Medicine

## 2020-03-03 ENCOUNTER — Telehealth: Payer: Self-pay | Admitting: Internal Medicine

## 2020-03-03 ENCOUNTER — Ambulatory Visit (HOSPITAL_BASED_OUTPATIENT_CLINIC_OR_DEPARTMENT_OTHER): Payer: PPO

## 2020-03-03 ENCOUNTER — Encounter (HOSPITAL_COMMUNITY)
Admission: RE | Disposition: A | Discharge status: Home / Self Care | Payer: Self-pay | Source: Home / Self Care | Attending: Internal Medicine

## 2020-03-03 ENCOUNTER — Other Ambulatory Visit: Payer: Self-pay

## 2020-03-03 DIAGNOSIS — Z7951 Long term (current) use of inhaled steroids: Secondary | ICD-10-CM | POA: Diagnosis not present

## 2020-03-03 DIAGNOSIS — I340 Nonrheumatic mitral (valve) insufficiency: Secondary | ICD-10-CM

## 2020-03-03 DIAGNOSIS — I4892 Unspecified atrial flutter: Secondary | ICD-10-CM | POA: Insufficient documentation

## 2020-03-03 DIAGNOSIS — I5022 Chronic systolic (congestive) heart failure: Secondary | ICD-10-CM | POA: Insufficient documentation

## 2020-03-03 DIAGNOSIS — Z79899 Other long term (current) drug therapy: Secondary | ICD-10-CM | POA: Diagnosis not present

## 2020-03-03 DIAGNOSIS — Z86718 Personal history of other venous thrombosis and embolism: Secondary | ICD-10-CM | POA: Diagnosis not present

## 2020-03-03 DIAGNOSIS — K219 Gastro-esophageal reflux disease without esophagitis: Secondary | ICD-10-CM | POA: Insufficient documentation

## 2020-03-03 DIAGNOSIS — Z881 Allergy status to other antibiotic agents status: Secondary | ICD-10-CM | POA: Insufficient documentation

## 2020-03-03 DIAGNOSIS — E669 Obesity, unspecified: Secondary | ICD-10-CM | POA: Diagnosis not present

## 2020-03-03 DIAGNOSIS — I442 Atrioventricular block, complete: Secondary | ICD-10-CM | POA: Insufficient documentation

## 2020-03-03 DIAGNOSIS — Z950 Presence of cardiac pacemaker: Secondary | ICD-10-CM | POA: Diagnosis not present

## 2020-03-03 DIAGNOSIS — Z7901 Long term (current) use of anticoagulants: Secondary | ICD-10-CM | POA: Diagnosis not present

## 2020-03-03 DIAGNOSIS — J45909 Unspecified asthma, uncomplicated: Secondary | ICD-10-CM | POA: Diagnosis not present

## 2020-03-03 DIAGNOSIS — N183 Chronic kidney disease, stage 3 unspecified: Secondary | ICD-10-CM | POA: Diagnosis not present

## 2020-03-03 DIAGNOSIS — Z6832 Body mass index (BMI) 32.0-32.9, adult: Secondary | ICD-10-CM | POA: Insufficient documentation

## 2020-03-03 DIAGNOSIS — I255 Ischemic cardiomyopathy: Secondary | ICD-10-CM | POA: Insufficient documentation

## 2020-03-03 DIAGNOSIS — I493 Ventricular premature depolarization: Secondary | ICD-10-CM | POA: Diagnosis not present

## 2020-03-03 DIAGNOSIS — Z951 Presence of aortocoronary bypass graft: Secondary | ICD-10-CM | POA: Diagnosis not present

## 2020-03-03 DIAGNOSIS — I2510 Atherosclerotic heart disease of native coronary artery without angina pectoris: Secondary | ICD-10-CM | POA: Diagnosis not present

## 2020-03-03 DIAGNOSIS — I130 Hypertensive heart and chronic kidney disease with heart failure and stage 1 through stage 4 chronic kidney disease, or unspecified chronic kidney disease: Secondary | ICD-10-CM | POA: Diagnosis not present

## 2020-03-03 DIAGNOSIS — M109 Gout, unspecified: Secondary | ICD-10-CM | POA: Diagnosis not present

## 2020-03-03 HISTORY — PX: A-FLUTTER ABLATION: EP1230

## 2020-03-03 HISTORY — PX: TEE WITHOUT CARDIOVERSION: SHX5443

## 2020-03-03 LAB — PROTIME-INR
INR: 2.2 — ABNORMAL HIGH (ref 0.8–1.2)
Prothrombin Time: 24.7 seconds — ABNORMAL HIGH (ref 11.4–15.2)

## 2020-03-03 SURGERY — A-FLUTTER ABLATION
Anesthesia: General

## 2020-03-03 MED ORDER — PROMETHAZINE HCL 25 MG/ML IJ SOLN: 6.2500 mg | INTRAMUSCULAR | Status: DC | PRN

## 2020-03-03 MED ORDER — BUPIVACAINE HCL (PF) 0.25 % IJ SOLN
INTRAMUSCULAR | Status: DC | PRN
  Administered 2020-03-03: 20 mL

## 2020-03-03 MED ORDER — METOPROLOL SUCCINATE ER 25 MG PO TB24
25.0000 mg | ORAL_TABLET | Freq: Once | ORAL | Status: AC
  Administered 2020-03-03: 25 mg via ORAL
  Filled 2020-03-03: qty 1

## 2020-03-03 MED ORDER — SODIUM CHLORIDE 0.9% FLUSH: 3.0000 mL | INTRAVENOUS | Status: DC | PRN

## 2020-03-03 MED ORDER — APIXABAN 5 MG PO TABS: 5.0000 mg | ORAL_TABLET | Freq: Two times a day (BID) | ORAL | 0 refills | Status: DC

## 2020-03-03 MED ORDER — SUGAMMADEX SODIUM 200 MG/2ML IV SOLN
INTRAVENOUS | Status: DC | PRN
  Administered 2020-03-03: 400 mg via INTRAVENOUS

## 2020-03-03 MED ORDER — LIDOCAINE 2% (20 MG/ML) 5 ML SYRINGE
INTRAMUSCULAR | Status: DC | PRN
  Administered 2020-03-03: 30 mg via INTRAVENOUS
  Administered 2020-03-03: 20 mg via INTRAVENOUS

## 2020-03-03 MED ORDER — SODIUM CHLORIDE 0.9% FLUSH: 3.0000 mL | Freq: Two times a day (BID) | INTRAVENOUS | Status: DC

## 2020-03-03 MED ORDER — ONDANSETRON HCL 4 MG/2ML IJ SOLN
INTRAMUSCULAR | Status: DC | PRN
  Administered 2020-03-03: 4 mg via INTRAVENOUS

## 2020-03-03 MED ORDER — ACETAMINOPHEN 325 MG PO TABS
650.0000 mg | ORAL_TABLET | ORAL | Status: DC | PRN
  Filled 2020-03-03: qty 2

## 2020-03-03 MED ORDER — OXYCODONE HCL 5 MG PO TABS: 5.0000 mg | ORAL_TABLET | Freq: Once | ORAL | Status: DC | PRN

## 2020-03-03 MED ORDER — PROPOFOL 500 MG/50ML IV EMUL
INTRAVENOUS | Status: DC | PRN
  Administered 2020-03-03: 100 ug/kg/min via INTRAVENOUS

## 2020-03-03 MED ORDER — OXYCODONE HCL 5 MG/5ML PO SOLN: 5.0000 mg | Freq: Once | ORAL | Status: DC | PRN

## 2020-03-03 MED ORDER — SODIUM CHLORIDE 0.9 % IV SOLN: 250.0000 mL | INTRAVENOUS | Status: DC | PRN

## 2020-03-03 MED ORDER — HYDROMORPHONE HCL 1 MG/ML IJ SOLN: 0.2500 mg | INTRAMUSCULAR | Status: DC | PRN

## 2020-03-03 MED ORDER — SODIUM CHLORIDE 0.9 % IV SOLN: INTRAVENOUS | Status: DC

## 2020-03-03 MED ORDER — LOSARTAN POTASSIUM 50 MG PO TABS
50.0000 mg | ORAL_TABLET | Freq: Once | ORAL | Status: AC
  Administered 2020-03-03: 50 mg via ORAL
  Filled 2020-03-03: qty 1

## 2020-03-03 MED ORDER — PROPOFOL 10 MG/ML IV BOLUS
INTRAVENOUS | Status: DC | PRN
  Administered 2020-03-03: 150 mg via INTRAVENOUS

## 2020-03-03 MED ORDER — BUPIVACAINE HCL (PF) 0.25 % IJ SOLN
INTRAMUSCULAR | Status: AC
  Filled 2020-03-03: qty 30

## 2020-03-03 MED ORDER — SODIUM CHLORIDE 0.9 % IV SOLN: INTRAVENOUS | Status: DC | PRN

## 2020-03-03 MED ORDER — ONDANSETRON HCL 4 MG/2ML IJ SOLN: 4.0000 mg | Freq: Four times a day (QID) | INTRAMUSCULAR | Status: DC | PRN

## 2020-03-03 MED ORDER — PHENYLEPHRINE HCL-NACL 10-0.9 MG/250ML-% IV SOLN
INTRAVENOUS | Status: DC | PRN
  Administered 2020-03-03: 25 ug/min via INTRAVENOUS

## 2020-03-03 MED ORDER — HEPARIN (PORCINE) IN NACL 1000-0.9 UT/500ML-% IV SOLN
INTRAVENOUS | Status: DC | PRN
  Administered 2020-03-03: 500 mL

## 2020-03-03 MED ORDER — ROCURONIUM BROMIDE 50 MG/5ML IV SOSY
PREFILLED_SYRINGE | INTRAVENOUS | Status: DC | PRN
  Administered 2020-03-03: 100 mg via INTRAVENOUS

## 2020-03-03 SURGICAL SUPPLY — 11 items
CATH BLAZERPRIME XP LG CV 10MM (ABLATOR) ×1 IMPLANT
CATH DUODECA HALO/ISMUS 7FR (CATHETERS) ×1 IMPLANT
CATH OCTAPOLOR 6F 125CM 2-5-2 (CATHETERS) ×1 IMPLANT
CATH QUAD COURNAND 5FR REPROC (CATHETERS) ×1 IMPLANT
PACK EP LATEX FREE (CUSTOM PROCEDURE TRAY) ×3
PACK EP LF (CUSTOM PROCEDURE TRAY) ×2 IMPLANT
PAD PRO RADIOLUCENT 2001M-C (PAD) ×3 IMPLANT
SHEATH ATRIAL FLUTTER SAFL 8F (SHEATH) ×1 IMPLANT
SHEATH PINNACLE 6F 10CM (SHEATH) ×1 IMPLANT
SHEATH PINNACLE 7F 10CM (SHEATH) ×1 IMPLANT
SHEATH PINNACLE 8F 10CM (SHEATH) ×1 IMPLANT

## 2020-03-03 NOTE — Progress Notes (Signed)
Site area: left groin fv sheath x1 Site Prior to Removal:  Level 0 Pressure Applied For: 15 minutes Manual:   yes Patient Status During Pull:  stable Post Pull Site:  Level 0 Post Pull Instructions Given:  yes Post Pull Pulses Present: left dp palpable Dressing Applied:  Gauze and tegaderm Bedrest begins @ 1320 Comments: IV saline locked

## 2020-03-03 NOTE — Anesthesia Postprocedure Evaluation (Signed)
Anesthesia Post Note  Patient: Sean [name].  Procedure(s) Performed: A-FLUTTER ABLATION (N/A ) Transesophageal Echocardiogram (Tee)     Patient location during evaluation: PACU Anesthesia Type: General Level of consciousness: awake and alert Pain management: pain level controlled Vital Signs Assessment: post-procedure vital signs reviewed and stable Respiratory status: spontaneous breathing, nonlabored ventilation and respiratory function stable Cardiovascular status: blood pressure returned to baseline and stable Postop Assessment: no apparent nausea or vomiting Anesthetic complications: no    Last Vitals:  Vitals:   03/03/20 1353 03/03/20 1355  BP:  (!) 164/94  Pulse:  69  Resp:  (!) 8  Temp: 36.8 C   SpO2:  100%    Last Pain:  Vitals:   03/03/20 1353  TempSrc: Temporal  PainSc:                  Lynda Rainwater

## 2020-03-03 NOTE — Progress Notes (Signed)
Site area: rt groin fv sheaths x2 Site Prior to Removal:  Level 0 Pressure Applied For: 15 minutes Manual:   yes Patient Status During Pull:  stable Post Pull Site:  Level 0 Post Pull Instructions Given:  yes Post Pull Pulses Present: rt dp palpable Dressing Applied:  Gauze and tegaderm Bedrest begins @  Comments:

## 2020-03-03 NOTE — Transfer of Care (Signed)
Immediate Anesthesia Transfer of Care Note  Patient: Sean [name].  Procedure(s) Performed: A-FLUTTER ABLATION (N/A ) Transesophageal Echocardiogram Darden Dates)  Patient Location: PACU  Anesthesia Type:General  Level of Consciousness: awake, alert  and oriented 32 Airway & Oxygen Therapy: Patient Spontanous Breathing and Patient connected to nasal cannula oxygen  Post-op Assessment: Report given to RN and Post -op Vital signs reviewed and stable  Post vital signs: Reviewed and stable  Last Vitals:  Vitals Value Taken Time  BP 165/86 03/03/20 1247  Temp 36.8 C 03/03/20 1242  Pulse 69 03/03/20 1251  Resp 14 03/03/20 1251  SpO2 100 % 03/03/20 1251  Vitals shown include unvalidated device data.  Last Pain:  Vitals:   03/03/20 1242  TempSrc: Temporal  PainSc: 0-No pain      Patients Stated Pain Goal: 5 ([id number])  Complications: No apparent anesthesia complications

## 2020-03-03 NOTE — Telephone Encounter (Signed)
Call received from Dr. Caryl Comes. The patient is s/p atrial flutter ablation today. He would like the patient to stop warfarin and have eliquis 5 mg BID samples provided if available. He was given 1 box in clinic yesterday and will need 3 additional boxes (total 4 weeks).  Dr. Caryl Comes has notified the patient's wife I will pull these and they may come pick these up in the office.  Samples given: Eliquis 5 mg  Lot: [id number] Exp: 12/2021 # 3 boxes  Placed at our front desk.   I have also cancelled the patient's coumadin appointment scheduled for 03/08/20. Will forward to [place], Therapist, sports in [place] as an [place].

## 2020-03-03 NOTE — Discharge Instructions (Signed)
Post procedure care instructions No driving for 4 days. No lifting over 5 lbs for 1 week. No vigorous or sexual activity for 1 week. You may return to work/your usual activities on 03/10/2020. Keep procedure site clean & dry. If you notice increased pain, swelling, bleeding or pus, call/return!  You may shower, but no soaking baths/hot tubs/pools for 1 week.      Cardiac Ablation, Care After This sheet gives you information about how to care for yourself after your procedure. Your health care provider may also give you more specific instructions. If you have problems or questions, contact your health care provider. What can I expect after the procedure? After the procedure, it is common to have:  Bruising around your puncture site.  Tenderness around your puncture site.  Skipped heartbeats.  Tiredness (fatigue). Follow these instructions at home: Puncture site care   Follow instructions from your health care provider about how to take care of your puncture site. Make sure you: ? Wash your hands with soap and water before you change your bandage (dressing). If soap and water are not available, use hand sanitizer. ? Change your dressing as told by your health care provider. ? Leave stitches (sutures), skin glue, or adhesive strips in place. These skin closures may need to stay in place for up to 2 weeks. If adhesive strip edges start to loosen and curl up, you may trim the loose edges. Do not remove adhesive strips completely unless your health care provider tells you to do that.  Check your puncture site every day for signs of infection. Check for: ? Redness, swelling, or pain. ? Fluid or blood. If your puncture site starts to bleed, lie down on your back, apply firm pressure to the area, and contact your health care provider. ? Warmth. ? Pus or a bad smell. Driving  Ask your health care provider when it is safe for you to drive again after the procedure.  Do not drive or use heavy  machinery while taking prescription pain medicine.  Do not drive for 24 hours if you were given a medicine to help you relax (sedative) during your procedure. Activity  Avoid activities that take a lot of effort for at least 3 days after your procedure.  Do not lift anything that is heavier than 10 lb (4.5 kg), or the limit that you are told, until your health care provider says that it is safe.  Return to your normal activities as told by your health care provider. Ask your health care provider what activities are safe for you. General instructions  Take over-the-counter and prescription medicines only as told by your health care provider.  Do not use any products that contain nicotine or tobacco, such as cigarettes and e-cigarettes. If you need help quitting, ask your health care provider.  Do not take baths, swim, or use a hot tub until your health care provider approves.  Do not drink alcohol for 24 hours after your procedure.  Keep all follow-up visits as told by your health care provider. This is important. Contact a health care provider if:  You have redness, mild swelling, or pain around your puncture site.  You have fluid or blood coming from your puncture site that stops after applying firm pressure to the area.  Your puncture site feels warm to the touch.  You have pus or a bad smell coming from your puncture site.  You have a fever.  You have chest pain or discomfort that spreads  to your neck, jaw, or arm.  You are sweating a lot.  You feel nauseous.  You have a fast or irregular heartbeat.  You have shortness of breath.  You are dizzy or light-headed and feel the need to lie down.  You have pain or numbness in the arm or leg closest to your puncture site. Get help right away if:  Your puncture site suddenly swells.  Your puncture site is bleeding and the bleeding does not stop after applying firm pressure to the area. These symptoms may represent a  serious problem that is an emergency. Do not wait to see if the symptoms will go away. Get medical help right away. Call your local emergency services (911 in the [place] Do not drive yourself to the hospital. Summary  After the procedure, it is normal to have bruising and tenderness at the puncture site in your groin, neck, or forearm.  Check your puncture site every day for signs of infection.  Get help right away if your puncture site is bleeding and the bleeding does not stop after applying firm pressure to the area. This is a medical emergency. This information is not intended to replace advice given to you by your health care provider. Make sure you discuss any questions you have with your health care provider. Document Revised: 10/19/2017 Document Reviewed: 02/15/2017 Elsevier Patient Education  [place].

## 2020-03-03 NOTE — Anesthesia Preprocedure Evaluation (Signed)
Anesthesia Evaluation  Patient identified by MRN, date of birth, ID band Patient awake    Reviewed: Allergy & Precautions, NPO status , Patient's Chart, lab work & pertinent test results, reviewed documented beta blocker date and time   History of Anesthesia Complications Negative for: history of anesthetic complications  Airway Mallampati: II  TM Distance: >3 FB Neck ROM: Full    Dental  (+) Teeth Intact, Dental Advisory Given,    Pulmonary shortness of breath, asthma ,    breath sounds clear to auscultation       Cardiovascular hypertension, Pt. on medications and Pt. on home beta blockers (-) angina+ CAD, + CABG and +CHF  + dysrhythmias + pacemaker  Rhythm:Regular     Neuro/Psych negative neurological ROS  negative psych ROS   GI/Hepatic Neg liver ROS, GERD  ,  Endo/Other  Morbid obesity  Renal/GU CRFRenal disease     Musculoskeletal  (+) Arthritis ,   Abdominal (+) + obese,   Peds  Hematology  (+) anemia ,   Anesthesia Other Findings   Reproductive/Obstetrics                                                              Anesthesia Evaluation  Patient identified by MRN, date of birth, ID band Patient awake    Reviewed: Allergy & Precautions, NPO status , Patient's Chart, lab work & pertinent test results, reviewed documented beta blocker date and time   Airway Mallampati: II  TM Distance: >3 FB Neck ROM: Full    Dental  (+) Teeth Intact, Dental Advisory Given   Pulmonary asthma ,    Pulmonary exam normal breath sounds clear to auscultation       Cardiovascular hypertension, Pt. on home beta blockers (-) angina+ CAD, + Past MI, + CABG and +CHF  Normal cardiovascular exam+ dysrhythmias + pacemaker  Rhythm:Regular Rate:Normal     Neuro/Psych negative neurological ROS     GI/Hepatic Neg liver ROS, GERD  Medicated,  Endo/Other  negative endocrine  ROSObesity   Renal/GU Renal InsufficiencyRenal disease     Musculoskeletal negative musculoskeletal ROS (+)   Abdominal   Peds  Hematology  (+) Blood dyscrasia (Warfarin), ,   Anesthesia Other Findings Day of surgery medications reviewed with the patient.  Reproductive/Obstetrics                             Anesthesia Physical Anesthesia Plan  ASA: IV  Anesthesia Plan: General   Post-op Pain Management:    Induction: Intravenous  PONV Risk Score and Plan: 2 and Dexamethasone and Ondansetron  Airway Management Planned: Double Lumen EBT  Additional Equipment: Arterial line, CVP and Ultrasound Guidance Line Placement  Intra-op Plan:   Post-operative Plan: Extubation in OR and Possible Post-op intubation/ventilation  Informed Consent: I have reviewed the patients History and Physical, chart, labs and discussed the procedure including the risks, benefits and alternatives for the proposed anesthesia with the patient or authorized representative who has indicated his/her understanding and acceptance.   Dental advisory given  Plan Discussed with: CRNA  Anesthesia Plan Comments:         Anesthesia Quick Evaluation  Anesthesia Physical  Anesthesia Plan  ASA: III  Anesthesia Plan: General   Post-op  Pain Management:    Induction: Intravenous  PONV Risk Score and Plan: 2 and Ondansetron, Midazolam and Treatment may vary due to age or medical condition  Airway Management Planned: Oral ETT  Additional Equipment: None  Intra-op Plan:   Post-operative Plan: Extubation in OR  Informed Consent: I have reviewed the patients History and Physical, chart, labs and discussed the procedure including the risks, benefits and alternatives for the proposed anesthesia with the patient or authorized representative who has indicated his/her understanding and acceptance.     Dental advisory given and Dental Advisory Given  Plan Discussed with:  CRNA and Surgeon  Anesthesia Plan Comments: (  )        Anesthesia Quick Evaluation

## 2020-03-03 NOTE — Anesthesia Procedure Notes (Signed)
Procedure Name: Intubation Date/Time: 03/03/2020 11:21 AM Performed by: Eligha Bridegroom, CRNA Pre-anesthesia Checklist: Patient identified, Emergency Drugs available, Suction available, Patient being monitored and Timeout performed Patient Re-evaluated:Patient Re-evaluated prior to induction Oxygen Delivery Method: Circle system utilized Preoxygenation: Pre-oxygenation with 100% oxygen Induction Type: IV induction Ventilation: Mask ventilation with difficulty and Oral airway inserted - appropriate to patient size Laryngoscope Size: Mac and 4 Grade View: Grade II Tube type: Oral Placement Confirmation: ETT inserted through vocal cords under direct vision,  positive ETCO2 and breath sounds checked- equal and bilateral Secured at: 22 cm Tube secured with: Tape Dental Injury: Teeth and Oropharynx as per pre-operative assessment

## 2020-03-03 NOTE — Progress Notes (Signed)
  Echocardiogram Echocardiogram Transesophageal has been performed.  Sean [name] 03/03/2020, 11:46 AM

## 2020-03-03 NOTE — Progress Notes (Signed)
    Transesophageal Echocardiogram Note  Sean [name] [id number] 1941/05/08  Procedure: Transesophageal Echocardiogram Indications: Atrial flutter  Procedure Details Consent: Obtained Time Out: Verified patient identification, verified procedure, site/side was marked, verified correct patient position, special equipment/implants available, Radiology Safety Procedures followed,  medications/allergies/relevent history reviewed, required imaging and test results available.  Performed  Medications:  Pt sedated by anesthesia with diprovan as part of atrial flutter ablation.  Moderate to severe LV dysfunction; septal hypokinesis; moderate LAE; no LAA thrombus; mild RAE; pacer wire noted; mild MR and TR; trace AI.   Complications: No apparent complications Patient did tolerate procedure well.  Kirk Ruths, MD

## 2020-03-03 NOTE — Interval H&P Note (Signed)
History and Physical Interval Note:  03/03/2020 8:23 AM  Sean [name].  has presented today for surgery, with the diagnosis of Atrail flutter.  The various methods of treatment have been discussed with the patient and family. After consideration of risks, benefits and other options for treatment, the patient has consented to  Procedure(s): A-FLUTTER ABLATION (N/A) as a surgical intervention.  The patient's history has been reviewed, patient examined, no change in status, stable for surgery.  I have reviewed the patient's chart and labs.  Questions were answered to the patient's satisfaction.     Virl Axe  Subtherapeutic INR so switched to [place]-- for TEE this am

## 2020-03-04 NOTE — Progress Notes (Signed)
No voice mail set up yet

## 2020-03-04 NOTE — Addendum Note (Signed)
Addendum  created 03/04/20 1335 by Lynda Rainwater, MD   Intraprocedure Staff edited

## 2020-03-06 IMAGING — DX DG CHEST 2V
3 series · 3 of 3 positions shown · non-contrast
Comparison: 08/29/2018

CLINICAL DATA: History of coronary bypass grafting

EXAM:
CHEST - 2 VIEW

[chest lat (1 of 2)]
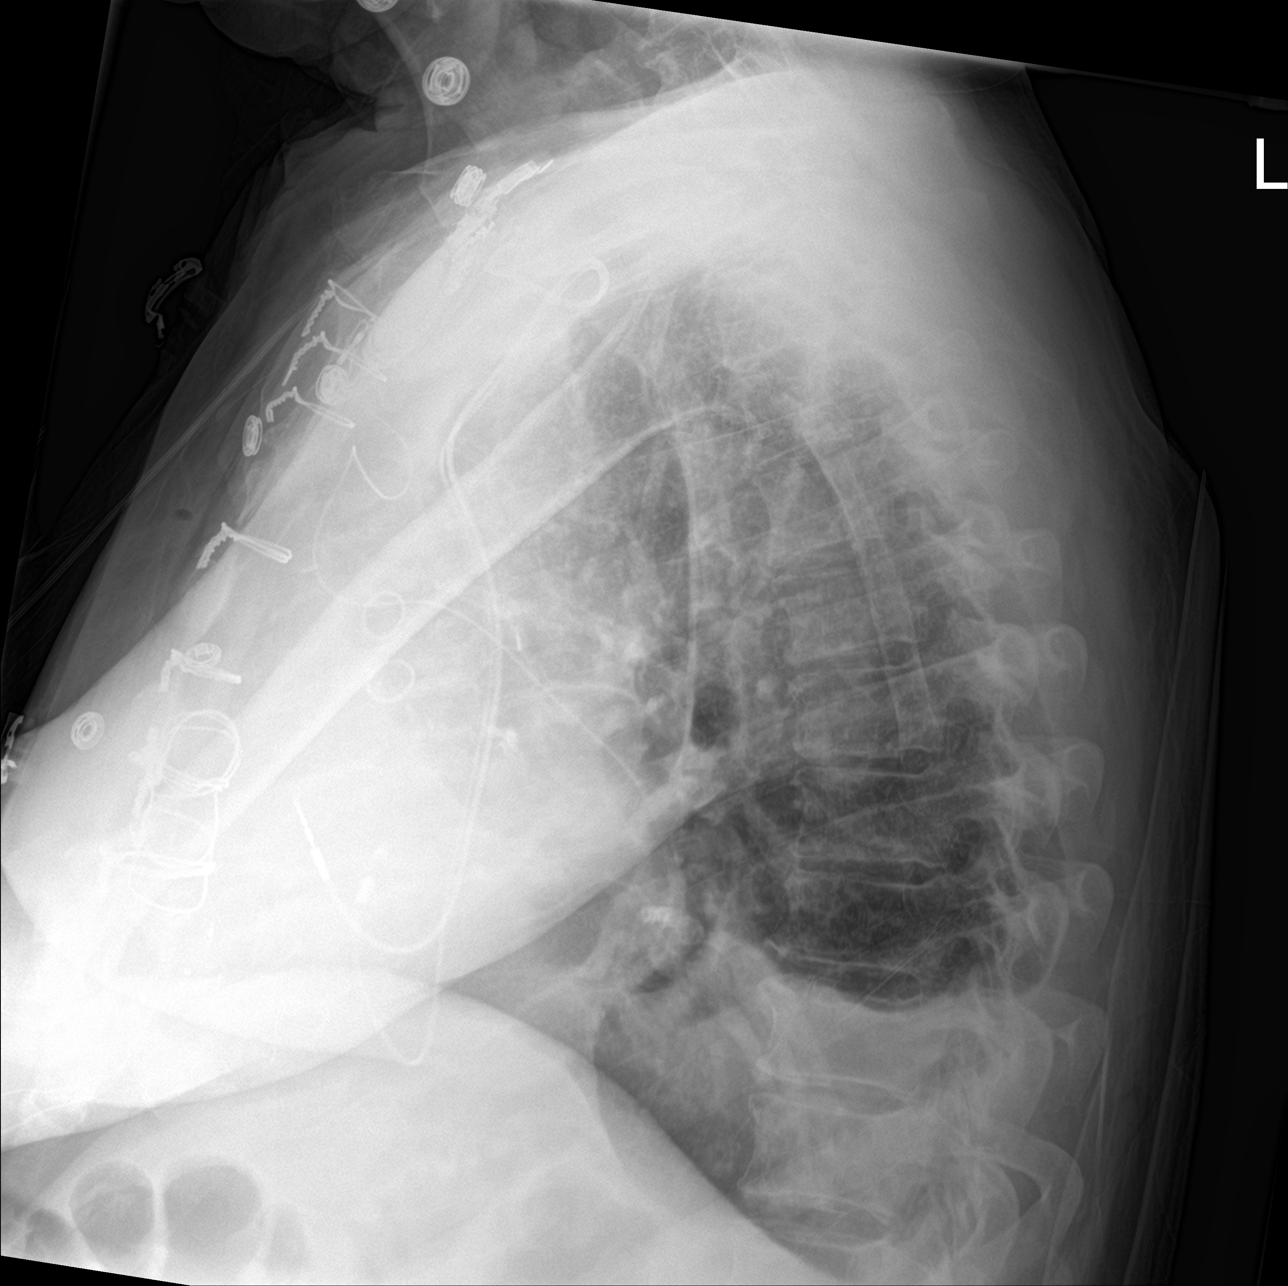

[chest ap]
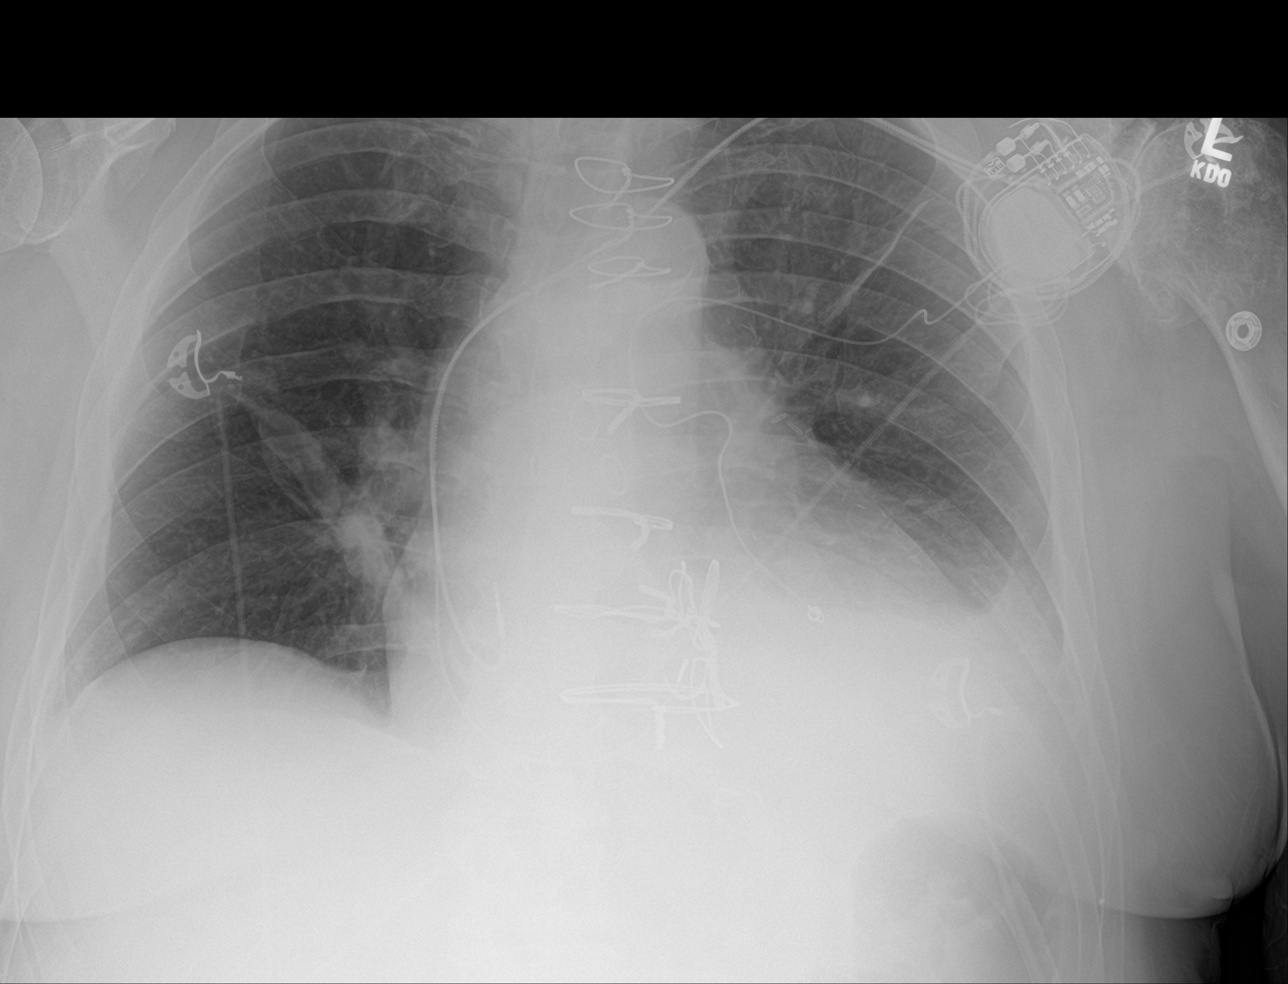

[chest lat (2 of 2)]
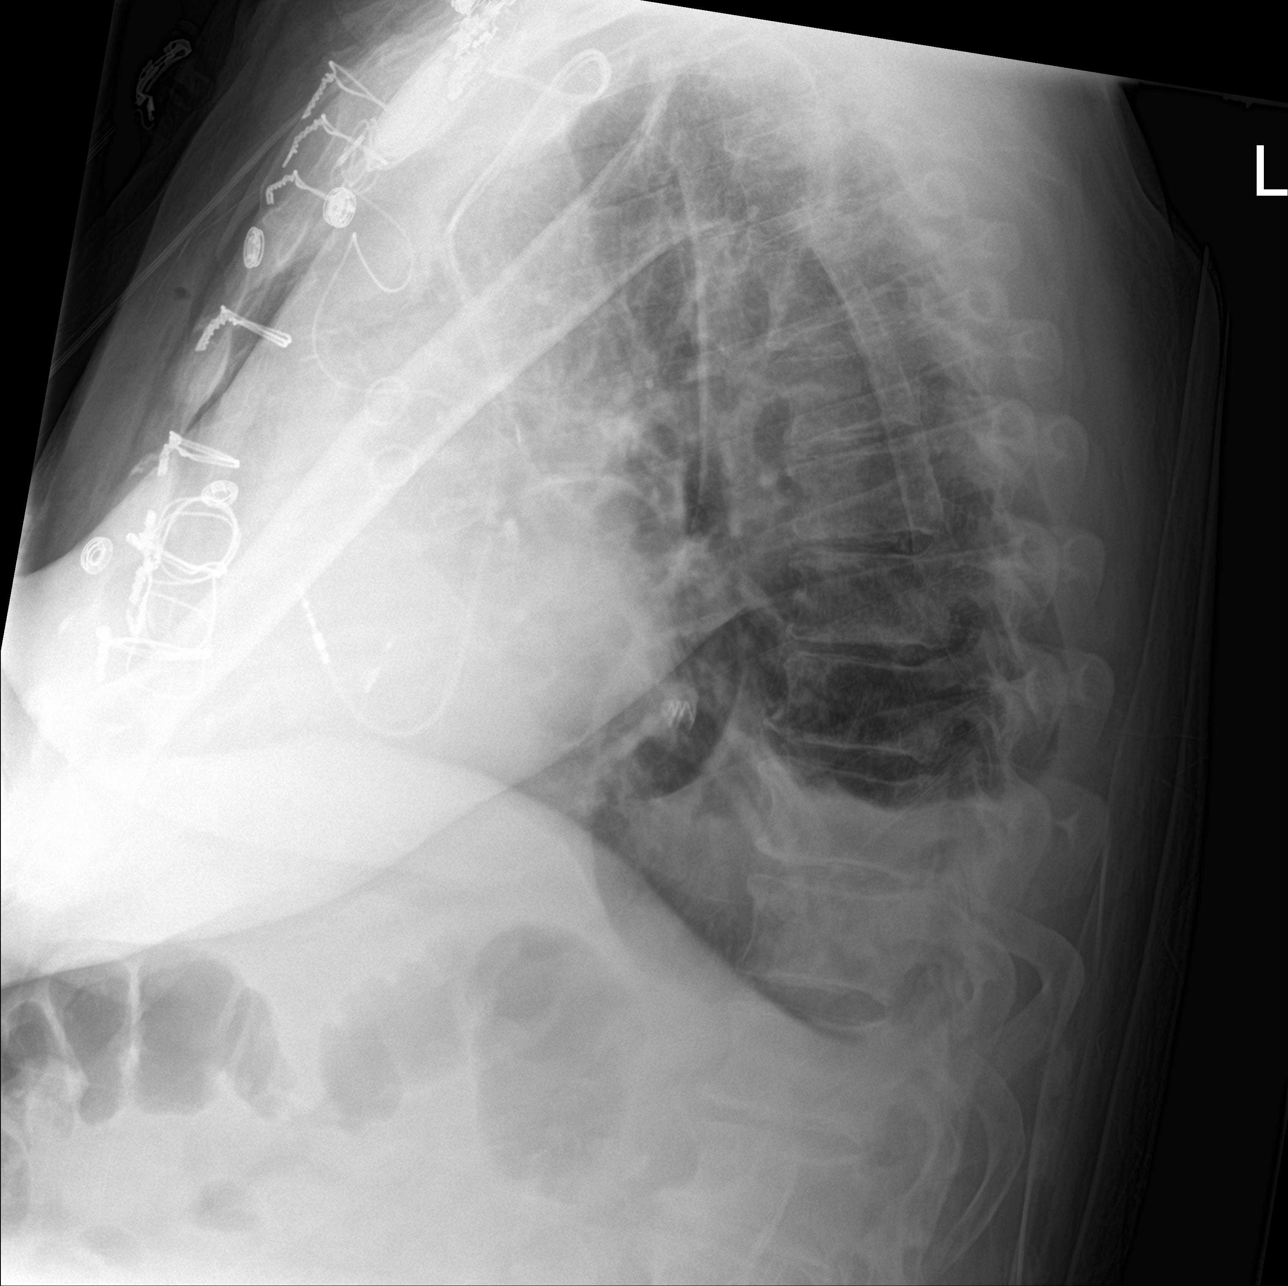

[3 of 3 positions shown; findings below may reference images not displayed]

FINDINGS: Cardiac shadow remains enlarged. Postsurgical changes are again
seen. Pacing device is again noted and stable. Stable left pleural
effusion and likely underlying atelectasis is seen. Some mild right
perihilar atelectatic changes are noted as well.
IMPRESSION: Stable changes in the left base. Slight increase in right perihilar
atelectasis.

## 2020-03-08 IMAGING — DX DG CHEST 2V
2 series · 2 of 2 positions shown · non-contrast
Comparison: 08/30/2018

CLINICAL DATA: History of CABG

EXAM:
CHEST - 2 VIEW

[chest pa]
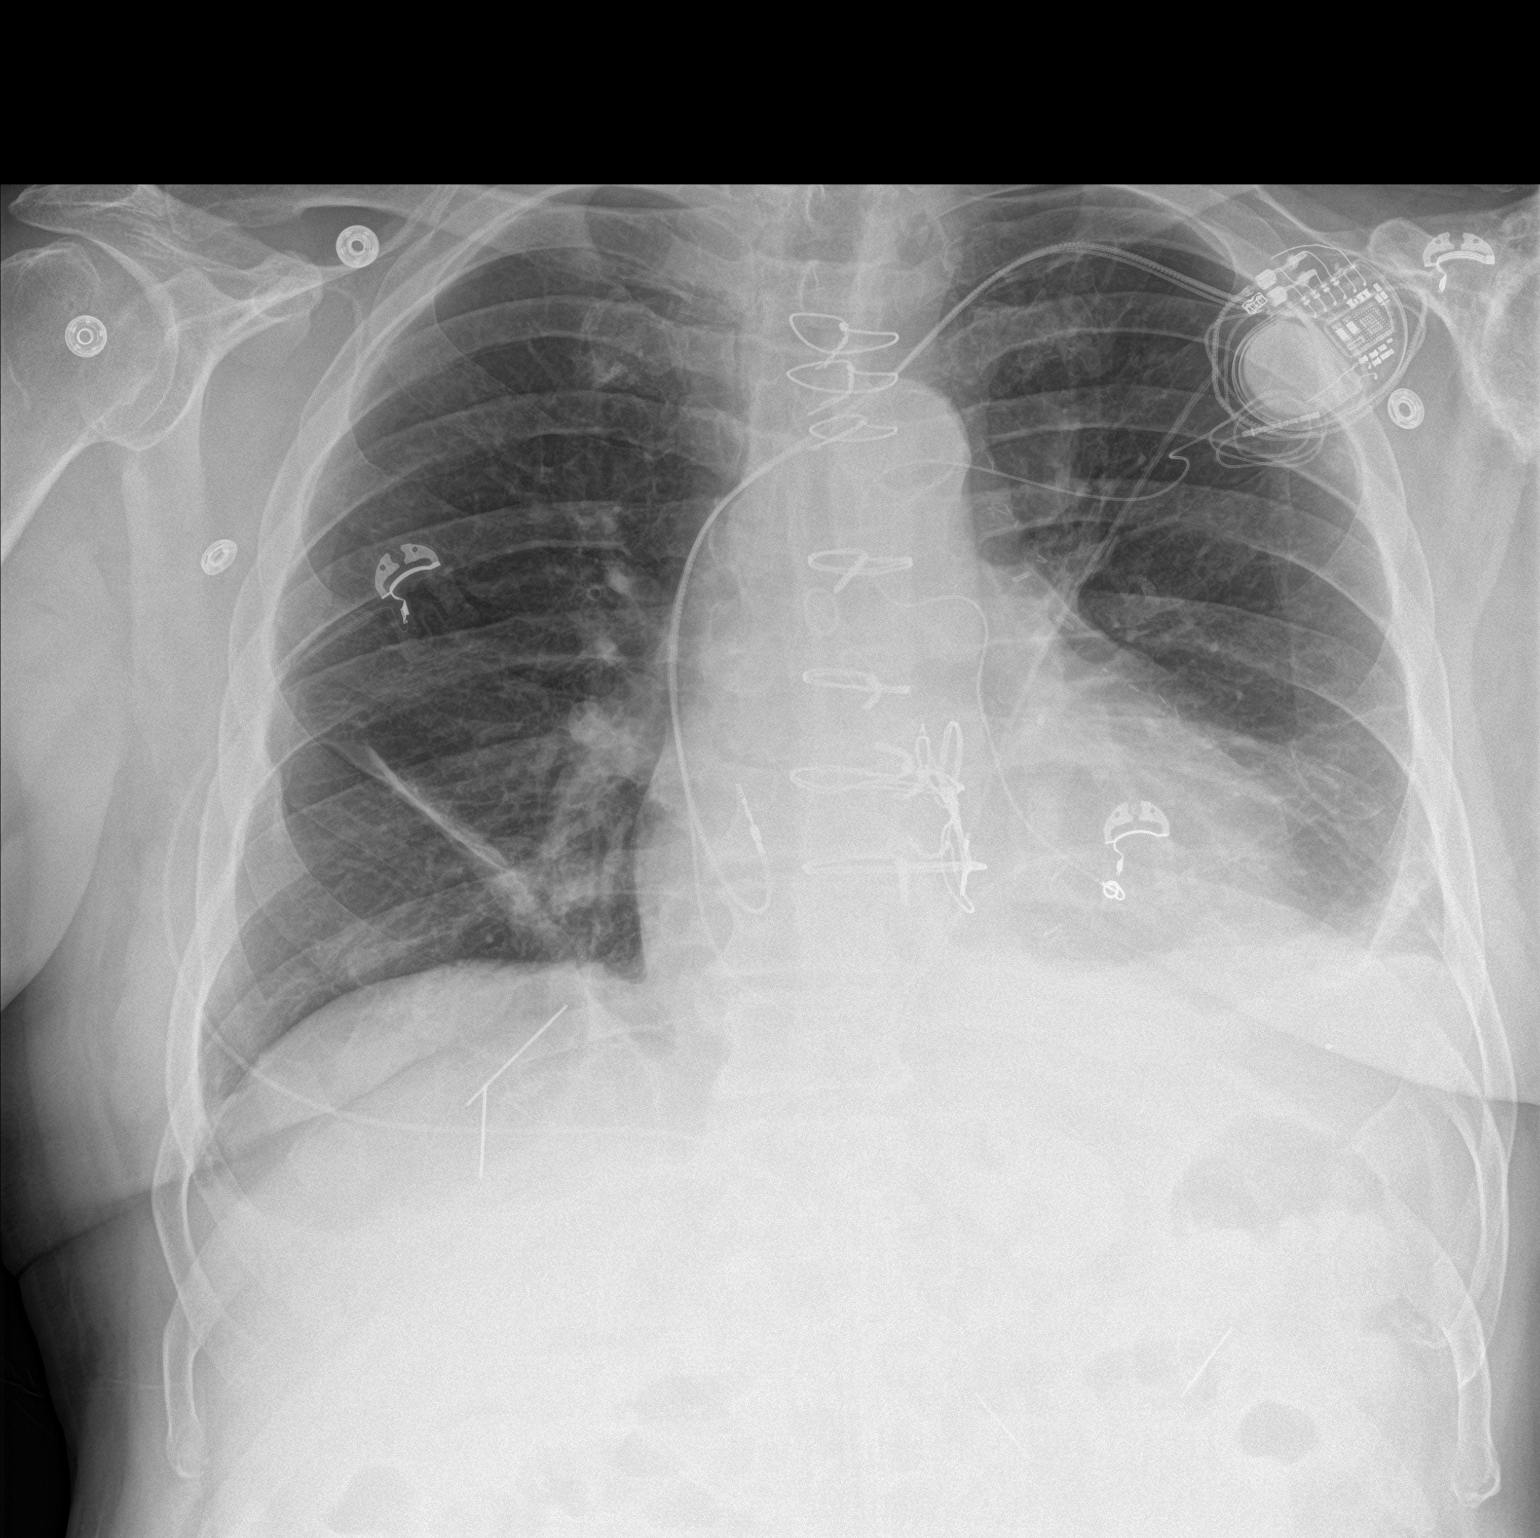

[chest lat]
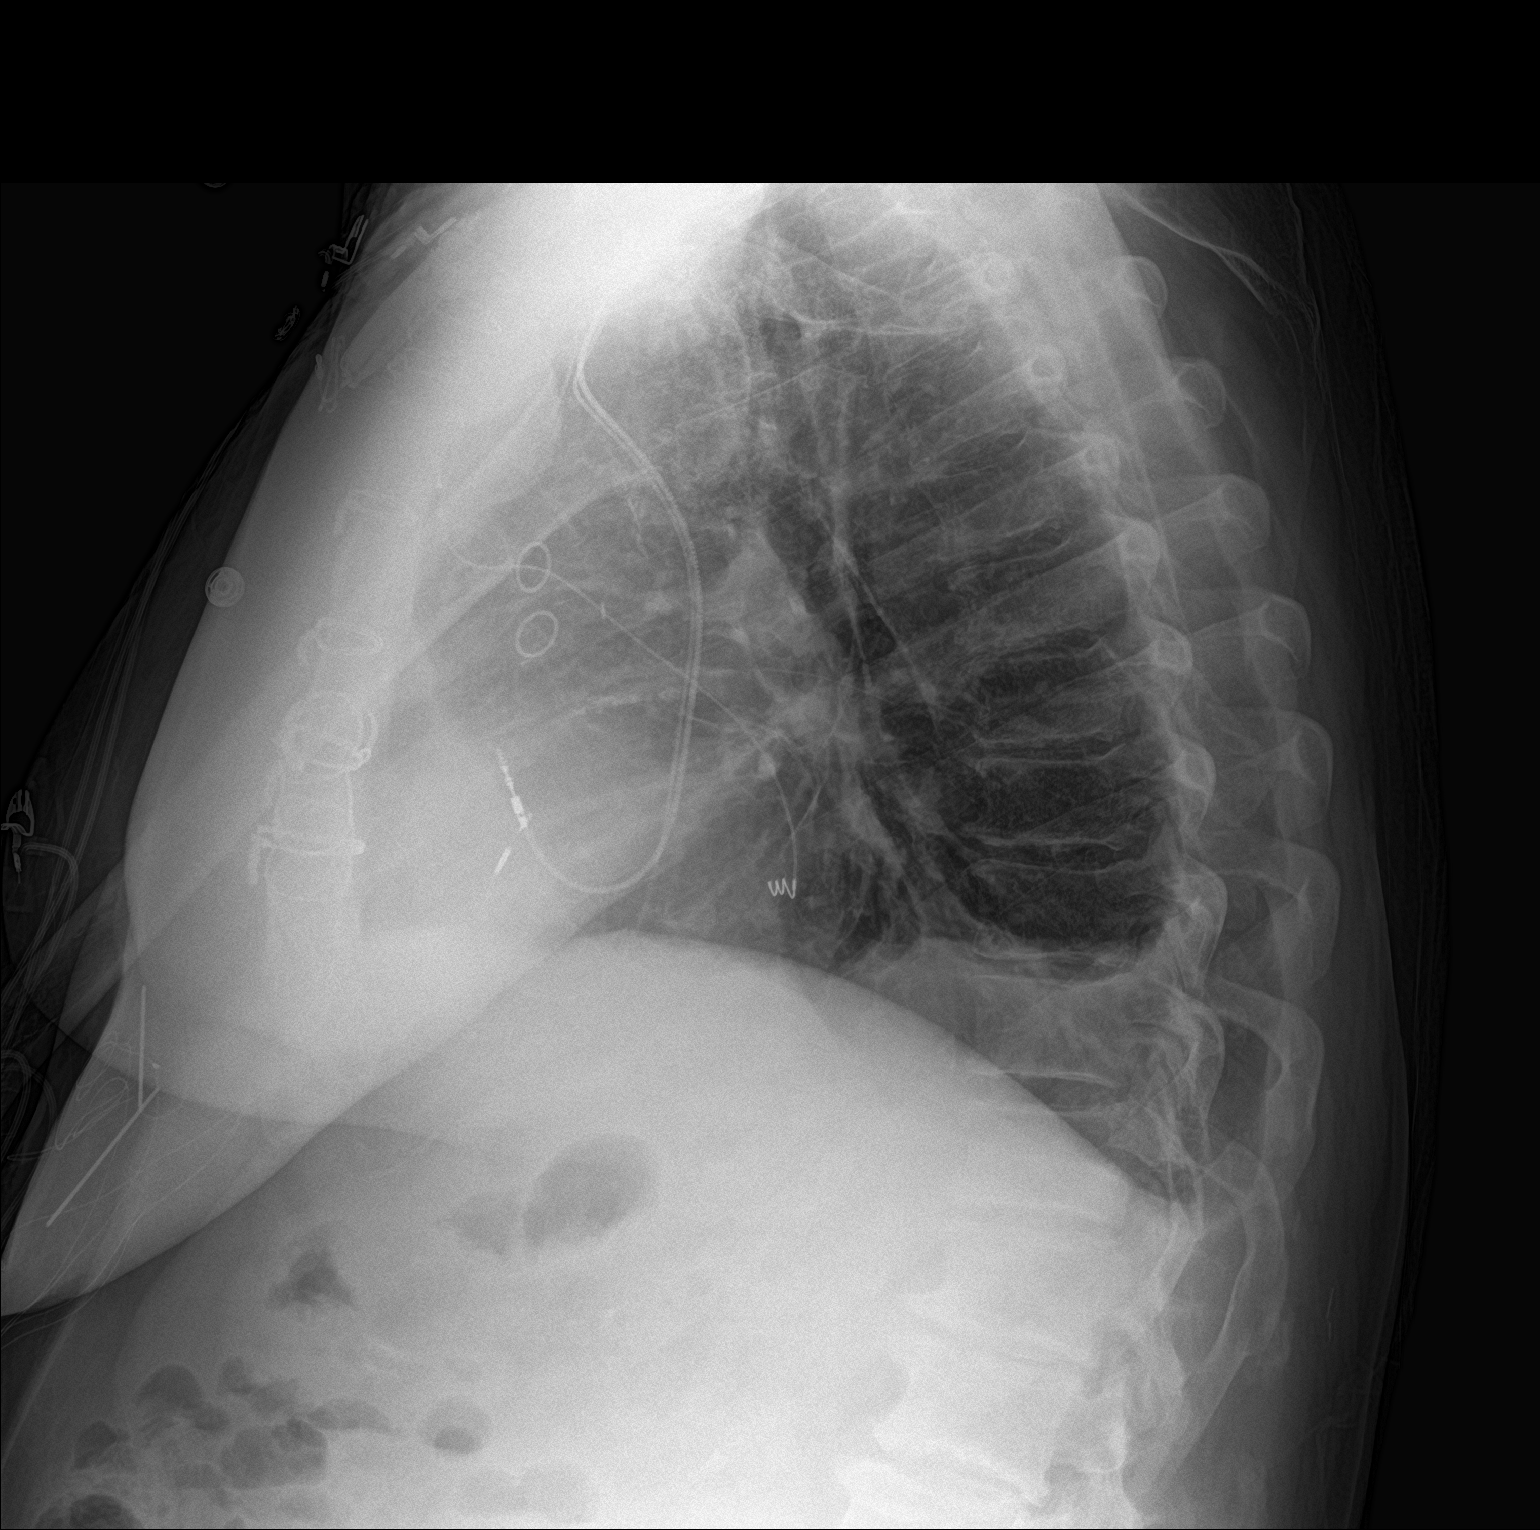

[2 of 2 positions shown; findings below may reference images not displayed]

FINDINGS: Improved aeration left base with decreased atelectasis and effusion.
Small left effusion remains. Mild right lower lobe atelectasis
unchanged. Negative for edema or pneumonia. Pacemaker unchanged.
IMPRESSION: Improvement in left lower lobe atelectasis and effusion. Mild right
lower lobe atelectasis unchanged.

## 2020-03-15 ENCOUNTER — Other Ambulatory Visit: Payer: Self-pay | Admitting: Internal Medicine

## 2020-03-23 ENCOUNTER — Encounter: Payer: Self-pay | Admitting: Internal Medicine

## 2020-03-23 ENCOUNTER — Other Ambulatory Visit: Payer: Self-pay

## 2020-03-23 ENCOUNTER — Ambulatory Visit (INDEPENDENT_AMBULATORY_CARE_PROVIDER_SITE_OTHER): Payer: PPO | Admitting: Internal Medicine

## 2020-03-23 VITALS — BP 102/64 | HR 73 | Ht 70.0 in | Wt 223.2 lb

## 2020-03-23 DIAGNOSIS — Z950 Presence of cardiac pacemaker: Secondary | ICD-10-CM | POA: Diagnosis not present

## 2020-03-23 DIAGNOSIS — I4892 Unspecified atrial flutter: Secondary | ICD-10-CM | POA: Diagnosis not present

## 2020-03-23 DIAGNOSIS — I5022 Chronic systolic (congestive) heart failure: Secondary | ICD-10-CM | POA: Diagnosis not present

## 2020-03-23 DIAGNOSIS — I442 Atrioventricular block, complete: Secondary | ICD-10-CM | POA: Diagnosis not present

## 2020-03-23 DIAGNOSIS — I255 Ischemic cardiomyopathy: Secondary | ICD-10-CM

## 2020-03-23 NOTE — Progress Notes (Signed)
Patient Care Team: [name], MD as PCP - General (Family Medicine)   HPI  Sean [name]. is a 79 y.o. 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 ECG  Aflutter A -CL-250 msec , started on apixoban because of cost, he would like to be transitioned to warfarin no bleeding-- plan had been to do flutter ablation but held off in fall 2020 2/2 concerns re covid   underwent flutter ablation 4/21  Post procedure on Apixoban    No change in functional status  The patient denies chest pain, nocturnal dyspnea, orthopnea or peripheral edema-lower extremity.  Some swelling L arm.  There have been no palpitations, lightheadedness or syncope  On Anticoagulation;  No bleeding issues .  7/19 Cath >> 3V CAD Post cath he developed a left upper extremity DVT and was started on Eliquis >> CABG 9/19 with placing of a LV epicardial lead   11/19  near syncopal episode Imaging demonstrated a large hemomothorax attributed per the family to a slow leak following his bypass surgery >>  transferred to [facility] and underwent VATS for drainage and decortication.  worsening LV function As above -- started on entresto, stopped because of cost  DATE TEST EF   9/15 Myoview 55-65% No ischemia  10/18 Echo   50-55 %   5/19 Echo   30-35 %   7/19 CTA  Prob 3 V CAD  7/19 LHC  3V CAD  12/19 Echo  20-25%   3/20 Echo  25-30%    11/20 Echo  35-40%     Date Cr K Hgb  1/20 1.49 5.0 12.0   4/21 1.59 4.2 13.2        Past Medical History:  Diagnosis Date  . Arthritis   . Asthma   . CAD (coronary artery disease)    a. LHC 7/19: ostLM 30%, ostLAD 30%, p-mLAD 99% mod calcified, ostD1 90%, ost-pLCx 80% ulcerative & mod calcified, p-mLCx 50%, OM2 50%, mod dz LPDA, RCA small w/ diffuse dz throughout; b. recommendation of CABG; c. 3-V  CABG 08/26/18 (LIMA-LAD, VG-Diag, VG-LCx)  . Chronic systolic CHF (congestive heart failure) (HCC)    a. EF 35-40%, diffuse HK with HK of the apical, anteroseptal, and anterior myocardium, Gr1DD, mild MR, mildly dilated LA, pacer wire noted in the RV with nl RVSF, PASP nl  . CKD (chronic kidney disease), stage III   . Deep vein thrombosis (DVT) of upper extremity ([place])    a. DVT of left subclavian dx'd 06/17/2018; b. Eliquis  . GERD (gastroesophageal reflux disease)    takes otc occasionally  . History of complete heart block    a. s/p MDT PPM 05/2017  . Hypertension   . Obesity   . Presence of permanent cardiac pacemaker    Dr. Beckie Salts implanted 05/2017  . PVC's (premature ventricular contractions)    a. PPM-induced    Past Surgical History:  Procedure Laterality Date  . A-FLUTTER ABLATION N/A 03/03/2020   Procedure: A-FLUTTER ABLATION;  Surgeon: Deboraha Sprang, MD;  Location: [place] [facility];  Service: Cardiovascular;  Laterality: N/A;  . BACK SURGERY    . CORONARY ARTERY BYPASS GRAFT N/A 08/26/2018   Procedure: CORONARY ARTERY BYPASS GRAFTING (CABG) x3 , Using left internal mammory artery and  right leg greater saphronious vein. Harvested endoscopically.;  Surgeon: Ivin Poot, MD;  Location: [place];  Service: Open Heart Surgery;  Laterality: N/A;  . EPICARDIAL PACING LEAD PLACEMENT N/A 08/26/2018   Procedure: LV PACING LEAD PLACEMENT;  Surgeon: Ivin Poot, MD;  Location: [place];  Service: Thoracic;  Laterality: N/A;  . EPICARDIAL PACING LEAD PLACEMENT Left 11/07/2018   Procedure: REVISE PACEMAKER LEAD LEFT CHEST;  Surgeon: Ivin Poot, MD;  Location: [place];  Service: Thoracic;  Laterality: Left;  . INSERT / REPLACE / REMOVE PACEMAKER     MEDTRONIC  . LEFT HEART CATH AND CORONARY ANGIOGRAPHY N/A 06/13/2018   Procedure: LEFT HEART CATH AND CORONARY ANGIOGRAPHY;  Surgeon: Wellington Hampshire, MD;  Location: [place] [facility];  Service: Cardiovascular;  Laterality: N/A;  .  LEG SURGERY    . PACEMAKER IMPLANT N/A 06/11/2017   Procedure: Pacemaker Implant;  Surgeon: Evans Lance, MD;  Location: [place] [facility];  Service: Cardiovascular;  Laterality: N/A;  . PLEURAL EFFUSION DRAINAGE Left 09/24/2018   Procedure: DRAINAGE OF LOCULATED PLEURAL EFFUSION;  Surgeon: Ivin Poot, MD;  Location: [place];  Service: Thoracic;  Laterality: Left;  . TEE WITHOUT CARDIOVERSION N/A 08/26/2018   Procedure: TRANSESOPHAGEAL ECHOCARDIOGRAM (TEE);  Surgeon: Prescott Gum, Collier Salina, MD;  Location: [place];  Service: Open Heart Surgery;  Laterality: N/A;  . TEE WITHOUT CARDIOVERSION  03/03/2020   Procedure: Transesophageal Echocardiogram (Tee);  Surgeon: Deboraha Sprang, MD;  Location: [place] [facility];  Service: Cardiovascular;;  . VIDEO ASSISTED THORACOSCOPY Left 09/24/2018   Procedure: VIDEO ASSISTED THORACOSCOPY;  Surgeon: Prescott Gum, Collier Salina, MD;  Location: [place];  Service: Thoracic;  Laterality: Left;    Current Outpatient Medications  Medication Sig Dispense Refill  . acetaminophen (TYLENOL) 500 MG tablet Take 500 mg by mouth every 6 (six) hours as needed for moderate pain or headache.    . allopurinol (ZYLOPRIM) 100 MG tablet Take 100 mg by mouth every other day. At night    . apixaban (ELIQUIS) 5 MG TABS tablet Take 1 tablet (5 mg total) by mouth 2 (two) times daily. 60 tablet 0  . atorvastatin (LIPITOR) 40 MG tablet TAKE ONE TABLET EVERY DAY 30 tablet 0  . EPINEPHrine (PRIMATENE MIST) 0.125 MG/ACT AERO Inhale 1 puff into the lungs daily as needed (shortness of breath).    . losartan (COZAAR) 50 MG tablet Take 1 tablet (50 mg total) by mouth daily. 90 tablet 3  . metoprolol succinate (TOPROL-XL) 25 MG 24 hr tablet Take 1 tablet (25 mg total) by mouth daily. 90 tablet 2  . Multiple Vitamin (MULTIVITAMIN WITH MINERALS) TABS tablet Take 1 tablet by mouth daily.    Marland Kitchen Specialty Vitamins Products (PROSTATE PO) Take 1 tablet by mouth at bedtime.     Marland Kitchen spironolactone (ALDACTONE) 25 MG  tablet TAKE 1/2 TABLET EVERYDAY (Patient taking differently: Take 12.5 mg by mouth daily. ) 15 tablet 1   No current facility-administered medications for this visit.    Allergies  Allergen Reactions  . Azithromycin Hives  . Erythromycin Hives      Review of Systems negative except from HPI and PMH  Physical Exam BP 102/64 (BP Location: Left Arm, Patient Position: Sitting, Cuff Size: Normal)   Pulse 73   Ht 5\' 10"  (1.778 m)   Wt 223 lb 4 oz (101.3 kg)   SpO2 95%   BMI 32.03 kg/m  Well developed and well nourished in no acute distress  HENT normal Neck supple with JVP-flat Clear Device pocket well healed; without hematoma or erythema.  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  The information was reviewed. No changes were made in the programming.     Aflutter persistent-recurrent previously paceterminated  S/p ablation  Hypertension  Adhesive capsulitis     No intercurrent atrial fibrillation or flutter  Will stop anticoagulation in 2 more weeks; On Anticoagulation;  No bleeding issues   Without symptoms of ischemia  Euvolemic continue current meds   Significant limitation of L arm movement at shoulder  Reviewed preliminary exercises for this and will reassess status in about 4 weeks and decide at that time re PT referral

## 2020-03-23 NOTE — Patient Instructions (Signed)
Medication Instructions:  - You will continue eliquis x 2 weeks, then stop  *If you need a refill on your cardiac medications before your next appointment, please call your pharmacy*   Lab Work: - none ordered  If you have labs (blood work) drawn today and your tests are completely normal, you will receive your results only by: Marland Kitchen MyChart Message (if you have MyChart) OR . A paper copy in the mail If you have any lab test that is abnormal or we need to change your treatment, we will call you to review the results.   Testing/Procedures: - none ordered   Follow-Up: At [facility], you and your health needs are our priority.  As part of our continuing mission to provide you with exceptional heart care, we have created designated Provider Care Teams.  These Care Teams include your primary Cardiologist (physician) and Advanced Practice Providers (APPs -  Physician Assistants and Nurse Practitioners) who all work together to provide you with the care you need, when you need it.  We recommend signing up for the patient portal called "MyChart".  Sign up information is provided on this After Visit Summary.  MyChart is used to connect with patients for Virtual Visits (Telemedicine).  Patients are able to view lab/test results, encounter notes, upcoming appointments, etc.  Non-urgent messages can be sent to your provider as well.   To learn more about what you can do with MyChart, go to [email or web address].    Your next appointment:   6 month(s)  The format for your next appointment:   In Person  Provider:   Virl Axe, MD   Other Instructions - Call the office in 2-3 weeks with how your shoulder is doing, if no real improvement, we will set you up with physical therapy for a frozen shoulder

## 2020-04-08 ENCOUNTER — Other Ambulatory Visit: Payer: Self-pay | Admitting: Internal Medicine

## 2020-04-12 ENCOUNTER — Other Ambulatory Visit: Payer: Self-pay | Admitting: Internal Medicine

## 2020-04-21 ENCOUNTER — Telehealth: Payer: Self-pay | Admitting: *Deleted

## 2020-04-21 NOTE — Telephone Encounter (Signed)
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

## 2020-04-21 NOTE — Telephone Encounter (Signed)
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 Caryl Comes in the office on 03/23/20.  Per the patient, he is doing much better. He states he was able to go fishing recently with no pain in the left shoulder.   He states "maybe I just needed to move it more." I advised the patient that is correct!  He is aware to call us back if he feels things are regressing with the shoulder.  The patient voices understanding and is agreeable.   To Dr. Caryl Comes as an Juluis Rainier.

## 2020-04-24 NOTE — Telephone Encounter (Signed)
Heather °Thanks SK   °

## 2020-04-28 ENCOUNTER — Ambulatory Visit (INDEPENDENT_AMBULATORY_CARE_PROVIDER_SITE_OTHER): Payer: PPO | Admitting: *Deleted

## 2020-04-28 DIAGNOSIS — R001 Bradycardia, unspecified: Secondary | ICD-10-CM | POA: Diagnosis not present

## 2020-04-28 LAB — CUP PACEART REMOTE DEVICE CHECK
Battery Remaining Longevity: 93 mo
Battery Voltage: 2.97 V
Brady Statistic AP VP Percent: 69.17 %
Brady Statistic AP VS Percent: 0 %
Brady Statistic AS VP Percent: 30.81 %
Brady Statistic AS VS Percent: 0.01 %
Brady Statistic RA Percent Paced: 69.14 %
Brady Statistic RV Percent Paced: 99.98 %
Date Time Interrogation Session: 20210609012049
Implantable Lead Implant Date: 20180723
Implantable Lead Implant Date: 20180723
Implantable Lead Location: 753859
Implantable Lead Location: 753860
Implantable Lead Model: 3830
Implantable Lead Model: 5076
Implantable Pulse Generator Implant Date: 20180723
Lead Channel Impedance Value: 304 Ohm
Lead Channel Impedance Value: 323 Ohm
Lead Channel Impedance Value: 380 Ohm
Lead Channel Impedance Value: 475 Ohm
Lead Channel Pacing Threshold Amplitude: 0.875 V
Lead Channel Pacing Threshold Amplitude: 1.125 V
Lead Channel Pacing Threshold Pulse Width: 0.4 ms
Lead Channel Pacing Threshold Pulse Width: 0.4 ms
Lead Channel Sensing Intrinsic Amplitude: 1 mV
Lead Channel Sensing Intrinsic Amplitude: 1 mV
Lead Channel Sensing Intrinsic Amplitude: 7.625 mV
Lead Channel Sensing Intrinsic Amplitude: 9.25 mV
Lead Channel Setting Pacing Amplitude: 2 V
Lead Channel Setting Pacing Amplitude: 2.5 V
Lead Channel Setting Pacing Pulse Width: 0.4 ms
Lead Channel Setting Sensing Sensitivity: 2.8 mV

## 2020-04-29 NOTE — Progress Notes (Signed)
Remote pacemaker transmission.   

## 2020-05-31 DIAGNOSIS — Z961 Presence of intraocular lens: Secondary | ICD-10-CM | POA: Diagnosis not present

## 2020-05-31 DIAGNOSIS — H26493 Other secondary cataract, bilateral: Secondary | ICD-10-CM | POA: Diagnosis not present

## 2020-05-31 DIAGNOSIS — H43811 Vitreous degeneration, right eye: Secondary | ICD-10-CM | POA: Diagnosis not present

## 2020-05-31 DIAGNOSIS — H524 Presbyopia: Secondary | ICD-10-CM | POA: Diagnosis not present

## 2020-05-31 DIAGNOSIS — H35033 Hypertensive retinopathy, bilateral: Secondary | ICD-10-CM | POA: Diagnosis not present

## 2020-06-10 ENCOUNTER — Other Ambulatory Visit: Payer: Self-pay | Admitting: Internal Medicine

## 2020-07-28 ENCOUNTER — Ambulatory Visit (INDEPENDENT_AMBULATORY_CARE_PROVIDER_SITE_OTHER): Payer: PPO | Admitting: *Deleted

## 2020-07-28 DIAGNOSIS — R001 Bradycardia, unspecified: Secondary | ICD-10-CM | POA: Diagnosis not present

## 2020-07-28 LAB — CUP PACEART REMOTE DEVICE CHECK
Battery Remaining Longevity: 88 mo
Battery Voltage: 2.97 V
Brady Statistic AP VP Percent: 70.31 %
Brady Statistic AP VS Percent: 0 %
Brady Statistic AS VP Percent: 29.68 %
Brady Statistic AS VS Percent: 0.01 %
Brady Statistic RA Percent Paced: 70.28 %
Brady Statistic RV Percent Paced: 99.99 %
Date Time Interrogation Session: 20210908002502
Implantable Lead Implant Date: 20180723
Implantable Lead Implant Date: 20180723
Implantable Lead Location: 753859
Implantable Lead Location: 753860
Implantable Lead Model: 3830
Implantable Lead Model: 5076
Implantable Pulse Generator Implant Date: 20180723
Lead Channel Impedance Value: 323 Ohm
Lead Channel Impedance Value: 323 Ohm
Lead Channel Impedance Value: 380 Ohm
Lead Channel Impedance Value: 475 Ohm
Lead Channel Pacing Threshold Amplitude: 0.75 V
Lead Channel Pacing Threshold Amplitude: 1.125 V
Lead Channel Pacing Threshold Pulse Width: 0.4 ms
Lead Channel Pacing Threshold Pulse Width: 0.4 ms
Lead Channel Sensing Intrinsic Amplitude: 1.125 mV
Lead Channel Sensing Intrinsic Amplitude: 1.125 mV
Lead Channel Sensing Intrinsic Amplitude: 7.625 mV
Lead Channel Sensing Intrinsic Amplitude: 9.25 mV
Lead Channel Setting Pacing Amplitude: 2 V
Lead Channel Setting Pacing Amplitude: 2.5 V
Lead Channel Setting Pacing Pulse Width: 0.4 ms
Lead Channel Setting Sensing Sensitivity: 2.8 mV

## 2020-07-29 NOTE — Progress Notes (Signed)
Remote pacemaker transmission.   

## 2020-08-27 DIAGNOSIS — R350 Frequency of micturition: Secondary | ICD-10-CM | POA: Diagnosis not present

## 2020-09-15 DIAGNOSIS — E785 Hyperlipidemia, unspecified: Secondary | ICD-10-CM | POA: Diagnosis not present

## 2020-09-15 DIAGNOSIS — R3911 Hesitancy of micturition: Secondary | ICD-10-CM | POA: Diagnosis not present

## 2020-09-15 DIAGNOSIS — I10 Essential (primary) hypertension: Secondary | ICD-10-CM | POA: Diagnosis not present

## 2020-09-15 DIAGNOSIS — Z125 Encounter for screening for malignant neoplasm of prostate: Secondary | ICD-10-CM | POA: Diagnosis not present

## 2020-09-15 DIAGNOSIS — M1A0620 Idiopathic chronic gout, left knee, without tophus (tophi): Secondary | ICD-10-CM | POA: Diagnosis not present

## 2020-09-15 DIAGNOSIS — R351 Nocturia: Secondary | ICD-10-CM | POA: Diagnosis not present

## 2020-09-16 DIAGNOSIS — R351 Nocturia: Secondary | ICD-10-CM | POA: Diagnosis not present

## 2020-09-16 DIAGNOSIS — R3911 Hesitancy of micturition: Secondary | ICD-10-CM | POA: Diagnosis not present

## 2020-09-16 DIAGNOSIS — R350 Frequency of micturition: Secondary | ICD-10-CM | POA: Diagnosis not present

## 2020-09-16 DIAGNOSIS — Z125 Encounter for screening for malignant neoplasm of prostate: Secondary | ICD-10-CM | POA: Diagnosis not present

## 2020-09-16 DIAGNOSIS — M1A0620 Idiopathic chronic gout, left knee, without tophus (tophi): Secondary | ICD-10-CM | POA: Diagnosis not present

## 2020-09-16 DIAGNOSIS — E785 Hyperlipidemia, unspecified: Secondary | ICD-10-CM | POA: Diagnosis not present

## 2020-09-16 DIAGNOSIS — I10 Essential (primary) hypertension: Secondary | ICD-10-CM | POA: Diagnosis not present

## 2020-09-21 ENCOUNTER — Encounter: Payer: Self-pay | Admitting: Internal Medicine

## 2020-09-21 ENCOUNTER — Ambulatory Visit (INDEPENDENT_AMBULATORY_CARE_PROVIDER_SITE_OTHER): Payer: PPO | Admitting: Internal Medicine

## 2020-09-21 ENCOUNTER — Other Ambulatory Visit: Payer: Self-pay

## 2020-09-21 VITALS — BP 92/58 | HR 70 | Ht 70.0 in | Wt 221.8 lb

## 2020-09-21 DIAGNOSIS — I493 Ventricular premature depolarization: Secondary | ICD-10-CM

## 2020-09-21 DIAGNOSIS — I10 Essential (primary) hypertension: Secondary | ICD-10-CM | POA: Diagnosis not present

## 2020-09-21 DIAGNOSIS — I255 Ischemic cardiomyopathy: Secondary | ICD-10-CM

## 2020-09-21 DIAGNOSIS — I442 Atrioventricular block, complete: Secondary | ICD-10-CM | POA: Diagnosis not present

## 2020-09-21 DIAGNOSIS — M7502 Adhesive capsulitis of left shoulder: Secondary | ICD-10-CM | POA: Diagnosis not present

## 2020-09-21 DIAGNOSIS — Z950 Presence of cardiac pacemaker: Secondary | ICD-10-CM

## 2020-09-21 NOTE — Patient Instructions (Signed)
Medication Instructions:  - Your physician recommends that you continue on your current medications as directed. Please refer to the Current Medication list given to you today.  *If you need a refill on your cardiac medications before your next appointment, please call your pharmacy*   Lab Work: - pending procedure date  If you have labs (blood work) drawn today and your tests are completely normal, you will receive your results only by: Marland Kitchen MyChart Message (if you have MyChart) OR . A paper copy in the mail If you have any lab test that is abnormal or we need to change your treatment, we will call you to review the results.   Testing/Procedures: 1)  Your physician has recommended that you have a pacemaker upgrade (CRT/ Bi-Ventricular device).   Nira Conn, RN will contact you in a few weeks to schedule this for after the 1st of the year  2) You have been referred to : Physical Therapy for your frozen shoulder. - the PT department should reach out to you directly to schedule a date/ time to start  Follow-Up: At [facility], you and your health needs are our priority.  As part of our continuing mission to provide you with exceptional heart care, we have created designated Provider Care Teams.  These Care Teams include your primary Cardiologist (physician) and Advanced Practice Providers (APPs -  Physician Assistants and Nurse Practitioners) who all work together to provide you with the care you need, when you need it.  We recommend signing up for the patient portal called "MyChart".  Sign up information is provided on this After Visit Summary.  MyChart is used to connect with patients for Virtual Visits (Telemedicine).  Patients are able to view lab/test results, encounter notes, upcoming appointments, etc.  Non-urgent messages can be sent to your provider as well.   To learn more about what you can do with MyChart, go to [email or web address].    Your next appointment:   Pending  procedure date   The format for your next appointment:   In Person  Provider:   Virl Axe, MD   Other Instructions    Biventricular Pacemaker Implantation A biventricular pacemaker implantation is a procedure to place (implant) a pacemaker near the heart. A pacemaker is a small, battery-powered device that helps control the heartbeat. If the heart beats irregularly or too slowly (bradycardia), the pacemaker will pace the heart so that it beats at a normal rate or a programmed rate. The parts of a biventricular pacemaker include:  The pulse generator. The pulse generator contains a small computer and a memory system that is programmed to keep the heart beating at a certain rate. The pulse generator also produces the electrical signal that triggers the heart to beat. This is implanted under the skin of the upper chest, near the collarbone.  Wires (leads). There may be two or three leads placed in the heart-one to the right atrium, one to the right ventricle, and one through the coronary sinus to reach the left ventricle of the heart. The leads are connected to the pulse generator. They transmit electrical pulses from the pulse generator to the heart. A biventricular pacemaker is used in people with heart failure due to weak heart muscles. The pacemaker can help the heart chambers pump more efficiently. This procedure may be done to treat:  Symptoms of severe heart failure, such as shortness of breath (dyspnea).  Loss of consciousness that happens repeatedly (syncope) because of an irregular heart rate. Tell  a health care provider about:  Any allergies you have.  All medicines you are taking, including vitamins, herbs, eye drops, creams, and over-the-counter medicines.  Any problems you or family members have had with anesthetic medicines.  Any blood disorders you have.  Any surgeries you have had.  Any medical conditions you have.  Whether you are pregnant or may be pregnant.  What are the risks? Generally, this is a safe procedure. However, problems may occur, including:  Infection.  Swelling, bruising, or bleeding at the pacemaker site, especially if you take blood thinners.  Allergic reactions to medicines or dyes.  Damage to blood vessels or nerves near the pacemaker.  Failure of the pacemaker to improve your condition.  Collapsed lung.  Blood clots.  Lead failures. This may require more surgery. What happens before the procedure? Medicines Ask your health care provider about:  Changing or stopping your regular medicines. This is especially important if you are taking diabetes medicines or blood thinners.  Taking medicines such as aspirin and ibuprofen. These medicines can thin your blood. Do not take these medicines unless your health care provider tells you to take them.  Taking over-the-counter medicines, vitamins, herbs, and supplements. Eating and drinking Follow instructions from your health care provider about eating and drinking, which may include:  8 hours before the procedure - stop eating heavy meals or foods, such as meat, fried foods, or fatty foods.  6 hours before the procedure - stop eating light meals or foods, such as toast or cereal.  6 hours before the procedure - stop drinking milk or drinks that contain milk.  2 hours before the procedure - stop drinking clear liquids. Staying hydrated Follow instructions from your health care provider about hydration, which may include:  Up to 2 hours before the procedure - you may continue to drink clear liquids, such as water, clear fruit juice, black coffee, and plain tea.  General instructions  Do not use any products that contain nicotine or tobacco for at least 4 weeks before the procedure. These products include cigarettes, e-cigarettes, and chewing tobacco. If you need help quitting, ask your health care provider.  You may have tests, including: ? Blood tests. ? Chest  X-rays. ? Echocardiogram. This is a test that uses sound waves (ultrasound) to produce an image of the heart.  Ask your health care provider: ? How your surgery site will be marked. ? What steps will be taken to help prevent infection. These may include:  Removing hair at the surgery site.  Washing skin with a germ-killing soap.  Taking antibiotic medicine.  Plan to have someone take you home from the hospital or clinic.  If you will be going home right after the procedure, plan to have someone with you for 24 hours. What happens during the procedure?   An IV will be inserted into one of your veins.  You will be connected to a heart monitor. Large electrode pads will be placed on the front and back of your chest.  You will be given one or more of the following: ? A medicine to help you relax (sedative). ? A medicine to make you fall asleep (general anesthetic). ? A medicine that is injected into an area of your body to numb the area (local anesthetic).  An incision will be made in your upper chest, near your heart.  The leads will be guided into your incision, through your blood vessels, and into your heart. Your health care provider will  use an X-ray machine (fluoroscope) to guide the leads into your heart.  The leads will be attached to your heart muscles and to the pulse generator.  The leads will be tested to make sure that they work correctly.  The pulse generator will be implanted under your skin, near your incision.  The heart monitor will be watched to ensure that the pacer is working correctly.  Your incision will be closed with stitches (sutures), skin glue, or adhesive tape.  A bandage (dressing) will be placed over your incision. The procedure may vary among health care providers and hospitals. What happens after the procedure?  Your blood pressure, heart rate, breathing rate, and blood oxygen level will be monitored until you leave the hospital or clinic.   You may continue to receive fluids and medicines through an IV.  You will be given pain medicine as needed.  You will have a chest X-ray done. This is to make sure that your pacemaker is in the right place.  You will be given a pacemaker identification card. This card lists the implant date, device model, and manufacturer of your pacemaker.  Do not drive for 24 hours if you were given a sedative during your procedure. Summary  A pacemaker is a small, battery-powered device that helps control the heartbeat.  A biventricular pacemaker is used in people with heart failure due to weak heart muscles.  Follow instructions from your health care provider about taking medicines and about eating and drinking before the procedure.  You will be given a pacemaker identification card that lists the implant date, device model, and manufacturer of your pacemaker. This information is not intended to replace advice given to you by your health care provider. Make sure you discuss any questions you have with your health care provider. Document Revised: 10/09/2018 Document Reviewed: 10/09/2018 Elsevier Patient Education  2020 [employer].

## 2020-09-21 NOTE — Progress Notes (Signed)
Patient Care Team: [name], MD as PCP - General (Family Medicine)   HPI  Sean [name]. is a 79 y.o. male Seen in followup for His pacing 3557 complicated lead induced PVCs initially at >9 % .No  EF assessment prior to device implantation   7/19 Cath >> 3V CAD Post cath he developed a left upper extremity DVT and was started on Eliquis >> CABG 9/19 with placing of a LV epicardial lead.  11/19  near syncopal episode Imaging demonstrated a large hemomothorax attributed per the family to a slow leak following his bypass surgery >>  transferred to [facility] and underwent VATS for drainage and decortication.    1/20 found to have persistent Aflutter > 72 hrs and referred to AFib clinic. ECG  Aflutter A -CL-250 msec , ablation 4/21  Post procedure on Apixoban  Chronic dyspnea.  Moderate fatigue.  No chest pain.  No peripheral edema.  No syncope.   Shoulder motion still markedly restricted   worsening LV function As above -- started on entresto, stopped because of cost  DATE TEST EF   9/15 Myoview 55-65% No ischemia  10/18 Echo   50-55 %   5/19 Echo   30-35 %   7/19 CTA  Prob 3 V CAD  7/19 LHC  3V CAD  12/19 Echo  20-25%   3/20 Echo  25-30%    11/20 Echo  35-40%   4/21 TEE 30-35%     Date Cr K Hgb  1/20 1.49 5.0 12.0   4/21 1.59 4.2 13.2        Past Medical History:  Diagnosis Date  . Arthritis   . Asthma   . CAD (coronary artery disease)    a. LHC 7/19: ostLM 30%, ostLAD 30%, p-mLAD 99% mod calcified, ostD1 90%, ost-pLCx 80% ulcerative & mod calcified, p-mLCx 50%, OM2 50%, mod dz LPDA, RCA small w/ diffuse dz throughout; b. recommendation of CABG; c. 3-V CABG 08/26/18 (LIMA-LAD, VG-Diag, VG-LCx)  . Chronic systolic CHF (congestive heart failure) (HCC)    a. EF 35-40%, diffuse HK with HK of the apical, anteroseptal, and anterior myocardium, Gr1DD, mild MR, mildly dilated LA, pacer wire noted in the RV with nl RVSF, PASP nl  . CKD (chronic kidney disease), stage  III ([place])   . Deep vein thrombosis (DVT) of upper extremity ([place])    a. DVT of left subclavian dx'd 06/17/2018; b. Eliquis  . GERD (gastroesophageal reflux disease)    takes otc occasionally  . History of complete heart block    a. s/p MDT PPM 05/2017  . Hypertension   . Obesity   . Presence of permanent cardiac pacemaker    Dr. Beckie Salts implanted 05/2017  . PVC's (premature ventricular contractions)    a. PPM-induced    Past Surgical History:  Procedure Laterality Date  . A-FLUTTER ABLATION N/A 03/03/2020   Procedure: A-FLUTTER ABLATION;  Surgeon: Deboraha Sprang, MD;  Location: [place] [facility];  Service: Cardiovascular;  Laterality: N/A;  . BACK SURGERY    . CORONARY ARTERY BYPASS GRAFT N/A 08/26/2018   Procedure: CORONARY ARTERY BYPASS GRAFTING (CABG) x3 , Using left internal mammory artery and right leg greater saphronious vein. Harvested endoscopically.;  Surgeon: Ivin Poot, MD;  Location: [place];  Service: Open Heart Surgery;  Laterality: N/A;  . EPICARDIAL PACING LEAD PLACEMENT N/A 08/26/2018   Procedure: LV PACING LEAD PLACEMENT;  Surgeon: Ivin Poot, MD;  Location: [place];  Service: Thoracic;  Laterality: N/A;  . EPICARDIAL PACING LEAD PLACEMENT Left 11/07/2018   Procedure: REVISE PACEMAKER LEAD LEFT CHEST;  Surgeon: Ivin Poot, MD;  Location: [place];  Service: Thoracic;  Laterality: Left;  . INSERT / REPLACE / REMOVE PACEMAKER     MEDTRONIC  . LEFT HEART CATH AND CORONARY ANGIOGRAPHY N/A 06/13/2018   Procedure: LEFT HEART CATH AND CORONARY ANGIOGRAPHY;  Surgeon: Wellington Hampshire, MD;  Location: [place] [facility];  Service: Cardiovascular;  Laterality: N/A;  . LEG SURGERY    . PACEMAKER IMPLANT N/A 06/11/2017   Procedure: Pacemaker Implant;  Surgeon: Evans Lance, MD;  Location: [place] [facility];  Service: Cardiovascular;  Laterality: N/A;  . PLEURAL EFFUSION DRAINAGE Left 09/24/2018   Procedure: DRAINAGE OF LOCULATED PLEURAL EFFUSION;  Surgeon: Ivin Poot, MD;  Location: [place];  Service: Thoracic;  Laterality: Left;  . TEE WITHOUT CARDIOVERSION N/A 08/26/2018   Procedure: TRANSESOPHAGEAL ECHOCARDIOGRAM (TEE);  Surgeon: Prescott Gum, Collier Salina, MD;  Location: [place];  Service: Open Heart Surgery;  Laterality: N/A;  . TEE WITHOUT CARDIOVERSION  03/03/2020   Procedure: Transesophageal Echocardiogram (Tee);  Surgeon: Deboraha Sprang, MD;  Location: [place] [facility];  Service: Cardiovascular;;  . VIDEO ASSISTED THORACOSCOPY Left 09/24/2018   Procedure: VIDEO ASSISTED THORACOSCOPY;  Surgeon: Prescott Gum, Collier Salina, MD;  Location: [place];  Service: Thoracic;  Laterality: Left;    Current Outpatient Medications  Medication Sig Dispense Refill  . tamsulosin (FLOMAX) 0.4 MG CAPS capsule Take by mouth.    Marland Kitchen acetaminophen (TYLENOL) 500 MG tablet Take 500 mg by mouth every 6 (six) hours as needed for moderate pain or headache.    . allopurinol (ZYLOPRIM) 100 MG tablet Take 100 mg by mouth every other day. At night    . apixaban (ELIQUIS) 5 MG TABS tablet Take 1 tablet (5 mg total) by mouth 2 (two) times daily. 60 tablet 0  . atorvastatin (LIPITOR) 40 MG tablet TAKE ONE TABLET EVERY DAY 90 tablet 3  . EPINEPHrine (PRIMATENE MIST) 0.125 MG/ACT AERO Inhale 1 puff into the lungs daily as needed (shortness of breath).    . losartan (COZAAR) 50 MG tablet TAKE 1 TABLET(50 MG) BY MOUTH DAILY 90 tablet 1  . metoprolol succinate (TOPROL-XL) 25 MG 24 hr tablet TAKE 1 TABLET BY MOUTH DAILY 90 tablet 1  . Multiple Vitamin (MULTIVITAMIN WITH MINERALS) TABS tablet Take 1 tablet by mouth daily.    Marland Kitchen Specialty Vitamins Products (PROSTATE PO) Take 1 tablet by mouth at bedtime.     Marland Kitchen spironolactone (ALDACTONE) 25 MG tablet TAKE 1/2 TABLET EVERYDAY 45 tablet 1   No current facility-administered medications for this visit.    Allergies  Allergen Reactions  . Azithromycin Hives  . Erythromycin Hives      Review of Systems negative except from HPI and PMH  Physical Exam  Ht 5\' 10"  (1.778 m)   BMI 32.03 kg/m   Height 5\' 10"  (1.778 m). BP (!) 92/58 (BP Location: Left Arm, Patient Position: Sitting, Cuff Size: Normal)   Pulse 70   Ht 5\' 10"  (1.778 m)   Wt 221 lb 12.8 oz (100.6 kg)   SpO2 95%   BMI 31.82 kg/m  Well developed and well nourished in no acute distress HENT normal Neck supple with JVP-flat Clear Device pocket well healed; without hematoma or erythema.  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  The information was reviewed. No changes were made in the programming.     Aflutter persistent-recurrent previously paceterminated  S/p ablation  Hypertension \ HFrEF- chronic  Adhesive capsulitis     No intercurrent atrial fibrillation or flutter  Chronic HFrEF manifested by fatigue with deteriorating LV function.  He has an epicardial lead placed by Dr. PVT at bypass surgery.  He and I have discussed incorporating that into his pacing system to try to reverse presumed pacemaker cardiomyopathy.  In the event that the lead is functional and has electrocardiographic evidence of benefit, we would use it.  Otherwise would anticipate alternative LV lead insertion.  He is agreeable.  His adhesive capsulitis continues to be noteworthy.  We will reach out to his PCP for referral to PT.

## 2020-10-19 ENCOUNTER — Other Ambulatory Visit: Payer: Self-pay

## 2020-10-19 ENCOUNTER — Ambulatory Visit: Payer: PPO | Attending: Internal Medicine

## 2020-10-19 DIAGNOSIS — M7502 Adhesive capsulitis of left shoulder: Secondary | ICD-10-CM | POA: Insufficient documentation

## 2020-10-19 NOTE — Therapy (Signed)
[facility] [address], [place], [address] Phone: [phone]   Fax:  [phone]  Physical Therapy Screen  Patient Details  Name: Sean [name]. MRN: [id number] Date of Birth: October 31, 1941 Referring Provider (PT): Alvis Lemmings, RN   Encounter Date: 10/19/2020   PT End of Session - 10/19/20 1518    Visit Number 1    Authorization Type 1    Authorization Time Period of 10 progress report    PT Start Time 1518           Past Medical History:  Diagnosis Date  . Arthritis   . Asthma   . CAD (coronary artery disease)    a. LHC 7/19: ostLM 30%, ostLAD 30%, p-mLAD 99% mod calcified, ostD1 90%, ost-pLCx 80% ulcerative & mod calcified, p-mLCx 50%, OM2 50%, mod dz LPDA, RCA small w/ diffuse dz throughout; b. recommendation of CABG; c. 3-V CABG 08/26/18 (LIMA-LAD, VG-Diag, VG-LCx)  . Chronic systolic CHF (congestive heart failure) (HCC)    a. EF 35-40%, diffuse HK with HK of the apical, anteroseptal, and anterior myocardium, Gr1DD, mild MR, mildly dilated LA, pacer wire noted in the RV with nl RVSF, PASP nl  . CKD (chronic kidney disease), stage III ([place])   . Deep vein thrombosis (DVT) of upper extremity ([place])    a. DVT of left subclavian dx'd 06/17/2018; b. Eliquis  . GERD (gastroesophageal reflux disease)    takes otc occasionally  . History of complete heart block    a. s/p MDT PPM 05/2017  . Hypertension   . Obesity   . Presence of permanent cardiac pacemaker    Dr. Beckie Salts implanted 05/2017  . PVC's (premature ventricular contractions)    a. PPM-induced    Past Surgical History:  Procedure Laterality Date  . A-FLUTTER ABLATION N/A 03/03/2020   Procedure: A-FLUTTER ABLATION;  Surgeon: Deboraha Sprang, MD;  Location: [place] [facility];  Service: Cardiovascular;  Laterality: N/A;  . BACK SURGERY    . CORONARY ARTERY BYPASS GRAFT N/A 08/26/2018   Procedure: CORONARY ARTERY BYPASS GRAFTING (CABG) x3 ,  Using left internal mammory artery and right leg greater saphronious vein. Harvested endoscopically.;  Surgeon: Ivin Poot, MD;  Location: [place];  Service: Open Heart Surgery;  Laterality: N/A;  . EPICARDIAL PACING LEAD PLACEMENT N/A 08/26/2018   Procedure: LV PACING LEAD PLACEMENT;  Surgeon: Ivin Poot, MD;  Location: [place];  Service: Thoracic;  Laterality: N/A;  . EPICARDIAL PACING LEAD PLACEMENT Left 11/07/2018   Procedure: REVISE PACEMAKER LEAD LEFT CHEST;  Surgeon: Ivin Poot, MD;  Location: [place];  Service: Thoracic;  Laterality: Left;  . INSERT / REPLACE / REMOVE PACEMAKER     MEDTRONIC  . LEFT HEART CATH AND CORONARY ANGIOGRAPHY N/A 06/13/2018   Procedure: LEFT HEART CATH AND CORONARY ANGIOGRAPHY;  Surgeon: Wellington Hampshire, MD;  Location: [place] [facility];  Service: Cardiovascular;  Laterality: N/A;  . LEG SURGERY    . PACEMAKER IMPLANT N/A 06/11/2017   Procedure: Pacemaker Implant;  Surgeon: Evans Lance, MD;  Location: [place] [facility];  Service: Cardiovascular;  Laterality: N/A;  . PLEURAL EFFUSION DRAINAGE Left 09/24/2018   Procedure: DRAINAGE OF LOCULATED PLEURAL EFFUSION;  Surgeon: Ivin Poot, MD;  Location: [place];  Service: Thoracic;  Laterality: Left;  . TEE WITHOUT CARDIOVERSION N/A 08/26/2018   Procedure: TRANSESOPHAGEAL ECHOCARDIOGRAM (TEE);  Surgeon: Prescott Gum, Collier Salina, MD;  Location: [facility];  Service: Open Heart Surgery;  Laterality: N/A;  . TEE WITHOUT CARDIOVERSION  03/03/2020   Procedure: Transesophageal Echocardiogram (Tee);  Surgeon: Deboraha Sprang, MD;  Location: [place] [facility];  Service: Cardiovascular;;  . VIDEO ASSISTED THORACOSCOPY Left 09/24/2018   Procedure: VIDEO ASSISTED THORACOSCOPY;  Surgeon: Ivin Poot, MD;  Location: [place];  Service: Thoracic;  Laterality: Left;    There were no vitals filed for this visit.    Subjective Assessment - 10/19/20 1524    Subjective L shoulder: 0/10 currently, 3/10 at worst for the past  month.    Pertinent History L shoulder adhesive capsulitis. X-ray showed arthritis. Difficulty raising his L arm up is difficult. 40 years ago, pt was lifting a weight in supine and twisted his shoulder doing it.  Goes to the gym and works out regularly prior to the pandemic.  Feels like he can do anything he wants to do with his arms right now. Unsure why he got sent to PT.  Has another another appointment with Dr. Jens Som in the middle of December. Does not feel like he needs PT for his L shoulder because he can already do everything he wants to comfortably for his L shoulder. .    Currently in Pain? No/denies    Pain Location Shoulder    Pain Orientation Left    Pain Descriptors / Indicators --   heavy   Pain Type Chronic pain    Aggravating Factors  Raising his arm up (demonstrates IR when performing flexion), reaching behind him,              [facility] PT Assessment - 10/19/20 1523      Assessment   Medical Diagnosis L shoulder adhesive capsulitis    Referring Provider (PT) Alvis Lemmings, RN    Onset Date/Surgical Date 09/21/20   Date PT referral signed     Balance Screen   Has the patient fallen in the past 6 months No    Has the patient had a decrease in activity level because of a fear of falling?  No    Is the patient reluctant to leave their home because of a fear of falling?  No                      Objective measurements completed on examination: See above findings.   L shoulder adhesive capsulitis Alvis Lemmings, RN  09/21/2020  PACEMAKER   No known latex allergies  HTN controlled per pt.      Skilled physical therapy evaluation not performed today secondary to pt reports of being already fully functional with his L shoulder, being able to do everything he wants to and does not feel like he needs treatment for it. Pt to return if he feels like he needs physical therapy for his L shoulder.                   Plan - 10/19/20 1549     Clinical Impression Statement Skilled physical therapy evaluation not performed today secondary to pt reports of being already fully functional with his L shoulder, being able to do everything he wants to and does not feel like he needs treatment for it. Pt to return if he feels like he needs physical therapy for his L shoulder.           Patient will benefit from skilled therapeutic intervention in order to improve the following deficits and impairments:  Visit Diagnosis: Adhesive capsulitis of left shoulder     Problem List Patient Active Problem List   Diagnosis Date Noted  . Ischemic cardiomyopathy 06/12/2019  . Cardiac pacemaker in situ 06/12/2019  . PVC's (premature ventricular contractions) 06/12/2019  . Atrial flutter ([place]) 06/12/2019  . Pleural effusion, left 09/24/2018  . Hemothorax on left 09/22/2018  . 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 Vertigo 06/26/2018  . Coronary artery disease   . Shortness of breath   . Complete heart block ([place]) 06/11/2017  . Acute kidney injury superimposed on CKD ([place]) 06/11/2017  . Obesity 06/11/2017  . Gout 06/11/2017  . Acute CHF (congestive heart failure) ([place]) 06/11/2017    Thank you for your referral.   Joneen Boers PT, DPT  10/19/2020, 3:52 PM  [facility] [address] [place], [address] Phone: [phone]   Fax:  [phone]  Name: Dayle Mcnerney. MRN: [id number] Date of Birth: Nov 28, 1940

## 2020-10-21 ENCOUNTER — Ambulatory Visit: Payer: PPO

## 2020-10-27 ENCOUNTER — Ambulatory Visit (INDEPENDENT_AMBULATORY_CARE_PROVIDER_SITE_OTHER): Payer: PPO

## 2020-10-27 DIAGNOSIS — R001 Bradycardia, unspecified: Secondary | ICD-10-CM | POA: Diagnosis not present

## 2020-10-27 LAB — CUP PACEART REMOTE DEVICE CHECK
Battery Remaining Longevity: 61 mo
Battery Voltage: 2.96 V
Brady Statistic AP VP Percent: 66.31 %
Brady Statistic AP VS Percent: 0 %
Brady Statistic AS VP Percent: 33.68 %
Brady Statistic AS VS Percent: 0.01 %
Brady Statistic RA Percent Paced: 66.27 %
Brady Statistic RV Percent Paced: 99.99 %
Date Time Interrogation Session: 20211208015152
Implantable Lead Implant Date: 20180723
Implantable Lead Implant Date: 20180723
Implantable Lead Location: 753859
Implantable Lead Location: 753860
Implantable Lead Model: 3830
Implantable Lead Model: 5076
Implantable Pulse Generator Implant Date: 20180723
Lead Channel Impedance Value: 304 Ohm
Lead Channel Impedance Value: 323 Ohm
Lead Channel Impedance Value: 380 Ohm
Lead Channel Impedance Value: 456 Ohm
Lead Channel Pacing Threshold Amplitude: 0.875 V
Lead Channel Pacing Threshold Amplitude: 1.375 V
Lead Channel Pacing Threshold Pulse Width: 0.4 ms
Lead Channel Pacing Threshold Pulse Width: 0.4 ms
Lead Channel Sensing Intrinsic Amplitude: 1.375 mV
Lead Channel Sensing Intrinsic Amplitude: 1.375 mV
Lead Channel Sensing Intrinsic Amplitude: 7.625 mV
Lead Channel Sensing Intrinsic Amplitude: 9.25 mV
Lead Channel Setting Pacing Amplitude: 2 V
Lead Channel Setting Pacing Amplitude: 2.5 V
Lead Channel Setting Pacing Pulse Width: 0.8 ms
Lead Channel Setting Sensing Sensitivity: 2.8 mV

## 2020-11-01 ENCOUNTER — Other Ambulatory Visit: Payer: Self-pay | Admitting: Internal Medicine

## 2020-11-01 NOTE — Telephone Encounter (Signed)
This is a [facility] pt 

## 2020-11-01 NOTE — Telephone Encounter (Signed)
Rx request sent to pharmacy.  

## 2020-11-09 NOTE — Progress Notes (Signed)
Remote pacemaker transmission.   

## 2020-11-23 ENCOUNTER — Encounter: Payer: Self-pay | Admitting: *Deleted

## 2020-11-23 ENCOUNTER — Other Ambulatory Visit: Payer: Self-pay | Admitting: *Deleted

## 2020-11-23 DIAGNOSIS — I5020 Unspecified systolic (congestive) heart failure: Secondary | ICD-10-CM

## 2020-11-23 DIAGNOSIS — Z01812 Encounter for preprocedural laboratory examination: Secondary | ICD-10-CM

## 2020-11-23 DIAGNOSIS — I255 Ischemic cardiomyopathy: Secondary | ICD-10-CM

## 2020-12-14 ENCOUNTER — Other Ambulatory Visit: Payer: Self-pay | Admitting: Internal Medicine

## 2020-12-27 ENCOUNTER — Other Ambulatory Visit: Payer: Self-pay

## 2020-12-27 MED ORDER — LOSARTAN POTASSIUM 50 MG PO TABS: ORAL_TABLET | ORAL | 0 refills | Status: DC

## 2020-12-31 ENCOUNTER — Encounter: Payer: Self-pay | Admitting: *Deleted

## 2021-01-05 ENCOUNTER — Telehealth: Payer: Self-pay | Admitting: Internal Medicine

## 2021-01-05 NOTE — Telephone Encounter (Signed)
Pt c/o medication issue:  1. Name of Medication: Eliquis  2. How are you currently taking this medication (dosage and times per day)? Not taking   3. Are you having a reaction (difficulty breathing--STAT)? No   4. What is your medication issue? Patient wife was told by hospital during pre procedure instructions that patient was on eliquis.  Patient and wife state the eliquis was not refilled the last visit and patient has not been taking .   Please call.

## 2021-01-05 NOTE — Telephone Encounter (Signed)
I reviewed the patient's chart.  He had an atrial flutter ablation on 03/03/21. He saw Dr. Caryl Comes on 03/23/20 post procedure and was advised to stop eliquis 2 weeks after that appointment.  I have spoken with the patient today and he confirms he is not taking eliquis now and hasn't been for some time.  I advised him this was still on his medication list after his last appointment, but I will remove this today.  The patient voices understanding and is agreeable.

## 2021-01-12 ENCOUNTER — Other Ambulatory Visit: Payer: Self-pay

## 2021-01-12 ENCOUNTER — Other Ambulatory Visit
Admission: RE | Admit: 2021-01-12 | Discharge: 2021-01-12 | Disposition: A | Discharge status: Home / Self Care | Payer: PPO | Source: Ambulatory Visit | Attending: Internal Medicine | Admitting: Internal Medicine

## 2021-01-12 DIAGNOSIS — Z01812 Encounter for preprocedural laboratory examination: Secondary | ICD-10-CM

## 2021-01-12 DIAGNOSIS — I255 Ischemic cardiomyopathy: Secondary | ICD-10-CM

## 2021-01-12 DIAGNOSIS — I5020 Unspecified systolic (congestive) heart failure: Secondary | ICD-10-CM | POA: Insufficient documentation

## 2021-01-12 DIAGNOSIS — Z20822 Contact with and (suspected) exposure to covid-19: Secondary | ICD-10-CM | POA: Insufficient documentation

## 2021-01-12 LAB — CBC WITH DIFFERENTIAL/PLATELET
Abs Immature Granulocytes: 0.02 10*3/uL (ref 0.00–0.07)
Basophils Absolute: 0 10*3/uL (ref 0.0–0.1)
Basophils Relative: 1 %
Eosinophils Absolute: 0.3 10*3/uL (ref 0.0–0.5)
Eosinophils Relative: 5 %
HCT: 38.7 % — ABNORMAL LOW (ref 39.0–52.0)
Hemoglobin: 12.4 g/dL — ABNORMAL LOW (ref 13.0–17.0)
Immature Granulocytes: 0 %
Lymphocytes Relative: 23 %
Lymphs Abs: 1.6 10*3/uL (ref 0.7–4.0)
MCH: 31.2 pg (ref 26.0–34.0)
MCHC: 32 g/dL (ref 30.0–36.0)
MCV: 97.5 fL (ref 80.0–100.0)
Monocytes Absolute: 0.6 10*3/uL (ref 0.1–1.0)
Monocytes Relative: 9 %
Neutro Abs: 4.3 10*3/uL (ref 1.7–7.7)
Neutrophils Relative %: 62 %
Platelets: 189 10*3/uL (ref 150–400)
RBC: 3.97 MIL/uL — ABNORMAL LOW (ref 4.22–5.81)
RDW: 12.1 % (ref 11.5–15.5)
WBC: 6.9 10*3/uL (ref 4.0–10.5)
nRBC: 0 % (ref 0.0–0.2)

## 2021-01-12 LAB — BASIC METABOLIC PANEL
Anion gap: 5 (ref 5–15)
BUN: 32 mg/dL — ABNORMAL HIGH (ref 8–23)
CO2: 31 mmol/L (ref 22–32)
Calcium: 9.1 mg/dL (ref 8.9–10.3)
Chloride: 103 mmol/L (ref 98–111)
Creatinine, Ser: 1.54 mg/dL — ABNORMAL HIGH (ref 0.61–1.24)
GFR, Estimated: 46 mL/min — ABNORMAL LOW (ref >60)
Glucose, Bld: 152 mg/dL — ABNORMAL HIGH (ref 70–99)
Potassium: 4.6 mmol/L (ref 3.5–5.1)
Sodium: 139 mmol/L (ref 135–145)

## 2021-01-12 LAB — SARS CORONAVIRUS 2 (TAT 6-24 HRS): SARS Coronavirus 2: NEGATIVE

## 2021-01-13 ENCOUNTER — Other Ambulatory Visit: Payer: Self-pay | Admitting: Internal Medicine

## 2021-01-13 DIAGNOSIS — I5020 Unspecified systolic (congestive) heart failure: Secondary | ICD-10-CM

## 2021-01-13 DIAGNOSIS — Z950 Presence of cardiac pacemaker: Secondary | ICD-10-CM

## 2021-01-13 NOTE — Progress Notes (Signed)
Instructed patient on the following items: Arrival time 0830 Nothing to eat or drink after midnight No meds AM of procedure Responsible person to drive you home and stay with you for 24 hrs Wash with special soap night before and morning of procedure  Not on any anticoagulant meds.

## 2021-01-14 ENCOUNTER — Encounter (HOSPITAL_COMMUNITY)
Admission: RE | Disposition: A | Discharge status: Home / Self Care | Payer: Self-pay | Source: Home / Self Care | Attending: Internal Medicine

## 2021-01-14 ENCOUNTER — Ambulatory Visit (HOSPITAL_COMMUNITY)
Admission: RE | Admit: 2021-01-14 | Discharge: 2021-01-14 | Disposition: A | Discharge status: Home / Self Care | Payer: PPO | Attending: Internal Medicine | Admitting: Internal Medicine

## 2021-01-14 DIAGNOSIS — Z539 Procedure and treatment not carried out, unspecified reason: Secondary | ICD-10-CM | POA: Insufficient documentation

## 2021-01-14 DIAGNOSIS — I110 Hypertensive heart disease with heart failure: Secondary | ICD-10-CM | POA: Insufficient documentation

## 2021-01-14 DIAGNOSIS — I5022 Chronic systolic (congestive) heart failure: Secondary | ICD-10-CM | POA: Insufficient documentation

## 2021-01-14 DIAGNOSIS — Z950 Presence of cardiac pacemaker: Secondary | ICD-10-CM

## 2021-01-14 DIAGNOSIS — I255 Ischemic cardiomyopathy: Secondary | ICD-10-CM | POA: Insufficient documentation

## 2021-01-14 DIAGNOSIS — M7500 Adhesive capsulitis of unspecified shoulder: Secondary | ICD-10-CM | POA: Insufficient documentation

## 2021-01-14 DIAGNOSIS — I5020 Unspecified systolic (congestive) heart failure: Secondary | ICD-10-CM

## 2021-01-14 DIAGNOSIS — Z79899 Other long term (current) drug therapy: Secondary | ICD-10-CM | POA: Diagnosis not present

## 2021-01-14 DIAGNOSIS — I4892 Unspecified atrial flutter: Secondary | ICD-10-CM | POA: Insufficient documentation

## 2021-01-14 SURGERY — BIV UPGRADE
Anesthesia: LOCAL

## 2021-01-14 MED ORDER — LIDOCAINE HCL 1 % IJ SOLN
INTRAMUSCULAR | Status: AC
  Filled 2021-01-14: qty 20

## 2021-01-14 MED ORDER — SODIUM CHLORIDE 0.9 % IV SOLN: 80.0000 mg | INTRAVENOUS | Status: DC

## 2021-01-14 MED ORDER — HEPARIN (PORCINE) IN NACL 1000-0.9 UT/500ML-% IV SOLN
INTRAVENOUS | Status: AC
  Filled 2021-01-14: qty 500

## 2021-01-14 MED ORDER — CHLORHEXIDINE GLUCONATE 4 % EX LIQD
4.0000 "application " | Freq: Once | CUTANEOUS | Status: DC
  Filled 2021-01-14: qty 60

## 2021-01-14 MED ORDER — SODIUM CHLORIDE 0.9 % IV SOLN: INTRAVENOUS | Status: DC

## 2021-01-14 MED ORDER — CEFAZOLIN SODIUM-DEXTROSE 2-4 GM/100ML-% IV SOLN
INTRAVENOUS | Status: AC
  Filled 2021-01-14: qty 100

## 2021-01-14 MED ORDER — CEFAZOLIN SODIUM-DEXTROSE 2-4 GM/100ML-% IV SOLN: 2.0000 g | INTRAVENOUS | Status: DC

## 2021-01-14 MED ORDER — SODIUM CHLORIDE 0.9 % IV SOLN
INTRAVENOUS | Status: AC
  Filled 2021-01-14: qty 2

## 2021-01-14 NOTE — H&P (Signed)
Patient Care Team: [name], MD as PCP - General (Family Medicine)   HPI  Sean Wale. is a 80 y.o. male  Admitted for CRT upgrade.  As we were talking about its role, in the context of worsening LV function and the association with dyspnea and worsening congestive failure, he reports that he has had no problems with his breathing and that his functional status is stable.  Records and Results Reviewed*   Past Medical History:  Diagnosis Date   Arthritis    Asthma    CAD (coronary artery disease)    a. LHC 7/19: ostLM 30%, ostLAD 30%, p-mLAD 99% mod calcified, ostD1 90%, ost-pLCx 80% ulcerative & mod calcified, p-mLCx 50%, OM2 50%, mod dz LPDA, RCA small w/ diffuse dz throughout; b. recommendation of CABG; c. 3-V CABG 08/26/18 (LIMA-LAD, VG-Diag, VG-LCx)   Chronic systolic CHF (congestive heart failure) (HCC)    a. EF 35-40%, diffuse HK with HK of the apical, anteroseptal, and anterior myocardium, Gr1DD, mild MR, mildly dilated LA, pacer wire noted in the RV with nl RVSF, PASP nl   CKD (chronic kidney disease), stage III ([place])    Deep vein thrombosis (DVT) of upper extremity ([place])    a. DVT of left subclavian dx'd 06/17/2018; b. Eliquis   GERD (gastroesophageal reflux disease)    takes otc occasionally   History of complete heart block    a. s/p MDT PPM 05/2017   Hypertension    Obesity    Presence of permanent cardiac pacemaker    Dr. Beckie Salts implanted 05/2017   PVC's (premature ventricular contractions)    a. PPM-induced    Past Surgical History:  Procedure Laterality Date   A-FLUTTER ABLATION N/A 03/03/2020   Procedure: A-FLUTTER ABLATION;  Surgeon: Deboraha Sprang, MD;  Location: [place] [facility];  Service: Cardiovascular;  Laterality: N/A;   BACK SURGERY     CORONARY ARTERY BYPASS GRAFT N/A 08/26/2018   Procedure: CORONARY ARTERY BYPASS GRAFTING (CABG) x3 , Using left internal mammory artery and right leg greater saphronious vein. Harvested  endoscopically.;  Surgeon: Ivin Poot, MD;  Location: [place];  Service: Open Heart Surgery;  Laterality: N/A;   EPICARDIAL PACING LEAD PLACEMENT N/A 08/26/2018   Procedure: LV PACING LEAD PLACEMENT;  Surgeon: Ivin Poot, MD;  Location: [place];  Service: Thoracic;  Laterality: N/A;   EPICARDIAL PACING LEAD PLACEMENT Left 11/07/2018   Procedure: REVISE PACEMAKER LEAD LEFT CHEST;  Surgeon: Ivin Poot, MD;  Location: [place];  Service: Thoracic;  Laterality: Left;   INSERT / REPLACE / [place] CATH AND CORONARY ANGIOGRAPHY N/A 06/13/2018   Procedure: LEFT HEART CATH AND CORONARY ANGIOGRAPHY;  Surgeon: Wellington Hampshire, MD;  Location: [place] [facility];  Service: Cardiovascular;  Laterality: N/A;   LEG SURGERY     PACEMAKER IMPLANT N/A 06/11/2017   Procedure: Pacemaker Implant;  Surgeon: Evans Lance, MD;  Location: [place] [facility];  Service: Cardiovascular;  Laterality: N/A;   PLEURAL EFFUSION DRAINAGE Left 09/24/2018   Procedure: DRAINAGE OF LOCULATED PLEURAL EFFUSION;  Surgeon: Ivin Poot, MD;  Location: [place];  Service: Thoracic;  Laterality: Left;   TEE WITHOUT CARDIOVERSION N/A 08/26/2018   Procedure: TRANSESOPHAGEAL ECHOCARDIOGRAM (TEE);  Surgeon: Prescott Gum, Collier Salina, MD;  Location: [place];  Service: Open Heart Surgery;  Laterality: N/A;   TEE WITHOUT CARDIOVERSION  03/03/2020   Procedure:  Transesophageal Echocardiogram (Tee);  Surgeon: Deboraha Sprang, MD;  Location: [place] [facility];  Service: Cardiovascular;;   VIDEO ASSISTED THORACOSCOPY Left 09/24/2018   Procedure: VIDEO ASSISTED THORACOSCOPY;  Surgeon: Prescott Gum, Collier Salina, MD;  Location: [place];  Service: Thoracic;  Laterality: Left;    Current Facility-Administered Medications  Medication Dose Route Frequency Provider Last Rate Last Admin   0.9 %  sodium chloride infusion   Intravenous Continuous Deboraha Sprang, MD       0.9 %  sodium chloride infusion   Intravenous Continuous  Deboraha Sprang, MD       ceFAZolin (ANCEF) IVPB 2g/100 mL premix  2 g Intravenous On Call Deboraha Sprang, MD       chlorhexidine (HIBICLENS) 4 % liquid 4 application  4 application Topical Once Deboraha Sprang, MD       gentamicin (GARAMYCIN) 80 mg in sodium chloride 0.9 % 500 mL irrigation  80 mg Irrigation On Call Deboraha Sprang, MD        Allergies  Allergen Reactions   Azithromycin Hives   Erythromycin Hives      Social History   Tobacco Use   Smoking status: Never Smoker   Smokeless tobacco: Never Used  Vaping Use   Vaping Use: Never used  Substance Use Topics   Alcohol use: Yes    Comment: occ.   Drug use: No     Family History  Problem Relation Age of Onset   Stroke Mother    Stroke Father    Alcohol abuse Father    Diabetes Brother    Alcohol abuse Brother    Stroke Maternal Grandfather      Current Meds  Medication Sig   acetaminophen (TYLENOL) 500 MG tablet Take 500 mg by mouth every 6 (six) hours as needed for moderate pain or headache.   allopurinol (ZYLOPRIM) 100 MG tablet Take 100 mg by mouth every other day. At night   atorvastatin (LIPITOR) 40 MG tablet TAKE ONE TABLET EVERY DAY (Patient taking differently: Take 40 mg by mouth daily.)   EPINEPHrine (PRIMATENE MIST) 0.125 MG/ACT AERO Inhale 1 puff into the lungs daily as needed (shortness of breath).   losartan (COZAAR) 50 MG tablet TAKE 1 TABLET(50 MG) BY MOUTH DAILY (Patient taking differently: Take 50 mg by mouth daily. TAKE 1 TABLET(50 MG) BY MOUTH DAILY)   metoprolol succinate (TOPROL-XL) 25 MG 24 hr tablet TAKE 1 TABLET BY MOUTH DAILY (Patient taking differently: Take 25 mg by mouth daily.)   Multiple Vitamin (MULTIVITAMIN WITH MINERALS) TABS tablet Take 1 tablet by mouth daily.   Specialty Vitamins Products (PROSTATE PO) Take 1 tablet by mouth at bedtime.    spironolactone (ALDACTONE) 25 MG tablet Take 0.5 tablets (12.5 mg total) by mouth daily.   tamsulosin (FLOMAX) 0.4 MG CAPS capsule Take 0.4  mg by mouth daily.     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  The information was reviewed. No changes were made in the programming.      Aflutter persistent-recurrent previously paceterminated  S/p ablation   Hypertension \ HFrEF- chronic   Adhesive capsulitis   Plan admit to upgrade to CRT.  However, at the time of this discussions he is history was different from what I had recorded at our last office visit, specifically that he has no shortness of breath or limitations in function.  That being the case, and his LV function has been stable for some years albeit having deteriorated following pacing, we have elected to postpone an intervention for a couple of months and see how it is that he is doing at springtime ensues.

## 2021-01-19 ENCOUNTER — Other Ambulatory Visit: Payer: Self-pay | Admitting: Internal Medicine

## 2021-01-26 ENCOUNTER — Ambulatory Visit (INDEPENDENT_AMBULATORY_CARE_PROVIDER_SITE_OTHER): Payer: PPO

## 2021-01-26 DIAGNOSIS — I255 Ischemic cardiomyopathy: Secondary | ICD-10-CM

## 2021-01-27 ENCOUNTER — Ambulatory Visit: Payer: PPO

## 2021-01-28 LAB — CUP PACEART REMOTE DEVICE CHECK
Battery Remaining Longevity: 72 mo
Battery Voltage: 2.96 V
Brady Statistic AP VP Percent: 63.48 %
Brady Statistic AP VS Percent: 0 %
Brady Statistic AS VP Percent: 36.51 %
Brady Statistic AS VS Percent: 0.01 %
Brady Statistic RA Percent Paced: 63.43 %
Brady Statistic RV Percent Paced: 99.99 %
Date Time Interrogation Session: 20220310231400
Implantable Lead Implant Date: 20180723
Implantable Lead Implant Date: 20180723
Implantable Lead Location: 753859
Implantable Lead Location: 753860
Implantable Lead Model: 3830
Implantable Lead Model: 5076
Implantable Pulse Generator Implant Date: 20180723
Lead Channel Impedance Value: 323 Ohm
Lead Channel Impedance Value: 342 Ohm
Lead Channel Impedance Value: 380 Ohm
Lead Channel Impedance Value: 456 Ohm
Lead Channel Pacing Threshold Amplitude: 0.875 V
Lead Channel Pacing Threshold Amplitude: 1.125 V
Lead Channel Pacing Threshold Pulse Width: 0.4 ms
Lead Channel Pacing Threshold Pulse Width: 0.4 ms
Lead Channel Sensing Intrinsic Amplitude: 1 mV
Lead Channel Sensing Intrinsic Amplitude: 1 mV
Lead Channel Sensing Intrinsic Amplitude: 7.625 mV
Lead Channel Sensing Intrinsic Amplitude: 9.25 mV
Lead Channel Setting Pacing Amplitude: 2 V
Lead Channel Setting Pacing Amplitude: 2.25 V
Lead Channel Setting Pacing Pulse Width: 0.4 ms
Lead Channel Setting Sensing Sensitivity: 2.8 mV

## 2021-02-03 NOTE — Progress Notes (Signed)
Remote pacemaker transmission.   

## 2021-02-08 ENCOUNTER — Other Ambulatory Visit: Payer: Self-pay | Admitting: Internal Medicine

## 2021-04-04 ENCOUNTER — Other Ambulatory Visit: Payer: Self-pay | Admitting: Internal Medicine

## 2021-04-05 DIAGNOSIS — M109 Gout, unspecified: Secondary | ICD-10-CM | POA: Diagnosis not present

## 2021-04-05 DIAGNOSIS — Z951 Presence of aortocoronary bypass graft: Secondary | ICD-10-CM | POA: Diagnosis not present

## 2021-04-05 DIAGNOSIS — I509 Heart failure, unspecified: Secondary | ICD-10-CM | POA: Diagnosis not present

## 2021-04-05 DIAGNOSIS — Z7982 Long term (current) use of aspirin: Secondary | ICD-10-CM | POA: Diagnosis not present

## 2021-04-05 DIAGNOSIS — D692 Other nonthrombocytopenic purpura: Secondary | ICD-10-CM | POA: Diagnosis not present

## 2021-04-05 DIAGNOSIS — I110 Hypertensive heart disease with heart failure: Secondary | ICD-10-CM | POA: Diagnosis not present

## 2021-04-05 DIAGNOSIS — I2510 Atherosclerotic heart disease of native coronary artery without angina pectoris: Secondary | ICD-10-CM | POA: Diagnosis not present

## 2021-04-05 DIAGNOSIS — E261 Secondary hyperaldosteronism: Secondary | ICD-10-CM | POA: Diagnosis not present

## 2021-04-05 DIAGNOSIS — Z8679 Personal history of other diseases of the circulatory system: Secondary | ICD-10-CM | POA: Diagnosis not present

## 2021-04-05 DIAGNOSIS — Z950 Presence of cardiac pacemaker: Secondary | ICD-10-CM | POA: Diagnosis not present

## 2021-04-19 ENCOUNTER — Ambulatory Visit: Payer: PPO | Admitting: Internal Medicine

## 2021-04-20 ENCOUNTER — Other Ambulatory Visit: Payer: Self-pay | Admitting: Internal Medicine

## 2021-04-20 NOTE — Telephone Encounter (Signed)
This is a [facility] pt 

## 2021-04-27 ENCOUNTER — Ambulatory Visit (INDEPENDENT_AMBULATORY_CARE_PROVIDER_SITE_OTHER): Payer: PPO

## 2021-04-27 DIAGNOSIS — I255 Ischemic cardiomyopathy: Secondary | ICD-10-CM | POA: Diagnosis not present

## 2021-04-29 LAB — CUP PACEART REMOTE DEVICE CHECK
Battery Remaining Longevity: 57 mo
Battery Voltage: 2.96 V
Brady Statistic AP VP Percent: 73.92 %
Brady Statistic AP VS Percent: 0 %
Brady Statistic AS VP Percent: 26.07 %
Brady Statistic AS VS Percent: 0.01 %
Brady Statistic RA Percent Paced: 73.85 %
Brady Statistic RV Percent Paced: 99.99 %
Date Time Interrogation Session: 20220610002552
Implantable Lead Implant Date: 20180723
Implantable Lead Implant Date: 20180723
Implantable Lead Location: 753859
Implantable Lead Location: 753860
Implantable Lead Model: 3830
Implantable Lead Model: 5076
Implantable Pulse Generator Implant Date: 20180723
Lead Channel Impedance Value: 304 Ohm
Lead Channel Impedance Value: 323 Ohm
Lead Channel Impedance Value: 380 Ohm
Lead Channel Impedance Value: 456 Ohm
Lead Channel Pacing Threshold Amplitude: 0.875 V
Lead Channel Pacing Threshold Amplitude: 1.375 V
Lead Channel Pacing Threshold Pulse Width: 0.4 ms
Lead Channel Pacing Threshold Pulse Width: 0.4 ms
Lead Channel Sensing Intrinsic Amplitude: 1.125 mV
Lead Channel Sensing Intrinsic Amplitude: 1.125 mV
Lead Channel Sensing Intrinsic Amplitude: 7.625 mV
Lead Channel Sensing Intrinsic Amplitude: 9.25 mV
Lead Channel Setting Pacing Amplitude: 2 V
Lead Channel Setting Pacing Amplitude: 2.75 V
Lead Channel Setting Pacing Pulse Width: 0.4 ms
Lead Channel Setting Sensing Sensitivity: 2.8 mV

## 2021-05-12 ENCOUNTER — Ambulatory Visit: Payer: PPO | Admitting: Internal Medicine

## 2021-05-16 ENCOUNTER — Ambulatory Visit (INDEPENDENT_AMBULATORY_CARE_PROVIDER_SITE_OTHER): Payer: PPO | Admitting: Internal Medicine

## 2021-05-16 ENCOUNTER — Encounter: Payer: Self-pay | Admitting: Internal Medicine

## 2021-05-16 ENCOUNTER — Other Ambulatory Visit: Payer: Self-pay

## 2021-05-16 VITALS — BP 104/58 | HR 72 | Ht 70.5 in | Wt 228.2 lb

## 2021-05-16 DIAGNOSIS — I255 Ischemic cardiomyopathy: Secondary | ICD-10-CM

## 2021-05-16 DIAGNOSIS — Z950 Presence of cardiac pacemaker: Secondary | ICD-10-CM | POA: Diagnosis not present

## 2021-05-16 DIAGNOSIS — I442 Atrioventricular block, complete: Secondary | ICD-10-CM | POA: Diagnosis not present

## 2021-05-16 DIAGNOSIS — I493 Ventricular premature depolarization: Secondary | ICD-10-CM | POA: Diagnosis not present

## 2021-05-16 NOTE — Progress Notes (Signed)
Patient Care Team: [name], MD as PCP - General (Family Medicine)   HPI  Sean [name]. is a 80 y.o. male Seen in followup for His pacing 0034 complicated lead induced PVCs initially at >9 % .No  EF assessment prior to device implantation   7/19 Cath >> 3V CAD Post cath he developed a left upper extremity DVT and was started on Eliquis >> CABG 9/19 with placing of a LV epicardial lead.  11/19  near syncopal episode Imaging demonstrated a large hemomothorax attributed per the family to a slow leak following his bypass surgery >>  transferred to [facility] and underwent VATS for drainage and decortication.    1/20 found to have persistent Aflutter > 72 hrs and referred to AFib clinic. ECG  Aflutter A -CL-250 msec , ablation 4/21     Developed cardiomyopathy with plan to upgrade to CRT 2/22; however at that hospitalization he was adamant that he had no functional limitations/dyspnea  and with his chronic but stable LV dysfunction, elected to not undertake upgrade  The patient denies chest pain, shortness of breath, nocturnal dyspnea, orthopnea or peripheral edema.  There have been no palpitations, lightheadedness or syncope. His wife complains that he is sleeping too much but this is new since it got very hot outside.  worsening LV function As above -- started on entresto, stopped because of cost  DATE TEST EF   9/15 Myoview 55-65% No ischemia  10/18 Echo   50-55 %   5/19 Echo   30-35 %   7/19 CTA  Prob 3 V CAD  7/19 LHC  3V CAD  12/19 Echo  20-25%   3/20 Echo  25-30%    11/20 Echo  35-40%   4/21 TEE 30-35%     Date Cr K Hgb  1/20 1.49 5.0 12.0   4/21 1.59 4.2 13.2  2/22 1.54 4.6 12.4        Past Medical History:  Diagnosis Date   Arthritis    Asthma    CAD (coronary artery disease)    a. LHC 7/19: ostLM 30%, ostLAD 30%, p-mLAD 99% mod calcified, ostD1 90%, ost-pLCx 80% ulcerative & mod calcified, p-mLCx 50%, OM2 50%, mod dz LPDA, RCA small w/ diffuse dz  throughout; b. recommendation of CABG; c. 3-V CABG 08/26/18 (LIMA-LAD, VG-Diag, VG-LCx)   Chronic systolic CHF (congestive heart failure) (HCC)    a. EF 35-40%, diffuse HK with HK of the apical, anteroseptal, and anterior myocardium, Gr1DD, mild MR, mildly dilated LA, pacer wire noted in the RV with nl RVSF, PASP nl   CKD (chronic kidney disease), stage III ([place])    Deep vein thrombosis (DVT) of upper extremity ([place])    a. DVT of left subclavian dx'd 06/17/2018; b. Eliquis   GERD (gastroesophageal reflux disease)    takes otc occasionally   History of complete heart block    a. s/p MDT PPM 05/2017   Hypertension    Obesity    Presence of permanent cardiac pacemaker    Dr. Beckie Salts implanted 05/2017   PVC's (premature ventricular contractions)    a. PPM-induced    Past Surgical History:  Procedure Laterality Date   A-FLUTTER ABLATION N/A 03/03/2020   Procedure: A-FLUTTER ABLATION;  Surgeon: Deboraha Sprang, MD;  Location: [place] [facility];  Service: Cardiovascular;  Laterality: N/A;   BACK SURGERY     CORONARY ARTERY BYPASS GRAFT N/A 08/26/2018   Procedure: CORONARY ARTERY BYPASS GRAFTING (CABG)  x3 , Using left internal mammory artery and right leg greater saphronious vein. Harvested endoscopically.;  Surgeon: Ivin Poot, MD;  Location: [place];  Service: Open Heart Surgery;  Laterality: N/A;   EPICARDIAL PACING LEAD PLACEMENT N/A 08/26/2018   Procedure: LV PACING LEAD PLACEMENT;  Surgeon: Ivin Poot, MD;  Location: [place];  Service: Thoracic;  Laterality: N/A;   EPICARDIAL PACING LEAD PLACEMENT Left 11/07/2018   Procedure: REVISE PACEMAKER LEAD LEFT CHEST;  Surgeon: Ivin Poot, MD;  Location: [place];  Service: Thoracic;  Laterality: Left;   INSERT / REPLACE / [place] CATH AND CORONARY ANGIOGRAPHY N/A 06/13/2018   Procedure: LEFT HEART CATH AND CORONARY ANGIOGRAPHY;  Surgeon: Wellington Hampshire, MD;  Location: [place] [facility];  Service:  Cardiovascular;  Laterality: N/A;   LEG SURGERY     PACEMAKER IMPLANT N/A 06/11/2017   Procedure: Pacemaker Implant;  Surgeon: Evans Lance, MD;  Location: [place] [facility];  Service: Cardiovascular;  Laterality: N/A;   PLEURAL EFFUSION DRAINAGE Left 09/24/2018   Procedure: DRAINAGE OF LOCULATED PLEURAL EFFUSION;  Surgeon: Ivin Poot, MD;  Location: [place];  Service: Thoracic;  Laterality: Left;   TEE WITHOUT CARDIOVERSION N/A 08/26/2018   Procedure: TRANSESOPHAGEAL ECHOCARDIOGRAM (TEE);  Surgeon: Prescott Gum, Collier Salina, MD;  Location: [place];  Service: Open Heart Surgery;  Laterality: N/A;   TEE WITHOUT CARDIOVERSION  03/03/2020   Procedure: Transesophageal Echocardiogram (Tee);  Surgeon: Deboraha Sprang, MD;  Location: [place] [facility];  Service: Cardiovascular;;   VIDEO ASSISTED THORACOSCOPY Left 09/24/2018   Procedure: VIDEO ASSISTED THORACOSCOPY;  Surgeon: Prescott Gum, Collier Salina, MD;  Location: [place];  Service: Thoracic;  Laterality: Left;    Current Outpatient Medications  Medication Sig Dispense Refill   acetaminophen (TYLENOL) 500 MG tablet Take 500 mg by mouth every 6 (six) hours as needed for moderate pain or headache.     allopurinol (ZYLOPRIM) 100 MG tablet Take 100 mg by mouth every other day. At night     atorvastatin (LIPITOR) 40 MG tablet TAKE ONE TABLET EVERY DAY (Patient taking differently: Take 40 mg by mouth daily.) 90 tablet 3   EPINEPHrine (PRIMATENE MIST) 0.125 MG/ACT AERO Inhale 1 puff into the lungs daily as needed (shortness of breath).     losartan (COZAAR) 50 MG tablet TAKE 1 TABLET BY MOUTH DAILY. 90 tablet 0   metoprolol succinate (TOPROL-XL) 25 MG 24 hr tablet TAKE 1 TABLET BY MOUTH DAILY 90 tablet 0   Multiple Vitamin (MULTIVITAMIN WITH MINERALS) TABS tablet Take 1 tablet by mouth daily.     Specialty Vitamins Products (PROSTATE PO) Take 1 tablet by mouth at bedtime.      spironolactone (ALDACTONE) 25 MG tablet TAKE 1/2 TABLET EVERYDAY 45 tablet 0   tamsulosin  (FLOMAX) 0.4 MG CAPS capsule Take 0.4 mg by mouth daily.     No current facility-administered medications for this visit.    Allergies  Allergen Reactions   Azithromycin Hives   Erythromycin Hives      Review of Systems negative except from HPI and PMH  Physical Exam BP (!) 104/58   Pulse 72   Ht 5' 10.5" (1.791 m)   Wt 228 lb 3.2 oz (103.5 kg)   SpO2 95%   BMI 32.28 kg/m  Well developed and well nourished in no acute distress HENT normal Neck supple with JVP-flat Clear Device pocket well healed; without hematoma or erythema.  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  The information was reviewed. No changes were made in the programming.     Aflutter persistent-recurrent previously paceterminated  S/p ablation  Hypertension \ HFrEF- chronic    No intercurrent afib flutter post ablation   no longer on anticoagulation   Pt heart failure status is stable. Continue spiro daily @ 25  mg.  Electrolytes are stable.   Have deferred for now CRT upgrade  K stable 2/22 on aldactone  For his cardiomyopathy, need to clarify whether he is on losartan, we think so and he is not sure so will have to get him to call in; continue metoprolol succinate 25 aldactone 25, uptitration of which have been limited by hypotension  PVCs also largely quiescient

## 2021-05-16 NOTE — Patient Instructions (Signed)
Medication Instructions:  Your physician recommends that you continue on your current medications as directed. Please refer to the Current Medication list given to you today.  *If you need a refill on your cardiac medications before your next appointment, please call your pharmacy*   Lab Work: None ordered.  If you have labs (blood work) drawn today and your tests are completely normal, you will receive your results only by: [place] (if you have MyChart) OR A paper copy in the mail If you have any lab test that is abnormal or we need to change your treatment, we will call you to review the results.   Testing/Procedures: None ordered.    Follow-Up: At [facility], you and your health needs are our priority.  As part of our continuing mission to provide you with exceptional heart care, we have created designated Provider Care Teams.  These Care Teams include your primary Cardiologist (physician) and Advanced Practice Providers (APPs -  Physician Assistants and Nurse Practitioners) who all work together to provide you with the care you need, when you need it.  We recommend signing up for the patient portal called "MyChart".  Sign up information is provided on this After Visit Summary.  MyChart is used to connect with patients for Virtual Visits (Telemedicine).  Patients are able to view lab/test results, encounter notes, upcoming appointments, etc.  Non-urgent messages can be sent to your provider as well.   To learn more about what you can do with MyChart, go to [email or web address].    Your next appointment:   6 month(s)  The format for your next appointment:   In Person  Provider:   Virl Axe, MD   Other Instructions Please call us wo verify if you are taking Losartan

## 2021-05-20 NOTE — Progress Notes (Signed)
Remote pacemaker transmission.   

## 2021-05-24 DIAGNOSIS — D692 Other nonthrombocytopenic purpura: Secondary | ICD-10-CM | POA: Diagnosis not present

## 2021-05-24 DIAGNOSIS — I509 Heart failure, unspecified: Secondary | ICD-10-CM | POA: Diagnosis not present

## 2021-05-24 DIAGNOSIS — Z7982 Long term (current) use of aspirin: Secondary | ICD-10-CM | POA: Diagnosis not present

## 2021-05-24 DIAGNOSIS — I130 Hypertensive heart and chronic kidney disease with heart failure and stage 1 through stage 4 chronic kidney disease, or unspecified chronic kidney disease: Secondary | ICD-10-CM | POA: Diagnosis not present

## 2021-05-24 DIAGNOSIS — E261 Secondary hyperaldosteronism: Secondary | ICD-10-CM | POA: Diagnosis not present

## 2021-05-24 DIAGNOSIS — N1832 Chronic kidney disease, stage 3b: Secondary | ICD-10-CM | POA: Diagnosis not present

## 2021-05-24 DIAGNOSIS — I25118 Atherosclerotic heart disease of native coronary artery with other forms of angina pectoris: Secondary | ICD-10-CM | POA: Diagnosis not present

## 2021-05-24 DIAGNOSIS — Z951 Presence of aortocoronary bypass graft: Secondary | ICD-10-CM | POA: Diagnosis not present

## 2021-05-24 DIAGNOSIS — Z6831 Body mass index (BMI) 31.0-31.9, adult: Secondary | ICD-10-CM | POA: Diagnosis not present

## 2021-05-24 DIAGNOSIS — M109 Gout, unspecified: Secondary | ICD-10-CM | POA: Diagnosis not present

## 2021-06-06 ENCOUNTER — Other Ambulatory Visit: Payer: Self-pay | Admitting: Internal Medicine

## 2021-07-11 ENCOUNTER — Other Ambulatory Visit: Payer: Self-pay | Admitting: Cardiology

## 2021-07-12 ENCOUNTER — Other Ambulatory Visit: Payer: Self-pay | Admitting: *Deleted

## 2021-07-12 NOTE — Telephone Encounter (Signed)
Called pt X 2 about Losartan refill. Looks like on Dr. Olin Pia last ov note pt was to be called to see if pt was still taking losartan.  Will try later.

## 2021-07-12 NOTE — Telephone Encounter (Signed)
Your last ov note stated you wanted to know if the patient is still taking Losartan.  Pt is still taking one tablet by mouth ( 50 mg) daily of Losartan.

## 2021-07-12 NOTE — Telephone Encounter (Signed)
This is a [employer] patient and it looks as if Dr.Camitz may have d/c this medication at his last OV

## 2021-07-18 ENCOUNTER — Other Ambulatory Visit: Payer: Self-pay | Admitting: Internal Medicine

## 2021-07-19 ENCOUNTER — Other Ambulatory Visit: Payer: Self-pay | Admitting: Internal Medicine

## 2021-07-23 ENCOUNTER — Other Ambulatory Visit: Payer: Self-pay | Admitting: Internal Medicine

## 2021-07-26 NOTE — Telephone Encounter (Signed)
This is a [place] pt 

## 2021-07-27 ENCOUNTER — Ambulatory Visit (INDEPENDENT_AMBULATORY_CARE_PROVIDER_SITE_OTHER): Payer: PPO

## 2021-07-27 DIAGNOSIS — I255 Ischemic cardiomyopathy: Secondary | ICD-10-CM | POA: Diagnosis not present

## 2021-07-29 LAB — CUP PACEART REMOTE DEVICE CHECK
Battery Remaining Longevity: 41 mo
Battery Voltage: 2.94 V
Brady Statistic AP VP Percent: 72.33 %
Brady Statistic AP VS Percent: 0 %
Brady Statistic AS VP Percent: 27.66 %
Brady Statistic AS VS Percent: 0.01 %
Brady Statistic RA Percent Paced: 72.26 %
Brady Statistic RV Percent Paced: 99.99 %
Date Time Interrogation Session: 20220909002246
Implantable Lead Implant Date: 20180723
Implantable Lead Implant Date: 20180723
Implantable Lead Location: 753859
Implantable Lead Location: 753860
Implantable Lead Model: 3830
Implantable Lead Model: 5076
Implantable Pulse Generator Implant Date: 20180723
Lead Channel Impedance Value: 342 Ohm
Lead Channel Impedance Value: 342 Ohm
Lead Channel Impedance Value: 380 Ohm
Lead Channel Impedance Value: 437 Ohm
Lead Channel Pacing Threshold Amplitude: 0.625 V
Lead Channel Pacing Threshold Amplitude: 1.25 V
Lead Channel Pacing Threshold Pulse Width: 0.4 ms
Lead Channel Pacing Threshold Pulse Width: 0.4 ms
Lead Channel Sensing Intrinsic Amplitude: 1.25 mV
Lead Channel Sensing Intrinsic Amplitude: 1.25 mV
Lead Channel Sensing Intrinsic Amplitude: 7.625 mV
Lead Channel Sensing Intrinsic Amplitude: 9.25 mV
Lead Channel Setting Pacing Amplitude: 2 V
Lead Channel Setting Pacing Amplitude: 2.5 V
Lead Channel Setting Pacing Pulse Width: 0.8 ms
Lead Channel Setting Sensing Sensitivity: 2.8 mV

## 2021-08-04 NOTE — Progress Notes (Signed)
Remote pacemaker transmission.   

## 2021-08-09 DIAGNOSIS — R351 Nocturia: Secondary | ICD-10-CM | POA: Diagnosis not present

## 2021-08-09 DIAGNOSIS — Z23 Encounter for immunization: Secondary | ICD-10-CM | POA: Diagnosis not present

## 2021-08-09 DIAGNOSIS — N1832 Chronic kidney disease, stage 3b: Secondary | ICD-10-CM | POA: Diagnosis not present

## 2021-08-09 DIAGNOSIS — Z125 Encounter for screening for malignant neoplasm of prostate: Secondary | ICD-10-CM | POA: Diagnosis not present

## 2021-08-09 DIAGNOSIS — M109 Gout, unspecified: Secondary | ICD-10-CM | POA: Diagnosis not present

## 2021-08-09 DIAGNOSIS — J4520 Mild intermittent asthma, uncomplicated: Secondary | ICD-10-CM | POA: Diagnosis not present

## 2021-08-09 DIAGNOSIS — I10 Essential (primary) hypertension: Secondary | ICD-10-CM | POA: Diagnosis not present

## 2021-08-09 DIAGNOSIS — M1A9XX0 Chronic gout, unspecified, without tophus (tophi): Secondary | ICD-10-CM | POA: Diagnosis not present

## 2021-08-09 DIAGNOSIS — N400 Enlarged prostate without lower urinary tract symptoms: Secondary | ICD-10-CM | POA: Diagnosis not present

## 2021-08-09 DIAGNOSIS — Z0000 Encounter for general adult medical examination without abnormal findings: Secondary | ICD-10-CM | POA: Diagnosis not present

## 2021-08-09 DIAGNOSIS — E785 Hyperlipidemia, unspecified: Secondary | ICD-10-CM | POA: Diagnosis not present

## 2021-08-09 DIAGNOSIS — R3911 Hesitancy of micturition: Secondary | ICD-10-CM | POA: Diagnosis not present

## 2021-08-10 DIAGNOSIS — Z23 Encounter for immunization: Secondary | ICD-10-CM | POA: Diagnosis not present

## 2021-08-10 DIAGNOSIS — Z125 Encounter for screening for malignant neoplasm of prostate: Secondary | ICD-10-CM | POA: Diagnosis not present

## 2021-08-10 DIAGNOSIS — N400 Enlarged prostate without lower urinary tract symptoms: Secondary | ICD-10-CM | POA: Diagnosis not present

## 2021-08-10 DIAGNOSIS — M109 Gout, unspecified: Secondary | ICD-10-CM | POA: Diagnosis not present

## 2021-08-10 DIAGNOSIS — E785 Hyperlipidemia, unspecified: Secondary | ICD-10-CM | POA: Diagnosis not present

## 2021-08-10 DIAGNOSIS — I10 Essential (primary) hypertension: Secondary | ICD-10-CM | POA: Diagnosis not present

## 2021-08-10 DIAGNOSIS — M1A9XX0 Chronic gout, unspecified, without tophus (tophi): Secondary | ICD-10-CM | POA: Diagnosis not present

## 2021-08-10 DIAGNOSIS — R351 Nocturia: Secondary | ICD-10-CM | POA: Diagnosis not present

## 2021-08-10 DIAGNOSIS — N1832 Chronic kidney disease, stage 3b: Secondary | ICD-10-CM | POA: Diagnosis not present

## 2021-08-10 DIAGNOSIS — J4520 Mild intermittent asthma, uncomplicated: Secondary | ICD-10-CM | POA: Diagnosis not present

## 2021-08-10 DIAGNOSIS — Z0000 Encounter for general adult medical examination without abnormal findings: Secondary | ICD-10-CM | POA: Diagnosis not present

## 2021-08-10 DIAGNOSIS — R3911 Hesitancy of micturition: Secondary | ICD-10-CM | POA: Diagnosis not present

## 2021-08-16 DIAGNOSIS — R7302 Impaired glucose tolerance (oral): Secondary | ICD-10-CM | POA: Diagnosis not present

## 2021-09-14 ENCOUNTER — Other Ambulatory Visit: Payer: Self-pay | Admitting: Internal Medicine

## 2021-10-20 ENCOUNTER — Other Ambulatory Visit: Payer: Self-pay | Admitting: Nephrology

## 2021-10-20 DIAGNOSIS — N1832 Chronic kidney disease, stage 3b: Secondary | ICD-10-CM

## 2021-10-20 DIAGNOSIS — R8290 Unspecified abnormal findings in urine: Secondary | ICD-10-CM | POA: Diagnosis not present

## 2021-10-20 DIAGNOSIS — E785 Hyperlipidemia, unspecified: Secondary | ICD-10-CM | POA: Diagnosis not present

## 2021-10-20 DIAGNOSIS — N189 Chronic kidney disease, unspecified: Secondary | ICD-10-CM

## 2021-10-20 DIAGNOSIS — I10 Essential (primary) hypertension: Secondary | ICD-10-CM | POA: Diagnosis not present

## 2021-10-20 DIAGNOSIS — D631 Anemia in chronic kidney disease: Secondary | ICD-10-CM | POA: Diagnosis not present

## 2021-10-20 DIAGNOSIS — N400 Enlarged prostate without lower urinary tract symptoms: Secondary | ICD-10-CM | POA: Diagnosis not present

## 2021-10-20 DIAGNOSIS — Z950 Presence of cardiac pacemaker: Secondary | ICD-10-CM | POA: Diagnosis not present

## 2021-10-20 DIAGNOSIS — M1000 Idiopathic gout, unspecified site: Secondary | ICD-10-CM | POA: Diagnosis not present

## 2021-10-20 DIAGNOSIS — I97190 Other postprocedural cardiac functional disturbances following cardiac surgery: Secondary | ICD-10-CM | POA: Diagnosis not present

## 2021-11-07 ENCOUNTER — Ambulatory Visit (INDEPENDENT_AMBULATORY_CARE_PROVIDER_SITE_OTHER): Payer: PPO

## 2021-11-07 DIAGNOSIS — I255 Ischemic cardiomyopathy: Secondary | ICD-10-CM

## 2021-11-08 LAB — CUP PACEART REMOTE DEVICE CHECK
Battery Remaining Longevity: 38 mo
Battery Voltage: 2.94 V
Brady Statistic AP VP Percent: 72.3 %
Brady Statistic AP VS Percent: 0 %
Brady Statistic AS VP Percent: 27.69 %
Brady Statistic AS VS Percent: 0.01 %
Brady Statistic RA Percent Paced: 72.22 %
Brady Statistic RV Percent Paced: 99.99 %
Date Time Interrogation Session: 20221216235722
Implantable Lead Implant Date: 20180723
Implantable Lead Implant Date: 20180723
Implantable Lead Location: 753859
Implantable Lead Location: 753860
Implantable Lead Model: 3830
Implantable Lead Model: 5076
Implantable Pulse Generator Implant Date: 20180723
Lead Channel Impedance Value: 323 Ohm
Lead Channel Impedance Value: 342 Ohm
Lead Channel Impedance Value: 380 Ohm
Lead Channel Impedance Value: 494 Ohm
Lead Channel Pacing Threshold Amplitude: 0.75 V
Lead Channel Pacing Threshold Amplitude: 1.25 V
Lead Channel Pacing Threshold Pulse Width: 0.4 ms
Lead Channel Pacing Threshold Pulse Width: 0.4 ms
Lead Channel Sensing Intrinsic Amplitude: 1.125 mV
Lead Channel Sensing Intrinsic Amplitude: 1.125 mV
Lead Channel Sensing Intrinsic Amplitude: 7.625 mV
Lead Channel Sensing Intrinsic Amplitude: 9.25 mV
Lead Channel Setting Pacing Amplitude: 2 V
Lead Channel Setting Pacing Amplitude: 2.5 V
Lead Channel Setting Pacing Pulse Width: 0.8 ms
Lead Channel Setting Sensing Sensitivity: 2.8 mV

## 2021-11-15 ENCOUNTER — Other Ambulatory Visit: Payer: Self-pay | Admitting: Internal Medicine

## 2021-11-17 NOTE — Progress Notes (Signed)
Remote pacemaker transmission.   

## 2021-11-23 DIAGNOSIS — R972 Elevated prostate specific antigen [PSA]: Secondary | ICD-10-CM | POA: Diagnosis not present

## 2021-11-29 ENCOUNTER — Ambulatory Visit (INDEPENDENT_AMBULATORY_CARE_PROVIDER_SITE_OTHER): Payer: PPO | Admitting: Internal Medicine

## 2021-11-29 ENCOUNTER — Other Ambulatory Visit: Payer: Self-pay

## 2021-11-29 ENCOUNTER — Encounter: Payer: Self-pay | Admitting: Internal Medicine

## 2021-11-29 VITALS — BP 80/60 | HR 74 | Ht 70.5 in | Wt 231.0 lb

## 2021-11-29 DIAGNOSIS — Z950 Presence of cardiac pacemaker: Secondary | ICD-10-CM

## 2021-11-29 DIAGNOSIS — I442 Atrioventricular block, complete: Secondary | ICD-10-CM

## 2021-11-29 DIAGNOSIS — I255 Ischemic cardiomyopathy: Secondary | ICD-10-CM | POA: Diagnosis not present

## 2021-11-29 DIAGNOSIS — I5020 Unspecified systolic (congestive) heart failure: Secondary | ICD-10-CM | POA: Diagnosis not present

## 2021-11-29 DIAGNOSIS — I493 Ventricular premature depolarization: Secondary | ICD-10-CM

## 2021-11-29 LAB — PACEMAKER DEVICE OBSERVATION

## 2021-11-29 MED ORDER — LOSARTAN POTASSIUM 25 MG PO TABS: 25.0000 mg | ORAL_TABLET | Freq: Every day | ORAL | 3 refills | Status: DC

## 2021-11-29 NOTE — Patient Instructions (Addendum)
Medication Instructions:  - Your physician has recommended you make the following change in your medication:   1) STOP spironolactone  2) DECREASE losartan to 25 mg: - take 1 tablet by mouth once daily   *If you need a refill on your cardiac medications before your next appointment, please call your pharmacy*   Lab Work: - Your physician recommends that you have lab work today: BMP  If you have labs (blood work) drawn today and your tests are completely normal, you will receive your results only by: [place] (if you have MyChart) OR A paper copy in the mail If you have any lab test that is abnormal or we need to change your treatment, we will call you to review the results.   Testing/Procedures: - none ordered   Follow-Up: At [facility], you and your health needs are our priority.  As part of our continuing mission to provide you with exceptional heart care, we have created designated Provider Care Teams.  These Care Teams include your primary Cardiologist (physician) and Advanced Practice Providers (APPs -  Physician Assistants and Nurse Practitioners) who all work together to provide you with the care you need, when you need it.  We recommend signing up for the patient portal called "MyChart".  Sign up information is provided on this After Visit Summary.  MyChart is used to connect with patients for Virtual Visits (Telemedicine).  Patients are able to view lab/test results, encounter notes, upcoming appointments, etc.  Non-urgent messages can be sent to your provider as well.   To learn more about what you can do with MyChart, go to [email or web address].    Your next appointment:   1) 6 months with Dr. Fletcher Anon  2) 1 year with Dr. Caryl Comes  The format for your next appointment:   In Person  Provider:   As above   Other Instructions N/a

## 2021-11-29 NOTE — Progress Notes (Signed)
Patient Care Team: [name], MD as PCP - General (Family Medicine)   HPI  Sean Derasmo. is a 81 y.o. male Seen in followup for His pacing 4235 complicated lead induced PVCs initially at >9 % .No  EF assessment prior to device implantation   7/19 Cath >> 3V CAD Post cath he developed a left upper extremity DVT and was started on Eliquis >> CABG 9/19 with placing of a LV epicardial lead.  11/19  near syncopal episode Imaging demonstrated a large hemomothorax attributed per the family to a slow leak following his bypass surgery >>  transferred to [facility] and underwent VATS for drainage and decortication.    1/20 found to have persistent Aflutter > 72 hrs and referred to AFib clinic. ECG  Aflutter A -CL-250 msec , ablation 4/21     Developed cardiomyopathy with plan to upgrade to CRT 2/22; however at that hospitalization he was adamant that he had no functional limitations/dyspnea  and with his chronic but stable LV dysfunction, elected to not undertake upgrade  The patient denies chest pain, shortness of breath, nocturnal dyspnea, orthopnea or peripheral edema.  There have been no palpitations or syncope.  Complains of minimal lightheadedness with standing and turning .   DATE TEST EF   9/15 Myoview 55-65% No ischemia  10/18 Echo   50-55 %   5/19 Echo   30-35 %   7/19 CTA  Prob 3 V CAD  7/19 LHC  3V CAD  12/19 Echo  20-25%   3/20 Echo  25-30%    11/20 Echo  35-40%   4/21 TEE 30-35%     Date Cr K Hgb  1/20 1.49 5.0 12.0   4/21 1.59 4.2 13.2  2/22 1.54 4.6 12.4  12/22 2.26 4.8 14.3        Past Medical History:  Diagnosis Date   Arthritis    Asthma    CAD (coronary artery disease)    a. LHC 7/19: ostLM 30%, ostLAD 30%, p-mLAD 99% mod calcified, ostD1 90%, ost-pLCx 80% ulcerative & mod calcified, p-mLCx 50%, OM2 50%, mod dz LPDA, RCA small w/ diffuse dz throughout; b. recommendation of CABG; c. 3-V CABG 08/26/18 (LIMA-LAD, VG-Diag, VG-LCx)   Chronic systolic CHF  (congestive heart failure) (HCC)    a. EF 35-40%, diffuse HK with HK of the apical, anteroseptal, and anterior myocardium, Gr1DD, mild MR, mildly dilated LA, pacer wire noted in the RV with nl RVSF, PASP nl   CKD (chronic kidney disease), stage III ([place])    Deep vein thrombosis (DVT) of upper extremity ([place])    a. DVT of left subclavian dx'd 06/17/2018; b. Eliquis   GERD (gastroesophageal reflux disease)    takes otc occasionally   History of complete heart block    a. s/p MDT PPM 05/2017   Hypertension    Obesity    Presence of permanent cardiac pacemaker    Dr. Beckie Salts implanted 05/2017   PVC's (premature ventricular contractions)    a. PPM-induced    Past Surgical History:  Procedure Laterality Date   A-FLUTTER ABLATION N/A 03/03/2020   Procedure: A-FLUTTER ABLATION;  Surgeon: Deboraha Sprang, MD;  Location: [place] [facility];  Service: Cardiovascular;  Laterality: N/A;   BACK SURGERY     CORONARY ARTERY BYPASS GRAFT N/A 08/26/2018   Procedure: CORONARY ARTERY BYPASS GRAFTING (CABG) x3 , Using left internal mammory artery and right leg greater saphronious vein. Harvested endoscopically.;  Surgeon: Lucianne Lei  Donney Rankins, MD;  Location: [place];  Service: Open Heart Surgery;  Laterality: N/A;   EPICARDIAL PACING LEAD PLACEMENT N/A 08/26/2018   Procedure: LV PACING LEAD PLACEMENT;  Surgeon: Ivin Poot, MD;  Location: [place];  Service: Thoracic;  Laterality: N/A;   EPICARDIAL PACING LEAD PLACEMENT Left 11/07/2018   Procedure: REVISE PACEMAKER LEAD LEFT CHEST;  Surgeon: Ivin Poot, MD;  Location: [place];  Service: Thoracic;  Laterality: Left;   INSERT / REPLACE / [place] CATH AND CORONARY ANGIOGRAPHY N/A 06/13/2018   Procedure: LEFT HEART CATH AND CORONARY ANGIOGRAPHY;  Surgeon: Wellington Hampshire, MD;  Location: [place] [facility];  Service: Cardiovascular;  Laterality: N/A;   LEG SURGERY     PACEMAKER IMPLANT N/A 06/11/2017   Procedure: Pacemaker  Implant;  Surgeon: Evans Lance, MD;  Location: [place] [facility];  Service: Cardiovascular;  Laterality: N/A;   PLEURAL EFFUSION DRAINAGE Left 09/24/2018   Procedure: DRAINAGE OF LOCULATED PLEURAL EFFUSION;  Surgeon: Ivin Poot, MD;  Location: [place];  Service: Thoracic;  Laterality: Left;   TEE WITHOUT CARDIOVERSION N/A 08/26/2018   Procedure: TRANSESOPHAGEAL ECHOCARDIOGRAM (TEE);  Surgeon: Prescott Gum, Collier Salina, MD;  Location: [place];  Service: Open Heart Surgery;  Laterality: N/A;   TEE WITHOUT CARDIOVERSION  03/03/2020   Procedure: Transesophageal Echocardiogram (Tee);  Surgeon: Deboraha Sprang, MD;  Location: [place] [facility];  Service: Cardiovascular;;   VIDEO ASSISTED THORACOSCOPY Left 09/24/2018   Procedure: VIDEO ASSISTED THORACOSCOPY;  Surgeon: Prescott Gum, Collier Salina, MD;  Location: [place];  Service: Thoracic;  Laterality: Left;    Current Outpatient Medications  Medication Sig Dispense Refill   acetaminophen (TYLENOL) 500 MG tablet Take 500 mg by mouth every 6 (six) hours as needed for moderate pain or headache.     allopurinol (ZYLOPRIM) 100 MG tablet Take 100 mg by mouth every other day. At night     atorvastatin (LIPITOR) 40 MG tablet TAKE ONE TABLET EVERY DAY 90 tablet 1   EPINEPHrine (PRIMATENE MIST) 0.125 MG/ACT AERO Inhale 1 puff into the lungs daily as needed (shortness of breath).     losartan (COZAAR) 50 MG tablet TAKE 1 TABLET (50 MG) BY MOUTH EVERY DAY 90 tablet 1   metoprolol succinate (TOPROL-XL) 25 MG 24 hr tablet TAKE 1 TABLET BY MOUTH DAILY 90 tablet 2   Multiple Vitamin (MULTIVITAMIN WITH MINERALS) TABS tablet Take 1 tablet by mouth daily.     Specialty Vitamins Products (PROSTATE PO) Take 1 tablet by mouth at bedtime.      spironolactone (ALDACTONE) 25 MG tablet TAKE 1/2 TABLET EVERYDAY 45 tablet 0   tamsulosin (FLOMAX) 0.4 MG CAPS capsule Take 0.4 mg by mouth daily.     No current facility-administered medications for this visit.    Allergies  Allergen Reactions    Azithromycin Hives   Erythromycin Hives      Review of Systems negative except from HPI and PMH  Physical Exam BP (!) 86/60 (BP Location: Left Arm, Patient Position: Sitting, Cuff Size: Normal)    Pulse 74    Ht 5' 10.5" (1.791 m)    Wt 231 lb (104.8 kg)    SpO2 96%    BMI 32.68 kg/m  Well developed and well nourished in no acute distress HENT normal Neck supple with JVP-flat Clear Device pocket well healed; without hematoma or erythema.  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  The information was reviewed. No changes were made in the programming.     Aflutter persistent-recurrent previously paceterminated  S/p ablation  Hypertension \ HFrEF- chronic  Chronic and worsening renal insufficiency  Hypotension    No intercurrent atrial arrhythmias of note.  Longest duration less than about 20 minutes  Renal function is worsening with an interval change in his creatinine from 1.5-2.26.  His blood pressure is 80+, that we will take the liberty of discontinuing his spironolactone as his GFR CKD-EPI algorithm is about 29 I have spoken with his nephrologist Dr. Lanora Manis who was in agreement and also asked [place] to check Cr today--   PVCs queiscient willcontinue   Pt heart failure status is stable. Continue spiro daily @ 25  mg.  Electrolytes are stable.   Have deferred for now CRT upgrade  K stable 2/22 on aldactone  For his cardiomyopathy, need to clarify whether he is on losartan, we think so and he is not sure so will have to get him to call in; continue metoprolol  succinate 25 aldactone 25, uptitration of which have been limited by hypotension  PVCs also largely quiescient   LDL 9/20 was 47; continue atorvastatin 40

## 2021-11-30 LAB — BASIC METABOLIC PANEL
BUN/Creatinine Ratio: 17 (ref 10–24)
BUN: 33 mg/dL — ABNORMAL HIGH (ref 8–27)
CO2: 26 mmol/L (ref 20–29)
Calcium: 9.7 mg/dL (ref 8.6–10.2)
Chloride: 104 mmol/L (ref 96–106)
Creatinine, Ser: 1.89 mg/dL — ABNORMAL HIGH (ref 0.76–1.27)
Glucose: 149 mg/dL — ABNORMAL HIGH (ref 70–99)
Potassium: 5 mmol/L (ref 3.5–5.2)
Sodium: 147 mmol/L — ABNORMAL HIGH (ref 134–144)
eGFR: 36 mL/min/{1.73_m2} — ABNORMAL LOW (ref >59)

## 2021-12-05 ENCOUNTER — Ambulatory Visit
Admission: RE | Admit: 2021-12-05 | Discharge: 2021-12-05 | Disposition: A | Discharge status: Home / Self Care | Payer: PPO | Source: Ambulatory Visit | Attending: Nephrology | Admitting: Nephrology

## 2021-12-05 ENCOUNTER — Other Ambulatory Visit: Payer: Self-pay

## 2021-12-05 DIAGNOSIS — E785 Hyperlipidemia, unspecified: Secondary | ICD-10-CM | POA: Insufficient documentation

## 2021-12-05 DIAGNOSIS — D631 Anemia in chronic kidney disease: Secondary | ICD-10-CM | POA: Insufficient documentation

## 2021-12-05 DIAGNOSIS — N1832 Chronic kidney disease, stage 3b: Secondary | ICD-10-CM | POA: Diagnosis not present

## 2021-12-05 DIAGNOSIS — R8290 Unspecified abnormal findings in urine: Secondary | ICD-10-CM | POA: Diagnosis not present

## 2021-12-05 DIAGNOSIS — N189 Chronic kidney disease, unspecified: Secondary | ICD-10-CM | POA: Diagnosis not present

## 2021-12-05 DIAGNOSIS — N281 Cyst of kidney, acquired: Secondary | ICD-10-CM | POA: Diagnosis not present

## 2021-12-08 DIAGNOSIS — N1832 Chronic kidney disease, stage 3b: Secondary | ICD-10-CM | POA: Diagnosis not present

## 2021-12-08 DIAGNOSIS — N400 Enlarged prostate without lower urinary tract symptoms: Secondary | ICD-10-CM | POA: Diagnosis not present

## 2021-12-08 DIAGNOSIS — E785 Hyperlipidemia, unspecified: Secondary | ICD-10-CM | POA: Diagnosis not present

## 2021-12-08 DIAGNOSIS — M1000 Idiopathic gout, unspecified site: Secondary | ICD-10-CM | POA: Diagnosis not present

## 2021-12-08 DIAGNOSIS — D631 Anemia in chronic kidney disease: Secondary | ICD-10-CM | POA: Diagnosis not present

## 2021-12-08 DIAGNOSIS — N281 Cyst of kidney, acquired: Secondary | ICD-10-CM | POA: Diagnosis not present

## 2021-12-08 DIAGNOSIS — I97190 Other postprocedural cardiac functional disturbances following cardiac surgery: Secondary | ICD-10-CM | POA: Diagnosis not present

## 2021-12-08 DIAGNOSIS — I10 Essential (primary) hypertension: Secondary | ICD-10-CM | POA: Diagnosis not present

## 2021-12-08 DIAGNOSIS — E1122 Type 2 diabetes mellitus with diabetic chronic kidney disease: Secondary | ICD-10-CM | POA: Diagnosis not present

## 2021-12-08 DIAGNOSIS — Z950 Presence of cardiac pacemaker: Secondary | ICD-10-CM | POA: Diagnosis not present

## 2021-12-15 ENCOUNTER — Telehealth: Payer: Self-pay | Admitting: Internal Medicine

## 2021-12-15 DIAGNOSIS — R7989 Other specified abnormal findings of blood chemistry: Secondary | ICD-10-CM

## 2021-12-15 NOTE — Telephone Encounter (Signed)
Reviewed the patient's chart prior to calling him with his lab results.  Per Dr. Olin Pia office note from 11/29/21: Renal function is worsening with an interval change in his creatinine from 1.5-2.26.  His blood pressure is 80+, that we will take the liberty of discontinuing his spironolactone as his GFR CKD-EPI algorithm is about 29 I have spoken with his nephrologist Dr. Lanora Manis who was in agreement and also asked [place] to check Cr today--      I called and spoke with the patient. He is advised of his lab results from 11/29/21 and voices understanding. I have confirmed with him that he has been off spironolactone since his visit with Dr. Caryl Comes 11/29/21.  The patient confirms he recently saw his nephrologist and had labs done at that visit. He was told "everything looks good." He was advised to follow up in 6 months with nephrology.  Will forward to Dr. Caryl Comes as an Juluis [place]ier.

## 2021-12-15 NOTE — Telephone Encounter (Signed)
Return call to Sean [name], wife wanted to discuss phone call from earlier with Sean [name], as Sean [name] did not understand phone conversion. Wife not on DPR, Sean [name] was able to give verbal consent that wife Sean [name] may speak to this RN regarding Sean [name]'s medical information and results.   Reviewed conversation from earlier with Sean Conn, RN and Sean [name] regarding lab results, Cr elevated, and stopping spironolactone. Wife confirms pt has d/c spironolactone and that Sean [name] has been doing and feeling well.   Sean [name] confirmed f/u appt with Sean [name] with nephrologist 4/27 at 10:40  Per Dr. Caryl Comes notes Please Inform Patient   Labs are normal x improved Cr but Na is too high  please have him stop aldactone and find out when he is scheduled to followup with nephrology And if more than 4 weeks, lets have him come in for Bmet in about 3 weeks  As his nephrologist appt if further out than 4 weeks, recommend repeat BMET in 3 weeks. Both wife and pt verbally agrees for repeat labs.   Lab schedule for 2/16. BMET  Otherwise all questions were address and no additional concerns at this time. Sean [name] and Sean [name] thankful for the return call and advice. Agreeable to plan, will call back for anything further.

## 2021-12-15 NOTE — Telephone Encounter (Signed)
Patient returning call.

## 2021-12-15 NOTE — Telephone Encounter (Signed)
Sean Sprang, MD  12/14/2021  5:48 PM EST     Please Inform Patient   Labs are normal x improved Cr but Na is too high  please have him stop aldactone and find out when he is scheduled to followup with nephrology And if more than 4 weeks, lets have him come in for Bmet in about 3 weeks   Thanks

## 2022-01-05 ENCOUNTER — Other Ambulatory Visit (INDEPENDENT_AMBULATORY_CARE_PROVIDER_SITE_OTHER): Payer: PPO

## 2022-01-05 ENCOUNTER — Other Ambulatory Visit: Payer: Self-pay

## 2022-01-05 DIAGNOSIS — R7989 Other specified abnormal findings of blood chemistry: Secondary | ICD-10-CM

## 2022-01-06 LAB — BASIC METABOLIC PANEL
BUN/Creatinine Ratio: 17 (ref 10–24)
BUN: 27 mg/dL (ref 8–27)
CO2: 27 mmol/L (ref 20–29)
Calcium: 9.5 mg/dL (ref 8.6–10.2)
Chloride: 103 mmol/L (ref 96–106)
Creatinine, Ser: 1.63 mg/dL — ABNORMAL HIGH (ref 0.76–1.27)
Glucose: 83 mg/dL (ref 70–99)
Potassium: 4.7 mmol/L (ref 3.5–5.2)
Sodium: 142 mmol/L (ref 134–144)
eGFR: 43 mL/min/{1.73_m2} — ABNORMAL LOW (ref >59)

## 2022-01-11 ENCOUNTER — Telehealth: Payer: Self-pay | Admitting: *Deleted

## 2022-01-11 DIAGNOSIS — Z961 Presence of intraocular lens: Secondary | ICD-10-CM | POA: Diagnosis not present

## 2022-01-11 DIAGNOSIS — H524 Presbyopia: Secondary | ICD-10-CM | POA: Diagnosis not present

## 2022-01-11 DIAGNOSIS — H26492 Other secondary cataract, left eye: Secondary | ICD-10-CM | POA: Diagnosis not present

## 2022-01-11 DIAGNOSIS — H43811 Vitreous degeneration, right eye: Secondary | ICD-10-CM | POA: Diagnosis not present

## 2022-01-11 NOTE — Telephone Encounter (Signed)
Left voicemail message to call back for review of results.  

## 2022-01-11 NOTE — Telephone Encounter (Signed)
-----   Message from Deboraha Sprang, MD sent at 01/11/2022  2:51 PM EST ----- Please Inform Patient That labs are renal function is stable and indeed a little bit better than last month Thanks

## 2022-01-19 NOTE — Telephone Encounter (Signed)
The patient has been notified of the result and verbalized understanding.  All questions (if any) were answered. ?Alvis Lemmings, RN 01/19/2022 3:23 PM  ? ?

## 2022-01-23 ENCOUNTER — Other Ambulatory Visit: Payer: Self-pay | Admitting: Internal Medicine

## 2022-02-06 ENCOUNTER — Ambulatory Visit (INDEPENDENT_AMBULATORY_CARE_PROVIDER_SITE_OTHER): Payer: PPO

## 2022-02-06 DIAGNOSIS — I255 Ischemic cardiomyopathy: Secondary | ICD-10-CM

## 2022-02-07 LAB — CUP PACEART REMOTE DEVICE CHECK
Battery Remaining Longevity: 33 mo
Battery Voltage: 2.93 V
Brady Statistic AP VP Percent: 68.31 %
Brady Statistic AP VS Percent: 0 %
Brady Statistic AS VP Percent: 31.66 %
Brady Statistic AS VS Percent: 0.02 %
Brady Statistic RA Percent Paced: 68.22 %
Brady Statistic RV Percent Paced: 99.98 %
Date Time Interrogation Session: 20230318001822
Implantable Lead Implant Date: 20180723
Implantable Lead Implant Date: 20180723
Implantable Lead Location: 753859
Implantable Lead Location: 753860
Implantable Lead Model: 3830
Implantable Lead Model: 5076
Implantable Pulse Generator Implant Date: 20180723
Lead Channel Impedance Value: 323 Ohm
Lead Channel Impedance Value: 342 Ohm
Lead Channel Impedance Value: 380 Ohm
Lead Channel Impedance Value: 513 Ohm
Lead Channel Pacing Threshold Amplitude: 0.75 V
Lead Channel Pacing Threshold Amplitude: 1.25 V
Lead Channel Pacing Threshold Pulse Width: 0.4 ms
Lead Channel Pacing Threshold Pulse Width: 0.4 ms
Lead Channel Sensing Intrinsic Amplitude: 1.25 mV
Lead Channel Sensing Intrinsic Amplitude: 1.25 mV
Lead Channel Sensing Intrinsic Amplitude: 7.625 mV
Lead Channel Sensing Intrinsic Amplitude: 9.25 mV
Lead Channel Setting Pacing Amplitude: 2 V
Lead Channel Setting Pacing Amplitude: 2.5 V
Lead Channel Setting Pacing Pulse Width: 0.8 ms
Lead Channel Setting Sensing Sensitivity: 2.8 mV

## 2022-02-16 NOTE — Progress Notes (Signed)
Remote pacemaker transmission.   

## 2022-02-22 DIAGNOSIS — R972 Elevated prostate specific antigen [PSA]: Secondary | ICD-10-CM | POA: Diagnosis not present

## 2022-03-06 ENCOUNTER — Other Ambulatory Visit: Payer: Self-pay | Admitting: Internal Medicine

## 2022-03-15 DIAGNOSIS — Z950 Presence of cardiac pacemaker: Secondary | ICD-10-CM | POA: Diagnosis not present

## 2022-03-15 DIAGNOSIS — I10 Essential (primary) hypertension: Secondary | ICD-10-CM | POA: Diagnosis not present

## 2022-03-15 DIAGNOSIS — R8290 Unspecified abnormal findings in urine: Secondary | ICD-10-CM | POA: Diagnosis not present

## 2022-03-15 DIAGNOSIS — N400 Enlarged prostate without lower urinary tract symptoms: Secondary | ICD-10-CM | POA: Diagnosis not present

## 2022-03-15 DIAGNOSIS — D631 Anemia in chronic kidney disease: Secondary | ICD-10-CM | POA: Diagnosis not present

## 2022-03-15 DIAGNOSIS — E1122 Type 2 diabetes mellitus with diabetic chronic kidney disease: Secondary | ICD-10-CM | POA: Diagnosis not present

## 2022-03-15 DIAGNOSIS — M1000 Idiopathic gout, unspecified site: Secondary | ICD-10-CM | POA: Diagnosis not present

## 2022-03-15 DIAGNOSIS — N1832 Chronic kidney disease, stage 3b: Secondary | ICD-10-CM | POA: Diagnosis not present

## 2022-03-15 DIAGNOSIS — E785 Hyperlipidemia, unspecified: Secondary | ICD-10-CM | POA: Diagnosis not present

## 2022-03-15 DIAGNOSIS — I97190 Other postprocedural cardiac functional disturbances following cardiac surgery: Secondary | ICD-10-CM | POA: Diagnosis not present

## 2022-05-08 ENCOUNTER — Ambulatory Visit (INDEPENDENT_AMBULATORY_CARE_PROVIDER_SITE_OTHER): Payer: PPO

## 2022-05-08 DIAGNOSIS — I255 Ischemic cardiomyopathy: Secondary | ICD-10-CM

## 2022-05-10 LAB — CUP PACEART REMOTE DEVICE CHECK
Battery Remaining Longevity: 30 mo
Battery Voltage: 2.93 V
Brady Statistic AP VP Percent: 71.32 %
Brady Statistic AP VS Percent: 0 %
Brady Statistic AS VP Percent: 28.66 %
Brady Statistic AS VS Percent: 0.02 %
Brady Statistic RA Percent Paced: 71.27 %
Brady Statistic RV Percent Paced: 99.98 %
Date Time Interrogation Session: 20230619002141
Implantable Lead Implant Date: 20180723
Implantable Lead Implant Date: 20180723
Implantable Lead Location: 753859
Implantable Lead Location: 753860
Implantable Lead Model: 3830
Implantable Lead Model: 5076
Implantable Pulse Generator Implant Date: 20180723
Lead Channel Impedance Value: 342 Ohm
Lead Channel Impedance Value: 342 Ohm
Lead Channel Impedance Value: 399 Ohm
Lead Channel Impedance Value: 532 Ohm
Lead Channel Pacing Threshold Amplitude: 0.75 V
Lead Channel Pacing Threshold Amplitude: 1.25 V
Lead Channel Pacing Threshold Pulse Width: 0.4 ms
Lead Channel Pacing Threshold Pulse Width: 0.4 ms
Lead Channel Sensing Intrinsic Amplitude: 1.25 mV
Lead Channel Sensing Intrinsic Amplitude: 1.25 mV
Lead Channel Sensing Intrinsic Amplitude: 7.625 mV
Lead Channel Sensing Intrinsic Amplitude: 9.25 mV
Lead Channel Setting Pacing Amplitude: 2 V
Lead Channel Setting Pacing Amplitude: 2.5 V
Lead Channel Setting Pacing Pulse Width: 0.8 ms
Lead Channel Setting Sensing Sensitivity: 2.8 mV

## 2022-05-30 NOTE — Progress Notes (Signed)
Remote pacemaker transmission.   

## 2022-07-20 DIAGNOSIS — D631 Anemia in chronic kidney disease: Secondary | ICD-10-CM | POA: Diagnosis not present

## 2022-07-20 DIAGNOSIS — N400 Enlarged prostate without lower urinary tract symptoms: Secondary | ICD-10-CM | POA: Diagnosis not present

## 2022-07-20 DIAGNOSIS — I97190 Other postprocedural cardiac functional disturbances following cardiac surgery: Secondary | ICD-10-CM | POA: Diagnosis not present

## 2022-07-20 DIAGNOSIS — R8290 Unspecified abnormal findings in urine: Secondary | ICD-10-CM | POA: Diagnosis not present

## 2022-07-20 DIAGNOSIS — M1000 Idiopathic gout, unspecified site: Secondary | ICD-10-CM | POA: Diagnosis not present

## 2022-07-20 DIAGNOSIS — E1122 Type 2 diabetes mellitus with diabetic chronic kidney disease: Secondary | ICD-10-CM | POA: Diagnosis not present

## 2022-07-20 DIAGNOSIS — Z950 Presence of cardiac pacemaker: Secondary | ICD-10-CM | POA: Diagnosis not present

## 2022-07-20 DIAGNOSIS — I10 Essential (primary) hypertension: Secondary | ICD-10-CM | POA: Diagnosis not present

## 2022-07-20 DIAGNOSIS — N1832 Chronic kidney disease, stage 3b: Secondary | ICD-10-CM | POA: Diagnosis not present

## 2022-07-20 DIAGNOSIS — E785 Hyperlipidemia, unspecified: Secondary | ICD-10-CM | POA: Diagnosis not present

## 2022-07-25 DIAGNOSIS — E1122 Type 2 diabetes mellitus with diabetic chronic kidney disease: Secondary | ICD-10-CM | POA: Diagnosis not present

## 2022-07-25 DIAGNOSIS — R8290 Unspecified abnormal findings in urine: Secondary | ICD-10-CM | POA: Diagnosis not present

## 2022-07-25 DIAGNOSIS — D631 Anemia in chronic kidney disease: Secondary | ICD-10-CM | POA: Diagnosis not present

## 2022-07-25 DIAGNOSIS — I97190 Other postprocedural cardiac functional disturbances following cardiac surgery: Secondary | ICD-10-CM | POA: Diagnosis not present

## 2022-07-25 DIAGNOSIS — E785 Hyperlipidemia, unspecified: Secondary | ICD-10-CM | POA: Diagnosis not present

## 2022-07-25 DIAGNOSIS — N1832 Chronic kidney disease, stage 3b: Secondary | ICD-10-CM | POA: Diagnosis not present

## 2022-07-25 DIAGNOSIS — M1000 Idiopathic gout, unspecified site: Secondary | ICD-10-CM | POA: Diagnosis not present

## 2022-07-25 DIAGNOSIS — N400 Enlarged prostate without lower urinary tract symptoms: Secondary | ICD-10-CM | POA: Diagnosis not present

## 2022-07-25 DIAGNOSIS — I10 Essential (primary) hypertension: Secondary | ICD-10-CM | POA: Diagnosis not present

## 2022-07-25 DIAGNOSIS — Z950 Presence of cardiac pacemaker: Secondary | ICD-10-CM | POA: Diagnosis not present

## 2022-08-07 ENCOUNTER — Ambulatory Visit (INDEPENDENT_AMBULATORY_CARE_PROVIDER_SITE_OTHER): Payer: PPO

## 2022-08-07 DIAGNOSIS — I442 Atrioventricular block, complete: Secondary | ICD-10-CM

## 2022-08-08 LAB — CUP PACEART REMOTE DEVICE CHECK
Battery Remaining Longevity: 27 mo
Battery Voltage: 2.92 V
Brady Statistic AP VP Percent: 68.39 %
Brady Statistic AP VS Percent: 0.01 %
Brady Statistic AS VP Percent: 31.53 %
Brady Statistic AS VS Percent: 0.08 %
Brady Statistic RA Percent Paced: 68.39 %
Brady Statistic RV Percent Paced: 99.92 %
Date Time Interrogation Session: 20230918002557
Implantable Lead Implant Date: 20180723
Implantable Lead Implant Date: 20180723
Implantable Lead Location: 753859
Implantable Lead Location: 753860
Implantable Lead Model: 3830
Implantable Lead Model: 5076
Implantable Pulse Generator Implant Date: 20180723
Lead Channel Impedance Value: 323 Ohm
Lead Channel Impedance Value: 342 Ohm
Lead Channel Impedance Value: 361 Ohm
Lead Channel Impedance Value: 456 Ohm
Lead Channel Pacing Threshold Amplitude: 0.75 V
Lead Channel Pacing Threshold Amplitude: 1.25 V
Lead Channel Pacing Threshold Pulse Width: 0.4 ms
Lead Channel Pacing Threshold Pulse Width: 0.4 ms
Lead Channel Sensing Intrinsic Amplitude: 0.875 mV
Lead Channel Sensing Intrinsic Amplitude: 0.875 mV
Lead Channel Sensing Intrinsic Amplitude: 4.875 mV
Lead Channel Sensing Intrinsic Amplitude: 4.875 mV
Lead Channel Setting Pacing Amplitude: 2 V
Lead Channel Setting Pacing Amplitude: 2.5 V
Lead Channel Setting Pacing Pulse Width: 0.8 ms
Lead Channel Setting Sensing Sensitivity: 2.8 mV

## 2022-08-22 NOTE — Progress Notes (Signed)
Remote pacemaker transmission.   

## 2022-09-04 ENCOUNTER — Other Ambulatory Visit: Payer: Self-pay | Admitting: Internal Medicine

## 2022-09-06 ENCOUNTER — Other Ambulatory Visit: Payer: Self-pay | Admitting: Internal Medicine

## 2022-10-10 ENCOUNTER — Ambulatory Visit: Payer: PPO | Attending: Cardiovascular Disease | Admitting: Cardiovascular Disease

## 2022-10-10 ENCOUNTER — Encounter: Payer: Self-pay | Admitting: Cardiovascular Disease

## 2022-10-10 VITALS — BP 110/60 | HR 71 | Ht 70.5 in | Wt 238.1 lb

## 2022-10-10 DIAGNOSIS — I2510 Atherosclerotic heart disease of native coronary artery without angina pectoris: Secondary | ICD-10-CM | POA: Diagnosis not present

## 2022-10-10 DIAGNOSIS — E785 Hyperlipidemia, unspecified: Secondary | ICD-10-CM | POA: Diagnosis not present

## 2022-10-10 DIAGNOSIS — I5022 Chronic systolic (congestive) heart failure: Secondary | ICD-10-CM

## 2022-10-10 MED ORDER — ASPIRIN 81 MG PO TBEC: 81.0000 mg | DELAYED_RELEASE_TABLET | Freq: Every day | ORAL | 3 refills | Status: DC

## 2022-10-10 MED ORDER — METOPROLOL SUCCINATE ER 100 MG PO TB24: 100.0000 mg | ORAL_TABLET | Freq: Every day | ORAL | 1 refills | Status: DC

## 2022-10-10 MED ORDER — METOPROLOL SUCCINATE ER 25 MG PO TB24: 25.0000 mg | ORAL_TABLET | Freq: Every day | ORAL | 1 refills | Status: DC

## 2022-10-10 NOTE — Patient Instructions (Signed)
Medication Instructions:  START Aspirin 81 mg once daily  *If you need a refill on your cardiac medications before your next appointment, please call your pharmacy*   Lab Work: None ordered If you have labs (blood work) drawn today and your tests are completely normal, you will receive your results only by: [place] (if you have MyChart) OR A paper copy in the mail If you have any lab test that is abnormal or we need to change your treatment, we will call you to review the results.   Testing/Procedures: None ordered   Follow-Up: At [facility], you and your health needs are our priority.  As part of our continuing mission to provide you with exceptional heart care, we have created designated Provider Care Teams.  These Care Teams include your primary Cardiologist (physician) and Advanced Practice Providers (APPs -  Physician Assistants and Nurse Practitioners) who all work together to provide you with the care you need, when you need it.  We recommend signing up for the patient portal called "MyChart".  Sign up information is provided on this After Visit Summary.  MyChart is used to connect with patients for Virtual Visits (Telemedicine).  Patients are able to view lab/test results, encounter notes, upcoming appointments, etc.  Non-urgent messages can be sent to your provider as well.   To learn more about what you can do with MyChart, go to [email or web address].    Your next appointment:   6 month(s)  The format for your next appointment:   In Person  Provider:   You may see Dr. Fletcher Anon or one of the following Advanced Practice Providers on your designated Care Team:   Murray Hodgkins, NP Christell Faith, PA-C Cadence Kathlen Mody, PA-C Gerrie Nordmann, NP

## 2022-10-10 NOTE — Progress Notes (Unsigned)
Cardiology Office Note   Date:  10/10/2022   ID:  Sean Devon., DOB Sep 19, 1941, MRN [id number]  PCP:  [name], MD  Cardiologist: Dr. Caryl Comes  Chief Complaint  Patient presents with   Other    No complaints today. Meds reviewed verbally with pt.      History of Present Illness: Sean Mcdiarmid. is a 81 y.o. male who is here today for follow-up visit regarding coronary artery disease and chronic systolic heart failure.   The patient has known history of pacemaker placement and PVCs.  Other medical problems include stage III chronic kidney disease, atrial flutter, hypertension and obesity.  He had dual-chamber pacemaker placement  in the setting of third-degree AV block. He had an echocardiogram done in May of 2019 which showed an EF of 35 to 40% with diffuse hypokinesis, mild mitral regurgitation and mildly dilated left atrium.  Due to this cardiomyopathy, he underwent CTA of the coronary arteries which showed evidence of three-vessel coronary artery disease with a left dominant system.  FFR was less than 0.6 in all vessels. I proceeded with cardiac catheterization in July 2019 which showed left dominant coronary arteries with severe heavily calcified three-vessel coronary artery disease including subtotal occlusion of the mid LAD and ulcerated plaque in the proximal left circumflex.  He underwent CABG at [facility] He underwent atrial flutter ablation in April 2021.  Given low EF, the plan was to switch his pacemaker to CRT at some point but given that the patient is a [place] heart association class I, that was not done. He has been doing very well with no recent chest pain, shortness of breath or palpitations.  Past Medical History:  Diagnosis Date   Arthritis    Asthma    CAD (coronary artery disease)    a. LHC 7/19: ostLM 30%, ostLAD 30%, p-mLAD 99% mod calcified, ostD1 90%, ost-pLCx 80% ulcerative & mod calcified, p-mLCx 50%, OM2 50%, mod dz LPDA, RCA small w/  diffuse dz throughout; b. recommendation of CABG; c. 3-V CABG 08/26/18 (LIMA-LAD, VG-Diag, VG-LCx)   Chronic systolic CHF (congestive heart failure) (HCC)    a. EF 35-40%, diffuse HK with HK of the apical, anteroseptal, and anterior myocardium, Gr1DD, mild MR, mildly dilated LA, pacer wire noted in the RV with nl RVSF, PASP nl   CKD (chronic kidney disease), stage III ([place])    Deep vein thrombosis (DVT) of upper extremity ([place])    a. DVT of left subclavian dx'd 06/17/2018; b. Eliquis   GERD (gastroesophageal reflux disease)    takes otc occasionally   History of complete heart block    a. s/p MDT PPM 05/2017   Hypertension    Obesity    Presence of permanent cardiac pacemaker    Dr. Beckie Salts implanted 05/2017   PVC's (premature ventricular contractions)    a. PPM-induced    Past Surgical History:  Procedure Laterality Date   A-FLUTTER ABLATION N/A 03/03/2020   Procedure: A-FLUTTER ABLATION;  Surgeon: Deboraha Sprang, MD;  Location: [place] [facility];  Service: Cardiovascular;  Laterality: N/A;   BACK SURGERY     CORONARY ARTERY BYPASS GRAFT N/A 08/26/2018   Procedure: CORONARY ARTERY BYPASS GRAFTING (CABG) x3 , Using left internal mammory artery and right leg greater saphronious vein. Harvested endoscopically.;  Surgeon: Ivin Poot, MD;  Location: [place];  Service: Open Heart Surgery;  Laterality: N/A;   EPICARDIAL PACING LEAD PLACEMENT N/A 08/26/2018   Procedure:  LV PACING LEAD PLACEMENT;  Surgeon: Ivin Poot, MD;  Location: [place];  Service: Thoracic;  Laterality: N/A;   EPICARDIAL PACING LEAD PLACEMENT Left 11/07/2018   Procedure: REVISE PACEMAKER LEAD LEFT CHEST;  Surgeon: Ivin Poot, MD;  Location: [place];  Service: Thoracic;  Laterality: Left;   INSERT / REPLACE / [place] CATH AND CORONARY ANGIOGRAPHY N/A 06/13/2018   Procedure: LEFT HEART CATH AND CORONARY ANGIOGRAPHY;  Surgeon: Wellington Hampshire, MD;  Location: [place] [facility];   Service: Cardiovascular;  Laterality: N/A;   LEG SURGERY     PACEMAKER IMPLANT N/A 06/11/2017   Procedure: Pacemaker Implant;  Surgeon: Evans Lance, MD;  Location: [place] [facility];  Service: Cardiovascular;  Laterality: N/A;   PLEURAL EFFUSION DRAINAGE Left 09/24/2018   Procedure: DRAINAGE OF LOCULATED PLEURAL EFFUSION;  Surgeon: Ivin Poot, MD;  Location: [place];  Service: Thoracic;  Laterality: Left;   TEE WITHOUT CARDIOVERSION N/A 08/26/2018   Procedure: TRANSESOPHAGEAL ECHOCARDIOGRAM (TEE);  Surgeon: Prescott Gum, Collier Salina, MD;  Location: [place];  Service: Open Heart Surgery;  Laterality: N/A;   TEE WITHOUT CARDIOVERSION  03/03/2020   Procedure: Transesophageal Echocardiogram (Tee);  Surgeon: Deboraha Sprang, MD;  Location: [place] [facility];  Service: Cardiovascular;;   VIDEO ASSISTED THORACOSCOPY Left 09/24/2018   Procedure: VIDEO ASSISTED THORACOSCOPY;  Surgeon: Prescott Gum, Collier Salina, MD;  Location: [place];  Service: Thoracic;  Laterality: Left;     Current Outpatient Medications  Medication Sig Dispense Refill   acetaminophen (TYLENOL) 500 MG tablet Take 500 mg by mouth every 6 (six) hours as needed for moderate pain or headache.     allopurinol (ZYLOPRIM) 100 MG tablet Take 100 mg by mouth every other day. At night     atorvastatin (LIPITOR) 40 MG tablet TAKE ONE TABLET EVERY DAY 90 tablet 0   EPINEPHrine (PRIMATENE MIST) 0.125 MG/ACT AERO Inhale 1 puff into the lungs daily as needed (shortness of breath).     losartan (COZAAR) 25 MG tablet TAKE ONE TABLET (25 MG) BY MOUTH EVERY DAY 90 tablet 0   Multiple Vitamin (MULTIVITAMIN WITH MINERALS) TABS tablet Take 1 tablet by mouth daily.     Specialty Vitamins Products (PROSTATE PO) Take 1 tablet by mouth at bedtime.      tamsulosin (FLOMAX) 0.4 MG CAPS capsule Take 0.4 mg by mouth daily.     metoprolol succinate (TOPROL-XL) 100 MG 24 hr tablet Take 1 tablet (100 mg total) by mouth daily. 90 tablet 1   No current facility-administered  medications for this visit.    Allergies:   Azithromycin and Erythromycin    Social History:  The patient  reports that he has never smoked. He has never used smokeless tobacco. He reports current alcohol use. He reports that he does not use drugs.   Family History:  The patient's family history includes Alcohol abuse in his brother and father; Diabetes in his brother; Stroke in his father, maternal grandfather, and mother.    ROS:  Please see the history of present illness.   Otherwise, review of systems are positive for none.   All other systems are reviewed and negative.    PHYSICAL EXAM: VS:  BP 110/60 (BP Location: Left Arm, Patient Position: Sitting, Cuff Size: Normal)   Pulse 71   Ht 5' 10.5" (1.791 m)   Wt 238 lb 2 oz (108 kg)   SpO2 98%   BMI  33.68 kg/m  , BMI Body mass index is 33.68 kg/m. GEN: Well nourished, well developed, in no acute distress  HEENT: normal  Neck: no JVD, carotid bruits, or masses Cardiac: RRR; no murmurs, rubs, or gallops,no edema  Respiratory:  clear to auscultation bilaterally, normal work of breathing GI: soft, nontender, nondistended, + BS MS: no deformity or atrophy  Skin: warm and dry, no rash Neuro:  Strength and sensation are intact Psych: euthymic mood, full affect Radial pulses normal.  EKG:  EKG is ordered today. The ekg ordered today demonstrates AV sensed ventricular paced rhythm.   Recent Labs: 01/05/2022: BUN 27; Creatinine, Ser 1.63; Potassium 4.7; Sodium 142    Lipid Panel    Component Value Date/Time   CHOL 116 06/26/2018 0605   TRIG 64 06/26/2018 0605   HDL 43 06/26/2018 0605   CHOLHDL 2.7 06/26/2018 0605   VLDL 13 06/26/2018 0605   LDLCALC 60 06/26/2018 0605      Wt Readings from Last 3 Encounters:  10/10/22 238 lb 2 oz (108 kg)  11/29/21 231 lb (104.8 kg)  05/16/21 228 lb 3.2 oz (103.5 kg)          No data to display            ASSESSMENT AND PLAN:  1.  Coronary artery disease involving native  coronary arteries without angina: He is doing well post CABG with no anginal symptoms.  I asked him to start taking aspirin 81 mg once daily.    2.  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  He is currently [place] heart association class I with no evidence of volume overload.  Continue treatment with Toprol, losartan and spironolactone.  He does have underlying chronic kidney disease which has been stable.  Advancing heart failure medications has been limited by hypotension.  3.  Hyperlipidemia: Continue treatment with atorvastatin.  I reviewed most recent lipid profile done in 2022 which showed an LDL of 47.  4.  Atrial flutter: Status post ablation.  No evidence of recurrent arrhythmia.  5.  Status post pacemaker placement: Followed by Dr. Caryl Comes.    Disposition:   FU with me in 6 months  Signed,  Kathlyn Sacramento, MD  10/10/2022 3:41 PM    [place]

## 2022-11-06 ENCOUNTER — Ambulatory Visit (INDEPENDENT_AMBULATORY_CARE_PROVIDER_SITE_OTHER): Payer: PPO

## 2022-11-06 DIAGNOSIS — I442 Atrioventricular block, complete: Secondary | ICD-10-CM

## 2022-11-07 LAB — CUP PACEART REMOTE DEVICE CHECK
Battery Remaining Longevity: 24 mo
Battery Voltage: 2.91 V
Brady Statistic AP VP Percent: 69.96 %
Brady Statistic AP VS Percent: 0 %
Brady Statistic AS VP Percent: 30.04 %
Brady Statistic AS VS Percent: 0.01 %
Brady Statistic RA Percent Paced: 69.91 %
Brady Statistic RV Percent Paced: 99.99 %
Date Time Interrogation Session: 20231219112511
Implantable Lead Connection Status: 753985
Implantable Lead Connection Status: 753985
Implantable Lead Implant Date: 20180723
Implantable Lead Implant Date: 20180723
Implantable Lead Location: 753859
Implantable Lead Location: 753860
Implantable Lead Model: 3830
Implantable Lead Model: 5076
Implantable Pulse Generator Implant Date: 20180723
Lead Channel Impedance Value: 342 Ohm
Lead Channel Impedance Value: 342 Ohm
Lead Channel Impedance Value: 380 Ohm
Lead Channel Impedance Value: 456 Ohm
Lead Channel Pacing Threshold Amplitude: 0.75 V
Lead Channel Pacing Threshold Amplitude: 1.25 V
Lead Channel Pacing Threshold Pulse Width: 0.4 ms
Lead Channel Pacing Threshold Pulse Width: 0.4 ms
Lead Channel Sensing Intrinsic Amplitude: 1 mV
Lead Channel Sensing Intrinsic Amplitude: 1 mV
Lead Channel Sensing Intrinsic Amplitude: 4.875 mV
Lead Channel Sensing Intrinsic Amplitude: 4.875 mV
Lead Channel Setting Pacing Amplitude: 2 V
Lead Channel Setting Pacing Amplitude: 2.5 V
Lead Channel Setting Pacing Pulse Width: 0.8 ms
Lead Channel Setting Sensing Sensitivity: 2.8 mV
Zone Setting Status: 755011
Zone Setting Status: 755011

## 2022-11-09 DIAGNOSIS — M1A09X0 Idiopathic chronic gout, multiple sites, without tophus (tophi): Secondary | ICD-10-CM | POA: Diagnosis not present

## 2022-11-09 DIAGNOSIS — E119 Type 2 diabetes mellitus without complications: Secondary | ICD-10-CM | POA: Diagnosis not present

## 2022-11-09 DIAGNOSIS — Z125 Encounter for screening for malignant neoplasm of prostate: Secondary | ICD-10-CM | POA: Diagnosis not present

## 2022-11-09 DIAGNOSIS — R351 Nocturia: Secondary | ICD-10-CM | POA: Diagnosis not present

## 2022-11-09 DIAGNOSIS — E1122 Type 2 diabetes mellitus with diabetic chronic kidney disease: Secondary | ICD-10-CM | POA: Diagnosis not present

## 2022-11-09 DIAGNOSIS — Z0000 Encounter for general adult medical examination without abnormal findings: Secondary | ICD-10-CM | POA: Diagnosis not present

## 2022-11-09 DIAGNOSIS — J4520 Mild intermittent asthma, uncomplicated: Secondary | ICD-10-CM | POA: Diagnosis not present

## 2022-11-09 DIAGNOSIS — N1832 Chronic kidney disease, stage 3b: Secondary | ICD-10-CM | POA: Diagnosis not present

## 2022-11-09 DIAGNOSIS — N400 Enlarged prostate without lower urinary tract symptoms: Secondary | ICD-10-CM | POA: Diagnosis not present

## 2022-11-09 DIAGNOSIS — I10 Essential (primary) hypertension: Secondary | ICD-10-CM | POA: Diagnosis not present

## 2022-11-09 DIAGNOSIS — E785 Hyperlipidemia, unspecified: Secondary | ICD-10-CM | POA: Diagnosis not present

## 2022-12-05 ENCOUNTER — Encounter: Payer: PPO | Admitting: Internal Medicine

## 2022-12-07 ENCOUNTER — Other Ambulatory Visit: Payer: Self-pay | Admitting: Internal Medicine

## 2022-12-13 DIAGNOSIS — Z950 Presence of cardiac pacemaker: Secondary | ICD-10-CM | POA: Diagnosis not present

## 2022-12-13 DIAGNOSIS — N281 Cyst of kidney, acquired: Secondary | ICD-10-CM | POA: Diagnosis not present

## 2022-12-13 DIAGNOSIS — D631 Anemia in chronic kidney disease: Secondary | ICD-10-CM | POA: Diagnosis not present

## 2022-12-13 DIAGNOSIS — N1832 Chronic kidney disease, stage 3b: Secondary | ICD-10-CM | POA: Diagnosis not present

## 2022-12-13 DIAGNOSIS — M1000 Idiopathic gout, unspecified site: Secondary | ICD-10-CM | POA: Diagnosis not present

## 2022-12-13 DIAGNOSIS — R8290 Unspecified abnormal findings in urine: Secondary | ICD-10-CM | POA: Diagnosis not present

## 2022-12-13 DIAGNOSIS — I97190 Other postprocedural cardiac functional disturbances following cardiac surgery: Secondary | ICD-10-CM | POA: Diagnosis not present

## 2022-12-13 DIAGNOSIS — E1122 Type 2 diabetes mellitus with diabetic chronic kidney disease: Secondary | ICD-10-CM | POA: Diagnosis not present

## 2022-12-13 DIAGNOSIS — I10 Essential (primary) hypertension: Secondary | ICD-10-CM | POA: Diagnosis not present

## 2022-12-13 DIAGNOSIS — N400 Enlarged prostate without lower urinary tract symptoms: Secondary | ICD-10-CM | POA: Diagnosis not present

## 2022-12-13 DIAGNOSIS — E785 Hyperlipidemia, unspecified: Secondary | ICD-10-CM | POA: Diagnosis not present

## 2022-12-13 NOTE — Progress Notes (Signed)
Remote pacemaker transmission.   

## 2022-12-18 ENCOUNTER — Encounter: Payer: Self-pay | Admitting: Internal Medicine

## 2022-12-18 ENCOUNTER — Ambulatory Visit: Payer: PPO | Attending: Internal Medicine | Admitting: Internal Medicine

## 2022-12-18 VITALS — BP 100/58 | HR 72 | Ht 70.5 in | Wt 237.0 lb

## 2022-12-18 DIAGNOSIS — I442 Atrioventricular block, complete: Secondary | ICD-10-CM | POA: Diagnosis not present

## 2022-12-18 DIAGNOSIS — I5022 Chronic systolic (congestive) heart failure: Secondary | ICD-10-CM

## 2022-12-18 DIAGNOSIS — Z950 Presence of cardiac pacemaker: Secondary | ICD-10-CM

## 2022-12-18 DIAGNOSIS — I255 Ischemic cardiomyopathy: Secondary | ICD-10-CM | POA: Diagnosis not present

## 2022-12-18 LAB — PACEMAKER DEVICE OBSERVATION

## 2022-12-18 NOTE — Progress Notes (Signed)
Patient Care Team: [name], MD as PCP - General (Family Medicine)   HPI  Sean [name]. is a 82 y.o. male Seen in followup for Medtronic His pacing 5573 complicated lead induced PVCs initially at >9 % .No  EF assessment prior to device implantation   7/19 Cath >> 3V CAD Post cath he developed a left upper extremity DVT and was started on Eliquis >> CABG 9/19 with placing of a LV epicardial lead not incorporated .  11/19  near syncopal episode Imaging demonstrated a large hemomothorax attributed per the family to a slow leak following his bypass surgery >>  transferred to [facility] and underwent VATS for drainage and decortication.    1/20 found to have persistent Aflutter > 72 hrs and referred to AFib clinic. ECG  Aflutter A -CL-250 msec , ablation 4/21     Developed cardiomyopathy with plan to upgrade to CRT 2/22; however at that hospitalization he was adamant that he had no functional limitations/dyspnea  and with his chronic but stable LV dysfunction, elected to not undertake upgrade  The patient denies chest pain, shortness of breath, nocturnal dyspnea, orthopnea or peripheral edema.  There have been no palpitations, lightheadedness or syncope.     DATE TEST EF   9/15 Myoview 55-65% No ischemia  10/18 Echo   50-55 %   5/19 Echo   30-35 %   7/19 CTA  Prob 3 V CAD  7/19 LHC  3V CAD  12/19 Echo  20-25%   3/20 Echo  25-30%    11/20 Echo  35-40%   4/21 TEE 30-35%     Date Cr K Hgb  1/20 1.49 5.0 12.0   4/21 1.59 4.2 13.2  2/22 1.54 4.6 12.4  12/22 2.26 4.8 14.3  8/23 1.96 4.8   1/24 1.8 4.3 22.0    Thromboembolic risk factors ( age -34, HTN-1, Vasc disease -1, CHF-1) for a CHADSVASc Score of>=5     Past Medical History:  Diagnosis Date   Arthritis    Asthma    CAD (coronary artery disease)    a. LHC 7/19: ostLM 30%, ostLAD 30%, p-mLAD 99% mod calcified, ostD1 90%, ost-pLCx 80% ulcerative & mod calcified, p-mLCx 50%, OM2 50%, mod dz LPDA, RCA small w/  diffuse dz throughout; b. recommendation of CABG; c. 3-V CABG 08/26/18 (LIMA-LAD, VG-Diag, VG-LCx)   Chronic systolic CHF (congestive heart failure) (HCC)    a. EF 35-40%, diffuse HK with HK of the apical, anteroseptal, and anterior myocardium, Gr1DD, mild MR, mildly dilated LA, pacer wire noted in the RV with nl RVSF, PASP nl   CKD (chronic kidney disease), stage III ([place])    Deep vein thrombosis (DVT) of upper extremity ([place])    a. DVT of left subclavian dx'd 06/17/2018; b. Eliquis   GERD (gastroesophageal reflux disease)    takes otc occasionally   History of complete heart block    a. s/p MDT PPM 05/2017   Hypertension    Obesity    Presence of permanent cardiac pacemaker    Dr. Beckie Salts implanted 05/2017   PVC's (premature ventricular contractions)    a. PPM-induced    Past Surgical History:  Procedure Laterality Date   A-FLUTTER ABLATION N/A 03/03/2020   Procedure: A-FLUTTER ABLATION;  Surgeon: Deboraha Sprang, MD;  Location: [place] [facility];  Service: Cardiovascular;  Laterality: N/A;   BACK SURGERY     CORONARY ARTERY BYPASS GRAFT N/A 08/26/2018   Procedure:  CORONARY ARTERY BYPASS GRAFTING (CABG) x3 , Using left internal mammory artery and right leg greater saphronious vein. Harvested endoscopically.;  Surgeon: Ivin Poot, MD;  Location: [place];  Service: Open Heart Surgery;  Laterality: N/A;   EPICARDIAL PACING LEAD PLACEMENT N/A 08/26/2018   Procedure: LV PACING LEAD PLACEMENT;  Surgeon: Ivin Poot, MD;  Location: [place];  Service: Thoracic;  Laterality: N/A;   EPICARDIAL PACING LEAD PLACEMENT Left 11/07/2018   Procedure: REVISE PACEMAKER LEAD LEFT CHEST;  Surgeon: Ivin Poot, MD;  Location: [place];  Service: Thoracic;  Laterality: Left;   INSERT / REPLACE / [place] CATH AND CORONARY ANGIOGRAPHY N/A 06/13/2018   Procedure: LEFT HEART CATH AND CORONARY ANGIOGRAPHY;  Surgeon: Wellington Hampshire, MD;  Location: [place] [facility];   Service: Cardiovascular;  Laterality: N/A;   LEG SURGERY     PACEMAKER IMPLANT N/A 06/11/2017   Procedure: Pacemaker Implant;  Surgeon: Evans Lance, MD;  Location: [place] [facility];  Service: Cardiovascular;  Laterality: N/A;   PLEURAL EFFUSION DRAINAGE Left 09/24/2018   Procedure: DRAINAGE OF LOCULATED PLEURAL EFFUSION;  Surgeon: Ivin Poot, MD;  Location: [place];  Service: Thoracic;  Laterality: Left;   TEE WITHOUT CARDIOVERSION N/A 08/26/2018   Procedure: TRANSESOPHAGEAL ECHOCARDIOGRAM (TEE);  Surgeon: Prescott Gum, Collier Salina, MD;  Location: [place];  Service: Open Heart Surgery;  Laterality: N/A;   TEE WITHOUT CARDIOVERSION  03/03/2020   Procedure: Transesophageal Echocardiogram (Tee);  Surgeon: Deboraha Sprang, MD;  Location: [place] [facility];  Service: Cardiovascular;;   VIDEO ASSISTED THORACOSCOPY Left 09/24/2018   Procedure: VIDEO ASSISTED THORACOSCOPY;  Surgeon: Prescott Gum, Collier Salina, MD;  Location: [place];  Service: Thoracic;  Laterality: Left;    Current Outpatient Medications  Medication Sig Dispense Refill   acetaminophen (TYLENOL) 500 MG tablet Take 500 mg by mouth every 6 (six) hours as needed for moderate pain or headache.     allopurinol (ZYLOPRIM) 100 MG tablet Take 100 mg by mouth every other day. At night     aspirin EC 81 MG tablet Take 1 tablet (81 mg total) by mouth daily. Swallow whole. 90 tablet 3   atorvastatin (LIPITOR) 40 MG tablet TAKE ONE TABLET EVERY DAY 90 tablet 0   EPINEPHrine (PRIMATENE MIST) 0.125 MG/ACT AERO Inhale 1 puff into the lungs daily as needed (shortness of breath).     losartan (COZAAR) 25 MG tablet TAKE ONE TABLET (25 MG) BY MOUTH EVERY DAY 90 tablet 0   metoprolol succinate (TOPROL-XL) 25 MG 24 hr tablet Take 1 tablet (25 mg total) by mouth daily. 90 tablet 1   Multiple Vitamin (MULTIVITAMIN WITH MINERALS) TABS tablet Take 1 tablet by mouth daily.     Specialty Vitamins Products (PROSTATE PO) Take 1 tablet by mouth at bedtime.      spironolactone  (ALDACTONE) 25 MG tablet Take 12.5 mg by mouth daily.     tamsulosin (FLOMAX) 0.4 MG CAPS capsule Take 0.4 mg by mouth daily.     No current facility-administered medications for this visit.    Allergies  Allergen Reactions   Azithromycin Hives   Erythromycin Hives      Review of Systems negative except from HPI and PMH  Physical Exam BP (!) 100/58 (BP Location: Left Arm, Patient Position: Sitting, Cuff Size: Large)   Pulse 72   Ht 5' 10.5" (1.791 m)   Wt 237 lb (107.5 kg)  SpO2 95%   BMI 33.53 kg/m  Well developed and well nourished in no acute distress HENT normal Neck supple with JVP-flat Clear Device pocket well healed; without hematoma or erythema.  There is no tethering  Regular rate and rhythm, no  gallop No  murmur Abd-soft with active BS No Clubbing cyanosis tr edema Skin-warm and dry A & Oriented  Grossly normal sensory and motor function  ECG sinus and competing atrial pacing with P-synchronous/ AV  pacing  Intervals 16/16/44  Device function is normal. Programming changes  See Paceart for details     Assessment and  Plan   Complete heart block  Cardiomyopathy-ischemic with CABG  PVCs- now quiescient  Pacemaker-His-Medtronic    Aflutter persistent-recurrent previously paceterminated  S/p ablation  Hypertension \ HFrEF- chronic  Chronic and worsening renal insufficiency  Hypotension  No significant interval atrial fibrillation.  Will continue him on aspirin.  He has 70% atrial paced; not withstanding, his chronotropic competence seems adequate.  No symptoms so we will not reprogram.  Given the paucity of symptoms, we will continue his medications and will not undertake further imaging at this juncture.  Continue his losartan and metoprolol for his cardiomyopathy as well as his spironolactone..  Blood work is been followed by nephrology, and was notable 8/23 for normal potassium  PVCs by histogram and PVC counters are less than 0.1/h

## 2022-12-18 NOTE — Patient Instructions (Signed)
Medication Instructions:  - Your physician recommends that you continue on your current medications as directed. Please refer to the Current Medication list given to you today.  *If you need a refill on your cardiac medications before your next appointment, please call your pharmacy*   Lab Work: -  none ordered  If you have labs (blood work) drawn today and your tests are completely normal, you will receive your results only by: [place] (if you have MyChart) OR A paper copy in the mail If you have any lab test that is abnormal or we need to change your treatment, we will call you to review the results.   Testing/Procedures: - none ordered   Follow-Up: At [facility], you and your health needs are our priority.  As part of our continuing mission to provide you with exceptional heart care, we have created designated Provider Care Teams.  These Care Teams include your primary Cardiologist (physician) and Advanced Practice Providers (APPs -  Physician Assistants and Nurse Practitioners) who all work together to provide you with the care you need, when you need it.  We recommend signing up for the patient portal called "MyChart".  Sign up information is provided on this After Visit Summary.  MyChart is used to connect with patients for Virtual Visits (Telemedicine).  Patients are able to view lab/test results, encounter notes, upcoming appointments, etc.  Non-urgent messages can be sent to your provider as well.   To learn more about what you can do with MyChart, go to [email or web address].    Your next appointment:   1 year(s)  Provider:   Virl Axe, MD    Other Instructions None ordered

## 2022-12-20 DIAGNOSIS — I129 Hypertensive chronic kidney disease with stage 1 through stage 4 chronic kidney disease, or unspecified chronic kidney disease: Secondary | ICD-10-CM | POA: Diagnosis not present

## 2022-12-20 DIAGNOSIS — N1832 Chronic kidney disease, stage 3b: Secondary | ICD-10-CM | POA: Diagnosis not present

## 2022-12-20 DIAGNOSIS — N189 Chronic kidney disease, unspecified: Secondary | ICD-10-CM | POA: Diagnosis not present

## 2022-12-20 DIAGNOSIS — I10 Essential (primary) hypertension: Secondary | ICD-10-CM | POA: Diagnosis not present

## 2022-12-20 DIAGNOSIS — N281 Cyst of kidney, acquired: Secondary | ICD-10-CM | POA: Diagnosis not present

## 2022-12-21 ENCOUNTER — Telehealth: Payer: Self-pay | Admitting: Internal Medicine

## 2022-12-21 DIAGNOSIS — I255 Ischemic cardiomyopathy: Secondary | ICD-10-CM

## 2022-12-21 DIAGNOSIS — Z79899 Other long term (current) drug therapy: Secondary | ICD-10-CM

## 2022-12-21 DIAGNOSIS — I5022 Chronic systolic (congestive) heart failure: Secondary | ICD-10-CM

## 2022-12-21 MED ORDER — SPIRONOLACTONE 25 MG PO TABS: ORAL_TABLET | ORAL | 1 refills | Status: DC

## 2022-12-21 NOTE — Telephone Encounter (Signed)
Pt c/o medication issue:  1. Name of Medication: spironolactone (ALDACTONE) 25 MG tablet   2. How are you currently taking this medication (dosage and times per day)?  Not sure patient has been taking this   3. Are you having a reaction (difficulty breathing--STAT)?   4. What is your medication issue?  Pt's wife is requesting return call to get clarification as to how this medication is suppose to be taken.  She states that they just found this med bottle at the back of med cabinet and she does not believe pt has been taking it. Please advise.

## 2022-12-21 NOTE — Telephone Encounter (Addendum)
I spoke with the patient's wife, Izora Gala (ok per DPR).  She advised that the patient has spironolactone on his medication list, but she is not sure the patient has been taking this for several months as they found his prescription bottle at the back of the medicine cabinet recently.  I advised Izora Gala that per Dr. Olin Pia most recent office note from 12/18/22: Given the paucity of symptoms, we will continue his medications and will not undertake further imaging at this juncture.  Continue his losartan and metoprolol for his cardiomyopathy as well as his spironolactone..  Blood work is been followed by nephrology, and was notable 8/23 for normal potassium.  I advised Izora Gala that the patient should be taking: - spironolactone 25 mg: 0.5 tablet (12.5 mg) by mouth once a day. I have advised that he should restart this if he has been off of it with plans for a BMP in 3 weeks at the [facility].  Izora Gala voices understanding and is agreeable.  To Dr. Caryl Comes as an Juluis Rainier.

## 2023-01-01 ENCOUNTER — Other Ambulatory Visit: Payer: Self-pay | Admitting: Internal Medicine

## 2023-01-03 ENCOUNTER — Other Ambulatory Visit: Payer: Self-pay | Admitting: Internal Medicine

## 2023-01-12 ENCOUNTER — Other Ambulatory Visit
Admission: RE | Admit: 2023-01-12 | Discharge: 2023-01-12 | Disposition: A | Discharge status: Home / Self Care | Payer: HMO | Attending: Internal Medicine | Admitting: Internal Medicine

## 2023-01-12 DIAGNOSIS — Z79899 Other long term (current) drug therapy: Secondary | ICD-10-CM | POA: Insufficient documentation

## 2023-01-12 DIAGNOSIS — I5022 Chronic systolic (congestive) heart failure: Secondary | ICD-10-CM | POA: Insufficient documentation

## 2023-01-12 DIAGNOSIS — I255 Ischemic cardiomyopathy: Secondary | ICD-10-CM | POA: Diagnosis not present

## 2023-01-12 LAB — BASIC METABOLIC PANEL
Anion gap: 8 (ref 5–15)
BUN: 34 mg/dL — ABNORMAL HIGH (ref 8–23)
CO2: 29 mmol/L (ref 22–32)
Calcium: 8.7 mg/dL — ABNORMAL LOW (ref 8.9–10.3)
Chloride: 100 mmol/L (ref 98–111)
Creatinine, Ser: 1.74 mg/dL — ABNORMAL HIGH (ref 0.61–1.24)
GFR, Estimated: 39 mL/min — ABNORMAL LOW (ref >60)
Glucose, Bld: 170 mg/dL — ABNORMAL HIGH (ref 70–99)
Potassium: 4.4 mmol/L (ref 3.5–5.1)
Sodium: 137 mmol/L (ref 135–145)

## 2023-01-17 DIAGNOSIS — H26493 Other secondary cataract, bilateral: Secondary | ICD-10-CM | POA: Diagnosis not present

## 2023-01-17 DIAGNOSIS — H524 Presbyopia: Secondary | ICD-10-CM | POA: Diagnosis not present

## 2023-01-17 DIAGNOSIS — H43811 Vitreous degeneration, right eye: Secondary | ICD-10-CM | POA: Diagnosis not present

## 2023-01-17 DIAGNOSIS — H02831 Dermatochalasis of right upper eyelid: Secondary | ICD-10-CM | POA: Diagnosis not present

## 2023-01-17 DIAGNOSIS — Z961 Presence of intraocular lens: Secondary | ICD-10-CM | POA: Diagnosis not present

## 2023-01-24 ENCOUNTER — Encounter: Payer: Self-pay | Admitting: Internal Medicine

## 2023-02-05 ENCOUNTER — Ambulatory Visit (INDEPENDENT_AMBULATORY_CARE_PROVIDER_SITE_OTHER): Payer: HMO

## 2023-02-05 DIAGNOSIS — I442 Atrioventricular block, complete: Secondary | ICD-10-CM | POA: Diagnosis not present

## 2023-02-06 ENCOUNTER — Telehealth: Payer: Self-pay

## 2023-02-06 NOTE — Telephone Encounter (Signed)
Pt left a voicemail wanting to know if we got his reading. I LMOVM letting patient know we did get his transmission.

## 2023-02-07 LAB — CUP PACEART REMOTE DEVICE CHECK
Battery Remaining Longevity: 22 mo
Battery Voltage: 2.9 V
Brady Statistic AP VP Percent: 70.52 %
Brady Statistic AP VS Percent: 0 %
Brady Statistic AS VP Percent: 29.47 %
Brady Statistic AS VS Percent: 0.01 %
Brady Statistic RA Percent Paced: 70.37 %
Brady Statistic RV Percent Paced: 99.99 %
Date Time Interrogation Session: 20240318184729
Implantable Lead Connection Status: 753985
Implantable Lead Connection Status: 753985
Implantable Lead Implant Date: 20180723
Implantable Lead Implant Date: 20180723
Implantable Lead Location: 753859
Implantable Lead Location: 753860
Implantable Lead Model: 3830
Implantable Lead Model: 5076
Implantable Pulse Generator Implant Date: 20180723
Lead Channel Impedance Value: 342 Ohm
Lead Channel Impedance Value: 342 Ohm
Lead Channel Impedance Value: 380 Ohm
Lead Channel Impedance Value: 456 Ohm
Lead Channel Pacing Threshold Amplitude: 0.75 V
Lead Channel Pacing Threshold Amplitude: 1.25 V
Lead Channel Pacing Threshold Pulse Width: 0.4 ms
Lead Channel Pacing Threshold Pulse Width: 0.4 ms
Lead Channel Sensing Intrinsic Amplitude: 0.875 mV
Lead Channel Sensing Intrinsic Amplitude: 0.875 mV
Lead Channel Sensing Intrinsic Amplitude: 5 mV
Lead Channel Sensing Intrinsic Amplitude: 5 mV
Lead Channel Setting Pacing Amplitude: 2 V
Lead Channel Setting Pacing Amplitude: 2.5 V
Lead Channel Setting Pacing Pulse Width: 0.8 ms
Lead Channel Setting Sensing Sensitivity: 2.8 mV
Zone Setting Status: 755011
Zone Setting Status: 755011

## 2023-03-09 ENCOUNTER — Other Ambulatory Visit: Payer: Self-pay | Admitting: Internal Medicine

## 2023-03-20 NOTE — Progress Notes (Signed)
Remote pacemaker transmission.   

## 2023-03-26 ENCOUNTER — Other Ambulatory Visit: Payer: Self-pay | Admitting: Internal Medicine

## 2023-04-09 ENCOUNTER — Other Ambulatory Visit: Payer: Self-pay | Admitting: Cardiovascular Disease

## 2023-04-11 ENCOUNTER — Ambulatory Visit: Payer: PPO | Admitting: Cardiovascular Disease

## 2023-04-17 ENCOUNTER — Ambulatory Visit: Payer: HMO | Admitting: Physician Assistant

## 2023-05-04 ENCOUNTER — Ambulatory Visit: Payer: HMO | Attending: Cardiovascular Disease | Admitting: Cardiology

## 2023-05-04 ENCOUNTER — Encounter: Payer: Self-pay | Admitting: Cardiology

## 2023-05-04 VITALS — BP 132/76 | HR 70 | Ht 70.0 in | Wt 238.0 lb

## 2023-05-04 DIAGNOSIS — Z951 Presence of aortocoronary bypass graft: Secondary | ICD-10-CM

## 2023-05-04 DIAGNOSIS — E785 Hyperlipidemia, unspecified: Secondary | ICD-10-CM

## 2023-05-04 DIAGNOSIS — I255 Ischemic cardiomyopathy: Secondary | ICD-10-CM | POA: Diagnosis not present

## 2023-05-04 DIAGNOSIS — J302 Other seasonal allergic rhinitis: Secondary | ICD-10-CM

## 2023-05-04 DIAGNOSIS — Z950 Presence of cardiac pacemaker: Secondary | ICD-10-CM

## 2023-05-04 DIAGNOSIS — I2510 Atherosclerotic heart disease of native coronary artery without angina pectoris: Secondary | ICD-10-CM | POA: Diagnosis not present

## 2023-05-04 DIAGNOSIS — I5022 Chronic systolic (congestive) heart failure: Secondary | ICD-10-CM

## 2023-05-04 DIAGNOSIS — I442 Atrioventricular block, complete: Secondary | ICD-10-CM | POA: Diagnosis not present

## 2023-05-04 MED ORDER — LORATADINE 10 MG PO TABS
10.0000 mg | ORAL_TABLET | Freq: Every day | ORAL | 3 refills | Status: AC
Start: 2023-05-04 — End: ?

## 2023-05-04 NOTE — Progress Notes (Signed)
Cardiology Office Note:  .   Date:  05/04/2023  ID:  Sean [name]., DOB [birth year], MRN [id number] PCP: Jerl Mina, MD  [facility] Providers Cardiologist:  None    History of Present Illness: .   Sean [name]. is a 82 y.o. 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  He had a previous echocardiogram done in May 2019 which revealed LVEF of 35-40% with diffuse hypokinesis, mild mitral regurgitation and mildly dilated left atrium.  Due to his cardiomyopathy he underwent a coronary CTA which showed evidence of three-vessel coronary artery disease with a left dominant system.  FFR was less than 0.6 in all vessels.  He underwent left heart catheterization in July 2019 which showed left dominant coronary arteries with severe heavily calcified three-vessel coronary artery disease including subtotal occlusion of the mid LAD and ulcerated plaque in the proximal left circumflex.  Post CABG he developed a left upper extremity DVT and was started on apixaban.  At [facility] underwent CABG x 3 (LIMA-LAD, SVG-diagonal, SVG-left circumflex) in 08/2018.  09/2018 he had near syncopal episode and imaging demonstrated a large hemothorax attributed per the family to his left leg following his bypass surgery, he was transferred to [facility] and underwent bedside for drainage and decortication.  11/2018 he was found to have persistent atrial flutter for greater than 72 hours and was referred to A-fib clinic.  ECG revealed a flutter and  he underwent atrial flutter ablation in April 2021.  Given low EF the plan was to switch his pacemaker to CRT at some point but given he had NYHA class I symptoms that was not done.   He was last seen in clinic 12/09/2022 by Dr.  Graciela Husbands.  At that time he was doing fairly well with no significant atrial fibrillation he was continued on his aspirin.  He was 70% atrial paced.  He had no symptoms so he was not reprogrammed.  He was continued on his medication.  There were no other changes that were made and no further testing that was ordered.  He returns clinic today accompanied by his wife.  He states overall that he has been doing fairly well.  Wife has complaints of increased sleepiness.  Patient states that he has swelling and bruising to the left hand that he has had since his previous heart catheterization.  He also complains of a postnasal drip and has been using his inhaler every day prior to leaving his bedroom.  Has not tried any other antihistamines.  Has been compliant with his other medications.  Denies any hospitalizations or visits to the emergency department.  ROS: Review of systems has been completed and considered negative with exception of what is listed in the HPI  Studies Reviewed: Marland Kitchen    EKG: A sensed V paced rhythm at a rate of 70, no acute change from prior studies  TEE 03/03/20  1. Septal hypokinesis with overall moderate to severe LV dysfunction;  trace AI; mild MR; biatrial enlargement; no left atrial appendage  thrombus; mild TR.   2. Left ventricular ejection fraction, by estimation, is 30 to 35%. The  left ventricle has moderate to severely decreased function. The left  ventricle demonstrates regional wall motion abnormalities (see scoring  diagram/findings for description). The  left ventricular internal cavity  size was mildly dilated.   3. Right ventricular systolic function is normal. The right ventricular  size is normal.   4. Left atrial size was moderately dilated. No left atrial/left atrial  appendage thrombus was detected.   5. Right atrial size was mildly dilated.   6. The mitral valve is normal in structure. Mild mitral valve  regurgitation.   7. The aortic valve is tricuspid. Aortic  valve regurgitation is trivial.  Mild aortic valve sclerosis is present, with no evidence of aortic valve  stenosis.   8. There is mild (Grade II) plaque involving the descending aorta.   Risk Assessment/Calculations:             Physical Exam:   VS:  BP 132/76 (BP Location: Left Arm, Patient Position: Sitting, Cuff Size: Large)   Pulse 70   Ht 5\' 10"  (1.778 m)   Wt 238 lb (108 kg)   SpO2 97%   BMI 34.15 kg/m    Wt Readings from Last 3 Encounters:  05/04/23 238 lb (108 kg)  12/18/22 237 lb (107.5 kg)  10/10/22 238 lb 2 oz (108 kg)    GEN: Well nourished, well developed in no acute distress NECK: No JVD; No carotid bruits CARDIAC: RRR, no murmurs, rubs, gallops RESPIRATORY:  Clear to auscultation without rales, wheezing or rhonchi  ABDOMEN: Soft, non-tender, non-distended EXTREMITIES: Trace pretibial edema; No deformity   ASSESSMENT AND PLAN: .   Coronary artery disease native coronary artery without angina.  Patient is doing well post CABG without anginal symptoms.  No ischemic changes noted on EKG today as he is a sensed and V paced at a rate of 70.  He is continued on aspirin 81 mg daily, atorvastatin 40 mg daily.  Chronic systolic heart failure likely due to ischemic cardiomyopathy with an EF of 35-40%.  He is currently NYHA class I without evidence of volume overload.  He is continued on Toprol-XL 25 mg daily losartan 25 mg daily and spironolactone 12.5 mg daily.  Escalation of GDMT has been limited due to hypotension and underlying chronic kidney disease.   Essential hypertension blood pressure today 132/76 patient typically runs around 100 systolic.  He states he did eat barbecue breakfast earlier today which is likely the culprit of increased pressure.  He has been continued on his current medication regimen without changes being made.  He has been advised to continue to monitor his pressures at home as well.  Mixed hyperlipidemia with his last LDL in 2022 147.  Continued  on atorvastatin 40 mg daily.  Will need a follow-up lipid panel on return.  History of atrial flutter status post ablation without any reoccurrence noted.  Last device upload in 01/2023 revealed no atrial flutter.  Seasonal allergies with complaints of postnasal drip today.  Recommend that  he start on loratadine 10 mg daily. Stressed to not use antihistamine with combined decongestants.  Cardiac device in situ.  Continues to follow with EP.  Next remote upload is within the next upcoming weeks.       Dispo: Patient to return to clinic and follow-up with primary cardiologist Dr.Arida in 6 months or sooner if needed.  Signed, Vasiliy Mccarry, NP

## 2023-05-04 NOTE — Patient Instructions (Signed)
Medication Instructions:  Your physician has recommended you make the following change in your medication:  loratadine (CLARITIN) 10 MG tablet - Take 1 tablet (10 mg total) by mouth daily  *If you need a refill on your cardiac medications before your next appointment, please call your pharmacy*  Lab Work: -None ordered  Testing/Procedures: -None ordered  Follow-Up: At [facility], you and your health needs are our priority.  As part of our continuing mission to provide you with exceptional heart care, we have created designated Provider Care Teams.  These Care Teams include your primary Cardiologist (physician) and Advanced Practice Providers (APPs -  Physician Assistants and Nurse Practitioners) who all work together to provide you with the care you need, when you need it.  Your next appointment:   6 month(s)  Provider:   Lorine Bears, MD    Other Instructions -None

## 2023-05-07 ENCOUNTER — Ambulatory Visit (INDEPENDENT_AMBULATORY_CARE_PROVIDER_SITE_OTHER): Payer: HMO

## 2023-05-07 DIAGNOSIS — I442 Atrioventricular block, complete: Secondary | ICD-10-CM | POA: Diagnosis not present

## 2023-05-08 LAB — CUP PACEART REMOTE DEVICE CHECK
Battery Remaining Longevity: 20 mo
Battery Voltage: 2.89 V
Brady Statistic AP VP Percent: 71.05 %
Brady Statistic AP VS Percent: 0 %
Brady Statistic AS VP Percent: 28.94 %
Brady Statistic AS VS Percent: 0.01 %
Brady Statistic RA Percent Paced: 70.99 %
Brady Statistic RV Percent Paced: 99.99 %
Date Time Interrogation Session: 20240618124814
Implantable Lead Connection Status: 753985
Implantable Lead Connection Status: 753985
Implantable Lead Implant Date: 20180723
Implantable Lead Implant Date: 20180723
Implantable Lead Location: 753859
Implantable Lead Location: 753860
Implantable Lead Model: 3830
Implantable Lead Model: 5076
Implantable Pulse Generator Implant Date: 20180723
Lead Channel Impedance Value: 323 Ohm
Lead Channel Impedance Value: 342 Ohm
Lead Channel Impedance Value: 361 Ohm
Lead Channel Impedance Value: 475 Ohm
Lead Channel Pacing Threshold Amplitude: 0.875 V
Lead Channel Pacing Threshold Amplitude: 1.25 V
Lead Channel Pacing Threshold Pulse Width: 0.4 ms
Lead Channel Pacing Threshold Pulse Width: 0.4 ms
Lead Channel Sensing Intrinsic Amplitude: 1 mV
Lead Channel Sensing Intrinsic Amplitude: 1 mV
Lead Channel Sensing Intrinsic Amplitude: 5 mV
Lead Channel Sensing Intrinsic Amplitude: 5 mV
Lead Channel Setting Pacing Amplitude: 2 V
Lead Channel Setting Pacing Amplitude: 2.5 V
Lead Channel Setting Pacing Pulse Width: 0.8 ms
Lead Channel Setting Sensing Sensitivity: 2.8 mV
Zone Setting Status: 755011
Zone Setting Status: 755011

## 2023-05-28 NOTE — Progress Notes (Signed)
Remote pacemaker transmission.   

## 2023-06-12 ENCOUNTER — Emergency Department: Payer: HMO

## 2023-06-12 ENCOUNTER — Ambulatory Visit
Admission: RE | Admit: 2023-06-12 | Discharge: 2023-06-12 | Disposition: A | Discharge status: Home / Self Care | Payer: HMO | Source: Ambulatory Visit | Attending: Nephrology | Admitting: Nephrology

## 2023-06-12 ENCOUNTER — Other Ambulatory Visit: Payer: Self-pay

## 2023-06-12 ENCOUNTER — Other Ambulatory Visit: Payer: Self-pay | Admitting: Nephrology

## 2023-06-12 ENCOUNTER — Inpatient Hospital Stay
Admission: EM | Admit: 2023-06-12 | Discharge: 2023-06-16 | DRG: 270 | Disposition: A | Discharge status: Home / Self Care | Payer: HMO | Attending: Internal Medicine | Admitting: Internal Medicine

## 2023-06-12 DIAGNOSIS — E669 Obesity, unspecified: Secondary | ICD-10-CM | POA: Diagnosis not present

## 2023-06-12 DIAGNOSIS — I130 Hypertensive heart and chronic kidney disease with heart failure and stage 1 through stage 4 chronic kidney disease, or unspecified chronic kidney disease: Secondary | ICD-10-CM | POA: Diagnosis not present

## 2023-06-12 DIAGNOSIS — D696 Thrombocytopenia, unspecified: Secondary | ICD-10-CM | POA: Diagnosis not present

## 2023-06-12 DIAGNOSIS — M79661 Pain in right lower leg: Secondary | ICD-10-CM | POA: Insufficient documentation

## 2023-06-12 DIAGNOSIS — I129 Hypertensive chronic kidney disease with stage 1 through stage 4 chronic kidney disease, or unspecified chronic kidney disease: Secondary | ICD-10-CM | POA: Diagnosis not present

## 2023-06-12 DIAGNOSIS — Z811 Family history of alcohol abuse and dependence: Secondary | ICD-10-CM

## 2023-06-12 DIAGNOSIS — N401 Enlarged prostate with lower urinary tract symptoms: Secondary | ICD-10-CM | POA: Diagnosis not present

## 2023-06-12 DIAGNOSIS — M109 Gout, unspecified: Secondary | ICD-10-CM | POA: Diagnosis present

## 2023-06-12 DIAGNOSIS — I82811 Embolism and thrombosis of superficial veins of right lower extremities: Secondary | ICD-10-CM | POA: Diagnosis not present

## 2023-06-12 DIAGNOSIS — N179 Acute kidney failure, unspecified: Secondary | ICD-10-CM | POA: Diagnosis not present

## 2023-06-12 DIAGNOSIS — E1122 Type 2 diabetes mellitus with diabetic chronic kidney disease: Secondary | ICD-10-CM | POA: Diagnosis present

## 2023-06-12 DIAGNOSIS — Z951 Presence of aortocoronary bypass graft: Secondary | ICD-10-CM

## 2023-06-12 DIAGNOSIS — Z7982 Long term (current) use of aspirin: Secondary | ICD-10-CM

## 2023-06-12 DIAGNOSIS — I2510 Atherosclerotic heart disease of native coronary artery without angina pectoris: Secondary | ICD-10-CM | POA: Diagnosis not present

## 2023-06-12 DIAGNOSIS — Z881 Allergy status to other antibiotic agents status: Secondary | ICD-10-CM

## 2023-06-12 DIAGNOSIS — R8290 Unspecified abnormal findings in urine: Secondary | ICD-10-CM | POA: Diagnosis not present

## 2023-06-12 DIAGNOSIS — N19 Unspecified kidney failure: Secondary | ICD-10-CM | POA: Diagnosis not present

## 2023-06-12 DIAGNOSIS — D631 Anemia in chronic kidney disease: Secondary | ICD-10-CM | POA: Diagnosis not present

## 2023-06-12 DIAGNOSIS — Z1152 Encounter for screening for COVID-19: Secondary | ICD-10-CM

## 2023-06-12 DIAGNOSIS — Z833 Family history of diabetes mellitus: Secondary | ICD-10-CM

## 2023-06-12 DIAGNOSIS — N184 Chronic kidney disease, stage 4 (severe): Secondary | ICD-10-CM

## 2023-06-12 DIAGNOSIS — N400 Enlarged prostate without lower urinary tract symptoms: Secondary | ICD-10-CM

## 2023-06-12 DIAGNOSIS — M7989 Other specified soft tissue disorders: Secondary | ICD-10-CM | POA: Insufficient documentation

## 2023-06-12 DIAGNOSIS — I82441 Acute embolism and thrombosis of right tibial vein: Secondary | ICD-10-CM | POA: Diagnosis not present

## 2023-06-12 DIAGNOSIS — I447 Left bundle-branch block, unspecified: Secondary | ICD-10-CM | POA: Diagnosis present

## 2023-06-12 DIAGNOSIS — E1151 Type 2 diabetes mellitus with diabetic peripheral angiopathy without gangrene: Secondary | ICD-10-CM | POA: Diagnosis not present

## 2023-06-12 DIAGNOSIS — I824Y1 Acute embolism and thrombosis of unspecified deep veins of right proximal lower extremity: Principal | ICD-10-CM | POA: Insufficient documentation

## 2023-06-12 DIAGNOSIS — I82431 Acute embolism and thrombosis of right popliteal vein: Secondary | ICD-10-CM | POA: Diagnosis not present

## 2023-06-12 DIAGNOSIS — Z823 Family history of stroke: Secondary | ICD-10-CM

## 2023-06-12 DIAGNOSIS — Z7901 Long term (current) use of anticoagulants: Secondary | ICD-10-CM

## 2023-06-12 DIAGNOSIS — E875 Hyperkalemia: Secondary | ICD-10-CM | POA: Diagnosis not present

## 2023-06-12 DIAGNOSIS — E871 Hypo-osmolality and hyponatremia: Secondary | ICD-10-CM | POA: Diagnosis present

## 2023-06-12 DIAGNOSIS — J45909 Unspecified asthma, uncomplicated: Secondary | ICD-10-CM | POA: Diagnosis not present

## 2023-06-12 DIAGNOSIS — I10 Essential (primary) hypertension: Secondary | ICD-10-CM | POA: Diagnosis not present

## 2023-06-12 DIAGNOSIS — N189 Chronic kidney disease, unspecified: Secondary | ICD-10-CM | POA: Diagnosis not present

## 2023-06-12 DIAGNOSIS — I2699 Other pulmonary embolism without acute cor pulmonale: Secondary | ICD-10-CM | POA: Diagnosis not present

## 2023-06-12 DIAGNOSIS — R2241 Localized swelling, mass and lump, right lower limb: Secondary | ICD-10-CM | POA: Diagnosis not present

## 2023-06-12 DIAGNOSIS — N2581 Secondary hyperparathyroidism of renal origin: Secondary | ICD-10-CM | POA: Diagnosis not present

## 2023-06-12 DIAGNOSIS — I459 Conduction disorder, unspecified: Secondary | ICD-10-CM | POA: Diagnosis present

## 2023-06-12 DIAGNOSIS — Z6834 Body mass index (BMI) 34.0-34.9, adult: Secondary | ICD-10-CM

## 2023-06-12 DIAGNOSIS — M1000 Idiopathic gout, unspecified site: Secondary | ICD-10-CM | POA: Diagnosis not present

## 2023-06-12 DIAGNOSIS — Z950 Presence of cardiac pacemaker: Secondary | ICD-10-CM | POA: Diagnosis not present

## 2023-06-12 DIAGNOSIS — D72829 Elevated white blood cell count, unspecified: Secondary | ICD-10-CM | POA: Diagnosis not present

## 2023-06-12 DIAGNOSIS — Z66 Do not resuscitate: Secondary | ICD-10-CM | POA: Diagnosis not present

## 2023-06-12 DIAGNOSIS — Z79899 Other long term (current) drug therapy: Secondary | ICD-10-CM

## 2023-06-12 DIAGNOSIS — K219 Gastro-esophageal reflux disease without esophagitis: Secondary | ICD-10-CM | POA: Diagnosis present

## 2023-06-12 DIAGNOSIS — I82411 Acute embolism and thrombosis of right femoral vein: Secondary | ICD-10-CM | POA: Diagnosis not present

## 2023-06-12 DIAGNOSIS — I5042 Chronic combined systolic (congestive) and diastolic (congestive) heart failure: Secondary | ICD-10-CM | POA: Diagnosis not present

## 2023-06-12 DIAGNOSIS — N1832 Chronic kidney disease, stage 3b: Secondary | ICD-10-CM | POA: Diagnosis not present

## 2023-06-12 DIAGNOSIS — R338 Other retention of urine: Secondary | ICD-10-CM | POA: Diagnosis not present

## 2023-06-12 DIAGNOSIS — M1990 Unspecified osteoarthritis, unspecified site: Secondary | ICD-10-CM | POA: Diagnosis present

## 2023-06-12 DIAGNOSIS — D649 Anemia, unspecified: Secondary | ICD-10-CM | POA: Diagnosis not present

## 2023-06-12 DIAGNOSIS — I2609 Other pulmonary embolism with acute cor pulmonale: Secondary | ICD-10-CM | POA: Diagnosis not present

## 2023-06-12 DIAGNOSIS — E785 Hyperlipidemia, unspecified: Secondary | ICD-10-CM | POA: Diagnosis not present

## 2023-06-12 DIAGNOSIS — I97190 Other postprocedural cardiac functional disturbances following cardiac surgery: Secondary | ICD-10-CM | POA: Diagnosis not present

## 2023-06-12 LAB — CBC WITH DIFFERENTIAL/PLATELET
Abs Immature Granulocytes: 0.16 10*3/uL — ABNORMAL HIGH (ref 0.00–0.07)
Basophils Absolute: 0.1 10*3/uL (ref 0.0–0.1)
Basophils Relative: 0 %
Eosinophils Absolute: 0.2 10*3/uL (ref 0.0–0.5)
Eosinophils Relative: 2 %
HCT: 36.7 % — ABNORMAL LOW (ref 39.0–52.0)
Hemoglobin: 11.9 g/dL — ABNORMAL LOW (ref 13.0–17.0)
Immature Granulocytes: 1 %
Lymphocytes Relative: 10 %
Lymphs Abs: 1.4 10*3/uL (ref 0.7–4.0)
MCH: 31 pg (ref 26.0–34.0)
MCHC: 32.4 g/dL (ref 30.0–36.0)
MCV: 95.6 fL (ref 80.0–100.0)
Monocytes Absolute: 1.3 10*3/uL — ABNORMAL HIGH (ref 0.1–1.0)
Monocytes Relative: 9 %
Neutro Abs: 11.2 10*3/uL — ABNORMAL HIGH (ref 1.7–7.7)
Neutrophils Relative %: 78 %
Platelets: 148 10*3/uL — ABNORMAL LOW (ref 150–400)
RBC: 3.84 MIL/uL — ABNORMAL LOW (ref 4.22–5.81)
RDW: 12.3 % (ref 11.5–15.5)
WBC: 14.3 10*3/uL — ABNORMAL HIGH (ref 4.0–10.5)
nRBC: 0 % (ref 0.0–0.2)

## 2023-06-12 LAB — COMPREHENSIVE METABOLIC PANEL
ALT: 9 U/L (ref 0–44)
AST: 13 U/L — ABNORMAL LOW (ref 15–41)
Albumin: 4.5 g/dL (ref 3.5–5.0)
Alkaline Phosphatase: 44 U/L (ref 38–126)
Anion gap: 10 (ref 5–15)
BUN: 82 mg/dL — ABNORMAL HIGH (ref 8–23)
CO2: 22 mmol/L (ref 22–32)
Calcium: 9.6 mg/dL (ref 8.9–10.3)
Chloride: 102 mmol/L (ref 98–111)
Creatinine, Ser: 4.75 mg/dL — ABNORMAL HIGH (ref 0.61–1.24)
GFR, Estimated: 12 mL/min — ABNORMAL LOW (ref >60)
Glucose, Bld: 133 mg/dL — ABNORMAL HIGH (ref 70–99)
Potassium: 5.1 mmol/L (ref 3.5–5.1)
Sodium: 134 mmol/L — ABNORMAL LOW (ref 135–145)
Total Bilirubin: 1.4 mg/dL — ABNORMAL HIGH (ref 0.3–1.2)
Total Protein: 7.6 g/dL (ref 6.5–8.1)

## 2023-06-12 LAB — PROTIME-INR
INR: 1.2 (ref 0.8–1.2)
Prothrombin Time: 15.8 seconds — ABNORMAL HIGH (ref 11.4–15.2)

## 2023-06-12 LAB — TROPONIN I (HIGH SENSITIVITY)
Troponin I (High Sensitivity): 3 ng/L (ref <18)
Troponin I (High Sensitivity): 3 ng/L (ref <18)

## 2023-06-12 LAB — HEPARIN LEVEL (UNFRACTIONATED): Heparin Unfractionated: 0.88 IU/mL — ABNORMAL HIGH (ref 0.30–0.70)

## 2023-06-12 LAB — APTT: aPTT: 35 seconds (ref 24–36)

## 2023-06-12 MED ORDER — HEPARIN (PORCINE) 25000 UT/250ML-% IV SOLN
1550.0000 [IU]/h | INTRAVENOUS | Status: AC
  Administered 2023-06-12: 1550 [IU]/h via INTRAVENOUS
  Administered 2023-06-13: 1350 [IU]/h via INTRAVENOUS
  Administered 2023-06-14: 1550 [IU]/h via INTRAVENOUS
  Filled 2023-06-12 (×4): qty 250

## 2023-06-12 MED ORDER — SODIUM CHLORIDE 0.9 % IV SOLN: INTRAVENOUS | Status: DC

## 2023-06-12 MED ORDER — TECHNETIUM TO 99M ALBUMIN AGGREGATED
4.0000 | Freq: Once | INTRAVENOUS | Status: AC | PRN
  Administered 2023-06-12: 4.31 via INTRAVENOUS

## 2023-06-12 MED ORDER — HEPARIN BOLUS VIA INFUSION
5800.0000 [IU] | Freq: Once | INTRAVENOUS | Status: AC
  Administered 2023-06-12: 5800 [IU] via INTRAVENOUS
  Filled 2023-06-12: qty 5800

## 2023-06-12 MED ORDER — TRAZODONE HCL 50 MG PO TABS: 25.0000 mg | ORAL_TABLET | Freq: Every evening | ORAL | Status: DC | PRN

## 2023-06-12 MED ORDER — ALLOPURINOL 100 MG PO TABS
100.0000 mg | ORAL_TABLET | ORAL | Status: DC
  Administered 2023-06-13 – 2023-06-15 (×2): 100 mg via ORAL
  Filled 2023-06-12 (×2): qty 1

## 2023-06-12 MED ORDER — LOSARTAN POTASSIUM 50 MG PO TABS: 25.0000 mg | ORAL_TABLET | Freq: Every day | ORAL | Status: DC

## 2023-06-12 MED ORDER — ACETAMINOPHEN 325 MG PO TABS: 650.0000 mg | ORAL_TABLET | Freq: Four times a day (QID) | ORAL | Status: DC | PRN

## 2023-06-12 MED ORDER — ONDANSETRON HCL 4 MG/2ML IJ SOLN: 4.0000 mg | Freq: Four times a day (QID) | INTRAMUSCULAR | Status: DC | PRN

## 2023-06-12 MED ORDER — ATORVASTATIN CALCIUM 20 MG PO TABS
40.0000 mg | ORAL_TABLET | Freq: Every day | ORAL | Status: DC
  Administered 2023-06-12 – 2023-06-16 (×5): 40 mg via ORAL
  Filled 2023-06-12 (×5): qty 2

## 2023-06-12 MED ORDER — TAMSULOSIN HCL 0.4 MG PO CAPS
0.4000 mg | ORAL_CAPSULE | Freq: Every day | ORAL | Status: DC
  Administered 2023-06-13 – 2023-06-16 (×4): 0.4 mg via ORAL
  Filled 2023-06-12 (×4): qty 1

## 2023-06-12 MED ORDER — MAGNESIUM HYDROXIDE 400 MG/5ML PO SUSP: 30.0000 mL | Freq: Every day | ORAL | Status: DC | PRN

## 2023-06-12 MED ORDER — ACETAMINOPHEN 650 MG RE SUPP: 650.0000 mg | Freq: Four times a day (QID) | RECTAL | Status: DC | PRN

## 2023-06-12 MED ORDER — ONDANSETRON HCL 4 MG PO TABS: 4.0000 mg | ORAL_TABLET | Freq: Four times a day (QID) | ORAL | Status: DC | PRN

## 2023-06-12 MED ORDER — SPIRONOLACTONE 25 MG PO TABS: 25.0000 mg | ORAL_TABLET | Freq: Every day | ORAL | Status: DC

## 2023-06-12 MED ORDER — LORATADINE 10 MG PO TABS
10.0000 mg | ORAL_TABLET | Freq: Every day | ORAL | Status: DC
  Administered 2023-06-13 – 2023-06-16 (×4): 10 mg via ORAL
  Filled 2023-06-12 (×4): qty 1

## 2023-06-12 MED ORDER — ASPIRIN 81 MG PO TBEC
81.0000 mg | DELAYED_RELEASE_TABLET | Freq: Every day | ORAL | Status: DC
  Administered 2023-06-12 – 2023-06-16 (×5): 81 mg via ORAL
  Filled 2023-06-12 (×5): qty 1

## 2023-06-12 MED ORDER — METOPROLOL SUCCINATE ER 25 MG PO TB24
25.0000 mg | ORAL_TABLET | Freq: Every day | ORAL | Status: DC
  Administered 2023-06-13 – 2023-06-16 (×4): 25 mg via ORAL
  Filled 2023-06-12 (×4): qty 1

## 2023-06-12 MED ORDER — ADULT MULTIVITAMIN W/MINERALS CH
1.0000 | ORAL_TABLET | Freq: Every day | ORAL | Status: DC
  Administered 2023-06-12 – 2023-06-16 (×5): 1 via ORAL
  Filled 2023-06-12 (×5): qty 1

## 2023-06-12 NOTE — Assessment & Plan Note (Signed)
-   We will continue statin therapy. 

## 2023-06-12 NOTE — ED Provider Notes (Signed)
Care of this patient assumed from prior physician at 1500 pending VQ scan and anticipated admission. Please see prior physician note for further details.  Briefly, this is an 82 year old male who presented after an outpatient ultrasound demonstrated a right-sided DVT. His workup demonstrated leukocytosis with WBC 14.3, mild anemia with hemoglobin of 11.9.  Of note, he did have a significant AKI with a creatinine of 4.75, 1.74 5 months ago.  Negative troponin.  A CTA of the chest was initially ordered, but upon identification of patient significant renal injury this was changed to a VQ scan.  Patient was ordered for heparin.  It was signed out to me pending VQ scan.  If this is suggestive of significant clot burden that may be a candidate for thrombectomy, will plan for discussion with vascular surgery.  Ultimate plan for admission.  I was contacted by radiology. Patient's VQ scan did demonstrate several large defects in the left upper lobe and bilateral lobes peripherally concerning for a high probability perfusion scan.  No obvious right heart strain noted.  Patient evaluated at bedside.  He denies chest pain or shortness of breath.  Satting in the upper 90s on room air without appreciable respiratory distress.  Will review with vascular surgery team.  Will also obtain a postvoid residual in the setting of his acute on chronic renal failure.  Will reach out to hospitalist team to discuss admission.  Case reviewed with Dr. Wyn Quaker.  In the absence of significant cardiorespiratory symptoms, he did not feel the risk of CT angiogram outweigh the benefits and did recommend treatment with heparin for management of patient's VTE.  Case discussed with Dr. Arville Care.  He will evaluate patient for anticipated admission.   Trinna Post, MD 06/13/23 [phone]

## 2023-06-12 NOTE — ED Triage Notes (Signed)
Pt here from an outpatient scan, pt noted to have a large occlusive clot in his right leg. Pt having thigh pain. Pt denies NVD.

## 2023-06-12 NOTE — ED Notes (Signed)
Pt eating with family at this time.

## 2023-06-12 NOTE — Progress Notes (Signed)
Chap visited Pt and family while on a routine rounding. Pt reviewed his life as this Sean [name] supplied active listening. Chap services remains available as need arise. Kindly page the [place].   06/12/23 1500  Spiritual Encounters  Type of Visit Initial  Care provided to: Pt and family  Referral source Chaplain assessment  Reason for visit Routine spiritual support  OnCall Visit No  Spiritual Framework  Presenting Themes Courage hope and growth;Impactful experiences and emotions  Community/Connection Family  Interventions  Spiritual Care Interventions Made Established relationship of care and support;Reflective listening  Intervention Outcomes  Outcomes Awareness of support;Connection to spiritual care  Spiritual Care Plan  Spiritual Care Issues Still Outstanding No further spiritual care needs at this time (see row info)

## 2023-06-12 NOTE — Consult Note (Signed)
ANTICOAGULATION CONSULT NOTE - Initial Consult  Pharmacy Consult for Heparin Infusion Indication: DVT  Allergies  Allergen Reactions   Azithromycin Hives   Erythromycin Hives    Patient Measurements: Height: 5\' 10"  (177.8 cm) Weight: 108 kg (238 lb 1.6 oz) IBW/kg (Calculated) : 73 Heparin Dosing Weight: 96.3 kg  Vital Signs: Temp: 98 F (36.7 C) (07/23 1337) Temp Source: Oral (07/23 1337) BP: 146/81 (07/23 1337) Pulse Rate: 78 (07/23 1337)  Labs: No results for input(s): "HGB", "HCT", "PLT", "APTT", "LABPROT", "INR", "HEPARINUNFRC", "HEPRLOWMOCWT", "CREATININE", "CKTOTAL", "CKMB", "TROPONINIHS" in the last 72 hours.  CrCl cannot be calculated (Patient's most recent lab result is older than the maximum 21 days allowed.).   Medical History: Past Medical History:  Diagnosis Date   Arthritis    Asthma    CAD (coronary artery disease)    a. LHC 7/19: ostLM 30%, ostLAD 30%, p-mLAD 99% mod calcified, ostD1 90%, ost-pLCx 80% ulcerative & mod calcified, p-mLCx 50%, OM2 50%, mod dz LPDA, RCA small w/ diffuse dz throughout; b. recommendation of CABG; c. 3-V CABG 08/26/18 (LIMA-LAD, VG-Diag, VG-LCx)   Chronic systolic CHF (congestive heart failure) (HCC)    a. 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 DVT of left subclavian dx'd 06/17/2018; b. Eliquis   GERD (gastroesophageal reflux disease)    takes otc occasionally   History of complete heart block    a. s/p MDT PPM 05/2017   Hypertension    Obesity    Presence of permanent cardiac pacemaker    Dr. Rosette Reveal implanted 05/2017   PVC's (premature ventricular contractions)    a. PPM-induced    Assessment: Sean [name]. is a 82 y.o. male presenting with DVT. PMH significant for CKD, CAD, CHF,  gout, iCMP, AF. Patient was not on [facility] PTA per chart review. [place] positive for right femoropopliteal DVT extending into the calf veins. Pharmacy has been consulted to initiate and manage heparin infusion.   Baseline Labs: aPTT 35, PT 15.8, INR 1.2, Hgb 11.9, Hct 36.7, Plt 148   Goal of Therapy:  Heparin level 0.3-0.7 units/ml Monitor platelets by anticoagulation protocol: Yes   Plan:  Give 5800 units bolus x 1 Start heparin infusion at 1550 units/hr Check HL in 8 hours  Continue to monitor H&H and platelets daily while on heparin infusion   Celene Squibb, PharmD Clinical Pharmacist 06/12/2023 2:11 PM

## 2023-06-12 NOTE — Assessment & Plan Note (Signed)
-   We will continue his Toprol-XL while holding of his Cozaar and Aldactone given his AKI.

## 2023-06-12 NOTE — ED Provider Notes (Signed)
[facility] Provider Note   Event Date/Time   First MD Initiated Contact with Patient 06/12/23 1345     (approximate) History  DVT  HPI Sean [name]. is a 82 y.o. male with past medical history of CABG, CAD, hypertension, and peripheral artery disease who presents complaining of right lower extremity swelling as well as blood clot found on an outpatient evaluation.  Per patient's wife at bedside he reportedly was found to have a large occlusive clot in the right leg.  Patient is continuing to complain of of 5/10 thigh pain. ROS: Patient currently denies any vision changes, tinnitus, difficulty speaking, facial droop, sore throat, chest pain, shortness of breath, abdominal pain, nausea/vomiting/diarrhea, dysuria, or weakness/numbness/paresthesias in any extremity   Physical Exam  Triage Vital Signs: ED Triage Vitals  Encounter Vitals Group     BP 06/12/23 1337 (!) 146/81     Systolic BP Percentile --      Diastolic BP Percentile --      Pulse Rate 06/12/23 1337 78     Resp 06/12/23 1337 20     Temp 06/12/23 1337 98 F (36.7 C)     Temp Source 06/12/23 1337 Oral     SpO2 06/12/23 1337 100 %     Weight 06/12/23 1336 238 lb 1.6 oz (108 kg)     Height 06/12/23 1336 5\' 10"  (1.778 m)     Head Circumference --      Peak Flow --      Pain Score 06/12/23 1335 5     Pain Loc --      Pain Education --      Exclude from Growth Chart --    Most recent vital signs: Vitals:   06/12/23 1400 06/12/23 1430  BP: 137/82 (!) 135/95  Pulse: 69 70  Resp: 19 18  Temp:    SpO2: 98% 94%   General: Awake, oriented x4. CV:  Good peripheral perfusion.  Resp:  Normal effort.  Abd:  No distention.  Other:  Elderly obese Caucasian male laying in stretcher in no acute distress.  Bilateral lower extremity edema with right greatly larger than left as well as scattered erythema ED Results / Procedures / Treatments  Labs (all labs ordered are listed, but only abnormal  results are displayed) Labs Reviewed  CBC WITH DIFFERENTIAL/PLATELET - Abnormal; Notable for the following components:      Result Value   WBC 14.3 (*)    RBC 3.84 (*)    Hemoglobin 11.9 (*)    HCT 36.7 (*)    Platelets 148 (*)    Neutro Abs 11.2 (*)    Monocytes Absolute 1.3 (*)    Abs Immature Granulocytes 0.16 (*)    All other components within normal limits  COMPREHENSIVE METABOLIC PANEL - Abnormal; Notable for the following components:   Sodium 134 (*)    Glucose, Bld 133 (*)    BUN 82 (*)    Creatinine, Ser 4.75 (*)    AST 13 (*)    Total Bilirubin 1.4 (*)    GFR, Estimated 12 (*)    All other components within normal limits  PROTIME-INR - Abnormal; Notable for the following components:   Prothrombin Time 15.8 (*)    All other components within normal limits  APTT  HEPARIN LEVEL (UNFRACTIONATED)  TROPONIN I (HIGH SENSITIVITY)  TROPONIN I (HIGH SENSITIVITY)   RADIOLOGY ED MD interpretation: Right lower extremity Doppler ultrasound shows a right femoral-popliteal DVT extending into  the calf veins with proximal right GSV superficial thrombosis as well -Agree with radiology assessment Official radiology report(s): US Venous Img Lower Unilateral Right (DVT)  Result Date: 06/12/2023 CLINICAL DATA:  Right lower extremity pain and swelling since last Saturday EXAM: RIGHT LOWER EXTREMITY VENOUS DOPPLER ULTRASOUND TECHNIQUE: Gray-scale sonography with graded compression, as well as color Doppler and duplex ultrasound were performed to evaluate the lower extremity deep venous systems from the level of the common femoral vein and including the common femoral, femoral, profunda femoral, popliteal and calf veins including the posterior tibial, peroneal and gastrocnemius veins when visible. The superficial great saphenous vein was also interrogated. Spectral Doppler was utilized to evaluate flow at rest and with distal augmentation maneuvers in the common femoral, femoral and popliteal  veins. COMPARISON:  None Available. FINDINGS: Contralateral Common Femoral Vein: Respiratory phasicity is normal and symmetric with the symptomatic side. No evidence of thrombus. Normal compressibility. Common Femoral Vein: No evidence of thrombus. Normal compressibility, respiratory phasicity and response to augmentation. Saphenofemoral Junction: No evidence of thrombus. Normal compressibility and flow on color Doppler imaging. Profunda Femoral Vein: No evidence of thrombus. Normal compressibility and flow on color Doppler imaging. Femoral Vein: Mixed echogenicity intraluminal thrombus. Vessel noncompressible. Thrombus appears occlusive. Popliteal Vein: Similar intraluminal mixed echogenicity thrombus appearing nearly occlusive. Vessel partially compressible. Calf Veins: Thrombus does appear to extend into the tibial and peroneal calf veins to some degree with partial compressibility. Superficial Great Saphenous Vein: Proximal GSV superficial thrombosis noted without occlusion. Vessel partially compressible. IMPRESSION: Positive exam for right femoropopliteal DVT extending into the calf veins. Proximal right GSV superficial thrombosis as well. Electronically Signed   By: Judie Petit.  Shick M.D.   On: 06/12/2023 13:05   PROCEDURES: Critical Care performed: No .1-3 Lead EKG Interpretation  Performed by: Merwyn Katos, MD Authorized by: Merwyn Katos, MD     Interpretation: normal     ECG rate:  71   ECG rate assessment: normal     Rhythm: sinus rhythm     Ectopy: none     Conduction: normal    MEDICATIONS ORDERED IN ED: Medications  heparin ADULT infusion 100 units/mL (25000 units/24mL) (1,550 Units/hr Intravenous New Bag/Given 06/12/23 1435)  heparin bolus via infusion 5,800 Units (5,800 Units Intravenous Bolus from Bag 06/12/23 1432)   IMPRESSION / MDM / ASSESSMENT AND PLAN / ED COURSE  I reviewed the triage vital signs and the nursing notes.                             The patient is on the  cardiac monitor to evaluate for evidence of arrhythmia and/or significant heart rate changes. Patient's presentation is most consistent with acute presentation with potential threat to life or bodily function. Presents with unilateral leg swelling and pain as well as Doppler ultrasound of the right lower extremity positive for DVT Nontoxic appearing, VSS. No lymphangitic spread visible. No fluid pockets or fluctuance concerning for abscess noted. Low concern for cellulitis or osteomyelitis. No evidence of phlegmasia cerulea or alba dolens. Focal and unilateral nature not consistent with heart failure.  Workup CTA chest   Care of this patient will be signed out to the oncoming physician at the end of my shift.  All pertinent patient information conveyed and all questions answered.  All further care and disposition decisions will be made by the oncoming physician.   FINAL CLINICAL IMPRESSION(S) / ED DIAGNOSES   Final diagnoses:  DVT, lower extremity, proximal, acute, right (HCC)   Rx / DC Orders   ED Discharge Orders     None      Note:  This document was prepared using Dragon voice recognition software and may include unintentional dictation errors.   Merwyn Katos, MD 06/12/23 [phone]

## 2023-06-12 NOTE — Assessment & Plan Note (Signed)
-   The patient will be admitted to a progressive unit bed. - We will continue him on IV heparin. - Vascular surgery consult to be obtained.  Dr. Wyn Quaker was notified about the patient - Will obtain a 2D echo to assess for RV strain.

## 2023-06-12 NOTE — H&P (Addendum)
[place]   PATIENT NAME: Sean [name]    MR#:  [id number]  DATE OF BIRTH:  1941/10/27  DATE OF ADMISSION:  06/12/2023  PRIMARY CARE PHYSICIAN: Jerl Mina, MD   Patient is coming from: Home  REQUESTING/REFERRING PHYSICIAN: Trinna Post, MD   CHIEF COMPLAINT:   Chief Complaint  Patient presents with   DVT    HISTORY OF PRESENT ILLNESS:  Sean Lanzer. is a 82 y.o. Caucasian male with medical history significant for coronary artery disease, asthma, osteoarthritis, chronic systolic CHF, stage III chronic kidney disease and DVT of the left upper extremity, history of complete heart block status post PPM and hypertension, who presented to the emergency room with acute onset of right leg pain and swelling for which the patient had an outpatient venous Doppler that showed right-sided extensive DVT.  The patient had heartburn last night that he thought reflux.  He denied any cough or dyspnea or wheezing or hemoptysis.  No chest pain or palpitations.  No bleeding diathesis.  No dysuria, oliguria or hematuria or flank pain.  No nausea or vomiting or diarrhea.  He has been having slightly diminished urine output.  ED Course: When he came to the ER, BP was 146/81 with otherwise normal vital signs.  Labs revealed mild hyponatremia 134 and potassium 5.1, BUN of 82 and creatinine 4.75 up from 34/1.74 on 01/12/2023.  Total bili was 1.4 and LFTs were within normal otherwise.  High sensitive troponin I was 3 and later the same.  CBC showed leukocytosis of 14.3 with neutrophilia and mild anemia and thrombocytopenia.  PT was 15.8 and INR 1.2 with PTT of 35.. EKG as reviewed by me : EKG showed sinus rhythm with a rate of 70 with intraventricular conduction delay and possibly developing left bundle branch block. Imaging: 2 view chest x-ray showed his median sternotomy and left upper chest pacemaker with no consolidation or edema.  V/Q scan revealed high probability for PE.  The patient was started  on IV heparin with bolus and drip.  Dr. Wyn Quaker was contacted about the patient.  He thought there was no need for chest CTA.  The patient be admitted to a progressive unit bed for further evaluation and management. PAST MEDICAL HISTORY:   Past Medical History:  Diagnosis Date   Arthritis    Asthma    CAD (coronary artery disease)    a. LHC 7/19: ostLM 30%, ostLAD 30%, p-mLAD 99% mod calcified, ostD1 90%, ost-pLCx 80% ulcerative & mod calcified, p-mLCx 50%, OM2 50%, mod dz LPDA, RCA small w/ diffuse dz throughout; b. recommendation of CABG; c. 3-V CABG 08/26/18 (LIMA-LAD, VG-Diag, VG-LCx)   Chronic systolic CHF (congestive heart failure) (HCC)    a. 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 DVT of left subclavian dx'd 06/17/2018; b. Eliquis   GERD (gastroesophageal reflux disease)    takes otc occasionally   History of complete heart block    a. s/p MDT PPM 05/2017   Hypertension    Obesity    Presence of permanent cardiac pacemaker    Dr. Rosette Reveal implanted 05/2017   PVC's (premature ventricular contractions)    a. PPM-induced    PAST SURGICAL HISTORY:  Past Surgical History:  Procedure Laterality Date   A-FLUTTER ABLATION N/A 03/03/2020   Procedure: A-FLUTTER ABLATION;  Surgeon: Duke Salvia, MD;  Location: [facility] INVASIVE CV LAB;  Service: Cardiovascular;  Laterality: N/A;   BACK SURGERY     CORONARY ARTERY BYPASS GRAFT N/A 08/26/2018   Procedure: CORONARY ARTERY BYPASS GRAFTING (CABG) x3 , Using left internal mammory artery and right leg greater saphronious vein. Harvested endoscopically.;  Surgeon: Kerin Perna, MD;  Location: [facility] OR;  Service: Open Heart Surgery;  Laterality: N/A;   EPICARDIAL PACING LEAD PLACEMENT N/A 08/26/2018   Procedure: LV PACING  LEAD PLACEMENT;  Surgeon: Kerin Perna, MD;  Location: [facility] OR;  Service: Thoracic;  Laterality: N/A;   EPICARDIAL PACING LEAD PLACEMENT Left 11/07/2018   Procedure: REVISE PACEMAKER LEAD LEFT CHEST;  Surgeon: Kerin Perna, MD;  Location: [facility] OR;  Service: Thoracic;  Laterality: Left;   INSERT / REPLACE / REMOVE PACEMAKER     [employer]   LEFT HEART CATH AND CORONARY ANGIOGRAPHY N/A 06/13/2018   Procedure: LEFT HEART CATH AND CORONARY ANGIOGRAPHY;  Surgeon: Iran Ouch, MD;  Location: [facility] INVASIVE CV LAB;  Service: Cardiovascular;  Laterality: N/A;   LEG SURGERY     PACEMAKER IMPLANT N/A 06/11/2017   Procedure: Pacemaker Implant;  Surgeon: Marinus Maw, MD;  Location: [facility] INVASIVE CV LAB;  Service: Cardiovascular;  Laterality: N/A;   PLEURAL EFFUSION DRAINAGE Left 09/24/2018   Procedure: DRAINAGE OF LOCULATED PLEURAL EFFUSION;  Surgeon: Kerin Perna, MD;  Location: [facility] OR;  Service: Thoracic;  Laterality: Left;   TEE WITHOUT CARDIOVERSION N/A 08/26/2018   Procedure: TRANSESOPHAGEAL ECHOCARDIOGRAM (TEE);  Surgeon: Donata Clay, Theron Arista, MD;  Location: [facility] OR;  Service: Open Heart Surgery;  Laterality: N/A;   TEE WITHOUT CARDIOVERSION  03/03/2020   Procedure: Transesophageal Echocardiogram (Tee);  Surgeon: Duke Salvia, MD;  Location: [facility] INVASIVE CV LAB;  Service: Cardiovascular;;   VIDEO ASSISTED THORACOSCOPY Left 09/24/2018   Procedure: VIDEO ASSISTED THORACOSCOPY;  Surgeon: Donata Clay, Theron Arista, MD;  Location: [facility] OR;  Service: Thoracic;  Laterality: Left;    SOCIAL HISTORY:   Social History   Tobacco Use   Smoking status: Never   Smokeless tobacco: Never  Substance Use Topics   Alcohol use: Yes    Comment: occ.    FAMILY HISTORY:   Family History  Problem Relation Age of Onset   Stroke Mother    Stroke Father    Alcohol abuse Father    Diabetes Brother    Alcohol abuse Brother    Stroke Maternal Grandfather     DRUG ALLERGIES:   Allergies  Allergen Reactions    Azithromycin Hives   Erythromycin Hives    REVIEW OF SYSTEMS:   ROS As per history of present illness. All pertinent systems were reviewed above. Constitutional, HEENT, cardiovascular, respiratory, GI, GU, musculoskeletal, neuro, psychiatric, endocrine, integumentary and hematologic systems were reviewed and are otherwise negative/unremarkable except for positive findings mentioned above in the HPI.   MEDICATIONS AT HOME:   Prior to Admission medications   Medication Sig Start Date End Date Taking? Authorizing Provider  acetaminophen (TYLENOL) 500 MG tablet Take 500 mg by mouth every 6 (six) hours as needed for moderate pain or headache.   Yes [provider]  allopurinol (ZYLOPRIM) 100 MG tablet Take 100 mg by mouth every other day. At night   Yes [provider]  aspirin EC 81 MG tablet Take 1 tablet (81 mg total) by mouth  daily. Swallow whole. 10/10/22  Yes Iran Ouch, MD  atorvastatin (LIPITOR) 40 MG tablet TAKE ONE TABLET EVERY DAY 01/01/23  Yes Duke Salvia, MD  EPINEPHrine (PRIMATENE MIST) 0.125 MG/ACT AERO Inhale 1 puff into the lungs daily as needed (shortness of breath).   Yes [provider]  loratadine (CLARITIN) 10 MG tablet Take 1 tablet (10 mg total) by mouth daily. 05/04/23  Yes Hammock, Lavonna Rua, NP  losartan (COZAAR) 25 MG tablet TAKE ONE TABLET (25 MG) BY MOUTH EVERY DAY 03/09/23  Yes Duke Salvia, MD  metoprolol succinate (TOPROL-XL) 25 MG 24 hr tablet TAKE 1 TABLET BY MOUTH DAILY 04/10/23  Yes Iran Ouch, MD  Multiple Vitamin (MULTIVITAMIN WITH MINERALS) TABS tablet Take 1 tablet by mouth daily.   Yes [provider]  Specialty Vitamins Products (PROSTATE PO) Take 1 tablet by mouth at bedtime.    Yes [provider]  spironolactone (ALDACTONE) 25 MG tablet Take 0.5 tablet (12.5 mg) by mouth once daily 12/21/22  Yes Duke Salvia, MD  tamsulosin (FLOMAX) 0.4 MG CAPS capsule Take 0.4 mg by mouth daily. 09/15/20  Yes  [provider]      VITAL SIGNS:  Blood pressure (!) 159/89, pulse 70, temperature 98 F (36.7 C), temperature source Oral, resp. rate (!) 22, height 5\' 10"  (1.778 m), weight 108 kg, SpO2 97%.  PHYSICAL EXAMINATION:  Physical Exam  GENERAL:  82 y.o.-year-old Caucasian male patient lying in the bed with no acute distress.  EYES: Pupils equal, round, reactive to light and accommodation. No scleral icterus. Extraocular muscles intact.  HEENT: Head atraumatic, normocephalic. Oropharynx and nasopharynx clear.  NECK:  Supple, no jugular venous distention. No thyroid enlargement, no tenderness.  LUNGS: Normal breath sounds bilaterally, no wheezing, rales,rhonchi or crepitation. No use of accessory muscles of respiration.  CARDIOVASCULAR: Regular rate and rhythm, S1, S2 normal. No murmurs, rubs, or gallops.  ABDOMEN: Soft, nondistended, nontender. Bowel sounds present. No organomegaly or mass.  EXTREMITIES: Bilateral lower extremity edema, with no cyanosis, or clubbing.  He has right greater than left lower extremities swelling with scattered erythema. NEUROLOGIC: Cranial nerves II through XII are intact. Muscle strength 5/5 in all extremities. Sensation intact. Gait not checked.  PSYCHIATRIC: The patient is alert and oriented x 3.  Normal affect and good eye contact. SKIN: No obvious rash, lesion, or ulcer.   LABORATORY PANEL:   CBC Recent Labs  Lab 06/12/23 1414  WBC 14.3*  HGB 11.9*  HCT 36.7*  PLT 148*   ------------------------------------------------------------------------------------------------------------------  Chemistries  Recent Labs  Lab 06/12/23 1414  NA 134*  K 5.1  CL 102  CO2 22  GLUCOSE 133*  BUN 82*  CREATININE 4.75*  CALCIUM 9.6  AST 13*  ALT 9  ALKPHOS 44  BILITOT 1.4*   ------------------------------------------------------------------------------------------------------------------  Cardiac Enzymes No results for input(s): "TROPONINI"  in the last 168 hours. ------------------------------------------------------------------------------------------------------------------  RADIOLOGY:  NM Pulmonary Perfusion  Result Date: 06/12/2023 CLINICAL DATA:  Pulmonary embolism. EXAM: NUCLEAR MEDICINE PERFUSION LUNG SCAN Two-view chest x-ray TECHNIQUE: Perfusion images were obtained in multiple projections after intravenous injection of radiopharmaceutical. Ventilation scans intentionally deferred if perfusion scan and chest x-ray adequate for interpretation during COVID 19 epidemic. Standard two-view sitting chest x-ray obtained. RADIOPHARMACEUTICALS:  4.31 mCi Tc-50m MAA IV COMPARISON:  None Available. FINDINGS: Chest x-ray demonstrates status post median sternotomy. Left upper chest pacemaker with a additional leads. No consolidation, pneumothorax or effusion. No edema. On perfusion there are several large defects identified  including left upper lobe, bilateral lower lobes peripherally. IMPRESSION: High probability perfusion scan. Critical Value/emergent results were called by telephone at the time of interpretation on 06/12/2023 at 1:59 pm to provider NEHA RAY ; Donna Bernard , who verbally acknowledged these results. Electronically Signed   By: Karen Kays M.D.   On: 06/12/2023 17:12   DG Chest 2 View  Result Date: 06/12/2023 CLINICAL DATA:  Pulmonary embolism. EXAM: NUCLEAR MEDICINE PERFUSION LUNG SCAN Two-view chest x-ray TECHNIQUE: Perfusion images were obtained in multiple projections after intravenous injection of radiopharmaceutical. Ventilation scans intentionally deferred if perfusion scan and chest x-ray adequate for interpretation during COVID 19 epidemic. Standard two-view sitting chest x-ray obtained. RADIOPHARMACEUTICALS:  4.31 mCi Tc-94m MAA IV COMPARISON:  None Available. FINDINGS: Chest x-ray demonstrates status post median sternotomy. Left upper chest pacemaker with a additional leads. No consolidation, pneumothorax or effusion.  No edema. On perfusion there are several large defects identified including left upper lobe, bilateral lower lobes peripherally. IMPRESSION: High probability perfusion scan. Critical Value/emergent results were called by telephone at the time of interpretation on 06/12/2023 at 1:59 pm to provider NEHA RAY ; Donna Bernard , who verbally acknowledged these results. Electronically Signed   By: Karen Kays M.D.   On: 06/12/2023 17:12   US Venous Img Lower Unilateral Right (DVT)  Result Date: 06/12/2023 CLINICAL DATA:  Right lower extremity pain and swelling since last Saturday EXAM: RIGHT LOWER EXTREMITY VENOUS DOPPLER ULTRASOUND TECHNIQUE: Gray-scale sonography with graded compression, as well as color Doppler and duplex ultrasound were performed to evaluate the lower extremity deep venous systems from the level of the common femoral vein and including the common femoral, femoral, profunda femoral, popliteal and calf veins including the posterior tibial, peroneal and gastrocnemius veins when visible. The superficial great saphenous vein was also interrogated. Spectral Doppler was utilized to evaluate flow at rest and with distal augmentation maneuvers in the common femoral, femoral and popliteal veins. COMPARISON:  None Available. FINDINGS: Contralateral Common Femoral Vein: Respiratory phasicity is normal and symmetric with the symptomatic side. No evidence of thrombus. Normal compressibility. Common Femoral Vein: No evidence of thrombus. Normal compressibility, respiratory phasicity and response to augmentation. Saphenofemoral Junction: No evidence of thrombus. Normal compressibility and flow on color Doppler imaging. Profunda Femoral Vein: No evidence of thrombus. Normal compressibility and flow on color Doppler imaging. Femoral Vein: Mixed echogenicity intraluminal thrombus. Vessel noncompressible. Thrombus appears occlusive. Popliteal Vein: Similar intraluminal mixed echogenicity thrombus appearing nearly  occlusive. Vessel partially compressible. Calf Veins: Thrombus does appear to extend into the tibial and peroneal calf veins to some degree with partial compressibility. Superficial Great Saphenous Vein: Proximal GSV superficial thrombosis noted without occlusion. Vessel partially compressible. IMPRESSION: Positive exam for right femoropopliteal DVT extending into the calf veins. Proximal right GSV superficial thrombosis as well. Electronically Signed   By: Judie Petit.  Shick M.D.   On: 06/12/2023 13:05      IMPRESSION AND PLAN:  Assessment and Plan: * Acute pulmonary embolism (HCC) - The patient will be admitted to a progressive unit bed. - We will continue him on IV heparin. - Vascular surgery consult to be obtained.  Dr. Wyn Quaker was notified about the patient - Will obtain a 2D echo to assess for RV strain.  DVT, lower extremity, proximal, acute, right (HCC) - This is likely the culprit for #1. - Management as above.  Acute kidney injury superimposed on chronic kidney disease (HCC) - This is AKI superimposed on stage IIIb CKD. - We will continue hydration  with IV normal saline. - We will hold off Aldactone and Cozaar. - Will follow BMPs.  Essential hypertension - We will continue his Toprol-XL while holding of his Cozaar and Aldactone given his AKI.  Dyslipidemia - We will continue statin therapy.  BPH (benign prostatic hyperplasia) - We will continue Flomax.  Gout - We will continue allopurinol.  DVT prophylaxis: IV heparin.   Advanced Care Planning:  Code Status: The patient is DNR only. Family Communication:  The plan of care was discussed in details with the patient (and family). I answered all questions. The patient agreed to proceed with the above mentioned plan. Further management will depend upon hospital course. Disposition Plan: Back to previous home environment Consults called: Vascular surgery. All the records are reviewed and case discussed with ED provider.  Status is:  Inpatient  At the time of the admission, it appears that the appropriate admission status for this patient is inpatient.  This is judged to be reasonable and necessary in order to provide the required intensity of service to ensure the patient's safety given the presenting symptoms, physical exam findings and initial radiographic and laboratory data in the context of comorbid conditions.  The patient requires inpatient status due to high intensity of service, high risk of further deterioration and high frequency of surveillance required.  I certify that at the time of admission, it is my clinical judgment that the patient will require inpatient hospital care extending more than 2 midnights.                            Dispo: The patient is from: Home              Anticipated d/c is to: Home              Patient currently is not medically stable to d/c.              Difficult to place patient: No  Hannah Beat M.D on 06/12/2023 at 7:39 PM  [facility]   From 7 PM-7 AM, contact night-coverage [email or web address]  CC: Primary care physician; Jerl Mina, MD

## 2023-06-12 NOTE — Assessment & Plan Note (Signed)
-  This is likely the culprit for #1. - Management as above.

## 2023-06-12 NOTE — ED Notes (Signed)
Post void bladder scan performed. 905 mL in bladder.

## 2023-06-12 NOTE — Assessment & Plan Note (Signed)
-   This is AKI superimposed on stage IIIb CKD. - We will continue hydration with IV normal saline. - We will hold off Aldactone and Cozaar. - Will follow BMPs.

## 2023-06-12 NOTE — ED Notes (Signed)
Pt denies pain but frequently requesting to be repositioned and sit at edge of bed.

## 2023-06-12 NOTE — Assessment & Plan Note (Signed)
-   We will continue Flomax 

## 2023-06-12 NOTE — Assessment & Plan Note (Signed)
-   We will continue allopurinol 

## 2023-06-13 ENCOUNTER — Encounter
Admission: EM | Disposition: A | Discharge status: Home / Self Care | Payer: Self-pay | Source: Home / Self Care | Attending: Internal Medicine

## 2023-06-13 ENCOUNTER — Other Ambulatory Visit (HOSPITAL_COMMUNITY): Payer: Self-pay

## 2023-06-13 ENCOUNTER — Encounter: Payer: Self-pay | Admitting: Family Medicine

## 2023-06-13 DIAGNOSIS — I82431 Acute embolism and thrombosis of right popliteal vein: Secondary | ICD-10-CM

## 2023-06-13 DIAGNOSIS — I82411 Acute embolism and thrombosis of right femoral vein: Secondary | ICD-10-CM

## 2023-06-13 DIAGNOSIS — N19 Unspecified kidney failure: Secondary | ICD-10-CM

## 2023-06-13 DIAGNOSIS — I2699 Other pulmonary embolism without acute cor pulmonale: Secondary | ICD-10-CM | POA: Diagnosis not present

## 2023-06-13 DIAGNOSIS — I824Y1 Acute embolism and thrombosis of unspecified deep veins of right proximal lower extremity: Secondary | ICD-10-CM | POA: Diagnosis not present

## 2023-06-13 HISTORY — PX: PERIPHERAL VASCULAR THROMBECTOMY: CATH118306

## 2023-06-13 LAB — BASIC METABOLIC PANEL
Anion gap: 6 (ref 5–15)
BUN: 84 mg/dL — ABNORMAL HIGH (ref 8–23)
CO2: 23 mmol/L (ref 22–32)
Calcium: 8.8 mg/dL — ABNORMAL LOW (ref 8.9–10.3)
Chloride: 106 mmol/L (ref 98–111)
Creatinine, Ser: 4.53 mg/dL — ABNORMAL HIGH (ref 0.61–1.24)
GFR, Estimated: 12 mL/min — ABNORMAL LOW (ref >60)
Glucose, Bld: 134 mg/dL — ABNORMAL HIGH (ref 70–99)
Potassium: 4.8 mmol/L (ref 3.5–5.1)
Sodium: 135 mmol/L (ref 135–145)

## 2023-06-13 LAB — CBC
HCT: 31 % — ABNORMAL LOW (ref 39.0–52.0)
Hemoglobin: 10.5 g/dL — ABNORMAL LOW (ref 13.0–17.0)
MCH: 31.4 pg (ref 26.0–34.0)
MCHC: 33.9 g/dL (ref 30.0–36.0)
MCV: 92.8 fL (ref 80.0–100.0)
Platelets: 148 10*3/uL — ABNORMAL LOW (ref 150–400)
RBC: 3.34 MIL/uL — ABNORMAL LOW (ref 4.22–5.81)
RDW: 12.2 % (ref 11.5–15.5)
WBC: 12 10*3/uL — ABNORMAL HIGH (ref 4.0–10.5)
nRBC: 0 % (ref 0.0–0.2)

## 2023-06-13 LAB — CK: Total CK: 43 U/L — ABNORMAL LOW (ref 49–397)

## 2023-06-13 LAB — HEPARIN LEVEL (UNFRACTIONATED): Heparin Unfractionated: 0.59 IU/mL (ref 0.30–0.70)

## 2023-06-13 SURGERY — PERIPHERAL VASCULAR THROMBECTOMY
Anesthesia: Moderate Sedation | Laterality: Right

## 2023-06-13 MED ORDER — FENTANYL CITRATE (PF) 100 MCG/2ML IJ SOLN
INTRAMUSCULAR | Status: DC | PRN
  Administered 2023-06-13: 50 ug via INTRAVENOUS

## 2023-06-13 MED ORDER — CEFAZOLIN SODIUM-DEXTROSE 2-4 GM/100ML-% IV SOLN: 2.0000 g | INTRAVENOUS | Status: DC

## 2023-06-13 MED ORDER — FENTANYL CITRATE PF 50 MCG/ML IJ SOSY: 12.5000 ug | PREFILLED_SYRINGE | Freq: Once | INTRAMUSCULAR | Status: DC | PRN

## 2023-06-13 MED ORDER — SODIUM CHLORIDE 0.9 % IV SOLN: INTRAVENOUS | Status: DC

## 2023-06-13 MED ORDER — FAMOTIDINE 20 MG PO TABS: 40.0000 mg | ORAL_TABLET | Freq: Once | ORAL | Status: DC | PRN

## 2023-06-13 MED ORDER — CHLORHEXIDINE GLUCONATE CLOTH 2 % EX PADS
6.0000 | MEDICATED_PAD | Freq: Every day | CUTANEOUS | Status: DC
  Administered 2023-06-14 – 2023-06-16 (×3): 6 via TOPICAL

## 2023-06-13 MED ORDER — LIDOCAINE-EPINEPHRINE (PF) 1 %-1:200000 IJ SOLN
INTRAMUSCULAR | Status: DC | PRN
  Administered 2023-06-13: 10 mL

## 2023-06-13 MED ORDER — MIDAZOLAM HCL 2 MG/2ML IJ SOLN
INTRAMUSCULAR | Status: AC
  Filled 2023-06-13: qty 2

## 2023-06-13 MED ORDER — HYDROMORPHONE HCL 1 MG/ML IJ SOLN: 1.0000 mg | Freq: Once | INTRAMUSCULAR | Status: DC | PRN

## 2023-06-13 MED ORDER — ONDANSETRON HCL 4 MG/2ML IJ SOLN: 4.0000 mg | Freq: Four times a day (QID) | INTRAMUSCULAR | Status: DC | PRN

## 2023-06-13 MED ORDER — DIPHENHYDRAMINE HCL 50 MG/ML IJ SOLN: 50.0000 mg | Freq: Once | INTRAMUSCULAR | Status: DC | PRN

## 2023-06-13 MED ORDER — FENTANYL CITRATE PF 50 MCG/ML IJ SOSY
PREFILLED_SYRINGE | INTRAMUSCULAR | Status: AC
  Filled 2023-06-13: qty 1

## 2023-06-13 MED ORDER — METHYLPREDNISOLONE SODIUM SUCC 125 MG IJ SOLR: 125.0000 mg | Freq: Once | INTRAMUSCULAR | Status: DC | PRN

## 2023-06-13 MED ORDER — CEFAZOLIN SODIUM-DEXTROSE 2-4 GM/100ML-% IV SOLN
INTRAVENOUS | Status: AC
  Filled 2023-06-13: qty 100

## 2023-06-13 MED ORDER — MIDAZOLAM HCL 2 MG/2ML IJ SOLN
INTRAMUSCULAR | Status: DC | PRN
  Administered 2023-06-13: 1 mg via INTRAVENOUS

## 2023-06-13 MED ORDER — CEFAZOLIN SODIUM-DEXTROSE 1-4 GM/50ML-% IV SOLN
INTRAVENOUS | Status: DC | PRN
  Administered 2023-06-13: 2 g via INTRAVENOUS

## 2023-06-13 MED ORDER — HEPARIN SODIUM (PORCINE) 1000 UNIT/ML IJ SOLN
INTRAMUSCULAR | Status: DC | PRN
  Administered 2023-06-13: 1000 [IU] via INTRAVENOUS

## 2023-06-13 MED ORDER — IODIXANOL 320 MG/ML IV SOLN
INTRAVENOUS | Status: DC | PRN
  Administered 2023-06-13: 15 mL via INTRAVENOUS

## 2023-06-13 MED ORDER — HEPARIN SODIUM (PORCINE) 1000 UNIT/ML IJ SOLN
INTRAMUSCULAR | Status: AC
  Filled 2023-06-13: qty 10

## 2023-06-13 MED ORDER — MIDAZOLAM HCL 2 MG/ML PO SYRP
8.0000 mg | ORAL_SOLUTION | Freq: Once | ORAL | Status: DC | PRN
  Filled 2023-06-13: qty 5

## 2023-06-13 SURGICAL SUPPLY — 15 items
CANISTER PENUMBRA ENGINE (MISCELLANEOUS) IMPLANT
CANNULA 5F STIFF (CANNULA) IMPLANT
CATH LIGHTNI FLASH 16XTORQ 100 (CATHETERS) IMPLANT
CATH LIGHTNING FLASH XTORQ 100 (CATHETERS) ×1
CLOSURE PERCLOSE PROSTYLE (VASCULAR PRODUCTS) IMPLANT
COVER EZ STRL 42X30 (DRAPES) IMPLANT
COVER PROBE ULTRASOUND 5X96 (MISCELLANEOUS) IMPLANT
KIT POP OPTION ELITE FILTER (Filter) IMPLANT
PACK ANGIOGRAPHY (CUSTOM PROCEDURE TRAY) ×1 IMPLANT
SHEATH BRITE TIP 6FRX11 (SHEATH) IMPLANT
SHEATH BRITE TIP 6FRX5.5 (SHEATH) IMPLANT
SHEATH DRYSEAL FLEX 16FR 33CM (SHEATH) IMPLANT
SUT MNCRL AB 4-0 PS2 18 (SUTURE) IMPLANT
WIRE GUIDERIGHT .035X150 (WIRE) IMPLANT
WIRE SUPRACORE 190CM (WIRE) IMPLANT

## 2023-06-13 NOTE — Progress Notes (Signed)
Dr. Wyn Quaker at bedside, speaking with pt. And family re: procedural results. Pt. And family verbalized understanding of conversation with MD.

## 2023-06-13 NOTE — Consult Note (Signed)
ANTICOAGULATION CONSULT NOTE   Pharmacy Consult for Heparin Infusion Indication: DVT  Allergies  Allergen Reactions   Azithromycin Hives   Erythromycin Hives    Patient Measurements: Height: 5\' 10"  (177.8 cm) Weight: 108 kg (238 lb 1.6 oz) IBW/kg (Calculated) : 73 Heparin Dosing Weight: 96.3 kg  Vital Signs: Temp: 98.2 F (36.8 C) (07/24 0746) Temp Source: Oral (07/24 0746) BP: 159/100 (07/24 0830) Pulse Rate: 83 (07/24 0830)  Labs: Recent Labs    06/12/23 1414 06/12/23 1828 06/12/23 2335 06/13/23 0442  HGB 11.9*  --   --  10.5*  HCT 36.7*  --   --  31.0*  PLT 148*  --   --  148*  APTT 35  --   --   --   LABPROT 15.8*  --   --   --   INR 1.2  --   --   --   HEPARINUNFRC  --   --  0.88*  --   CREATININE 4.75*  --   --  4.53*  TROPONINIHS 3 3  --   --     Estimated Creatinine Clearance: 15.7 mL/min (A) (by C-G formula based on SCr of 4.53 mg/dL (H)).   Medical History: Past Medical History:  Diagnosis Date   Arthritis    Asthma    CAD (coronary artery disease)    a. LHC 7/19: ostLM 30%, ostLAD 30%, p-mLAD 99% mod calcified, ostD1 90%, ost-pLCx 80% ulcerative & mod calcified, p-mLCx 50%, OM2 50%, mod dz LPDA, RCA small w/ diffuse dz throughout; b. recommendation of CABG; c. 3-V CABG 08/26/18 (LIMA-LAD, VG-Diag, VG-LCx)   Chronic systolic CHF (congestive heart failure) (HCC)    a. 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 DVT of left subclavian dx'd 06/17/2018; b. Eliquis   GERD (gastroesophageal reflux disease)    takes otc occasionally   History of complete heart block    a. s/p MDT PPM 05/2017   Hypertension    Obesity    Presence of permanent cardiac pacemaker    Dr. Rosette Reveal implanted 05/2017   PVC's (premature ventricular  contractions)    a. PPM-induced    Assessment: Sean [name]. is a 82 y.o. male presenting with DVT. PMH significant for CKD, CAD, CHF, gout, iCMP, AF. Patient was not on [facility] PTA per chart review. [place] positive for right femoropopliteal DVT extending into the calf veins. Pharmacy has been consulted to initiate and manage heparin infusion.   Baseline Labs: aPTT 35, PT 15.8, INR 1.2, Hgb 11.9, Hct 36.7, Plt 148   Goal of Therapy:  Heparin level 0.3-0.7 units/ml Monitor platelets by anticoagulation protocol: Yes  Plan: heparin level therapeutic x 1 continue heparin infusion at 1350 units/hr Recheck heparin level in 8 hours to confirm Continue to monitor H&H and platelets daily while on heparin infusion   Burnis Medin, PharmD, BCPS 06/13/2023 9:51 AM

## 2023-06-13 NOTE — Consult Note (Signed)
Hospital Consult    Reason for Consult:  Right Lower Extremity DVT with PE Requesting Physician:  Dr Valente David MD  MRN #:  [id number]  History of Present Illness: This is a 82 y.o. male with medical history significant for coronary artery disease, asthma, osteoarthritis, chronic systolic CHF, stage III chronic kidney disease and DVT of the left upper extremity, history of complete heart block status post PPM and hypertension, who presented to the emergency room with acute onset of right leg pain and swelling for which the patient had an outpatient venous Doppler that showed right-sided extensive DVT. Upon further work up he underwent a VQ scan which showed high probability of pulmonary embolism. Vascular surgery consulted to evaluate.   On exam this morning the patient is resting comfortably on the stretcher. He denies any shortness of breath or chest pain. He does endorse right lower extremity edema with pain on ambulation. He is not sure when this all started. He remains on room air with oxygen saturations at 98-100%. Patient is not a good historian and wishes we speak with his wife when she arrives later this morning.   Past Medical History:  Diagnosis Date   Arthritis    Asthma    CAD (coronary artery disease)    a. LHC 7/19: ostLM 30%, ostLAD 30%, p-mLAD 99% mod calcified, ostD1 90%, ost-pLCx 80% ulcerative & mod calcified, p-mLCx 50%, OM2 50%, mod dz LPDA, RCA small w/ diffuse dz throughout; b. recommendation of CABG; c. 3-V CABG 08/26/18 (LIMA-LAD, VG-Diag, VG-LCx)   Chronic systolic CHF (congestive heart failure) (HCC)    a. 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 DVT of left subclavian dx'd 06/17/2018; b. Eliquis   GERD (gastroesophageal reflux disease)     takes otc occasionally   History of complete heart block    a. s/p MDT PPM 05/2017   Hypertension    Obesity    Presence of permanent cardiac pacemaker    Dr. Rosette Reveal implanted 05/2017   PVC's (premature ventricular contractions)    a. PPM-induced    Past Surgical History:  Procedure Laterality Date   A-FLUTTER ABLATION N/A 03/03/2020   Procedure: A-FLUTTER ABLATION;  Surgeon: Duke Salvia, MD;  Location: [facility] INVASIVE CV LAB;  Service: Cardiovascular;  Laterality: N/A;   BACK SURGERY     CORONARY ARTERY BYPASS GRAFT N/A 08/26/2018   Procedure: CORONARY ARTERY BYPASS GRAFTING (CABG) x3 , Using left internal mammory artery and right leg greater saphronious vein. Harvested endoscopically.;  Surgeon: Kerin Perna, MD;  Location: [facility] OR;  Service: Open Heart Surgery;  Laterality: N/A;   EPICARDIAL PACING LEAD PLACEMENT N/A 08/26/2018   Procedure: LV PACING LEAD PLACEMENT;  Surgeon: Kerin Perna, MD;  Location: [facility] OR;  Service: Thoracic;  Laterality: N/A;   EPICARDIAL PACING LEAD PLACEMENT Left 11/07/2018   Procedure: REVISE PACEMAKER LEAD LEFT CHEST;  Surgeon: Kerin Perna, MD;  Location: [facility] OR;  Service: Thoracic;  Laterality: Left;   INSERT / REPLACE / REMOVE PACEMAKER     [employer]   LEFT HEART CATH AND CORONARY ANGIOGRAPHY N/A 06/13/2018   Procedure: LEFT HEART CATH AND CORONARY ANGIOGRAPHY;  Surgeon: Iran Ouch, MD;  Location: [facility] INVASIVE CV LAB;  Service: Cardiovascular;  Laterality: N/A;   LEG SURGERY     PACEMAKER IMPLANT N/A 06/11/2017   Procedure: Pacemaker Implant;  Surgeon: Marinus Maw, MD;  Location: [facility] INVASIVE CV LAB;  Service: Cardiovascular;  Laterality: N/A;   PLEURAL EFFUSION DRAINAGE Left 09/24/2018   Procedure: DRAINAGE OF LOCULATED PLEURAL EFFUSION;  Surgeon: Kerin Perna, MD;  Location: [facility] OR;  Service: Thoracic;  Laterality: Left;   TEE WITHOUT CARDIOVERSION N/A 08/26/2018   Procedure: TRANSESOPHAGEAL ECHOCARDIOGRAM (TEE);  Surgeon: Donata Clay, Theron Arista, MD;  Location: [facility] OR;  Service: Open Heart Surgery;  Laterality: N/A;   TEE WITHOUT CARDIOVERSION  03/03/2020   Procedure: Transesophageal Echocardiogram (Tee);  Surgeon: Duke Salvia, MD;  Location: [facility] INVASIVE CV LAB;  Service: Cardiovascular;;   VIDEO ASSISTED THORACOSCOPY Left 09/24/2018   Procedure: VIDEO ASSISTED THORACOSCOPY;  Surgeon: Kerin Perna, MD;  Location: [facility] OR;  Service: Thoracic;  Laterality: Left;    Allergies  Allergen Reactions   Azithromycin Hives   Erythromycin Hives    Prior to Admission medications   Medication Sig Start Date End Date Taking? Authorizing Provider  acetaminophen (TYLENOL) 500 MG tablet Take 500 mg by mouth every 6 (six) hours as needed for moderate pain or headache.   Yes [provider]  allopurinol (ZYLOPRIM) 100 MG tablet Take 100 mg by mouth every other day. At night   Yes [provider]  aspirin EC 81 MG tablet Take 1 tablet (81 mg total) by mouth daily. Swallow whole. 10/10/22  Yes Iran Ouch, MD  atorvastatin (LIPITOR) 40 MG tablet TAKE ONE TABLET EVERY DAY 01/01/23  Yes Duke Salvia, MD  EPINEPHrine (PRIMATENE MIST) 0.125 MG/ACT AERO Inhale 1 puff into the lungs daily as needed (shortness of breath).   Yes [provider]  loratadine (CLARITIN) 10 MG tablet Take 1 tablet (10 mg total) by mouth daily. 05/04/23  Yes Hammock, Lavonna Rua, NP  losartan (COZAAR) 25 MG tablet TAKE ONE TABLET (25 MG) BY MOUTH EVERY DAY 03/09/23  Yes Duke Salvia, MD  metoprolol succinate (TOPROL-XL) 25 MG 24 hr tablet TAKE 1 TABLET BY MOUTH DAILY 04/10/23  Yes Iran Ouch, MD  Multiple Vitamin (MULTIVITAMIN WITH MINERALS) TABS tablet Take 1 tablet by mouth daily.   Yes [provider]  Specialty Vitamins Products (PROSTATE PO) Take 1 tablet by mouth at bedtime.    Yes [provider]  spironolactone (ALDACTONE) 25 MG tablet Take 0.5 tablet (12.5 mg) by mouth once daily 12/21/22  Yes Duke Salvia, MD  tamsulosin (FLOMAX) 0.4 MG CAPS capsule Take 0.4 mg by mouth daily. 09/15/20  Yes [provider]    Social History   Socioeconomic History   Marital status: Married    Spouse name: Not on file   Number of children: Not on file   Years of education: Not on file   Highest education level: Not on file  Occupational History   Not on file  Tobacco Use   Smoking status: Never   Smokeless tobacco: Never  Vaping Use   Vaping status: Never Used  Substance and Sexual Activity   Alcohol use: Yes    Comment: occ.   Drug use: No   Sexual activity: Not on file  Other Topics Concern   Not on file  Social History Narrative   Not on file   Social Determinants of Health   Financial Resource Strain: Low Risk  (11/09/2022)   Received from [facility], [facility]   Overall Financial Resource Strain (CARDIA)    Difficulty of Paying Living Expenses: Not hard at all  Food Insecurity: No Food Insecurity (11/09/2022)   Received from [facility], [facility]   Hunger Vital Sign    Worried About Running Out of Food in the Last Year: Never true    Ran Out of Food in the Last Year: Never true  Transportation Needs: No Transportation Needs (11/09/2022)   Received from [facility], [facility]   [facility] - Transportation    In the past 12 months, has lack of transportation kept you from medical appointments or from getting medications?: No    Lack of Transportation (Non-Medical): No  Physical Activity: Not on file  Stress: Not on file  Social Connections: Not on file  Intimate Partner Violence: Not on file     Family History  Problem Relation Age of Onset   Stroke Mother    Stroke Father    Alcohol abuse Father    Diabetes Brother    Alcohol abuse Brother    Stroke Maternal Grandfather     ROS: Otherwise negative unless mentioned in HPI  Physical  Examination  Vitals:   06/13/23 0444 06/13/23 0746  BP:  (!) 155/86  Pulse:  69  Resp:  20  Temp: 98.2 F (36.8 C) 98.2 F (36.8 C)  SpO2:  99%   Body mass index is 34.16 kg/m.  General:  WDWN in NAD Gait: Not observed HENT: WNL, normocephalic Pulmonary: normal non-labored breathing, without Rales, rhonchi,  wheezing Cardiac: regular, without  Murmurs, rubs or gallops; without carotid bruits Abdomen: Positive bowel sounds,  soft, NT/ND, no masses Skin: without rashes Vascular Exam/Pulses: Unable to palpate lower extremity pulses due to +2 edema.  Extremities: without ischemic changes, without Gangrene , without cellulitis; without open wounds;  Musculoskeletal: no muscle wasting or atrophy  Neurologic: A&O X 3;  No focal weakness or paresthesias are detected; speech is fluent/normal Psychiatric:  The pt has Normal affect. Lymph:  Unremarkable  CBC    Component Value Date/Time   WBC 12.0 (H) 06/13/2023 0442   RBC 3.34 (L) 06/13/2023 0442   HGB 10.5 (L) 06/13/2023 0442   HGB 13.2 02/03/2019 1015   HCT 31.0 (L) 06/13/2023 0442   HCT 38.7 02/03/2019 1015   PLT 148 (L) 06/13/2023 0442   PLT 202 02/03/2019 1015   MCV 92.8 06/13/2023 0442   MCV 92 02/03/2019 1015   MCV 97 08/13/2014 1955   MCH 31.4 06/13/2023 0442   MCHC 33.9 06/13/2023 0442   RDW 12.2 06/13/2023 0442   RDW 13.4 02/03/2019 1015   RDW 13.8 08/13/2014 1955   LYMPHSABS 1.4 06/12/2023 1414   LYMPHSABS 1.5 02/03/2019 1015   LYMPHSABS 2.2 08/13/2014 1955   MONOABS 1.3 (H) 06/12/2023 1414   MONOABS 0.6 08/13/2014 1955   EOSABS 0.2 06/12/2023 1414   EOSABS 0.4 02/03/2019 1015   EOSABS 0.3 08/13/2014 1955   BASOSABS 0.1 06/12/2023 1414   BASOSABS 0.0 02/03/2019 1015   BASOSABS 0.1 08/13/2014 1955    BMET    Component Value Date/Time   NA 135 06/13/2023 0442   NA 142 01/05/2022 1316   NA 141 08/14/2014 0418   K 4.8 06/13/2023 0442   K 3.6 08/14/2014 0418   CL 106 06/13/2023 0442   CL 106  08/14/2014 0418   CO2 23 06/13/2023 0442   CO2 29 08/14/2014 0418  GLUCOSE 134 (H) 06/13/2023 0442   GLUCOSE 106 (H) 08/14/2014 0418   BUN 84 (H) 06/13/2023 0442   BUN 27 01/05/2022 1316   BUN 30 (H) 08/14/2014 0418   CREATININE 4.53 (H) 06/13/2023 0442   CREATININE 1.18 08/14/2014 1227   CALCIUM 8.8 (L) 06/13/2023 0442   CALCIUM 8.3 (L) 08/14/2014 0418   GFRNONAA 12 (L) 06/13/2023 0442   GFRNONAA >60 08/14/2014 1227   GFRAA 47 (L) 03/01/2020 0859   GFRAA >60 08/14/2014 1227    COAGS: Lab Results  Component Value Date   INR 1.2 06/12/2023   INR 2.2 (H) 03/03/2020   INR 1.6 (H) 03/01/2020     Non-Invasive Vascular Imaging:   EXAM:06/12/23 NUCLEAR MEDICINE PERFUSION LUNG SCAN   Two-view chest x-ray   TECHNIQUE: Perfusion images were obtained in multiple projections after intravenous injection of radiopharmaceutical.   Ventilation scans intentionally deferred if perfusion scan and chest x-ray adequate for interpretation during COVID 19 epidemic.   Standard two-view sitting chest x-ray obtained.   RADIOPHARMACEUTICALS:  4.31 mCi Tc-36m MAA IV   COMPARISON:  None Available.   FINDINGS: Chest x-ray demonstrates status post median sternotomy. Left upper chest pacemaker with a additional leads. No consolidation, pneumothorax or effusion. No edema.   On perfusion there are several large defects identified including left upper lobe, bilateral lower lobes peripherally.   IMPRESSION: High probability perfusion scan.   Critical Value/emergent results were called by telephone at the time of interpretation on 06/12/2023 at 1:59 pm to provider NEHA RAY ; Donna Bernard , who verbally acknowledged these results.  Statin:  Yes.   Beta Blocker:  Yes.   Aspirin:  Yes.   ACEI:  No. ARB:  Yes.   CCB use:  No Other antiplatelets/anticoagulants:  No.    ASSESSMENT/PLAN: This is a 82 y.o. male who presents to [facility] with right lower extremity pain and swelling X 1 week. Known  to have  DVT. Patient is poor historian. Work up shows he also has a Pulmonary Embolism. Plan is to do a right lower extremity thrombectomy rather than a pulmonary thrombectomy due to IV Contrast load. Patient currently appears to have Stage 3 CKD which he is unaware of. Last BUN = 84 Creat 4.53  PLAN: Vascular Surgery plans on taking the patient to the vascular lab later today for a right lower extremity thrombectomy. I discussed in detail with the patient the procedure, benefits, risks and complications. He verbalized his understanding and would like to proceed. I answered all of his questions. Patient has been NPO since midnight.     -I discussed the plan in detail with Dr [name] MD and he is agreement with the plan.    Marcie Bal [facility] 06/13/2023 7:46 AM

## 2023-06-13 NOTE — Progress Notes (Addendum)
[facility]  ROUNDING NOTE   Subjective:   Sean [name]. Is a 82 y.o. male with past medical history of CHF, diabetes, CAD s/p CABG, hypertension and chornic kidney disease stage IIIb. Patient presents to the ED at the advise of nephrologist due to right leg edema. Patient has been admitted for Acute pulmonary embolism (HCC) [I26.99] DVT, lower extremity, proximal, acute, right (HCC) [I82.4Y1]  Patient is known to our practice and is followed by Dr Suezanne Jacquet outpatient. Patient was seen for routine office visit yesterday and mentioned the edema. Patient was seen today in the emergency department. Wife and daughter are at bedside. They state he fell a couple days ago, but right leg edema was edematous prior to fall. Wife states it was noticed at least a week prior. Appetite has remained the same, denies nausea or vomiting.   Labs on ED arrival significant for sodium 134, glucose 133, BUN 82, creatinine 4.75 with GFR 12, and hemoglobin 11.9.  INR 1.2.  Pulmonary perfusion study shows numerous large defects in left upper lobe and bilateral lower lobes.  Right lower Doppler shows right DVT extending into the calf.  Currently on heparin drip  We have been consulted to monitor acute kidney injury.   Objective:  Vital signs in last 24 hours:  Temp:  [98 F (36.7 C)-98.6 F (37 C)] 98.3 F (36.8 C) (07/24 1351) Pulse Rate:  [67-85] 83 (07/24 1351) Resp:  [17-29] 20 (07/24 1351) BP: (135-170)/(74-108) 140/108 (07/24 1351) SpO2:  [94 %-100 %] 100 % (07/24 1351)  Weight change:  Filed Weights   06/12/23 1336  Weight: 108 kg    Intake/Output: I/O last 3 completed shifts: In: 270.1 [I.V.:270.1] Out: 2700 [Urine:2700]   Intake/Output this shift:  Total I/O In: -  Out: 1450 [Urine:1450]  Physical Exam: General: NAD  Head: Normocephalic, atraumatic. Moist oral mucosal membranes  Eyes: Anicteric  Lungs:  Clear to auscultation  Heart: Regular rate and rhythm  Abdomen:   Soft, nontender, distended  Extremities: R leg peripheral edema more significant than left.  Neurologic: Alert and oriented, moving all four extremities  Skin: No lesions  Access: None    Basic Metabolic Panel: Recent Labs  Lab 06/12/23 1414 06/13/23 0442  NA 134* 135  K 5.1 4.8  CL 102 106  CO2 22 23  GLUCOSE 133* 134*  BUN 82* 84*  CREATININE 4.75* 4.53*  CALCIUM 9.6 8.8*    Liver Function Tests: Recent Labs  Lab 06/12/23 1414  AST 13*  ALT 9  ALKPHOS 44  BILITOT 1.4*  PROT 7.6  ALBUMIN 4.5   No results for input(s): "LIPASE", "AMYLASE" in the last 168 hours. No results for input(s): "AMMONIA" in the last 168 hours.  CBC: Recent Labs  Lab 06/12/23 1414 06/13/23 0442  WBC 14.3* 12.0*  NEUTROABS 11.2*  --   HGB 11.9* 10.5*  HCT 36.7* 31.0*  MCV 95.6 92.8  PLT 148* 148*    Cardiac Enzymes: Recent Labs  Lab 06/13/23 0442  CKTOTAL 43*    BNP: Invalid input(s): "POCBNP"  CBG: No results for input(s): "GLUCAP" in the last 168 hours.  Microbiology: Results for orders placed or performed during the hospital encounter of 01/12/21  SARS CORONAVIRUS 2 (TAT 6-24 HRS) Nasopharyngeal Nasopharyngeal Swab     Status: None   Collection Time: 01/12/21 12:14 PM   Specimen: Nasopharyngeal Swab  Result Value Ref Range Status   SARS Coronavirus 2 NEGATIVE NEGATIVE Final    Comment: (NOTE) SARS-CoV-2  target nucleic acids are NOT DETECTED.  The SARS-CoV-2 RNA is generally detectable in upper and lower respiratory specimens during the acute phase of infection. Negative results do not preclude SARS-CoV-2 infection, do not rule out co-infections with other pathogens, and should not be used as the sole basis for treatment or other patient management decisions. Negative results must be combined with clinical observations, patient history, and epidemiological information. The expected result is Negative.  Fact Sheet for  Patients: HairSlick.no  Fact Sheet for Healthcare Providers: quierodirigir.com  This test is not yet approved or cleared by the [place] FDA and  has been authorized for detection and/or diagnosis of SARS-CoV-2 by FDA under an Emergency Use Authorization (EUA). This EUA will remain  in effect (meaning this test can be used) for the duration of the COVID-19 declaration under Se ction 564(b)(1) of the Act, 21 U.S.C. section 360bbb-3(b)(1), unless the authorization is terminated or revoked sooner.  Performed at [facility], [address] [place], [place] [address]     Coagulation Studies: Recent Labs    06/12/23 1414  LABPROT 15.8*  INR 1.2    Urinalysis: No results for input(s): "COLORURINE", "LABSPEC", "PHURINE", "GLUCOSEU", "HGBUR", "BILIRUBINUR", "KETONESUR", "PROTEINUR", "UROBILINOGEN", "NITRITE", "LEUKOCYTESUR" in the last 72 hours.  Invalid input(s): "APPERANCEUR"    Imaging: NM Pulmonary Perfusion  Result Date: 06/12/2023 CLINICAL DATA:  Pulmonary embolism. EXAM: NUCLEAR MEDICINE PERFUSION LUNG SCAN Two-view chest x-ray TECHNIQUE: Perfusion images were obtained in multiple projections after intravenous injection of radiopharmaceutical. Ventilation scans intentionally deferred if perfusion scan and chest x-ray adequate for interpretation during COVID 19 epidemic. Standard two-view sitting chest x-ray obtained. RADIOPHARMACEUTICALS:  4.31 mCi Tc-80m MAA IV COMPARISON:  None Available. FINDINGS: Chest x-ray demonstrates status post median sternotomy. Left upper chest pacemaker with a additional leads. No consolidation, pneumothorax or effusion. No edema. On perfusion there are several large defects identified including left upper lobe, bilateral lower lobes peripherally. IMPRESSION: High probability perfusion scan. Critical Value/emergent results were called by telephone at the time of interpretation on 06/12/2023  at 1:59 pm to provider NEHA RAY ; Donna Bernard , who verbally acknowledged these results. Electronically Signed   By: Karen Kays M.D.   On: 06/12/2023 17:12   DG Chest 2 View  Result Date: 06/12/2023 CLINICAL DATA:  Pulmonary embolism. EXAM: NUCLEAR MEDICINE PERFUSION LUNG SCAN Two-view chest x-ray TECHNIQUE: Perfusion images were obtained in multiple projections after intravenous injection of radiopharmaceutical. Ventilation scans intentionally deferred if perfusion scan and chest x-ray adequate for interpretation during COVID 19 epidemic. Standard two-view sitting chest x-ray obtained. RADIOPHARMACEUTICALS:  4.31 mCi Tc-54m MAA IV COMPARISON:  None Available. FINDINGS: Chest x-ray demonstrates status post median sternotomy. Left upper chest pacemaker with a additional leads. No consolidation, pneumothorax or effusion. No edema. On perfusion there are several large defects identified including left upper lobe, bilateral lower lobes peripherally. IMPRESSION: High probability perfusion scan. Critical Value/emergent results were called by telephone at the time of interpretation on 06/12/2023 at 1:59 pm to provider NEHA RAY ; Donna Bernard , who verbally acknowledged these results. Electronically Signed   By: Karen Kays M.D.   On: 06/12/2023 17:12   US Venous Img Lower Unilateral Right (DVT)  Result Date: 06/12/2023 CLINICAL DATA:  Right lower extremity pain and swelling since last Saturday EXAM: RIGHT LOWER EXTREMITY VENOUS DOPPLER ULTRASOUND TECHNIQUE: Gray-scale sonography with graded compression, as well as color Doppler and duplex ultrasound were performed to evaluate the lower extremity deep venous systems from the level  of the common femoral vein and including the common femoral, femoral, profunda femoral, popliteal and calf veins including the posterior tibial, peroneal and gastrocnemius veins when visible. The superficial great saphenous vein was also interrogated. Spectral Doppler was utilized to  evaluate flow at rest and with distal augmentation maneuvers in the common femoral, femoral and popliteal veins. COMPARISON:  None Available. FINDINGS: Contralateral Common Femoral Vein: Respiratory phasicity is normal and symmetric with the symptomatic side. No evidence of thrombus. Normal compressibility. Common Femoral Vein: No evidence of thrombus. Normal compressibility, respiratory phasicity and response to augmentation. Saphenofemoral Junction: No evidence of thrombus. Normal compressibility and flow on color Doppler imaging. Profunda Femoral Vein: No evidence of thrombus. Normal compressibility and flow on color Doppler imaging. Femoral Vein: Mixed echogenicity intraluminal thrombus. Vessel noncompressible. Thrombus appears occlusive. Popliteal Vein: Similar intraluminal mixed echogenicity thrombus appearing nearly occlusive. Vessel partially compressible. Calf Veins: Thrombus does appear to extend into the tibial and peroneal calf veins to some degree with partial compressibility. Superficial Great Saphenous Vein: Proximal GSV superficial thrombosis noted without occlusion. Vessel partially compressible. IMPRESSION: Positive exam for right femoropopliteal DVT extending into the calf veins. Proximal right GSV superficial thrombosis as well. Electronically Signed   By: Judie Petit.  Shick M.D.   On: 06/12/2023 13:05     Medications:    sodium chloride 100 mL/hr at 06/13/23 1249   sodium chloride      ceFAZolin (ANCEF) IV Stopped (06/13/23 1254)   heparin 1,350 Units/hr (06/13/23 0455)    allopurinol  100 mg Oral QODAY   aspirin EC  81 mg Oral Daily   atorvastatin  40 mg Oral Daily   loratadine  10 mg Oral Daily   metoprolol succinate  25 mg Oral Daily   multivitamin with minerals  1 tablet Oral Daily   tamsulosin  0.4 mg Oral Daily   acetaminophen **OR** acetaminophen, diphenhydrAMINE, famotidine, fentaNYL (SUBLIMAZE) injection, HYDROmorphone (DILAUDID) injection, magnesium hydroxide,  methylPREDNISolone (SOLU-MEDROL) injection, midazolam, ondansetron (ZOFRAN) IV, ondansetron **OR** [DISCONTINUED] ondansetron (ZOFRAN) IV, traZODone  Assessment/ Plan:  Mr. Sean [name]. is a 82 y.o.  male with past medical history of CHF, diabetes, CAD s/p CABG, hypertension and chornic kidney disease stage IIIb. Patient presents to the ED at the advise of nephrologist due to right leg edema. Patient has been admitted for Acute pulmonary embolism (HCC) [I26.99] DVT, lower extremity, proximal, acute, right (HCC) [I82.4Y1]   Acute Kidney Injury on chronic kidney disease stage IIIb with baseline creatinine 1.8 and GFR of 38 on 12/13/22.  Acute kidney injury likely secondary to possible hypoperfusion from multiple thrombus Chronic kidney disease is secondary to diabetes and hypertension. Receiving IVF. Creatinine elevated on admission, 4.75. Will monitor renal function with treatment of underlying cause. No acute need for dialysis at this time.   Lab Results  Component Value Date   CREATININE 4.53 (H) 06/13/2023   CREATININE 4.75 (H) 06/12/2023   CREATININE 1.74 (H) 01/12/2023    Intake/Output Summary (Last 24 hours) at 06/13/2023 1406 Last data filed at 06/13/2023 1253 Gross per 24 hour  Intake 270.09 ml  Output 4150 ml  Net -3879.91 ml    2. Anemia of chronic kidney disease Lab Results  Component Value Date   HGB 10.5 (L) 06/13/2023    Hgb within optimal range.   3. Hypertension with chronic kidney disease. Home regimen includes losartan, metoprolol and spironolactone. Only receiving metoprolol.   4. Acute pulmonary embolism. Seen on chest xray and NM perfusion study. Placed on Heparin drip.  Vascular surgery consulted.   LOS: 1 Emanuel Dowson 7/24/20242:06 PM

## 2023-06-13 NOTE — TOC Benefit Eligibility Note (Signed)
Pharmacy Patient Advocate Encounter  Insurance verification completed.    The patient is insured through [employer]/ [employer]   Ran test claim for Eliquis 5 mg and the current 30 day co-pay is $47.00.   This test claim was processed through [facility]- copay amounts may vary at other pharmacies due to pharmacy/plan contracts, or as the patient moves through the different stages of their insurance plan.    Roland Earl, CPHT Pharmacy Patient Advocate Specialist [facility] Patient Advocate Team Direct Number: [phone]  Fax: [phone]

## 2023-06-13 NOTE — Progress Notes (Signed)
This patient is scheduled for a dvt thrombectomy with ivc filter placement today.  Patient ate oatmeal, bacon, french toast with butter and syrup, orange juice and coffee at 9am today.  Dr. Wyn Quaker made aware of the above and will delay procedure until 3pm today

## 2023-06-13 NOTE — Progress Notes (Signed)
MD called & aware of Creatinine level of 4.54. MD wishes to proceed with procedure now.

## 2023-06-13 NOTE — H&P (View-Only) (Signed)
Hospital Consult    Reason for Consult:  Right Lower Extremity DVT with PE Requesting Physician:  Dr Valente David MD  MRN #:  [id number]  History of Present Illness: This is a 82 y.o. male with medical history significant for coronary artery disease, asthma, osteoarthritis, chronic systolic CHF, stage III chronic kidney disease and DVT of the left upper extremity, history of complete heart block status post PPM and hypertension, who presented to the emergency room with acute onset of right leg pain and swelling for which the patient had an outpatient venous Doppler that showed right-sided extensive DVT. Upon further work up he underwent a VQ scan which showed high probability of pulmonary embolism. Vascular surgery consulted to evaluate.   On exam this morning the patient is resting comfortably on the stretcher. He denies any shortness of breath or chest pain. He does endorse right lower extremity edema with pain on ambulation. He is not sure when this all started. He remains on room air with oxygen saturations at 98-100%. Patient is not a good historian and wishes we speak with his wife when she arrives later this morning.   Past Medical History:  Diagnosis Date   Arthritis    Asthma    CAD (coronary artery disease)    a. LHC 7/19: ostLM 30%, ostLAD 30%, p-mLAD 99% mod calcified, ostD1 90%, ost-pLCx 80% ulcerative & mod calcified, p-mLCx 50%, OM2 50%, mod dz LPDA, RCA small w/ diffuse dz throughout; b. recommendation of CABG; c. 3-V CABG 08/26/18 (LIMA-LAD, VG-Diag, VG-LCx)   Chronic systolic CHF (congestive heart failure) (HCC)    a. 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 DVT of left subclavian dx'd 06/17/2018; b. Eliquis   GERD (gastroesophageal reflux disease)     takes otc occasionally   History of complete heart block    a. s/p MDT PPM 05/2017   Hypertension    Obesity    Presence of permanent cardiac pacemaker    Dr. Rosette Reveal implanted 05/2017   PVC's (premature ventricular contractions)    a. PPM-induced    Past Surgical History:  Procedure Laterality Date   A-FLUTTER ABLATION N/A 03/03/2020   Procedure: A-FLUTTER ABLATION;  Surgeon: Duke Salvia, MD;  Location: [facility] INVASIVE CV LAB;  Service: Cardiovascular;  Laterality: N/A;   BACK SURGERY     CORONARY ARTERY BYPASS GRAFT N/A 08/26/2018   Procedure: CORONARY ARTERY BYPASS GRAFTING (CABG) x3 , Using left internal mammory artery and right leg greater saphronious vein. Harvested endoscopically.;  Surgeon: Kerin Perna, MD;  Location: [facility] OR;  Service: Open Heart Surgery;  Laterality: N/A;   EPICARDIAL PACING LEAD PLACEMENT N/A 08/26/2018   Procedure: LV PACING LEAD PLACEMENT;  Surgeon: Kerin Perna, MD;  Location: [facility] OR;  Service: Thoracic;  Laterality: N/A;   EPICARDIAL PACING LEAD PLACEMENT Left 11/07/2018   Procedure: REVISE PACEMAKER LEAD LEFT CHEST;  Surgeon: Kerin Perna, MD;  Location: [facility] OR;  Service: Thoracic;  Laterality: Left;   INSERT / REPLACE / REMOVE PACEMAKER     [employer]   LEFT HEART CATH AND CORONARY ANGIOGRAPHY N/A 06/13/2018   Procedure: LEFT HEART CATH AND CORONARY ANGIOGRAPHY;  Surgeon: Iran Ouch, MD;  Location: [facility] INVASIVE CV LAB;  Service: Cardiovascular;  Laterality: N/A;   LEG SURGERY     PACEMAKER IMPLANT N/A 06/11/2017   Procedure: Pacemaker Implant;  Surgeon: Marinus Maw, MD;  Location: [facility] INVASIVE CV LAB;  Service: Cardiovascular;  Laterality: N/A;   PLEURAL EFFUSION DRAINAGE Left 09/24/2018   Procedure: DRAINAGE OF LOCULATED PLEURAL EFFUSION;  Surgeon: Kerin Perna, MD;  Location: [facility] OR;  Service: Thoracic;  Laterality: Left;   TEE WITHOUT CARDIOVERSION N/A 08/26/2018   Procedure: TRANSESOPHAGEAL ECHOCARDIOGRAM (TEE);  Surgeon: Donata Clay, Theron Arista, MD;  Location: [facility] OR;  Service: Open Heart Surgery;  Laterality: N/A;   TEE WITHOUT CARDIOVERSION  03/03/2020   Procedure: Transesophageal Echocardiogram (Tee);  Surgeon: Duke Salvia, MD;  Location: [facility] INVASIVE CV LAB;  Service: Cardiovascular;;   VIDEO ASSISTED THORACOSCOPY Left 09/24/2018   Procedure: VIDEO ASSISTED THORACOSCOPY;  Surgeon: Kerin Perna, MD;  Location: [facility] OR;  Service: Thoracic;  Laterality: Left;    Allergies  Allergen Reactions   Azithromycin Hives   Erythromycin Hives    Prior to Admission medications   Medication Sig Start Date End Date Taking? Authorizing Provider  acetaminophen (TYLENOL) 500 MG tablet Take 500 mg by mouth every 6 (six) hours as needed for moderate pain or headache.   Yes [provider]  allopurinol (ZYLOPRIM) 100 MG tablet Take 100 mg by mouth every other day. At night   Yes [provider]  aspirin EC 81 MG tablet Take 1 tablet (81 mg total) by mouth daily. Swallow whole. 10/10/22  Yes Iran Ouch, MD  atorvastatin (LIPITOR) 40 MG tablet TAKE ONE TABLET EVERY DAY 01/01/23  Yes Duke Salvia, MD  EPINEPHrine (PRIMATENE MIST) 0.125 MG/ACT AERO Inhale 1 puff into the lungs daily as needed (shortness of breath).   Yes [provider]  loratadine (CLARITIN) 10 MG tablet Take 1 tablet (10 mg total) by mouth daily. 05/04/23  Yes Hammock, Lavonna Rua, NP  losartan (COZAAR) 25 MG tablet TAKE ONE TABLET (25 MG) BY MOUTH EVERY DAY 03/09/23  Yes Duke Salvia, MD  metoprolol succinate (TOPROL-XL) 25 MG 24 hr tablet TAKE 1 TABLET BY MOUTH DAILY 04/10/23  Yes Iran Ouch, MD  Multiple Vitamin (MULTIVITAMIN WITH MINERALS) TABS tablet Take 1 tablet by mouth daily.   Yes [provider]  Specialty Vitamins Products (PROSTATE PO) Take 1 tablet by mouth at bedtime.    Yes [provider]  spironolactone (ALDACTONE) 25 MG tablet Take 0.5 tablet (12.5 mg) by mouth once daily 12/21/22  Yes Duke Salvia, MD  tamsulosin (FLOMAX) 0.4 MG CAPS capsule Take 0.4 mg by mouth daily. 09/15/20  Yes [provider]    Social History   Socioeconomic History   Marital status: Married    Spouse name: Not on file   Number of children: Not on file   Years of education: Not on file   Highest education level: Not on file  Occupational History   Not on file  Tobacco Use   Smoking status: Never   Smokeless tobacco: Never  Vaping Use   Vaping status: Never Used  Substance and Sexual Activity   Alcohol use: Yes    Comment: occ.   Drug use: No   Sexual activity: Not on file  Other Topics Concern   Not on file  Social History Narrative   Not on file   Social Determinants of Health   Financial Resource Strain: Low Risk  (11/09/2022)   Received from [facility], [facility]   Overall Financial Resource Strain (CARDIA)    Difficulty of Paying Living Expenses: Not hard at all  Food Insecurity: No Food Insecurity (11/09/2022)   Received from [facility], [facility]   Hunger Vital Sign    Worried About Running Out of Food in the Last Year: Never true    Ran Out of Food in the Last Year: Never true  Transportation Needs: No Transportation Needs (11/09/2022)   Received from [facility], [facility]   [facility] - Transportation    In the past 12 months, has lack of transportation kept you from medical appointments or from getting medications?: No    Lack of Transportation (Non-Medical): No  Physical Activity: Not on file  Stress: Not on file  Social Connections: Not on file  Intimate Partner Violence: Not on file     Family History  Problem Relation Age of Onset   Stroke Mother    Stroke Father    Alcohol abuse Father    Diabetes Brother    Alcohol abuse Brother    Stroke Maternal Grandfather     ROS: Otherwise negative unless mentioned in HPI  Physical  Examination  Vitals:   06/13/23 0444 06/13/23 0746  BP:  (!) 155/86  Pulse:  69  Resp:  20  Temp: 98.2 F (36.8 C) 98.2 F (36.8 C)  SpO2:  99%   Body mass index is 34.16 kg/m.  General:  WDWN in NAD Gait: Not observed HENT: WNL, normocephalic Pulmonary: normal non-labored breathing, without Rales, rhonchi,  wheezing Cardiac: regular, without  Murmurs, rubs or gallops; without carotid bruits Abdomen: Positive bowel sounds,  soft, NT/ND, no masses Skin: without rashes Vascular Exam/Pulses: Unable to palpate lower extremity pulses due to +2 edema.  Extremities: without ischemic changes, without Gangrene , without cellulitis; without open wounds;  Musculoskeletal: no muscle wasting or atrophy  Neurologic: A&O X 3;  No focal weakness or paresthesias are detected; speech is fluent/normal Psychiatric:  The pt has Normal affect. Lymph:  Unremarkable  CBC    Component Value Date/Time   WBC 12.0 (H) 06/13/2023 0442   RBC 3.34 (L) 06/13/2023 0442   HGB 10.5 (L) 06/13/2023 0442   HGB 13.2 02/03/2019 1015   HCT 31.0 (L) 06/13/2023 0442   HCT 38.7 02/03/2019 1015   PLT 148 (L) 06/13/2023 0442   PLT 202 02/03/2019 1015   MCV 92.8 06/13/2023 0442   MCV 92 02/03/2019 1015   MCV 97 08/13/2014 1955   MCH 31.4 06/13/2023 0442   MCHC 33.9 06/13/2023 0442   RDW 12.2 06/13/2023 0442   RDW 13.4 02/03/2019 1015   RDW 13.8 08/13/2014 1955   LYMPHSABS 1.4 06/12/2023 1414   LYMPHSABS 1.5 02/03/2019 1015   LYMPHSABS 2.2 08/13/2014 1955   MONOABS 1.3 (H) 06/12/2023 1414   MONOABS 0.6 08/13/2014 1955   EOSABS 0.2 06/12/2023 1414   EOSABS 0.4 02/03/2019 1015   EOSABS 0.3 08/13/2014 1955   BASOSABS 0.1 06/12/2023 1414   BASOSABS 0.0 02/03/2019 1015   BASOSABS 0.1 08/13/2014 1955    BMET    Component Value Date/Time   NA 135 06/13/2023 0442   NA 142 01/05/2022 1316   NA 141 08/14/2014 0418   K 4.8 06/13/2023 0442   K 3.6 08/14/2014 0418   CL 106 06/13/2023 0442   CL 106  08/14/2014 0418   CO2 23 06/13/2023 0442   CO2 29 08/14/2014 0418  GLUCOSE 134 (H) 06/13/2023 0442   GLUCOSE 106 (H) 08/14/2014 0418   BUN 84 (H) 06/13/2023 0442   BUN 27 01/05/2022 1316   BUN 30 (H) 08/14/2014 0418   CREATININE 4.53 (H) 06/13/2023 0442   CREATININE 1.18 08/14/2014 1227   CALCIUM 8.8 (L) 06/13/2023 0442   CALCIUM 8.3 (L) 08/14/2014 0418   GFRNONAA 12 (L) 06/13/2023 0442   GFRNONAA >60 08/14/2014 1227   GFRAA 47 (L) 03/01/2020 0859   GFRAA >60 08/14/2014 1227    COAGS: Lab Results  Component Value Date   INR 1.2 06/12/2023   INR 2.2 (H) 03/03/2020   INR 1.6 (H) 03/01/2020     Non-Invasive Vascular Imaging:   EXAM:06/12/23 NUCLEAR MEDICINE PERFUSION LUNG SCAN   Two-view chest x-ray   TECHNIQUE: Perfusion images were obtained in multiple projections after intravenous injection of radiopharmaceutical.   Ventilation scans intentionally deferred if perfusion scan and chest x-ray adequate for interpretation during COVID 19 epidemic.   Standard two-view sitting chest x-ray obtained.   RADIOPHARMACEUTICALS:  4.31 mCi Tc-36m MAA IV   COMPARISON:  None Available.   FINDINGS: Chest x-ray demonstrates status post median sternotomy. Left upper chest pacemaker with a additional leads. No consolidation, pneumothorax or effusion. No edema.   On perfusion there are several large defects identified including left upper lobe, bilateral lower lobes peripherally.   IMPRESSION: High probability perfusion scan.   Critical Value/emergent results were called by telephone at the time of interpretation on 06/12/2023 at 1:59 pm to provider NEHA RAY ; Donna Bernard , who verbally acknowledged these results.  Statin:  Yes.   Beta Blocker:  Yes.   Aspirin:  Yes.   ACEI:  No. ARB:  Yes.   CCB use:  No Other antiplatelets/anticoagulants:  No.    ASSESSMENT/PLAN: This is a 82 y.o. male who presents to [facility] with right lower extremity pain and swelling X 1 week. Known  to have  DVT. Patient is poor historian. Work up shows he also has a Pulmonary Embolism. Plan is to do a right lower extremity thrombectomy rather than a pulmonary thrombectomy due to IV Contrast load. Patient currently appears to have Stage 3 CKD which he is unaware of. Last BUN = 84 Creat 4.53  PLAN: Vascular Surgery plans on taking the patient to the vascular lab later today for a right lower extremity thrombectomy. I discussed in detail with the patient the procedure, benefits, risks and complications. He verbalized his understanding and would like to proceed. I answered all of his questions. Patient has been NPO since midnight.     -I discussed the plan in detail with Dr [name] MD and he is agreement with the plan.    Marcie Bal [facility] 06/13/2023 7:46 AM

## 2023-06-13 NOTE — ED Notes (Signed)
RN at bedside speaking with pt for approx 30 mins.

## 2023-06-13 NOTE — Interval H&P Note (Signed)
History and Physical Interval Note:  06/13/2023 3:17 PM  Sean [name].  has presented today for surgery, with the diagnosis of PVD.  The various methods of treatment have been discussed with the patient and family. After consideration of risks, benefits and other options for treatment, the patient has consented to  Procedure(s): PERIPHERAL VASCULAR THROMBECTOMY (Right) as a surgical intervention.  The patient's history has been reviewed, patient examined, no change in status, stable for surgery.  I have reviewed the patient's chart and labs.  Questions were answered to the patient's satisfaction.     [name]

## 2023-06-13 NOTE — Consult Note (Signed)
ANTICOAGULATION CONSULT NOTE   Pharmacy Consult for Heparin Infusion Indication: DVT  Allergies  Allergen Reactions   Azithromycin Hives   Erythromycin Hives    Patient Measurements: Height: 5\' 10"  (177.8 cm) Weight: 108 kg (238 lb 1.6 oz) IBW/kg (Calculated) : 73 Heparin Dosing Weight: 96.3 kg  Vital Signs: Temp: 98.6 F (37 C) (07/23 2144) Temp Source: Oral (07/23 2144) BP: 155/80 (07/23 2300) Pulse Rate: 70 (07/23 2300)  Labs: Recent Labs    06/12/23 1414 06/12/23 1828 06/12/23 2335  HGB 11.9*  --   --   HCT 36.7*  --   --   PLT 148*  --   --   APTT 35  --   --   LABPROT 15.8*  --   --   INR 1.2  --   --   HEPARINUNFRC  --   --  0.88*  CREATININE 4.75*  --   --   TROPONINIHS 3 3  --     Estimated Creatinine Clearance: 15 mL/min (A) (by C-G formula based on SCr of 4.75 mg/dL (H)).   Medical History: Past Medical History:  Diagnosis Date   Arthritis    Asthma    CAD (coronary artery disease)    a. LHC 7/19: ostLM 30%, ostLAD 30%, p-mLAD 99% mod calcified, ostD1 90%, ost-pLCx 80% ulcerative & mod calcified, p-mLCx 50%, OM2 50%, mod dz LPDA, RCA small w/ diffuse dz throughout; b. recommendation of CABG; c. 3-V CABG 08/26/18 (LIMA-LAD, VG-Diag, VG-LCx)   Chronic systolic CHF (congestive heart failure) (HCC)    a. 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 DVT of left subclavian dx'd 06/17/2018; b. Eliquis   GERD (gastroesophageal reflux disease)    takes otc occasionally   History of complete heart block    a. s/p MDT PPM 05/2017   Hypertension    Obesity    Presence of permanent cardiac pacemaker    Dr. Rosette Reveal implanted 05/2017   PVC's (premature ventricular contractions)    a. PPM-induced    Assessment: Sean [name].  is a 82 y.o. male presenting with DVT. PMH significant for CKD, CAD, CHF, gout, iCMP, AF. Patient was not on [facility] PTA per chart review. [place] positive for right femoropopliteal DVT extending into the calf veins. Pharmacy has been consulted to initiate and manage heparin infusion.   Baseline Labs: aPTT 35, PT 15.8, INR 1.2, Hgb 11.9, Hct 36.7, Plt 148   Goal of Therapy:  Heparin level 0.3-0.7 units/ml Monitor platelets by anticoagulation protocol: Yes  07/23 2335 HL 0.88, supratherapeutic  Plan:  Decrease heparin infusion to 1350 units/hr Recheck HL in 8 hours after rate change  Continue to monitor H&H and platelets daily while on heparin infusion   Otelia Sergeant, PharmD, [facility] 06/13/2023 12:34 AM

## 2023-06-13 NOTE — Progress Notes (Signed)
PROGRESS NOTE    Sean [name].  UXL:[id number] DOB: December 16, 1940 DOA: 06/12/2023 PCP: Jerl Mina, MD   Assessment & Plan:   Principal Problem:   Acute pulmonary embolism (HCC) Active Problems:   DVT, lower extremity, proximal, acute, right (HCC)   Acute kidney injury superimposed on chronic kidney disease (HCC)   Essential hypertension   Dyslipidemia   BPH (benign prostatic hyperplasia)   Gout  Assessment and Plan: Acute pulmonary embolism: continue on IV heparin drip. Echo ordered to assess for RV strain.   Right LE DVT: extensive. S/p mechanical thrombectomy & IVC filter placement 06/13/23 as per vasc surg   AKI on CKDIIIb: Cr is trending slightly today. Holding aldactone, losartan. Avoid nephrotoxic meds   HTN: continue on metoprolol. Holding losartan, aldactone   HLD: continue on statin   BPH: continue on flomax  Gout: continue on allopurinol       DVT prophylaxis: IV heparin  Code Status: DNR Family Communication:  Disposition Plan: depends on PT/OT recs  Status is: Inpatient Remains inpatient appropriate because: on IV heparin     Level of care: Progressive Consultants:  Vasc surg   Procedures:   Antimicrobials:    Subjective: Pt c/o malaise   Objective: Vitals:   06/12/23 2300 06/13/23 0100 06/13/23 0442 06/13/23 0444  BP: (!) 155/80 (!) 158/82 (!) 156/86   Pulse: 70 74 72   Resp: (!) 29 19 17    Temp:  98.2 F (36.8 C)  98.2 F (36.8 C)  TempSrc:  Oral  Oral  SpO2: 97% 99% 97%   Weight:      Height:        Intake/Output Summary (Last 24 hours) at 06/13/2023 0736 Last data filed at 06/13/2023 0455 Gross per 24 hour  Intake 270.09 ml  Output 2700 ml  Net -2429.91 ml   Filed Weights   06/12/23 1336  Weight: 108 kg    Examination:  General exam: Appears calm and comfortable  Respiratory system: Clear to auscultation. Respiratory effort normal. Cardiovascular system: S1 & S2+. No rubs, gallops or clicks.  Gastrointestinal  system: Abdomen is nondistended, soft and nontender. Normal bowel sounds heard. Central nervous system: Alert and awake. Moves all extremities  Psychiatry: Judgement and insight appears at baseline . Mood & affect appropriate.     Data Reviewed: I have personally reviewed following labs and imaging studies  CBC: Recent Labs  Lab 06/12/23 1414 06/13/23 0442  WBC 14.3* 12.0*  NEUTROABS 11.2*  --   HGB 11.9* 10.5*  HCT 36.7* 31.0*  MCV 95.6 92.8  PLT 148* 148*   Basic Metabolic Panel: Recent Labs  Lab 06/12/23 1414 06/13/23 0442  NA 134* 135  K 5.1 4.8  CL 102 106  CO2 22 23  GLUCOSE 133* 134*  BUN 82* 84*  CREATININE 4.75* 4.53*  CALCIUM 9.6 8.8*   GFR: Estimated Creatinine Clearance: 15.7 mL/min (A) (by C-G formula based on SCr of 4.53 mg/dL (H)). Liver Function Tests: Recent Labs  Lab 06/12/23 1414  AST 13*  ALT 9  ALKPHOS 44  BILITOT 1.4*  PROT 7.6  ALBUMIN 4.5   No results for input(s): "LIPASE", "AMYLASE" in the last 168 hours. No results for input(s): "AMMONIA" in the last 168 hours. Coagulation Profile: Recent Labs  Lab 06/12/23 1414  INR 1.2   Cardiac Enzymes: No results for input(s): "CKTOTAL", "CKMB", "CKMBINDEX", "TROPONINI" in the last 168 hours. BNP (last 3 results) No results for input(s): "PROBNP" in the last 8760 hours.  HbA1C: No results for input(s): "HGBA1C" in the last 72 hours. CBG: No results for input(s): "GLUCAP" in the last 168 hours. Lipid Profile: No results for input(s): "CHOL", "HDL", "LDLCALC", "TRIG", "CHOLHDL", "LDLDIRECT" in the last 72 hours. Thyroid Function Tests: No results for input(s): "TSH", "T4TOTAL", "FREET4", "T3FREE", "THYROIDAB" in the last 72 hours. Anemia Panel: No results for input(s): "VITAMINB12", "FOLATE", "FERRITIN", "TIBC", "IRON", "RETICCTPCT" in the last 72 hours. Sepsis Labs: No results for input(s): "PROCALCITON", "LATICACIDVEN" in the last 168 hours.  No results found for this or any  previous visit (from the past 240 hour(s)).       Radiology Studies: NM Pulmonary Perfusion  Result Date: 06/12/2023 CLINICAL DATA:  Pulmonary embolism. EXAM: NUCLEAR MEDICINE PERFUSION LUNG SCAN Two-view chest x-ray TECHNIQUE: Perfusion images were obtained in multiple projections after intravenous injection of radiopharmaceutical. Ventilation scans intentionally deferred if perfusion scan and chest x-ray adequate for interpretation during COVID 19 epidemic. Standard two-view sitting chest x-ray obtained. RADIOPHARMACEUTICALS:  4.31 mCi Tc-45m MAA IV COMPARISON:  None Available. FINDINGS: Chest x-ray demonstrates status post median sternotomy. Left upper chest pacemaker with a additional leads. No consolidation, pneumothorax or effusion. No edema. On perfusion there are several large defects identified including left upper lobe, bilateral lower lobes peripherally. IMPRESSION: High probability perfusion scan. Critical Value/emergent results were called by telephone at the time of interpretation on 06/12/2023 at 1:59 pm to provider NEHA RAY ; Donna Bernard , who verbally acknowledged these results. Electronically Signed   By: Karen Kays M.D.   On: 06/12/2023 17:12   DG Chest 2 View  Result Date: 06/12/2023 CLINICAL DATA:  Pulmonary embolism. EXAM: NUCLEAR MEDICINE PERFUSION LUNG SCAN Two-view chest x-ray TECHNIQUE: Perfusion images were obtained in multiple projections after intravenous injection of radiopharmaceutical. Ventilation scans intentionally deferred if perfusion scan and chest x-ray adequate for interpretation during COVID 19 epidemic. Standard two-view sitting chest x-ray obtained. RADIOPHARMACEUTICALS:  4.31 mCi Tc-11m MAA IV COMPARISON:  None Available. FINDINGS: Chest x-ray demonstrates status post median sternotomy. Left upper chest pacemaker with a additional leads. No consolidation, pneumothorax or effusion. No edema. On perfusion there are several large defects identified including  left upper lobe, bilateral lower lobes peripherally. IMPRESSION: High probability perfusion scan. Critical Value/emergent results were called by telephone at the time of interpretation on 06/12/2023 at 1:59 pm to provider NEHA RAY ; Donna Bernard , who verbally acknowledged these results. Electronically Signed   By: Karen Kays M.D.   On: 06/12/2023 17:12   US Venous Img Lower Unilateral Right (DVT)  Result Date: 06/12/2023 CLINICAL DATA:  Right lower extremity pain and swelling since last Saturday EXAM: RIGHT LOWER EXTREMITY VENOUS DOPPLER ULTRASOUND TECHNIQUE: Gray-scale sonography with graded compression, as well as color Doppler and duplex ultrasound were performed to evaluate the lower extremity deep venous systems from the level of the common femoral vein and including the common femoral, femoral, profunda femoral, popliteal and calf veins including the posterior tibial, peroneal and gastrocnemius veins when visible. The superficial great saphenous vein was also interrogated. Spectral Doppler was utilized to evaluate flow at rest and with distal augmentation maneuvers in the common femoral, femoral and popliteal veins. COMPARISON:  None Available. FINDINGS: Contralateral Common Femoral Vein: Respiratory phasicity is normal and symmetric with the symptomatic side. No evidence of thrombus. Normal compressibility. Common Femoral Vein: No evidence of thrombus. Normal compressibility, respiratory phasicity and response to augmentation. Saphenofemoral Junction: No evidence of thrombus. Normal compressibility and flow on color Doppler imaging. Profunda Femoral Vein: No  evidence of thrombus. Normal compressibility and flow on color Doppler imaging. Femoral Vein: Mixed echogenicity intraluminal thrombus. Vessel noncompressible. Thrombus appears occlusive. Popliteal Vein: Similar intraluminal mixed echogenicity thrombus appearing nearly occlusive. Vessel partially compressible. Calf Veins: Thrombus does appear to  extend into the tibial and peroneal calf veins to some degree with partial compressibility. Superficial Great Saphenous Vein: Proximal GSV superficial thrombosis noted without occlusion. Vessel partially compressible. IMPRESSION: Positive exam for right femoropopliteal DVT extending into the calf veins. Proximal right GSV superficial thrombosis as well. Electronically Signed   By: Judie Petit.  Shick M.D.   On: 06/12/2023 13:05        Scheduled Meds:  allopurinol  100 mg Oral QODAY   aspirin EC  81 mg Oral Daily   atorvastatin  40 mg Oral Daily   loratadine  10 mg Oral Daily   metoprolol succinate  25 mg Oral Daily   multivitamin with minerals  1 tablet Oral Daily   tamsulosin  0.4 mg Oral Daily   Continuous Infusions:  sodium chloride 100 mL/hr at 06/13/23 0105   heparin 1,350 Units/hr (06/13/23 0455)     LOS: 1 day    Time spent: 35 mins     Charise Killian, MD [facility] Pager [phone]  If 7PM-7AM, please contact night-coverage [email or web address] 06/13/2023, 7:36 AM

## 2023-06-13 NOTE — Op Note (Signed)
[facility]   OPERATIVE NOTE   PRE-OPERATIVE DIAGNOSIS: extensive RLE DVT  POST-OPERATIVE DIAGNOSIS: same   PROCEDURE: 1.   US guidance for vascular access to right popliteal vein 2.   Catheter placement into right iliac vein from right popliteal approach 3.   IVC gram and right lower extremity venogram 4.   IVC filter placement 5.   Mechanical thrombectomy to right popliteal, superficial femoral, and common femoral vein with the penumbra 16 flash device    SURGEON: [name], MD  ASSISTANT(S): none  ANESTHESIA: local with moderate conscious sedation for 26 minutes using 1 mg of Versed and 50 mcg of Fentanyl  ESTIMATED BLOOD LOSS: 500 cc  CONTRAST: 15 cc  FLUORO TIME: 5.2 minutes  FINDING(S): 1.  Extensive DVT in the right popliteal, superficial femoral, and common femoral veins.  The iliac veins were somewhat decompressed but patent without obvious stenosis.  The IVC was widely patent.  SPECIMEN(S):  none  INDICATIONS:    Patient is a 82 y.o. male who presents with extensive right lower extremity DVT as well as a VQ scan suggesting bilateral PE.  He has renal insufficiency and we are not addressing the PE with procedures, but we are doing DVT thrombectomy and a filter placement with minimum of contrast.  Patient has marked leg swelling and pain.  Venous intervention is performed to reduce the symtpoms and avoid long term postphlebitic symptoms.    DESCRIPTION: After obtaining full informed written consent, the patient was brought back to the vascular suite and placed supine upon the table. Moderate conscious sedation was administered during a face to face encounter with the patient throughout the procedure with my supervision of the RN administering medicines and monitoring the patient's vital signs, pulse oximetry, telemetry and mental status throughout from the start of the procedure until the patient was taken to the recovery room.  After obtaining  adequate anesthesia, the patient was prepped and draped in the standard fashion.  The patient was then placed into the prone position.  The right popliteal vein was then accessed under direct ultrasound guidance without difficulty with a micropuncture needle and a permanent image was recorded.  I then upsized to an 8Fr sheath over a J wire.  A ProGlide device was placed in a preclose fashion and then we upsized to a 16 [place] sheath.  The delivery system for the argon option Elite IVC filter was then placed and used for imaging.  It was placed in the iliac vein which was found to be widely patent as was the IVC.  The renal veins were at the level of L1 and the filter was deployed at L2.  The delivery sheath was pulled down into the superficial femoral and popliteal veins where imaging showed extensive thrombosis throughout the popliteal vein, superficial femoral vein, and common femoral vein.  I used the Penumbra Cat 16 Flash catheter and evacuated about 500 cc of effluent with mechanical thrombectomy throughout the common femoral vein, superficial femoral vein, and popliteal veins.  This demonstrated marked improvement with near complete resolution of the thrombus throughout these veins.  Fluoroscopy was done and the filter was in straight orientation and at this point we elected to terminate the procedure.   The sheath was removed and the Pro-glide device was secured with excellent hemostatic result.  A 4-0 Monocryl was placed in the skin.  A dressing was placed.  He was taken to the recovery room in stable condition having tolerated the procedure  well.    COMPLICATIONS: None  CONDITION: Stable  [name] 06/13/2023 4:11 PM

## 2023-06-14 ENCOUNTER — Encounter: Payer: Self-pay | Admitting: Vascular Surgery

## 2023-06-14 ENCOUNTER — Inpatient Hospital Stay
Admit: 2023-06-14 | Discharge: 2023-06-14 | Disposition: A | Discharge status: Home / Self Care | Payer: HMO | Attending: Internal Medicine | Admitting: Internal Medicine

## 2023-06-14 DIAGNOSIS — I2609 Other pulmonary embolism with acute cor pulmonale: Secondary | ICD-10-CM

## 2023-06-14 DIAGNOSIS — I2699 Other pulmonary embolism without acute cor pulmonale: Secondary | ICD-10-CM | POA: Diagnosis not present

## 2023-06-14 LAB — BASIC METABOLIC PANEL
Anion gap: 9 (ref 5–15)
BUN: 62 mg/dL — ABNORMAL HIGH (ref 8–23)
CO2: 21 mmol/L — ABNORMAL LOW (ref 22–32)
Calcium: 8.3 mg/dL — ABNORMAL LOW (ref 8.9–10.3)
Chloride: 108 mmol/L (ref 98–111)
Creatinine, Ser: 3.51 mg/dL — ABNORMAL HIGH (ref 0.61–1.24)
GFR, Estimated: 17 mL/min — ABNORMAL LOW (ref >60)
Glucose, Bld: 121 mg/dL — ABNORMAL HIGH (ref 70–99)
Potassium: 5.3 mmol/L — ABNORMAL HIGH (ref 3.5–5.1)
Sodium: 138 mmol/L (ref 135–145)

## 2023-06-14 LAB — ECHOCARDIOGRAM COMPLETE
AR max vel: 1.88 cm2
AV Area VTI: 2.38 cm2
AV Area mean vel: 1.96 cm2
AV Mean grad: 3.5 mmHg
AV Peak grad: 6.6 mmHg
Ao pk vel: 1.28 m/s
Area-P 1/2: 3 cm2
Calc EF: 38.2 %
Height: 70 in
S' Lateral: 3.8 cm
Single Plane A2C EF: 29.9 %
Single Plane A4C EF: 42.5 %
Weight: 3809.55 oz

## 2023-06-14 LAB — URINALYSIS, COMPLETE (UACMP) WITH MICROSCOPIC
Bilirubin Urine: NEGATIVE
Glucose, UA: NEGATIVE mg/dL
Ketones, ur: NEGATIVE mg/dL
Nitrite: NEGATIVE
Protein, ur: 30 mg/dL — AB
Specific Gravity, Urine: 1.01 (ref 1.005–1.030)
WBC, UA: >50 WBC/hpf (ref 0–5)
pH: 5 (ref 5.0–8.0)

## 2023-06-14 LAB — CBC
HCT: 29.7 % — ABNORMAL LOW (ref 39.0–52.0)
Hemoglobin: 10 g/dL — ABNORMAL LOW (ref 13.0–17.0)
MCH: 31.3 pg (ref 26.0–34.0)
MCHC: 33.7 g/dL (ref 30.0–36.0)
MCV: 93.1 fL (ref 80.0–100.0)
Platelets: 150 10*3/uL (ref 150–400)
RBC: 3.19 MIL/uL — ABNORMAL LOW (ref 4.22–5.81)
RDW: 12.3 % (ref 11.5–15.5)

## 2023-06-14 LAB — POTASSIUM: Potassium: 4.8 mmol/L (ref 3.5–5.1)

## 2023-06-14 LAB — HEPARIN LEVEL (UNFRACTIONATED)
Heparin Unfractionated: 0.25 IU/mL — ABNORMAL LOW (ref 0.30–0.70)
Heparin Unfractionated: 0.53 IU/mL (ref 0.30–0.70)

## 2023-06-14 MED ORDER — SODIUM ZIRCONIUM CYCLOSILICATE 10 G PO PACK
10.0000 g | PACK | Freq: Once | ORAL | Status: AC
  Administered 2023-06-14: 10 g via ORAL
  Filled 2023-06-14: qty 1

## 2023-06-14 MED ORDER — HEPARIN BOLUS VIA INFUSION
1400.0000 [IU] | Freq: Once | INTRAVENOUS | Status: AC
  Administered 2023-06-14: 1400 [IU] via INTRAVENOUS
  Filled 2023-06-14: qty 1400

## 2023-06-14 MED ORDER — APIXABAN 5 MG PO TABS: 5.0000 mg | ORAL_TABLET | Freq: Two times a day (BID) | ORAL | Status: DC

## 2023-06-14 MED ORDER — APIXABAN 5 MG PO TABS
10.0000 mg | ORAL_TABLET | Freq: Two times a day (BID) | ORAL | Status: DC
  Administered 2023-06-14 – 2023-06-16 (×4): 10 mg via ORAL
  Filled 2023-06-14 (×4): qty 2

## 2023-06-14 NOTE — Progress Notes (Addendum)
Progress Note    06/14/2023 10:50 AM 1 Day Post-Op  Subjective: Sean [name] is an 82 yo male who is now POD #1 from a right lower extremity venous thrombectomy. He is recovering as expected. Right leg is wrapped with coban to help reduce overall swelling of his leg. He denies any pain or shortness of breath this morning. He remains on a heparin infusion. Eating well. No complaints overnight and vitals all remain stable.    Vitals:   06/14/23 0306 06/14/23 0919  BP: 117/68 138/81  Pulse: 75 74  Resp: 20 20  Temp: 97.8 F (36.6 C) 97.9 F (36.6 C)  SpO2: 98% 98%   Physical Exam: Cardiac:  RRR, Normal S1, S2 no murmurs Lungs:  Clear throughout on auscultation, no rales rhonchi or wheezing Incisions:  Right popliteal incision site with dressing. Clean dry intact without hematoma.  Extremities:  Right lower extremity with non palpable pulse due to edema and coban wrap. Left lower extremity with palpable pulses.  Abdomen:  Positive bowel sounds throughout, soft, non tender and non distended.  Neurologic: AAOX3 and follows all commands appropriately.   CBC    Component Value Date/Time   WBC 14.0 (H) 06/14/2023 0557   RBC 3.19 (L) 06/14/2023 0557   HGB 10.0 (L) 06/14/2023 0557   HGB 13.2 02/03/2019 1015   HCT 29.7 (L) 06/14/2023 0557   HCT 38.7 02/03/2019 1015   PLT 150 06/14/2023 0557   PLT 202 02/03/2019 1015   MCV 93.1 06/14/2023 0557   MCV 92 02/03/2019 1015   MCV 97 08/13/2014 1955   MCH 31.3 06/14/2023 0557   MCHC 33.7 06/14/2023 0557   RDW 12.3 06/14/2023 0557   RDW 13.4 02/03/2019 1015   RDW 13.8 08/13/2014 1955   LYMPHSABS 1.4 06/12/2023 1414   LYMPHSABS 1.5 02/03/2019 1015   LYMPHSABS 2.2 08/13/2014 1955   MONOABS 1.3 (H) 06/12/2023 1414   MONOABS 0.6 08/13/2014 1955   EOSABS 0.2 06/12/2023 1414   EOSABS 0.4 02/03/2019 1015   EOSABS 0.3 08/13/2014 1955   BASOSABS 0.1 06/12/2023 1414   BASOSABS 0.0 02/03/2019 1015   BASOSABS 0.1 08/13/2014 1955    BMET     Component Value Date/Time   NA 138 06/14/2023 0557   NA 142 01/05/2022 1316   NA 141 08/14/2014 0418   K 5.3 (H) 06/14/2023 0557   K 3.6 08/14/2014 0418   CL 108 06/14/2023 0557   CL 106 08/14/2014 0418   CO2 21 (L) 06/14/2023 0557   CO2 29 08/14/2014 0418   GLUCOSE 121 (H) 06/14/2023 0557   GLUCOSE 106 (H) 08/14/2014 0418   BUN 62 (H) 06/14/2023 0557   BUN 27 01/05/2022 1316   BUN 30 (H) 08/14/2014 0418   CREATININE 3.51 (H) 06/14/2023 0557   CREATININE 1.18 08/14/2014 1227   CALCIUM 8.3 (L) 06/14/2023 0557   CALCIUM 8.3 (L) 08/14/2014 0418   GFRNONAA 17 (L) 06/14/2023 0557   GFRNONAA >60 08/14/2014 1227   GFRAA 47 (L) 03/01/2020 0859   GFRAA >60 08/14/2014 1227    INR    Component Value Date/Time   INR 1.2 06/12/2023 1414   INR 1.0 08/13/2014 1955     Intake/Output Summary (Last 24 hours) at 06/14/2023 1050 Last data filed at 06/14/2023 1032 Gross per 24 hour  Intake 3527.02 ml  Output 3875 ml  Net -347.98 ml     Assessment/Plan:  82 y.o. male is s/p right lower extremity venous thrombectomy  to right popliteal, superficial femoral,  and common femoral vein with IVC Filter placement.    PLAN: Convert from Heparin infusion to oral anticoagulation Eliquis 10 mg BID X 7 days then drop to 5 mg BID for the next year.  No intervention planned for PE. This covered by current anticoagulation. Vascular Surgery Okay with patient discharge today once on oral anticoagulation.  Patient needs to follow up with Nephrology Patient needs to follow up with Cardiology Patient to follow up with Vascular Surgery in 1 month with bilateral Duplex ultrasound of lower extremities.    DVT prophylaxis:  Heparin Infusion.    Marcie Bal [facility] 06/14/2023 10:50 AM

## 2023-06-14 NOTE — Progress Notes (Signed)
*  PRELIMINARY RESULTS* Echocardiogram 2D Echocardiogram has been performed.  Cristela Blue 06/14/2023, 11:24 AM

## 2023-06-14 NOTE — Progress Notes (Addendum)
PROGRESS NOTE    Sean [name].  WUJ:[id number] DOB: May 05, 1941 DOA: 06/12/2023 PCP: Jerl Mina, MD   Assessment & Plan:   Principal Problem:   Acute pulmonary embolism (HCC) Active Problems:   DVT, lower extremity, proximal, acute, right (HCC)   Acute kidney injury superimposed on chronic kidney disease (HCC)   Essential hypertension   Dyslipidemia   BPH (benign prostatic hyperplasia)   Gout  Assessment and Plan: Acute pulmonary embolism: d/c IV heparin and start eliquis & continue eliquis x 1 year as per vasc surg. Echo pending   Right LE DVT: extensive. D/c IV heparin & start eliquis. Will need eliquis x 1 year as per vasc surg. S/p mechanical thrombectomy & IVC filter placement 06/13/23 as per vasc surg   AKI on CKDIIIb: Cr is trending down again today. Avoid nephrotoxic meds. Continue on IVFs. Nephro following and recs apprec   Hyperkalemia: lokelma x 1 ordered. Repeat potassium ordered  HTN: continue on metorpolol. Holding aldactone, losartan   HLD: continue on statin   BPH: continue on flomax   Urine retention: foley was placed in the ER. Will need voiding trial prior to d/c   Gout: continue on allopurinol       DVT prophylaxis: eliquis  Code Status: DNR Family Communication: discussed pt's care w/ pt's care w/ pt's family at bedside and answered their questions  Disposition Plan: depends on PT/OT recs  Status is: Inpatient Remains inpatient appropriate because: severity of illness    Level of care: Progressive Consultants:  Vasc surg  Nephro   Procedures:   Antimicrobials:    Subjective: Pt c/o fatigue  Objective: Vitals:   06/13/23 1802 06/13/23 1942 06/14/23 0036 06/14/23 0306  BP: 119/72 139/71 (!) 145/85 117/68  Pulse: 75 (!) 56 77 75  Resp: 20 (!) 22 (!) 22 20  Temp: 98 F (36.7 C) 97.9 F (36.6 C) (!) 97.5 F (36.4 C) 97.8 F (36.6 C)  TempSrc: Oral   Axillary  SpO2: 91% (!) 76% 95% 98%  Weight:      Height:         Intake/Output Summary (Last 24 hours) at 06/14/2023 0847 Last data filed at 06/14/2023 0300 Gross per 24 hour  Intake 3047.02 ml  Output 3875 ml  Net -827.98 ml   Filed Weights   06/12/23 1336 06/13/23 1458  Weight: 108 kg 108 kg    Examination:  General exam: appears comfortable  Respiratory system: decreased breath sounds b/l  Cardiovascular system: S1/S2+. No rubs or clicks  Gastrointestinal system: Abd is soft, NT, obese & hypoactive bowel sounds  Central nervous system: alert & awake. Moves all extremities  Psychiatry: judgement and insight appears at baseline. Flat mood and affect     Data Reviewed: I have personally reviewed following labs and imaging studies  CBC: Recent Labs  Lab 06/12/23 1414 06/13/23 0442 06/14/23 0557  WBC 14.3* 12.0* 14.0*  NEUTROABS 11.2*  --   --   HGB 11.9* 10.5* 10.0*  HCT 36.7* 31.0* 29.7*  MCV 95.6 92.8 93.1  PLT 148* 148* 150   Basic Metabolic Panel: Recent Labs  Lab 06/12/23 1414 06/13/23 0442 06/14/23 0557  NA 134* 135 138  K 5.1 4.8 5.3*  CL 102 106 108  CO2 22 23 21*  GLUCOSE 133* 134* 121*  BUN 82* 84* 62*  CREATININE 4.75* 4.53* 3.51*  CALCIUM 9.6 8.8* 8.3*   GFR: Estimated Creatinine Clearance: 20.3 mL/min (A) (by C-G formula based on SCr of 3.51  mg/dL (H)). Liver Function Tests: Recent Labs  Lab 06/12/23 1414  AST 13*  ALT 9  ALKPHOS 44  BILITOT 1.4*  PROT 7.6  ALBUMIN 4.5   No results for input(s): "LIPASE", "AMYLASE" in the last 168 hours. No results for input(s): "AMMONIA" in the last 168 hours. Coagulation Profile: Recent Labs  Lab 06/12/23 1414  INR 1.2   Cardiac Enzymes: Recent Labs  Lab 06/13/23 0442  CKTOTAL 43*   BNP (last 3 results) No results for input(s): "PROBNP" in the last 8760 hours. HbA1C: No results for input(s): "HGBA1C" in the last 72 hours. CBG: No results for input(s): "GLUCAP" in the last 168 hours. Lipid Profile: No results for input(s): "CHOL", "HDL",  "LDLCALC", "TRIG", "CHOLHDL", "LDLDIRECT" in the last 72 hours. Thyroid Function Tests: No results for input(s): "TSH", "T4TOTAL", "FREET4", "T3FREE", "THYROIDAB" in the last 72 hours. Anemia Panel: No results for input(s): "VITAMINB12", "FOLATE", "FERRITIN", "TIBC", "IRON", "RETICCTPCT" in the last 72 hours. Sepsis Labs: No results for input(s): "PROCALCITON", "LATICACIDVEN" in the last 168 hours.  No results found for this or any previous visit (from the past 240 hour(s)).       Radiology Studies: PERIPHERAL VASCULAR CATHETERIZATION  Result Date: 06/14/2023 See surgical note for result.  NM Pulmonary Perfusion  Result Date: 06/12/2023 CLINICAL DATA:  Pulmonary embolism. EXAM: NUCLEAR MEDICINE PERFUSION LUNG SCAN Two-view chest x-ray TECHNIQUE: Perfusion images were obtained in multiple projections after intravenous injection of radiopharmaceutical. Ventilation scans intentionally deferred if perfusion scan and chest x-ray adequate for interpretation during COVID 19 epidemic. Standard two-view sitting chest x-ray obtained. RADIOPHARMACEUTICALS:  4.31 mCi Tc-88m MAA IV COMPARISON:  None Available. FINDINGS: Chest x-ray demonstrates status post median sternotomy. Left upper chest pacemaker with a additional leads. No consolidation, pneumothorax or effusion. No edema. On perfusion there are several large defects identified including left upper lobe, bilateral lower lobes peripherally. IMPRESSION: High probability perfusion scan. Critical Value/emergent results were called by telephone at the time of interpretation on 06/12/2023 at 1:59 pm to provider NEHA RAY ; Donna Bernard , who verbally acknowledged these results. Electronically Signed   By: Karen Kays M.D.   On: 06/12/2023 17:12   DG Chest 2 View  Result Date: 06/12/2023 CLINICAL DATA:  Pulmonary embolism. EXAM: NUCLEAR MEDICINE PERFUSION LUNG SCAN Two-view chest x-ray TECHNIQUE: Perfusion images were obtained in multiple projections after  intravenous injection of radiopharmaceutical. Ventilation scans intentionally deferred if perfusion scan and chest x-ray adequate for interpretation during COVID 19 epidemic. Standard two-view sitting chest x-ray obtained. RADIOPHARMACEUTICALS:  4.31 mCi Tc-55m MAA IV COMPARISON:  None Available. FINDINGS: Chest x-ray demonstrates status post median sternotomy. Left upper chest pacemaker with a additional leads. No consolidation, pneumothorax or effusion. No edema. On perfusion there are several large defects identified including left upper lobe, bilateral lower lobes peripherally. IMPRESSION: High probability perfusion scan. Critical Value/emergent results were called by telephone at the time of interpretation on 06/12/2023 at 1:59 pm to provider NEHA RAY ; Donna Bernard , who verbally acknowledged these results. Electronically Signed   By: Karen Kays M.D.   On: 06/12/2023 17:12   US Venous Img Lower Unilateral Right (DVT)  Result Date: 06/12/2023 CLINICAL DATA:  Right lower extremity pain and swelling since last Saturday EXAM: RIGHT LOWER EXTREMITY VENOUS DOPPLER ULTRASOUND TECHNIQUE: Gray-scale sonography with graded compression, as well as color Doppler and duplex ultrasound were performed to evaluate the lower extremity deep venous systems from the level of the common femoral vein and including the common femoral,  femoral, profunda femoral, popliteal and calf veins including the posterior tibial, peroneal and gastrocnemius veins when visible. The superficial great saphenous vein was also interrogated. Spectral Doppler was utilized to evaluate flow at rest and with distal augmentation maneuvers in the common femoral, femoral and popliteal veins. COMPARISON:  None Available. FINDINGS: Contralateral Common Femoral Vein: Respiratory phasicity is normal and symmetric with the symptomatic side. No evidence of thrombus. Normal compressibility. Common Femoral Vein: No evidence of thrombus. Normal compressibility,  respiratory phasicity and response to augmentation. Saphenofemoral Junction: No evidence of thrombus. Normal compressibility and flow on color Doppler imaging. Profunda Femoral Vein: No evidence of thrombus. Normal compressibility and flow on color Doppler imaging. Femoral Vein: Mixed echogenicity intraluminal thrombus. Vessel noncompressible. Thrombus appears occlusive. Popliteal Vein: Similar intraluminal mixed echogenicity thrombus appearing nearly occlusive. Vessel partially compressible. Calf Veins: Thrombus does appear to extend into the tibial and peroneal calf veins to some degree with partial compressibility. Superficial Great Saphenous Vein: Proximal GSV superficial thrombosis noted without occlusion. Vessel partially compressible. IMPRESSION: Positive exam for right femoropopliteal DVT extending into the calf veins. Proximal right GSV superficial thrombosis as well. Electronically Signed   By: Judie Petit.  Shick M.D.   On: 06/12/2023 13:05        Scheduled Meds:  allopurinol  100 mg Oral QODAY   aspirin EC  81 mg Oral Daily   atorvastatin  40 mg Oral Daily   Chlorhexidine Gluconate Cloth  6 each Topical Daily   loratadine  10 mg Oral Daily   metoprolol succinate  25 mg Oral Daily   multivitamin with minerals  1 tablet Oral Daily   sodium zirconium cyclosilicate  10 g Oral Once   tamsulosin  0.4 mg Oral Daily   Continuous Infusions:  sodium chloride 100 mL/hr at 06/14/23 0300   heparin 1,550 Units/hr (06/14/23 0300)     LOS: 2 days    Time spent: 35 mins     Charise Killian, MD [facility] Pager [phone]  If 7PM-7AM, please contact night-coverage [email or web address] 06/14/2023, 8:47 AM

## 2023-06-14 NOTE — Progress Notes (Addendum)
[facility]  ROUNDING NOTE   Subjective:   Sean [name]. Is a 82 y.o. male with past medical history of CHF, diabetes, CAD s/p CABG, hypertension and chornic kidney disease stage IIIb. Patient presents to the ED at the advise of nephrologist due to right leg edema. Patient has been admitted for Acute pulmonary embolism (HCC) [I26.99] DVT, lower extremity, proximal, acute, right (HCC) [I82.4Y1]  Patient is known to our practice and is followed by Dr Suezanne Jacquet outpatient.  Patient seen resting quietly, easily aroused Alert and oriented No family present Remains on room air Mild lower extremity edema noted  Creatinine 3.51 Urine output 3375 mL in previous 24 hours   Objective:  Vital signs in last 24 hours:  Temp:  [97.5 F (36.4 C)-98.3 F (36.8 C)] 97.9 F (36.6 C) (07/25 0919) Pulse Rate:  [56-96] 74 (07/25 0919) Resp:  [14-22] 20 (07/25 0919) BP: (117-158)/(68-108) 138/81 (07/25 0919) SpO2:  [76 %-100 %] 98 % (07/25 0919) Weight:  [161 kg] 108 kg (07/24 1458)  Weight change: 0 kg Filed Weights   06/12/23 1336 06/13/23 1458  Weight: 108 kg 108 kg    Intake/Output: I/O last 3 completed shifts: In: 3317.1 [P.O.:600; I.V.:2617.1; IV Piggyback:100] Out: 6575 [Urine:6075; Blood:500]   Intake/Output this shift:  Total I/O In: 480 [P.O.:480] Out: -   Physical Exam: General: NAD  Head: Normocephalic, atraumatic. Moist oral mucosal membranes  Eyes: Anicteric  Lungs:  Clear to auscultation  Heart: Regular rate and rhythm  Abdomen:  Soft, nontender, distended  Extremities: R leg peripheral edema more significant than left.  Neurologic: Alert and oriented, moving all four extremities  Skin: No lesions  Access: None  GU - Foley  Basic Metabolic Panel: Recent Labs  Lab 06/12/23 1414 06/13/23 0442 06/14/23 0557  NA 134* 135 138  K 5.1 4.8 5.3*  CL 102 106 108  CO2 22 23 21*  GLUCOSE 133* 134* 121*  BUN 82* 84* 62*  CREATININE 4.75* 4.53*  3.51*  CALCIUM 9.6 8.8* 8.3*    Liver Function Tests: Recent Labs  Lab 06/12/23 1414  AST 13*  ALT 9  ALKPHOS 44  BILITOT 1.4*  PROT 7.6  ALBUMIN 4.5   No results for input(s): "LIPASE", "AMYLASE" in the last 168 hours. No results for input(s): "AMMONIA" in the last 168 hours.  CBC: Recent Labs  Lab 06/12/23 1414 06/13/23 0442 06/14/23 0557  WBC 14.3* 12.0* 14.0*  NEUTROABS 11.2*  --   --   HGB 11.9* 10.5* 10.0*  HCT 36.7* 31.0* 29.7*  MCV 95.6 92.8 93.1  PLT 148* 148* 150    Cardiac Enzymes: Recent Labs  Lab 06/13/23 0442  CKTOTAL 43*    BNP: Invalid input(s): "POCBNP"  CBG: No results for input(s): "GLUCAP" in the last 168 hours.  Microbiology: Results for orders placed or performed during the hospital encounter of 01/12/21  SARS CORONAVIRUS 2 (TAT 6-24 HRS) Nasopharyngeal Nasopharyngeal Swab     Status: None   Collection Time: 01/12/21 12:14 PM   Specimen: Nasopharyngeal Swab  Result Value Ref Range Status   SARS Coronavirus 2 NEGATIVE NEGATIVE Final    Comment: (NOTE) SARS-CoV-2 target nucleic acids are NOT DETECTED.  The SARS-CoV-2 RNA is generally detectable in upper and lower respiratory specimens during the acute phase of infection. Negative results do not preclude SARS-CoV-2 infection, do not rule out co-infections with other pathogens, and should not be used as the sole basis for treatment or other patient management decisions. Negative  results must be combined with clinical observations, patient history, and epidemiological information. The expected result is Negative.  Fact Sheet for Patients: HairSlick.no  Fact Sheet for Healthcare Providers: quierodirigir.com  This test is not yet approved or cleared by the [place] FDA and  has been authorized for detection and/or diagnosis of SARS-CoV-2 by FDA under an Emergency Use Authorization (EUA). This EUA will remain  in effect  (meaning this test can be used) for the duration of the COVID-19 declaration under Se ction 564(b)(1) of the Act, 21 U.S.C. section 360bbb-3(b)(1), unless the authorization is terminated or revoked sooner.  Performed at [facility], [address], [place], [place] [address]     Coagulation Studies: Recent Labs    06/12/23 1414  LABPROT 15.8*  INR 1.2    Urinalysis: Recent Labs    06/14/23 0039  COLORURINE YELLOW*  LABSPEC 1.010  PHURINE 5.0  GLUCOSEU NEGATIVE  HGBUR LARGE*  BILIRUBINUR NEGATIVE  KETONESUR NEGATIVE  PROTEINUR 30*  NITRITE NEGATIVE  LEUKOCYTESUR LARGE*      Imaging: PERIPHERAL VASCULAR CATHETERIZATION  Result Date: 06/14/2023 See surgical note for result.  NM Pulmonary Perfusion  Result Date: 06/12/2023 CLINICAL DATA:  Pulmonary embolism. EXAM: NUCLEAR MEDICINE PERFUSION LUNG SCAN Two-view chest x-ray TECHNIQUE: Perfusion images were obtained in multiple projections after intravenous injection of radiopharmaceutical. Ventilation scans intentionally deferred if perfusion scan and chest x-ray adequate for interpretation during COVID 19 epidemic. Standard two-view sitting chest x-ray obtained. RADIOPHARMACEUTICALS:  4.31 mCi Tc-92m MAA IV COMPARISON:  None Available. FINDINGS: Chest x-ray demonstrates status post median sternotomy. Left upper chest pacemaker with a additional leads. No consolidation, pneumothorax or effusion. No edema. On perfusion there are several large defects identified including left upper lobe, bilateral lower lobes peripherally. IMPRESSION: High probability perfusion scan. Critical Value/emergent results were called by telephone at the time of interpretation on 06/12/2023 at 1:59 pm to provider NEHA RAY ; Donna Bernard , who verbally acknowledged these results. Electronically Signed   By: Karen Kays M.D.   On: 06/12/2023 17:12   DG Chest 2 View  Result Date: 06/12/2023 CLINICAL DATA:  Pulmonary embolism. EXAM: NUCLEAR MEDICINE  PERFUSION LUNG SCAN Two-view chest x-ray TECHNIQUE: Perfusion images were obtained in multiple projections after intravenous injection of radiopharmaceutical. Ventilation scans intentionally deferred if perfusion scan and chest x-ray adequate for interpretation during COVID 19 epidemic. Standard two-view sitting chest x-ray obtained. RADIOPHARMACEUTICALS:  4.31 mCi Tc-9m MAA IV COMPARISON:  None Available. FINDINGS: Chest x-ray demonstrates status post median sternotomy. Left upper chest pacemaker with a additional leads. No consolidation, pneumothorax or effusion. No edema. On perfusion there are several large defects identified including left upper lobe, bilateral lower lobes peripherally. IMPRESSION: High probability perfusion scan. Critical Value/emergent results were called by telephone at the time of interpretation on 06/12/2023 at 1:59 pm to provider NEHA RAY ; Donna Bernard , who verbally acknowledged these results. Electronically Signed   By: Karen Kays M.D.   On: 06/12/2023 17:12   US Venous Img Lower Unilateral Right (DVT)  Result Date: 06/12/2023 CLINICAL DATA:  Right lower extremity pain and swelling since last Saturday EXAM: RIGHT LOWER EXTREMITY VENOUS DOPPLER ULTRASOUND TECHNIQUE: Gray-scale sonography with graded compression, as well as color Doppler and duplex ultrasound were performed to evaluate the lower extremity deep venous systems from the level of the common femoral vein and including the common femoral, femoral, profunda femoral, popliteal and calf veins including the posterior tibial, peroneal and gastrocnemius veins when visible. The superficial  great saphenous vein was also interrogated. Spectral Doppler was utilized to evaluate flow at rest and with distal augmentation maneuvers in the common femoral, femoral and popliteal veins. COMPARISON:  None Available. FINDINGS: Contralateral Common Femoral Vein: Respiratory phasicity is normal and symmetric with the symptomatic side. No  evidence of thrombus. Normal compressibility. Common Femoral Vein: No evidence of thrombus. Normal compressibility, respiratory phasicity and response to augmentation. Saphenofemoral Junction: No evidence of thrombus. Normal compressibility and flow on color Doppler imaging. Profunda Femoral Vein: No evidence of thrombus. Normal compressibility and flow on color Doppler imaging. Femoral Vein: Mixed echogenicity intraluminal thrombus. Vessel noncompressible. Thrombus appears occlusive. Popliteal Vein: Similar intraluminal mixed echogenicity thrombus appearing nearly occlusive. Vessel partially compressible. Calf Veins: Thrombus does appear to extend into the tibial and peroneal calf veins to some degree with partial compressibility. Superficial Great Saphenous Vein: Proximal GSV superficial thrombosis noted without occlusion. Vessel partially compressible. IMPRESSION: Positive exam for right femoropopliteal DVT extending into the calf veins. Proximal right GSV superficial thrombosis as well. Electronically Signed   By: Judie Petit.  Shick M.D.   On: 06/12/2023 13:05     Medications:    sodium chloride 100 mL/hr at 06/14/23 1006   heparin 1,550 Units/hr (06/14/23 0300)    allopurinol  100 mg Oral QODAY   aspirin EC  81 mg Oral Daily   atorvastatin  40 mg Oral Daily   Chlorhexidine Gluconate Cloth  6 each Topical Daily   loratadine  10 mg Oral Daily   metoprolol succinate  25 mg Oral Daily   multivitamin with minerals  1 tablet Oral Daily   tamsulosin  0.4 mg Oral Daily   acetaminophen **OR** acetaminophen, magnesium hydroxide, ondansetron **OR** [DISCONTINUED] ondansetron (ZOFRAN) IV, traZODone  Assessment/ Plan:  Mr. Hawkin Charo. is a 82 y.o.  male with past medical history of CHF, diabetes, CAD s/p CABG, hypertension and chornic kidney disease stage IIIb. Patient presents to the ED at the advise of nephrologist due to right leg edema. Patient has been admitted for Acute pulmonary embolism (HCC)  [I26.99] DVT, lower extremity, proximal, acute, right (HCC) [I82.4Y1]   Acute Kidney Injury on chronic kidney disease stage IIIb with baseline creatinine 1.8 and GFR of 38 on 12/13/22.  Acute kidney injury likely secondary to hemodynamic instability due to recent acute PE/DVT and status post fall Chronic kidney disease is secondary to diabetes and hypertension. Creatinine elevated on admission, 4.75.   Creatinine greatly improved today with IV fluids, 3.51.  Adequate urine output noted greater than 3 L.  UA appears hazy with hematuria and leukocytes.  Lab Results  Component Value Date   CREATININE 3.51 (H) 06/14/2023   CREATININE 4.53 (H) 06/13/2023   CREATININE 4.75 (H) 06/12/2023    Intake/Output Summary (Last 24 hours) at 06/14/2023 1054 Last data filed at 06/14/2023 1032 Gross per 24 hour  Intake 3527.02 ml  Output 3875 ml  Net -347.98 ml    2. Anemia of chronic kidney disease Lab Results  Component Value Date   HGB 10.0 (L) 06/14/2023    Hgb at goal  3. Hypertension with chronic kidney disease. Home regimen includes losartan, metoprolol and spironolactone. Only receiving metoprolol.  Blood pressure stable for this patient  4. Acute pulmonary embolism with right lower extremity DVT. Seen on chest xray and NM perfusion study. Placed on Heparin drip.  Vascular surgery performed right lower extremity venogram with IVC placement and mechanical thrombectomy.   LOS: 2 Ashiyah Pavlak 7/25/202410:54 AM

## 2023-06-14 NOTE — Consult Note (Signed)
ANTICOAGULATION CONSULT NOTE   Pharmacy Consult for Heparin Infusion Indication: DVT  Allergies  Allergen Reactions   Azithromycin Hives   Erythromycin Hives    Patient Measurements: Height: 5\' 10"  (177.8 cm) Weight: 108 kg (238 lb 1.6 oz) IBW/kg (Calculated) : 73 Heparin Dosing Weight: 96.3 kg  Vital Signs: Temp: 97.9 F (36.6 C) (07/25 0919) Temp Source: Axillary (07/25 0306) BP: 138/81 (07/25 0919) Pulse Rate: 74 (07/25 0919)  Labs: Recent Labs    06/12/23 1414 06/12/23 1828 06/12/23 2335 06/13/23 0442 06/13/23 0912 06/14/23 0013 06/14/23 0557 06/14/23 0913  HGB 11.9*  --   --  10.5*  --   --  10.0*  --   HCT 36.7*  --   --  31.0*  --   --  29.7*  --   PLT 148*  --   --  148*  --   --  150  --   APTT 35  --   --   --   --   --   --   --   LABPROT 15.8*  --   --   --   --   --   --   --   INR 1.2  --   --   --   --   --   --   --   HEPARINUNFRC  --   --    < >  --  0.59 0.25*  --  0.53  CREATININE 4.75*  --   --  4.53*  --   --  3.51*  --   CKTOTAL  --   --   --  43*  --   --   --   --   TROPONINIHS 3 3  --   --   --   --   --   --    < > = values in this interval not displayed.    Estimated Creatinine Clearance: 20.3 mL/min (A) (by C-G formula based on SCr of 3.51 mg/dL (H)).   Medical History: Past Medical History:  Diagnosis Date   Arthritis    Asthma    CAD (coronary artery disease)    a. LHC 7/19: ostLM 30%, ostLAD 30%, p-mLAD 99% mod calcified, ostD1 90%, ost-pLCx 80% ulcerative & mod calcified, p-mLCx 50%, OM2 50%, mod dz LPDA, RCA small w/ diffuse dz throughout; b. recommendation of CABG; c. 3-V CABG 08/26/18 (LIMA-LAD, VG-Diag, VG-LCx)   Chronic systolic CHF (congestive heart failure) (HCC)    a. EF 35-40%, diffuse HK with HK of the apical, anteroseptal, and anterior myocardium, Gr1DD, mild MR, mildly dilated LA, pacer wire noted in the RV with nl RVSF, PASP nl   CKD (chronic kidney disease), stage III (HCC)    Deep vein thrombosis (DVT) of upper  extremity (HCC)    a. DVT of left subclavian dx'd 06/17/2018; b. Eliquis   GERD (gastroesophageal reflux disease)    takes otc occasionally   History of complete heart block    a. s/p MDT PPM 05/2017   Hypertension    Obesity    Presence of permanent cardiac pacemaker    Dr. Rosette Reveal implanted 05/2017   PVC's (premature ventricular contractions)    a. PPM-induced   Assessment: Sean [name]. is a 82 y.o. male presenting with DVT. PMH significant for CKD, CAD, CHF, gout, iCMP, AF. Patient was not on [facility] PTA per chart review. [place] positive for right femoropopliteal DVT extending into the calf veins. Pharmacy has  been consulted to initiate and manage heparin infusion.   Baseline Labs: aPTT 35, PT 15.8, INR 1.2, Hgb 11.9, Hct 36.7, Plt 148   Goal of Therapy:  Heparin level 0.3-0.7 units/ml Monitor platelets by anticoagulation protocol: Yes  07/24  0912  HL 0.59   therapeutic 07/25  0013  HL 0.25   subtherapeutic 07/25  0913  HL 0.53   therapeutic  Heparin level is therapeutic this morning at 0.53 on 1550 units/hour. Per RN, no issues with infusion reported, no signs/symptoms of bleeding noted. CBC is stable.   Plan: Continue heparin infusion at 1550 units/hour Check confirmatory Anti-Xa level in 8 hours Monitor daily Anti-Xa levels when therapeutic Continue to monitor H&H and platelets daily while on heparin infusion   Thank you for involving pharmacy in this patient's care.   Rockwell Alexandria, PharmD Clinical Pharmacist 06/14/2023 10:44 AM

## 2023-06-14 NOTE — Consult Note (Signed)
ANTICOAGULATION CONSULT NOTE   Pharmacy Consult for Eliquis Indication: DVT  Allergies  Allergen Reactions   Azithromycin Hives   Erythromycin Hives    Patient Measurements: Height: 5\' 10"  (177.8 cm) Weight: 108 kg (238 lb 1.6 oz) IBW/kg (Calculated) : 73  Vital Signs: Temp: 97.9 F (36.6 C) (07/25 1129) Temp Source: Axillary (07/25 0306) BP: 131/67 (07/25 1129) Pulse Rate: 74 (07/25 1129)  Labs: Recent Labs    06/12/23 1414 06/12/23 1828 06/12/23 2335 06/13/23 0442 06/13/23 0912 06/14/23 0013 06/14/23 0557 06/14/23 0913  HGB 11.9*  --   --  10.5*  --   --  10.0*  --   HCT 36.7*  --   --  31.0*  --   --  29.7*  --   PLT 148*  --   --  148*  --   --  150  --   APTT 35  --   --   --   --   --   --   --   LABPROT 15.8*  --   --   --   --   --   --   --   INR 1.2  --   --   --   --   --   --   --   HEPARINUNFRC  --   --    < >  --  0.59 0.25*  --  0.53  CREATININE 4.75*  --   --  4.53*  --   --  3.51*  --   CKTOTAL  --   --   --  43*  --   --   --   --   TROPONINIHS 3 3  --   --   --   --   --   --    < > = values in this interval not displayed.    Estimated Creatinine Clearance: 20.3 mL/min (A) (by C-G formula based on SCr of 3.51 mg/dL (H)).   Medical History: Past Medical History:  Diagnosis Date   Arthritis    Asthma    CAD (coronary artery disease)    a. LHC 7/19: ostLM 30%, ostLAD 30%, p-mLAD 99% mod calcified, ostD1 90%, ost-pLCx 80% ulcerative & mod calcified, p-mLCx 50%, OM2 50%, mod dz LPDA, RCA small w/ diffuse dz throughout; b. recommendation of CABG; c. 3-V CABG 08/26/18 (LIMA-LAD, VG-Diag, VG-LCx)   Chronic systolic CHF (congestive heart failure) (HCC)    a. 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 DVT of left  subclavian dx'd 06/17/2018; b. Eliquis   GERD (gastroesophageal reflux disease)    takes otc occasionally   History of complete heart block    a. s/p MDT PPM 05/2017   Hypertension    Obesity    Presence of permanent cardiac pacemaker    Dr. Rosette Reveal implanted 05/2017   PVC's (premature ventricular contractions)    a. PPM-induced   Assessment: Sean [name]. is a 82 y.o. male presenting with DVT. PMH significant for CKD, CAD, CHF, gout, iCMP, AF. Patient was not on [facility] PTA per chart review. [place] positive for right femoropopliteal DVT extending into the calf veins. Pharmacy has been consulted to transition from heparin  infusion to Eliquis for DVT treatment.  Baseline Labs: aPTT 35, PT 15.8, INR 1.2, Hgb 11.9, Hct 36.7, Plt 148   Heparin level is therapeutic this morning at 0.53 on 1550 units/hour. Per RN, no issues with infusion reported, no signs/symptoms of bleeding noted. CBC is stable.   Plan: Continue heparin infusion at 1550 units/hour until 2259 Start Eliquis 10mg  BID x 7 days, then 5 mg BID for 1 year (1st dose today 7/25 @2200 )  Sean [name] PharmD, BCPS 06/14/2023 3:03 PM

## 2023-06-14 NOTE — Consult Note (Signed)
ANTICOAGULATION CONSULT NOTE   Pharmacy Consult for Heparin Infusion Indication: DVT  Allergies  Allergen Reactions   Azithromycin Hives   Erythromycin Hives    Patient Measurements: Height: 5\' 10"  (177.8 cm) Weight: 108 kg (238 lb 1.6 oz) IBW/kg (Calculated) : 73 Heparin Dosing Weight: 96.3 kg  Vital Signs: Temp: 97.5 F (36.4 C) (07/25 0036) Temp Source: Oral (07/24 1802) BP: 145/85 (07/25 0036) Pulse Rate: 77 (07/25 0036)  Labs: Recent Labs    06/12/23 1414 06/12/23 1828 06/12/23 2335 06/13/23 0442 06/13/23 0912 06/14/23 0013  HGB 11.9*  --   --  10.5*  --   --   HCT 36.7*  --   --  31.0*  --   --   PLT 148*  --   --  148*  --   --   APTT 35  --   --   --   --   --   LABPROT 15.8*  --   --   --   --   --   INR 1.2  --   --   --   --   --   HEPARINUNFRC  --   --  0.88*  --  0.59 0.25*  CREATININE 4.75*  --   --  4.53*  --   --   CKTOTAL  --   --   --  43*  --   --   TROPONINIHS 3 3  --   --   --   --     Estimated Creatinine Clearance: 15.7 mL/min (A) (by C-G formula based on SCr of 4.53 mg/dL (H)).   Medical History: Past Medical History:  Diagnosis Date   Arthritis    Asthma    CAD (coronary artery disease)    a. LHC 7/19: ostLM 30%, ostLAD 30%, p-mLAD 99% mod calcified, ostD1 90%, ost-pLCx 80% ulcerative & mod calcified, p-mLCx 50%, OM2 50%, mod dz LPDA, RCA small w/ diffuse dz throughout; b. recommendation of CABG; c. 3-V CABG 08/26/18 (LIMA-LAD, VG-Diag, VG-LCx)   Chronic systolic CHF (congestive heart failure) (HCC)    a. 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 DVT of left subclavian dx'd 06/17/2018; b. Eliquis   GERD (gastroesophageal reflux disease)    takes otc occasionally   History of complete heart block    a.  s/p MDT PPM 05/2017   Hypertension    Obesity    Presence of permanent cardiac pacemaker    Dr. Rosette Reveal implanted 05/2017   PVC's (premature ventricular contractions)    a. PPM-induced    Assessment: Sean [name]. is a 82 y.o. male presenting with DVT. PMH significant for CKD, CAD, CHF, gout, iCMP, AF. Patient was not on [facility] PTA per chart review. [place] positive for right femoropopliteal DVT extending into the calf veins. Pharmacy has been consulted to initiate and manage heparin infusion.   Baseline Labs: aPTT 35, PT 15.8, INR 1.2, Hgb 11.9, Hct 36.7, Plt 148   Goal of Therapy:  Heparin level 0.3-0.7 units/ml Monitor platelets by anticoagulation protocol: Yes  Plan: heparin level subtherapeutic Bolus 1400 units x 1 Increase heparin infusion to 1550 units/hr Recheck heparin level in 8 hours after rate change Continue to  monitor H&H and platelets daily while on heparin infusion   Otelia Sergeant, PharmD, [facility] 06/14/2023 12:55 AM

## 2023-06-15 DIAGNOSIS — I2699 Other pulmonary embolism without acute cor pulmonale: Secondary | ICD-10-CM | POA: Diagnosis not present

## 2023-06-15 LAB — CBC
MCHC: 33.6 g/dL (ref 30.0–36.0)
MCV: 93.4 fL (ref 80.0–100.0)

## 2023-06-15 LAB — GLUCOSE, CAPILLARY: Glucose-Capillary: 136 mg/dL — ABNORMAL HIGH (ref 70–99)

## 2023-06-15 NOTE — Progress Notes (Signed)
[facility]  ROUNDING NOTE   Subjective:   Sean [name]. Is a 82 y.o. male with past medical history of CHF, diabetes, CAD s/p CABG, hypertension and chornic kidney disease stage IIIb. Patient presents to the ED at the advise of nephrologist due to right leg edema. Patient has been admitted for Acute pulmonary embolism (HCC) [I26.99] DVT, lower extremity, proximal, acute, right (HCC) [I82.4Y1]  Patient is known to our practice from outpatient follow-up of CKD.  Patient seen resting quietly, easily aroused Alert and oriented Wife and daughter at bedside. Remains on room air Lower remedy edema has improved significantly.  Serum creatinine today has improved to 2.78 from 3.51 yesterday. Urine output of 4000 cc.   Objective:  Vital signs in last 24 hours:  Temp:  [97.4 F (36.3 C)-99.4 F (37.4 C)] 99.1 F (37.3 C) (07/26 0736) Pulse Rate:  [75-94] 75 (07/26 0736) Resp:  [18-24] 20 (07/26 0736) BP: (129-162)/(64-78) 129/64 (07/26 0736) SpO2:  [95 %-100 %] 98 % (07/26 0736)  Weight change:  Filed Weights   06/12/23 1336 06/13/23 1458  Weight: 108 kg 108 kg    Intake/Output: I/O last 3 completed shifts: In: 4890.5 [P.O.:1320; I.V.:3570.5] Out: 5675 [Urine:5675]   Intake/Output this shift:  Total I/O In: 400 [P.O.:400] Out: -   Physical Exam: General: NAD  Head: Normocephalic, atraumatic. Moist oral mucosal membranes  Eyes: Anicteric  Lungs:  Clear to auscultation  Heart: Regular rate and rhythm  Abdomen:  Soft, nontender, distended  Extremities: R leg peripheral edema more significant than left.  Neurologic: Alert and oriented, moving all four extremities  Skin: No lesions  Access: None  GU - Foley  Basic Metabolic Panel: Recent Labs  Lab 06/12/23 1414 06/13/23 0442 06/14/23 0557 06/14/23 1456 06/15/23 0611  NA 134* 135 138  --  137  K 5.1 4.8 5.3* 4.8 4.9  CL 102 106 108  --  107  CO2 22 23 21*  --  21*  GLUCOSE 133* 134* 121*  --   134*  BUN 82* 84* 62*  --  49*  CREATININE 4.75* 4.53* 3.51*  --  2.78*  CALCIUM 9.6 8.8* 8.3*  --  7.5*    Liver Function Tests: Recent Labs  Lab 06/12/23 1414  AST 13*  ALT 9  ALKPHOS 44  BILITOT 1.4*  PROT 7.6  ALBUMIN 4.5   No results for input(s): "LIPASE", "AMYLASE" in the last 168 hours. No results for input(s): "AMMONIA" in the last 168 hours.  CBC: Recent Labs  Lab 06/12/23 1414 06/13/23 0442 06/14/23 0557 06/15/23 0611  WBC 14.3* 12.0* 14.0* 12.1*  NEUTROABS 11.2*  --   --   --   HGB 11.9* 10.5* 10.0* 9.1*  HCT 36.7* 31.0* 29.7* 27.1*  MCV 95.6 92.8 93.1 93.4  PLT 148* 148* 150 153    Cardiac Enzymes: Recent Labs  Lab 06/13/23 0442  CKTOTAL 43*    BNP: Invalid input(s): "POCBNP"  CBG: No results for input(s): "GLUCAP" in the last 168 hours.  Microbiology: Results for orders placed or performed during the hospital encounter of 01/12/21  SARS CORONAVIRUS 2 (TAT 6-24 HRS) Nasopharyngeal Nasopharyngeal Swab     Status: None   Collection Time: 01/12/21 12:14 PM   Specimen: Nasopharyngeal Swab  Result Value Ref Range Status   SARS Coronavirus 2 NEGATIVE NEGATIVE Final    Comment: (NOTE) SARS-CoV-2 target nucleic acids are NOT DETECTED.  The SARS-CoV-2 RNA is generally detectable in upper and lower respiratory specimens  during the acute phase of infection. Negative results do not preclude SARS-CoV-2 infection, do not rule out co-infections with other pathogens, and should not be used as the sole basis for treatment or other patient management decisions. Negative results must be combined with clinical observations, patient history, and epidemiological information. The expected result is Negative.  Fact Sheet for Patients: HairSlick.no  Fact Sheet for Healthcare Providers: quierodirigir.com  This test is not yet approved or cleared by the [place] FDA and  has been authorized for  detection and/or diagnosis of SARS-CoV-2 by FDA under an Emergency Use Authorization (EUA). This EUA will remain  in effect (meaning this test can be used) for the duration of the COVID-19 declaration under Se ction 564(b)(1) of the Act, 21 U.S.C. section 360bbb-3(b)(1), unless the authorization is terminated or revoked sooner.  Performed at [facility], [address], [place], [place] [address]     Coagulation Studies: Recent Labs    06/12/23 1414  LABPROT 15.8*  INR 1.2    Urinalysis: Recent Labs    06/14/23 0039  COLORURINE YELLOW*  LABSPEC 1.010  PHURINE 5.0  GLUCOSEU NEGATIVE  HGBUR LARGE*  BILIRUBINUR NEGATIVE  KETONESUR NEGATIVE  PROTEINUR 30*  NITRITE NEGATIVE  LEUKOCYTESUR LARGE*      Imaging: ECHOCARDIOGRAM COMPLETE  Result Date: 06/14/2023    ECHOCARDIOGRAM REPORT   Patient Name:   Sean [name]. Date of Exam: 06/14/2023 Medical Rec #:  [id number]         Height:       70.0 in Accession #:    [id number]        Weight:       238.1 lb Date of Birth:  1941-11-07         BSA:          2.248 m Patient Age:    82 years          BP:           138/81 mmHg Patient Gender: M                 HR:           74 bpm. Exam Location:  [facility] Procedure: 2D Echo, Cardiac Doppler and Color Doppler Indications:     Pulmonary embolus I26.09  History:         Patient has prior history of Echocardiogram examinations, most                  recent 10/14/2019. CHF, Pacemaker; Risk Factors:Hypertension.  Sonographer:     Cristela Blue Referring Phys:  [id number] Charise Killian Diagnosing Phys: Julien Nordmann MD IMPRESSIONS  1. Left ventricular ejection fraction, by estimation, is 45 to 50%. The left ventricle has mildly decreased function. The left ventricle demonstrates regional wall motion abnormalities (hypokinesis of the anterior, anteroseptal and apical region). Left ventricular diastolic parameters are consistent with Grade I diastolic dysfunction (impaired relaxation).  2. Right  ventricular systolic function is normal. The right ventricular size is normal. There is moderately elevated pulmonary artery systolic pressure. The estimated right ventricular systolic pressure is 54.0 mmHg.  3. Right atrial size was mildly dilated.  4. The mitral valve is normal in structure. No evidence of mitral valve regurgitation. No evidence of mitral stenosis.  5. The aortic valve has an indeterminant number of cusps. Aortic valve regurgitation is not visualized. Aortic valve sclerosis is present, with no evidence of aortic valve stenosis.  6. The inferior vena  cava is normal in size with greater than 50% respiratory variability, suggesting right atrial pressure of 3 mmHg. FINDINGS  Left Ventricle: Left ventricular ejection fraction, by estimation, is 40 to 45%. Left ventricular ejection fraction by PLAX is 39 %. The left ventricle has mildly decreased function. The left ventricle demonstrates regional wall motion abnormalities. The left ventricular internal cavity size was normal in size. There is mild left ventricular hypertrophy. Left ventricular diastolic parameters are consistent with Grade I diastolic dysfunction (impaired relaxation). Right Ventricle: The right ventricular size is normal. No increase in right ventricular wall thickness. Right ventricular systolic function is normal. There is moderately elevated pulmonary artery systolic pressure. The tricuspid regurgitant velocity is 3.50 m/s, and with an assumed right atrial pressure of 5 mmHg, the estimated right ventricular systolic pressure is 54.0 mmHg. Left Atrium: Left atrial size was normal in size. Right Atrium: Right atrial size was mildly dilated. Pericardium: There is no evidence of pericardial effusion. Mitral Valve: The mitral valve is normal in structure. There is mild calcification of the mitral valve leaflet(s). Mild mitral annular calcification. No evidence of mitral valve regurgitation. No evidence of mitral valve stenosis. Tricuspid  Valve: The tricuspid valve is normal in structure. Tricuspid valve regurgitation is mild . No evidence of tricuspid stenosis. Aortic Valve: The aortic valve has an indeterminant number of cusps. Aortic valve regurgitation is not visualized. Aortic valve sclerosis is present, with no evidence of aortic valve stenosis. Aortic valve mean gradient measures 3.5 mmHg. Aortic valve peak gradient measures 6.6 mmHg. Aortic valve area, by VTI measures 2.38 cm. Pulmonic Valve: The pulmonic valve was normal in structure. Pulmonic valve regurgitation is trivial. No evidence of pulmonic stenosis. Aorta: The aortic root is normal in size and structure. Venous: The inferior vena cava is normal in size with greater than 50% respiratory variability, suggesting right atrial pressure of 3 mmHg. IAS/Shunts: No atrial level shunt detected by color flow Doppler. Additional Comments: A device lead is visualized.  LEFT VENTRICLE PLAX 2D LV EF:         Left            Diastology                ventricular     LV e' medial:    6.09 cm/s                ejection        LV E/e' medial:  9.9                fraction by     LV e' lateral:   10.70 cm/s                PLAX is 39      LV E/e' lateral: 5.6                %. LVIDd:         4.70 cm LVIDs:         3.80 cm LV PW:         1.40 cm LV IVS:        1.10 cm LVOT diam:     2.00 cm LV SV:         45 LV SV Index:   20 LVOT Area:     3.14 cm  LV Volumes (MOD) LV vol d, MOD    79.7 ml A2C: LV vol d, MOD    101.0 ml A4C: LV vol s, MOD  55.9 ml A2C: LV vol s, MOD    58.1 ml A4C: LV SV MOD A2C:   23.8 ml LV SV MOD A4C:   101.0 ml LV SV MOD BP:    34.0 ml RIGHT VENTRICLE RV Basal diam:  4.50 cm RV Mid diam:    3.50 cm LEFT ATRIUM             Index        RIGHT ATRIUM           Index LA diam:        3.10 cm 1.38 cm/m   RA Area:     25.10 cm LA Vol (A2C):   36.2 ml 16.10 ml/m  RA Volume:   81.20 ml  36.12 ml/m LA Vol (A4C):   27.8 ml 12.37 ml/m LA Biplane Vol: 32.6 ml 14.50 ml/m  AORTIC VALVE AV  Area (Vmax):    1.88 cm AV Area (Vmean):   1.96 cm AV Area (VTI):     2.38 cm AV Vmax:           128.00 cm/s AV Vmean:          86.600 cm/s AV VTI:            0.189 m AV Peak Grad:      6.6 mmHg AV Mean Grad:      3.5 mmHg LVOT Vmax:         76.60 cm/s LVOT Vmean:        54.000 cm/s LVOT VTI:          0.143 m LVOT/AV VTI ratio: 0.76  AORTA Ao Root diam: 3.50 cm MITRAL VALVE               TRICUSPID VALVE MV Area (PHT): 3.00 cm    TR Peak grad:   49.0 mmHg MV Decel Time: 253 msec    TR Vmax:        350.00 cm/s MV E velocity: 60.30 cm/s MV A velocity: 79.10 cm/s  SHUNTS MV E/A ratio:  0.76        Systemic VTI:  0.14 m                            Systemic Diam: 2.00 cm Julien Nordmann MD Electronically signed by Julien Nordmann MD Signature Date/Time: 06/14/2023/1:14:26 PM    Final    PERIPHERAL VASCULAR CATHETERIZATION  Result Date: 06/14/2023 See surgical note for result.    Medications:    sodium chloride 100 mL/hr at 06/15/23 5784    allopurinol  100 mg Oral QODAY   apixaban  10 mg Oral BID   Followed by   Melene Muller ON 06/21/2023] apixaban  5 mg Oral BID   aspirin EC  81 mg Oral Daily   atorvastatin  40 mg Oral Daily   Chlorhexidine Gluconate Cloth  6 each Topical Daily   loratadine  10 mg Oral Daily   metoprolol succinate  25 mg Oral Daily   multivitamin with minerals  1 tablet Oral Daily   tamsulosin  0.4 mg Oral Daily   acetaminophen **OR** acetaminophen, magnesium hydroxide, ondansetron **OR** [DISCONTINUED] ondansetron (ZOFRAN) IV, traZODone  Assessment/ Plan:  Mr. Sean [name]. is a 82 y.o.  male with past medical history of CHF, diabetes, CAD s/p CABG, hypertension and chornic kidney disease stage IIIb. Patient presents to the ED at the advise of nephrologist due to right leg edema. Patient has been  admitted for Acute pulmonary embolism (HCC) [I26.99] DVT, lower extremity, proximal, acute, right (HCC) [I82.4Y1]   Acute Kidney Injury on chronic kidney disease stage IIIb with  baseline creatinine 1.8 and GFR of 38 on 12/13/22.  Acute kidney injury likely secondary to hemodynamic instability due to recent acute PE/DVT and status post fall Chronic kidney disease is secondary to diabetes and hypertension. Creatinine elevated on admission, 4.75.  Creatinine continues to improve with IV fluids as noted below  Adequate urine output noted greater than 4 L.  UA appears hazy with hematuria and leukocytes.  Lab Results  Component Value Date   CREATININE 2.78 (H) 06/15/2023   CREATININE 3.51 (H) 06/14/2023   CREATININE 4.53 (H) 06/13/2023    Intake/Output Summary (Last 24 hours) at 06/15/2023 1230 Last data filed at 06/15/2023 1043 Gross per 24 hour  Intake 3583.91 ml  Output 2375 ml  Net 1208.91 ml   Remove Foley, voiding trial. Patient feels like he will be ready to discharge on Saturday.  2. Anemia of chronic kidney disease Lab Results  Component Value Date   HGB 9.1 (L) 06/15/2023    Hgb at goal  3. Hypertension with chronic kidney disease. Home regimen includes losartan, metoprolol and spironolactone. Only receiving metoprolol.  Blood pressure control is acceptable.  4. Acute pulmonary embolism with right lower extremity DVT. Seen on chest xray and NM perfusion study. Placed on Heparin drip.  Vascular surgery performed right lower extremity venogram with IVC placement and mechanical thrombectomy. Now on Eliquis 5 mg p.o. twice daily.   LOS: 3 Sean [name] 7/26/202412:30 PM

## 2023-06-15 NOTE — Plan of Care (Signed)

## 2023-06-15 NOTE — Care Management Important Message (Addendum)
Important Message  Patient Details  Name: Sean [name]. MRN: [id number] Date of Birth: [birth year]   Medicare Important Message Given:  Other (see comment)  Attempted to reach patient via room phone [phone]) to review Medicare IM. Unable to reach at this time.   Update 1:14pm: Attempted to reach patient once more via room phone.  No answer at this time.    Johnell Comings 06/15/2023, 11:42 AM

## 2023-06-15 NOTE — Progress Notes (Signed)
PROGRESS NOTE    Sean [name].  WUJ:[id number] DOB: 20-Aug-1941 DOA: 06/12/2023 PCP: Jerl Mina, MD   Assessment & Plan:   Principal Problem:   Acute pulmonary embolism (HCC) Active Problems:   DVT, lower extremity, proximal, acute, right (HCC)   Acute kidney injury superimposed on chronic kidney disease (HCC)   Essential hypertension   Dyslipidemia   BPH (benign prostatic hyperplasia)   Gout  Assessment and Plan: Acute pulmonary embolism: continue on eliquis x 1 year as per vasc surg. Echo showed EF 45-50%, grade I diastolic dysfunction, LV demonstrates regional wall motion abnormalities. No intervention planned for PE as per vasc surg. Not currently requiring supplemental oxygen   Right LE DVT: extensive. Continue on eliquis x 1 year as per vasc surg. S/p mechanical thrombectomy & IVC filter placement 06/13/23 as per vasc surg. Will need to f/u outpatient w/ vasc surg in 1 month w/ b/l LE [place]    AKI on CKDIIIb: Cr is trending down daily. Avoid nephrotoxic meds. Nephro following and recs apprec   Hyperkalemia: resolve   HTN: continue on metoprolol. Holding losartan, aldactone   HLD: continue on statin   BPH: continue on flomax   Urine retention: d/c foley and start voiding trial   Gout: continue on allopurinol       DVT prophylaxis: eliquis  Code Status: DNR Family Communication: discussed pt's care w/ pt's care w/ pt's family at bedside and answered their questions  Disposition Plan: depends on PT/OT recs  Status is: Inpatient Remains inpatient appropriate because: severity of illness    Level of care: Progressive Consultants:  Vasc surg  Nephro   Procedures:   Antimicrobials:    Subjective: Pt c/o fatigue  Objective: Vitals:   06/14/23 2030 06/14/23 2303 06/15/23 0336 06/15/23 0736  BP: (!) 148/76 (!) 162/71 (!) 152/78 129/64  Pulse: 94 87 88 75  Resp: 18 (!) 22 20 20   Temp: 98.9 F (37.2 C) 98.7 F (37.1 C) 99.4 F (37.4 C) 99.1 F  (37.3 C)  TempSrc:  Oral Oral   SpO2: 95% 96% 97% 98%  Weight:      Height:        Intake/Output Summary (Last 24 hours) at 06/15/2023 0906 Last data filed at 06/15/2023 0645 Gross per 24 hour  Intake 3663.91 ml  Output 4050 ml  Net -386.09 ml   Filed Weights   06/12/23 1336 06/13/23 1458  Weight: 108 kg 108 kg    Examination:  General exam: appears comfortable  Respiratory system: decreased breath sounds b/l  Cardiovascular system: S1/S2+. No rubs or clicks  Gastrointestinal system: Abd is soft, NT, obese & hypoactive bowel sounds  Central nervous system: alert & awake. Moves all extremities  Psychiatry: judgement and insight appears at baseline. Flat mood and affect     Data Reviewed: I have personally reviewed following labs and imaging studies  CBC: Recent Labs  Lab 06/12/23 1414 06/13/23 0442 06/14/23 0557 06/15/23 0611  WBC 14.3* 12.0* 14.0* 12.1*  NEUTROABS 11.2*  --   --   --   HGB 11.9* 10.5* 10.0* 9.1*  HCT 36.7* 31.0* 29.7* 27.1*  MCV 95.6 92.8 93.1 93.4  PLT 148* 148* 150 153   Basic Metabolic Panel: Recent Labs  Lab 06/12/23 1414 06/13/23 0442 06/14/23 0557 06/14/23 1456 06/15/23 0611  NA 134* 135 138  --  137  K 5.1 4.8 5.3* 4.8 4.9  CL 102 106 108  --  107  CO2 22 23 21*  --  21*  GLUCOSE 133* 134* 121*  --  134*  BUN 82* 84* 62*  --  49*  CREATININE 4.75* 4.53* 3.51*  --  2.78*  CALCIUM 9.6 8.8* 8.3*  --  7.5*   GFR: Estimated Creatinine Clearance: 25.6 mL/min (A) (by C-G formula based on SCr of 2.78 mg/dL (H)). Liver Function Tests: Recent Labs  Lab 06/12/23 1414  AST 13*  ALT 9  ALKPHOS 44  BILITOT 1.4*  PROT 7.6  ALBUMIN 4.5   No results for input(s): "LIPASE", "AMYLASE" in the last 168 hours. No results for input(s): "AMMONIA" in the last 168 hours. Coagulation Profile: Recent Labs  Lab 06/12/23 1414  INR 1.2   Cardiac Enzymes: Recent Labs  Lab 06/13/23 0442  CKTOTAL 43*   BNP (last 3 results) No results for  input(s): "PROBNP" in the last 8760 hours. HbA1C: No results for input(s): "HGBA1C" in the last 72 hours. CBG: No results for input(s): "GLUCAP" in the last 168 hours. Lipid Profile: No results for input(s): "CHOL", "HDL", "LDLCALC", "TRIG", "CHOLHDL", "LDLDIRECT" in the last 72 hours. Thyroid Function Tests: No results for input(s): "TSH", "T4TOTAL", "FREET4", "T3FREE", "THYROIDAB" in the last 72 hours. Anemia Panel: No results for input(s): "VITAMINB12", "FOLATE", "FERRITIN", "TIBC", "IRON", "RETICCTPCT" in the last 72 hours. Sepsis Labs: No results for input(s): "PROCALCITON", "LATICACIDVEN" in the last 168 hours.  No results found for this or any previous visit (from the past 240 hour(s)).       Radiology Studies: ECHOCARDIOGRAM COMPLETE  Result Date: 06/14/2023    ECHOCARDIOGRAM REPORT   Patient Name:   Sean [name]. Date of Exam: 06/14/2023 Medical Rec #:  [id number]         Height:       70.0 in Accession #:    [id number]        Weight:       238.1 lb Date of Birth:  [birth year]         BSA:          2.248 m Patient Age:    82 years          BP:           138/81 mmHg Patient Gender: M                 HR:           74 bpm. Exam Location:  [facility] Procedure: 2D Echo, Cardiac Doppler and Color Doppler Indications:     Pulmonary embolus I26.09  History:         Patient has prior history of Echocardiogram examinations, most                  recent 10/14/2019. CHF, Pacemaker; Risk Factors:Hypertension.  Sonographer:     Cristela Blue Referring Phys:  [id number] Charise Killian Diagnosing Phys: Julien Nordmann MD IMPRESSIONS  1. Left ventricular ejection fraction, by estimation, is 45 to 50%. The left ventricle has mildly decreased function. The left ventricle demonstrates regional wall motion abnormalities (hypokinesis of the anterior, anteroseptal and apical region). Left ventricular diastolic parameters are consistent with Grade I diastolic dysfunction (impaired relaxation).  2. Right ventricular  systolic function is normal. The right ventricular size is normal. There is moderately elevated pulmonary artery systolic pressure. The estimated right ventricular systolic pressure is 54.0 mmHg.  3. Right atrial size was mildly dilated.  4. The mitral valve is normal in structure. No evidence of mitral valve regurgitation. No evidence of mitral  stenosis.  5. The aortic valve has an indeterminant number of cusps. Aortic valve regurgitation is not visualized. Aortic valve sclerosis is present, with no evidence of aortic valve stenosis.  6. The inferior vena cava is normal in size with greater than 50% respiratory variability, suggesting right atrial pressure of 3 mmHg. FINDINGS  Left Ventricle: Left ventricular ejection fraction, by estimation, is 40 to 45%. Left ventricular ejection fraction by PLAX is 39 %. The left ventricle has mildly decreased function. The left ventricle demonstrates regional wall motion abnormalities. The left ventricular internal cavity size was normal in size. There is mild left ventricular hypertrophy. Left ventricular diastolic parameters are consistent with Grade I diastolic dysfunction (impaired relaxation). Right Ventricle: The right ventricular size is normal. No increase in right ventricular wall thickness. Right ventricular systolic function is normal. There is moderately elevated pulmonary artery systolic pressure. The tricuspid regurgitant velocity is 3.50 m/s, and with an assumed right atrial pressure of 5 mmHg, the estimated right ventricular systolic pressure is 54.0 mmHg. Left Atrium: Left atrial size was normal in size. Right Atrium: Right atrial size was mildly dilated. Pericardium: There is no evidence of pericardial effusion. Mitral Valve: The mitral valve is normal in structure. There is mild calcification of the mitral valve leaflet(s). Mild mitral annular calcification. No evidence of mitral valve regurgitation. No evidence of mitral valve stenosis. Tricuspid Valve: The  tricuspid valve is normal in structure. Tricuspid valve regurgitation is mild . No evidence of tricuspid stenosis. Aortic Valve: The aortic valve has an indeterminant number of cusps. Aortic valve regurgitation is not visualized. Aortic valve sclerosis is present, with no evidence of aortic valve stenosis. Aortic valve mean gradient measures 3.5 mmHg. Aortic valve peak gradient measures 6.6 mmHg. Aortic valve area, by VTI measures 2.38 cm. Pulmonic Valve: The pulmonic valve was normal in structure. Pulmonic valve regurgitation is trivial. No evidence of pulmonic stenosis. Aorta: The aortic root is normal in size and structure. Venous: The inferior vena cava is normal in size with greater than 50% respiratory variability, suggesting right atrial pressure of 3 mmHg. IAS/Shunts: No atrial level shunt detected by color flow Doppler. Additional Comments: A device lead is visualized.  LEFT VENTRICLE PLAX 2D LV EF:         Left            Diastology                ventricular     LV e' medial:    6.09 cm/s                ejection        LV E/e' medial:  9.9                fraction by     LV e' lateral:   10.70 cm/s                PLAX is 39      LV E/e' lateral: 5.6                %. LVIDd:         4.70 cm LVIDs:         3.80 cm LV PW:         1.40 cm LV IVS:        1.10 cm LVOT diam:     2.00 cm LV SV:         45 LV SV Index:   20 LVOT  Area:     3.14 cm  LV Volumes (MOD) LV vol d, MOD    79.7 ml A2C: LV vol d, MOD    101.0 ml A4C: LV vol s, MOD    55.9 ml A2C: LV vol s, MOD    58.1 ml A4C: LV SV MOD A2C:   23.8 ml LV SV MOD A4C:   101.0 ml LV SV MOD BP:    34.0 ml RIGHT VENTRICLE RV Basal diam:  4.50 cm RV Mid diam:    3.50 cm LEFT ATRIUM             Index        RIGHT ATRIUM           Index LA diam:        3.10 cm 1.38 cm/m   RA Area:     25.10 cm LA Vol (A2C):   36.2 ml 16.10 ml/m  RA Volume:   81.20 ml  36.12 ml/m LA Vol (A4C):   27.8 ml 12.37 ml/m LA Biplane Vol: 32.6 ml 14.50 ml/m  AORTIC VALVE AV Area  (Vmax):    1.88 cm AV Area (Vmean):   1.96 cm AV Area (VTI):     2.38 cm AV Vmax:           128.00 cm/s AV Vmean:          86.600 cm/s AV VTI:            0.189 m AV Peak Grad:      6.6 mmHg AV Mean Grad:      3.5 mmHg LVOT Vmax:         76.60 cm/s LVOT Vmean:        54.000 cm/s LVOT VTI:          0.143 m LVOT/AV VTI ratio: 0.76  AORTA Ao Root diam: 3.50 cm MITRAL VALVE               TRICUSPID VALVE MV Area (PHT): 3.00 cm    TR Peak grad:   49.0 mmHg MV Decel Time: 253 msec    TR Vmax:        350.00 cm/s MV E velocity: 60.30 cm/s MV A velocity: 79.10 cm/s  SHUNTS MV E/A ratio:  0.76        Systemic VTI:  0.14 m                            Systemic Diam: 2.00 cm Julien Nordmann MD Electronically signed by Julien Nordmann MD Signature Date/Time: 06/14/2023/1:14:26 PM    Final    PERIPHERAL VASCULAR CATHETERIZATION  Result Date: 06/14/2023 See surgical note for result.       Scheduled Meds:  allopurinol  100 mg Oral QODAY   apixaban  10 mg Oral BID   Followed by   Melene Muller ON 06/21/2023] apixaban  5 mg Oral BID   aspirin EC  81 mg Oral Daily   atorvastatin  40 mg Oral Daily   Chlorhexidine Gluconate Cloth  6 each Topical Daily   loratadine  10 mg Oral Daily   metoprolol succinate  25 mg Oral Daily   multivitamin with minerals  1 tablet Oral Daily   tamsulosin  0.4 mg Oral Daily   Continuous Infusions:  sodium chloride 100 mL/hr at 06/15/23 0645     LOS: 3 days    Time spent: 25 mins     Charise Killian, MD  [facility] Pager [phone]  If 7PM-7AM, please contact night-coverage [email or web address] 06/15/2023, 9:06 AM

## 2023-06-16 DIAGNOSIS — I2699 Other pulmonary embolism without acute cor pulmonale: Secondary | ICD-10-CM | POA: Diagnosis not present

## 2023-06-16 MED ORDER — APIXABAN 5 MG PO TABS: ORAL_TABLET | ORAL | 0 refills | Status: DC

## 2023-06-16 NOTE — Discharge Summary (Signed)
Physician Discharge Summary  Sean [name]. VHQ:[id number] DOB: 1941-06-30 DOA: 06/12/2023  PCP: Jerl Mina, MD  Admit date: 06/12/2023 Discharge date: 06/16/2023  Admitted From: home  Disposition: home  Recommendations for Outpatient Follow-up:  Follow up with PCP in 1-2 weeks F/u w/ vasc surg, Dr. Wyn Quaker, in 1 month F/u w/ nephro, Dr. Suezanne Jacquet, in 1-2 weeks   Home Health: no  Equipment/Devices:  Discharge Condition: stable  CODE STATUS: DNR Diet recommendation: Heart Healthy   Brief/Interim Summary: HPI was taken from Dr. Arville Care: Sean [name]. is a 82 y.o. Caucasian male with medical history significant for coronary artery disease, asthma, osteoarthritis, chronic systolic CHF, stage III chronic kidney disease and DVT of the left upper extremity, history of complete heart block status post PPM and hypertension, who presented to the emergency room with acute onset of right leg pain and swelling for which the patient had an outpatient venous Doppler that showed right-sided extensive DVT.  The patient had heartburn last night that he thought reflux.  He denied any cough or dyspnea or wheezing or hemoptysis.  No chest pain or palpitations.  No bleeding diathesis.  No dysuria, oliguria or hematuria or flank pain.  No nausea or vomiting or diarrhea.  He has been having slightly diminished urine output.   ED Course: When he came to the ER, BP was 146/81 with otherwise normal vital signs.  Labs revealed mild hyponatremia 134 and potassium 5.1, BUN of 82 and creatinine 4.75 up from 34/1.74 on 01/12/2023.  Total bili was 1.4 and LFTs were within normal otherwise.  High sensitive troponin I was 3 and later the same.  CBC showed leukocytosis of 14.3 with neutrophilia and mild anemia and thrombocytopenia.  PT was 15.8 and INR 1.2 with PTT of 35.. EKG as reviewed by me : EKG showed sinus rhythm with a rate of 70 with intraventricular conduction delay and possibly developing left bundle branch  block. Imaging: 2 view chest x-ray showed his median sternotomy and left upper chest pacemaker with no consolidation or edema.  V/Q scan revealed high probability for PE.   The patient was started on IV heparin with bolus and drip.  Dr. Wyn Quaker was contacted about the patient.  He thought there was no need for chest CTA.  The patient be admitted to a progressive unit bed for further evaluation and management.   Discharge Diagnoses:  Principal Problem:   Acute pulmonary embolism (HCC) Active Problems:   DVT, lower extremity, proximal, acute, right (HCC)   Acute kidney injury superimposed on chronic kidney disease (HCC)   Essential hypertension   Dyslipidemia   BPH (benign prostatic hyperplasia)   Gout  Acute pulmonary embolism: continue on eliquis x 1 year as per vasc surg. Echo showed EF 45-50%, grade I diastolic dysfunction, LV demonstrates regional wall motion abnormalities. No intervention planned for PE as per vasc surg. Not currently requiring supplemental oxygen    Right LE DVT: extensive. Continue on eliquis x 1 year as per vasc surg. S/p mechanical thrombectomy & IVC filter placement 06/13/23 as per vasc surg. Will need to f/u outpatient w/ vasc surg in 1 month w/ b/l LE [place]     AKI on CKDIIIb: Cr is labile. Avoid nephrotoxic meds. Nephro following and recs apprec    Hyperkalemia: resolve    HTN: continue on metoprolol. Restart losartan, aldactone at d/c   HLD: continue on statin    BPH: continue on flomax    Urine retention: resolved  Gout: continue on allopurinol   Discharge Instructions  Discharge Instructions     Diet - low sodium heart healthy   Complete by: As directed    Discharge instructions   Complete by: As directed    F/u w/ PCP in 1-2 weeks. F/u w/ vasc surg, Dr. Wyn Quaker, in 1 month w/ b/l lower extremity ultrasound. F/u w/ nephro, Dr. Suezanne Jacquet, in 1-2 weeks   Increase activity slowly   Complete by: As directed       Allergies as of 06/16/2023        Reactions   Azithromycin Hives   Erythromycin Hives        Medication List     TAKE these medications    acetaminophen 500 MG tablet Commonly known as: TYLENOL Take 500 mg by mouth every 6 (six) hours as needed for moderate pain or headache.   allopurinol 100 MG tablet Commonly known as: ZYLOPRIM Take 100 mg by mouth every other day. At night   apixaban 5 MG Tabs tablet Commonly known as: ELIQUIS Take 2 tablets (10 mg total) by mouth 2 (two) times daily for 6 days, THEN 1 tablet (5 mg total) 2 (two) times daily. Start taking on: June 16, 2023   aspirin EC 81 MG tablet Take 1 tablet (81 mg total) by mouth daily. Swallow whole.   atorvastatin 40 MG tablet Commonly known as: LIPITOR TAKE ONE TABLET EVERY DAY   loratadine 10 MG tablet Commonly known as: Claritin Take 1 tablet (10 mg total) by mouth daily.   losartan 25 MG tablet Commonly known as: COZAAR TAKE ONE TABLET (25 MG) BY MOUTH EVERY DAY   metoprolol succinate 25 MG 24 hr tablet Commonly known as: TOPROL-XL TAKE 1 TABLET BY MOUTH DAILY   multivitamin with minerals Tabs tablet Take 1 tablet by mouth daily.   Primatene Mist 0.125 MG/ACT Aero Generic drug: EPINEPHrine Inhale 1 puff into the lungs daily as needed (shortness of breath).   PROSTATE PO Take 1 tablet by mouth at bedtime.   spironolactone 25 MG tablet Commonly known as: ALDACTONE Take 0.5 tablet (12.5 mg) by mouth once daily   tamsulosin 0.4 MG Caps capsule Commonly known as: FLOMAX Take 0.4 mg by mouth daily.        Allergies  Allergen Reactions   Azithromycin Hives   Erythromycin Hives    Consultations: Nephro  Vasc surg   Procedures/Studies: ECHOCARDIOGRAM COMPLETE  Result Date: 06/14/2023    ECHOCARDIOGRAM REPORT   Patient Name:   Sean [name]. Date of Exam: 06/14/2023 Medical Rec #:  [id number]         Height:       70.0 in Accession #:    [id number]        Weight:       238.1 lb Date of Birth:  [birth year]         BSA:           2.248 m Patient Age:    82 years          BP:           138/81 mmHg Patient Gender: M                 HR:           74 bpm. Exam Location:  [facility] Procedure: 2D Echo, Cardiac Doppler and Color Doppler Indications:     Pulmonary embolus I26.09  History:         Patient  has prior history of Echocardiogram examinations, most                  recent 10/14/2019. CHF, Pacemaker; Risk Factors:Hypertension.  Sonographer:     Cristela Blue Referring Phys:  [id number] Charise Killian Diagnosing Phys: Julien Nordmann MD IMPRESSIONS  1. Left ventricular ejection fraction, by estimation, is 45 to 50%. The left ventricle has mildly decreased function. The left ventricle demonstrates regional wall motion abnormalities (hypokinesis of the anterior, anteroseptal and apical region). Left ventricular diastolic parameters are consistent with Grade I diastolic dysfunction (impaired relaxation).  2. Right ventricular systolic function is normal. The right ventricular size is normal. There is moderately elevated pulmonary artery systolic pressure. The estimated right ventricular systolic pressure is 54.0 mmHg.  3. Right atrial size was mildly dilated.  4. The mitral valve is normal in structure. No evidence of mitral valve regurgitation. No evidence of mitral stenosis.  5. The aortic valve has an indeterminant number of cusps. Aortic valve regurgitation is not visualized. Aortic valve sclerosis is present, with no evidence of aortic valve stenosis.  6. The inferior vena cava is normal in size with greater than 50% respiratory variability, suggesting right atrial pressure of 3 mmHg. FINDINGS  Left Ventricle: Left ventricular ejection fraction, by estimation, is 40 to 45%. Left ventricular ejection fraction by PLAX is 39 %. The left ventricle has mildly decreased function. The left ventricle demonstrates regional wall motion abnormalities. The left ventricular internal cavity size was normal in size. There is mild left ventricular  hypertrophy. Left ventricular diastolic parameters are consistent with Grade I diastolic dysfunction (impaired relaxation). Right Ventricle: The right ventricular size is normal. No increase in right ventricular wall thickness. Right ventricular systolic function is normal. There is moderately elevated pulmonary artery systolic pressure. The tricuspid regurgitant velocity is 3.50 m/s, and with an assumed right atrial pressure of 5 mmHg, the estimated right ventricular systolic pressure is 54.0 mmHg. Left Atrium: Left atrial size was normal in size. Right Atrium: Right atrial size was mildly dilated. Pericardium: There is no evidence of pericardial effusion. Mitral Valve: The mitral valve is normal in structure. There is mild calcification of the mitral valve leaflet(s). Mild mitral annular calcification. No evidence of mitral valve regurgitation. No evidence of mitral valve stenosis. Tricuspid Valve: The tricuspid valve is normal in structure. Tricuspid valve regurgitation is mild . No evidence of tricuspid stenosis. Aortic Valve: The aortic valve has an indeterminant number of cusps. Aortic valve regurgitation is not visualized. Aortic valve sclerosis is present, with no evidence of aortic valve stenosis. Aortic valve mean gradient measures 3.5 mmHg. Aortic valve peak gradient measures 6.6 mmHg. Aortic valve area, by VTI measures 2.38 cm. Pulmonic Valve: The pulmonic valve was normal in structure. Pulmonic valve regurgitation is trivial. No evidence of pulmonic stenosis. Aorta: The aortic root is normal in size and structure. Venous: The inferior vena cava is normal in size with greater than 50% respiratory variability, suggesting right atrial pressure of 3 mmHg. IAS/Shunts: No atrial level shunt detected by color flow Doppler. Additional Comments: A device lead is visualized.  LEFT VENTRICLE PLAX 2D LV EF:         Left            Diastology                ventricular     LV e' medial:    6.09 cm/s  ejection        LV E/e' medial:  9.9                fraction by     LV e' lateral:   10.70 cm/s                PLAX is 39      LV E/e' lateral: 5.6                %. LVIDd:         4.70 cm LVIDs:         3.80 cm LV PW:         1.40 cm LV IVS:        1.10 cm LVOT diam:     2.00 cm LV SV:         45 LV SV Index:   20 LVOT Area:     3.14 cm  LV Volumes (MOD) LV vol d, MOD    79.7 ml A2C: LV vol d, MOD    101.0 ml A4C: LV vol s, MOD    55.9 ml A2C: LV vol s, MOD    58.1 ml A4C: LV SV MOD A2C:   23.8 ml LV SV MOD A4C:   101.0 ml LV SV MOD BP:    34.0 ml RIGHT VENTRICLE RV Basal diam:  4.50 cm RV Mid diam:    3.50 cm LEFT ATRIUM             Index        RIGHT ATRIUM           Index LA diam:        3.10 cm 1.38 cm/m   RA Area:     25.10 cm LA Vol (A2C):   36.2 ml 16.10 ml/m  RA Volume:   81.20 ml  36.12 ml/m LA Vol (A4C):   27.8 ml 12.37 ml/m LA Biplane Vol: 32.6 ml 14.50 ml/m  AORTIC VALVE AV Area (Vmax):    1.88 cm AV Area (Vmean):   1.96 cm AV Area (VTI):     2.38 cm AV Vmax:           128.00 cm/s AV Vmean:          86.600 cm/s AV VTI:            0.189 m AV Peak Grad:      6.6 mmHg AV Mean Grad:      3.5 mmHg LVOT Vmax:         76.60 cm/s LVOT Vmean:        54.000 cm/s LVOT VTI:          0.143 m LVOT/AV VTI ratio: 0.76  AORTA Ao Root diam: 3.50 cm MITRAL VALVE               TRICUSPID VALVE MV Area (PHT): 3.00 cm    TR Peak grad:   49.0 mmHg MV Decel Time: 253 msec    TR Vmax:        350.00 cm/s MV E velocity: 60.30 cm/s MV A velocity: 79.10 cm/s  SHUNTS MV E/A ratio:  0.76        Systemic VTI:  0.14 m                            Systemic Diam: 2.00 cm Julien Nordmann MD Electronically signed by Julien Nordmann MD Signature Date/Time: 06/14/2023/1:14:26  PM    Final    PERIPHERAL VASCULAR CATHETERIZATION  Result Date: 06/14/2023 See surgical note for result.  NM Pulmonary Perfusion  Result Date: 06/12/2023 CLINICAL DATA:  Pulmonary embolism. EXAM: NUCLEAR MEDICINE PERFUSION LUNG SCAN Two-view chest x-ray  TECHNIQUE: Perfusion images were obtained in multiple projections after intravenous injection of radiopharmaceutical. Ventilation scans intentionally deferred if perfusion scan and chest x-ray adequate for interpretation during COVID 19 epidemic. Standard two-view sitting chest x-ray obtained. RADIOPHARMACEUTICALS:  4.31 mCi Tc-42m MAA IV COMPARISON:  None Available. FINDINGS: Chest x-ray demonstrates status post median sternotomy. Left upper chest pacemaker with a additional leads. No consolidation, pneumothorax or effusion. No edema. On perfusion there are several large defects identified including left upper lobe, bilateral lower lobes peripherally. IMPRESSION: High probability perfusion scan. Critical Value/emergent results were called by telephone at the time of interpretation on 06/12/2023 at 1:59 pm to provider NEHA RAY ; Donna Bernard , who verbally acknowledged these results. Electronically Signed   By: Karen Kays M.D.   On: 06/12/2023 17:12   DG Chest 2 View  Result Date: 06/12/2023 CLINICAL DATA:  Pulmonary embolism. EXAM: NUCLEAR MEDICINE PERFUSION LUNG SCAN Two-view chest x-ray TECHNIQUE: Perfusion images were obtained in multiple projections after intravenous injection of radiopharmaceutical. Ventilation scans intentionally deferred if perfusion scan and chest x-ray adequate for interpretation during COVID 19 epidemic. Standard two-view sitting chest x-ray obtained. RADIOPHARMACEUTICALS:  4.31 mCi Tc-47m MAA IV COMPARISON:  None Available. FINDINGS: Chest x-ray demonstrates status post median sternotomy. Left upper chest pacemaker with a additional leads. No consolidation, pneumothorax or effusion. No edema. On perfusion there are several large defects identified including left upper lobe, bilateral lower lobes peripherally. IMPRESSION: High probability perfusion scan. Critical Value/emergent results were called by telephone at the time of interpretation on 06/12/2023 at 1:59 pm to provider NEHA RAY  ; Donna Bernard , who verbally acknowledged these results. Electronically Signed   By: Karen Kays M.D.   On: 06/12/2023 17:12   US Venous Img Lower Unilateral Right (DVT)  Result Date: 06/12/2023 CLINICAL DATA:  Right lower extremity pain and swelling since last Saturday EXAM: RIGHT LOWER EXTREMITY VENOUS DOPPLER ULTRASOUND TECHNIQUE: Gray-scale sonography with graded compression, as well as color Doppler and duplex ultrasound were performed to evaluate the lower extremity deep venous systems from the level of the common femoral vein and including the common femoral, femoral, profunda femoral, popliteal and calf veins including the posterior tibial, peroneal and gastrocnemius veins when visible. The superficial great saphenous vein was also interrogated. Spectral Doppler was utilized to evaluate flow at rest and with distal augmentation maneuvers in the common femoral, femoral and popliteal veins. COMPARISON:  None Available. FINDINGS: Contralateral Common Femoral Vein: Respiratory phasicity is normal and symmetric with the symptomatic side. No evidence of thrombus. Normal compressibility. Common Femoral Vein: No evidence of thrombus. Normal compressibility, respiratory phasicity and response to augmentation. Saphenofemoral Junction: No evidence of thrombus. Normal compressibility and flow on color Doppler imaging. Profunda Femoral Vein: No evidence of thrombus. Normal compressibility and flow on color Doppler imaging. Femoral Vein: Mixed echogenicity intraluminal thrombus. Vessel noncompressible. Thrombus appears occlusive. Popliteal Vein: Similar intraluminal mixed echogenicity thrombus appearing nearly occlusive. Vessel partially compressible. Calf Veins: Thrombus does appear to extend into the tibial and peroneal calf veins to some degree with partial compressibility. Superficial Great Saphenous Vein: Proximal GSV superficial thrombosis noted without occlusion. Vessel partially compressible. IMPRESSION:  Positive exam for right femoropopliteal DVT extending into the calf veins. Proximal right GSV superficial thrombosis as  well. Electronically Signed   By: Judie Petit.  Shick M.D.   On: 06/12/2023 13:05   (Echo, Carotid, EGD, Colonoscopy, ERCP)    Subjective: pt c/o fatigue    Discharge Exam: Vitals:   06/16/23 0346 06/16/23 0752  BP: (!) 158/91 (!) 150/73  Pulse: 99 91  Resp: 18 (!) 24  Temp: 98.6 F (37 C) 98.4 F (36.9 C)  SpO2: 96% 100%   Vitals:   06/15/23 2000 06/16/23 0000 06/16/23 0346 06/16/23 0752  BP:  (!) 157/77 (!) 158/91 (!) 150/73  Pulse:  88 99 91  Resp: 20 20 18  (!) 24  Temp:  99.5 F (37.5 C) 98.6 F (37 C) 98.4 F (36.9 C)  TempSrc:  Oral    SpO2:  99% 96% 100%  Weight:      Height:        General: Pt is alert, awake, not in acute distress Cardiovascular: S1/S2 +, no rubs, no gallops Respiratory: CTA bilaterally, no wheezing, no rhonchi Abdominal: Soft, NT, obese, bowel sounds + Extremities: no cyanosis    The results of significant diagnostics from this hospitalization (including imaging, microbiology, ancillary and laboratory) are listed below for reference.     Microbiology: No results found for this or any previous visit (from the past 240 hour(s)).   Labs: BNP (last 3 results) No results for input(s): "BNP" in the last 8760 hours. Basic Metabolic Panel: Recent Labs  Lab 06/12/23 1414 06/13/23 0442 06/14/23 0557 06/14/23 1456 06/15/23 0611 06/16/23 0542  NA 134* 135 138  --  137 135  K 5.1 4.8 5.3* 4.8 4.9 4.9  CL 102 106 108  --  107 108  CO2 22 23 21*  --  21* 19*  GLUCOSE 133* 134* 121*  --  134* 146*  BUN 82* 84* 62*  --  49* 42*  CREATININE 4.75* 4.53* 3.51*  --  2.78* 2.86*  CALCIUM 9.6 8.8* 8.3*  --  7.5* 7.4*   Liver Function Tests: Recent Labs  Lab 06/12/23 1414  AST 13*  ALT 9  ALKPHOS 44  BILITOT 1.4*  PROT 7.6  ALBUMIN 4.5   No results for input(s): "LIPASE", "AMYLASE" in the last 168 hours. No results for  input(s): "AMMONIA" in the last 168 hours. CBC: Recent Labs  Lab 06/12/23 1414 06/13/23 0442 06/14/23 0557 06/15/23 0611 06/16/23 0542  WBC 14.3* 12.0* 14.0* 12.1* 15.5*  NEUTROABS 11.2*  --   --   --   --   HGB 11.9* 10.5* 10.0* 9.1* 9.2*  HCT 36.7* 31.0* 29.7* 27.1* 27.7*  MCV 95.6 92.8 93.1 93.4 95.2  PLT 148* 148* 150 153 183   Cardiac Enzymes: Recent Labs  Lab 06/13/23 0442  CKTOTAL 43*   BNP: Invalid input(s): "POCBNP" CBG: Recent Labs  Lab 06/15/23 1239  GLUCAP 136*   D-Dimer No results for input(s): "DDIMER" in the last 72 hours. Hgb A1c No results for input(s): "HGBA1C" in the last 72 hours. Lipid Profile No results for input(s): "CHOL", "HDL", "LDLCALC", "TRIG", "CHOLHDL", "LDLDIRECT" in the last 72 hours. Thyroid function studies No results for input(s): "TSH", "T4TOTAL", "T3FREE", "THYROIDAB" in the last 72 hours.  Invalid input(s): "FREET3" Anemia work up No results for input(s): "VITAMINB12", "FOLATE", "FERRITIN", "TIBC", "IRON", "RETICCTPCT" in the last 72 hours. Urinalysis    Component Value Date/Time   COLORURINE YELLOW (A) 06/14/2023 0039   APPEARANCEUR HAZY (A) 06/14/2023 0039   APPEARANCEUR Clear 08/13/2014 2148   LABSPEC 1.010 06/14/2023 0039   LABSPEC 1.013 08/13/2014  2148   PHURINE 5.0 06/14/2023 0039   GLUCOSEU NEGATIVE 06/14/2023 0039   GLUCOSEU Negative 08/13/2014 2148   HGBUR LARGE (A) 06/14/2023 0039   BILIRUBINUR NEGATIVE 06/14/2023 0039   BILIRUBINUR Negative 08/13/2014 2148   KETONESUR NEGATIVE 06/14/2023 0039   PROTEINUR 30 (A) 06/14/2023 0039   NITRITE NEGATIVE 06/14/2023 0039   LEUKOCYTESUR LARGE (A) 06/14/2023 0039   LEUKOCYTESUR Negative 08/13/2014 2148   Sepsis Labs Recent Labs  Lab 06/13/23 0442 06/14/23 0557 06/15/23 0611 06/16/23 0542  WBC 12.0* 14.0* 12.1* 15.5*   Microbiology No results found for this or any previous visit (from the past 240 hour(s)).   Time coordinating discharge: Over 30  minutes  SIGNED:   Charise Killian, MD  [facility] 06/16/2023, 12:16 PM Pager   If 7PM-7AM, please contact night-coverage [email or web address]

## 2023-06-16 NOTE — TOC Transition Note (Addendum)
Transition of Care [facility]) - CM/SW Discharge Note   Patient Details  Name: Sean [name]. MRN: [id number] Date of Birth: 1941-06-02  [facility] CM/SW Contact:  Bing Quarry, RN Phone Number: 06/16/2023, 12:21 PM   Clinical Narrative: 7/27: DC today per provider with no needs identified. No TOC consult. Admitted with extensive RLE DVT. DC on Eliquis, and was consulted by [facility] on 06/13/23 for Eliquis eligible benefit with $47.00 copay for Elquis 5 mg per HTA noted by [facility] progress note.  Provider follow up recommendations per DC Summary.  Follow up with PCP in 1-2 weeks F/u w/ vasc surg, Dr. Wyn Quaker, in 1 month F/u w/ nephro, Dr. Suezanne Jacquet, in 1-2 weeks.  PCP: DR Raj Janus MSN RN CM  Transitions of Care Department [facility] [phone] Weekends Only    Final next level of care: Home/Self Care     Patient Goals and CMS Choice      Discharge Placement                         Discharge Plan and Services Additional resources added to the After Visit Summary for                                       Social Determinants of Health (SDOH) Interventions SDOH Screenings   Food Insecurity: No Food Insecurity (06/13/2023)  Housing: Low Risk  (06/13/2023)  Transportation Needs: No Transportation Needs (06/13/2023)  Utilities: Not At Risk (06/13/2023)  Depression (PHQ2-9): Low Risk  (09/13/2019)  Financial Resource Strain: Low Risk  (11/09/2022)   Received from [facility], [facility]  Tobacco Use: Low Risk  (06/13/2023)     Readmission Risk Interventions     No data to display

## 2023-06-16 NOTE — Progress Notes (Signed)
[facility]  PROGRESS NOTE   Subjective:   Patient seen at bedside.  Feels much better.  Entire family is at bedside.  Objective:  Vital signs: Blood pressure (!) 150/73, pulse 91, temperature 98.4 F (36.9 C), resp. rate (!) 24, height 5\' 10"  (1.778 m), weight 108 kg, SpO2 100%.  Intake/Output Summary (Last 24 hours) at 06/16/2023 1138 Last data filed at 06/16/2023 0857 Gross per 24 hour  Intake 2650.82 ml  Output 1675 ml  Net 975.82 ml   Filed Weights   06/12/23 1336 06/13/23 1458  Weight: 108 kg 108 kg     Physical Exam: General:  No acute distress  Head:  Normocephalic, atraumatic. Moist oral mucosal membranes  Eyes:  Anicteric  Neck:  Supple  Lungs:   Clear to auscultation, normal effort  Heart:  S1S2 no rubs  Abdomen:   Soft, nontender, bowel sounds present  Extremities: Right lower extremity peripheral edema.  Neurologic:  Awake, alert, following commands  Skin:  No lesions  Access:     Basic Metabolic Panel: Recent Labs  Lab 06/12/23 1414 06/13/23 0442 06/14/23 0557 06/14/23 1456 06/15/23 0611 06/16/23 0542  NA 134* 135 138  --  137 135  K 5.1 4.8 5.3* 4.8 4.9 4.9  CL 102 106 108  --  107 108  CO2 22 23 21*  --  21* 19*  GLUCOSE 133* 134* 121*  --  134* 146*  BUN 82* 84* 62*  --  49* 42*  CREATININE 4.75* 4.53* 3.51*  --  2.78* 2.86*  CALCIUM 9.6 8.8* 8.3*  --  7.5* 7.4*   GFR: Estimated Creatinine Clearance: 24.9 mL/min (A) (by C-G formula based on SCr of 2.86 mg/dL (H)).  Liver Function Tests: Recent Labs  Lab 06/12/23 1414  AST 13*  ALT 9  ALKPHOS 44  BILITOT 1.4*  PROT 7.6  ALBUMIN 4.5   No results for input(s): "LIPASE", "AMYLASE" in the last 168 hours. No results for input(s): "AMMONIA" in the last 168 hours.  CBC: Recent Labs  Lab 06/12/23 1414 06/13/23 0442 06/14/23 0557 06/15/23 0611 06/16/23 0542  WBC 14.3* 12.0* 14.0* 12.1* 15.5*  NEUTROABS 11.2*  --   --   --   --   HGB 11.9* 10.5* 10.0* 9.1* 9.2*   HCT 36.7* 31.0* 29.7* 27.1* 27.7*  MCV 95.6 92.8 93.1 93.4 95.2  PLT 148* 148* 150 153 183     HbA1C: Hgb A1c MFr Bld  Date/Time Value Ref Range Status  08/22/2018 11:40 AM 6.0 (H) 4.8 - 5.6 % Final    Comment:    (NOTE)         Prediabetes: 5.7 - 6.4         Diabetes: >6.4         Glycemic control for adults with diabetes: <7.0   06/26/2018 06:05 AM 6.0 (H) 4.8 - 5.6 % Final    Comment:    (NOTE) Pre diabetes:          5.7%-6.4% Diabetes:              >6.4% Glycemic control for   <7.0% adults with diabetes     Urinalysis: Recent Labs    06/14/23 0039  COLORURINE YELLOW*  LABSPEC 1.010  PHURINE 5.0  GLUCOSEU NEGATIVE  HGBUR LARGE*  BILIRUBINUR NEGATIVE  KETONESUR NEGATIVE  PROTEINUR 30*  NITRITE NEGATIVE  LEUKOCYTESUR LARGE*      Imaging: No results found.   Medications:    sodium chloride 100  mL/hr at 06/16/23 0458    allopurinol  100 mg Oral QODAY   apixaban  10 mg Oral BID   Followed by   Melene Muller ON 06/21/2023] apixaban  5 mg Oral BID   aspirin EC  81 mg Oral Daily   atorvastatin  40 mg Oral Daily   Chlorhexidine Gluconate Cloth  6 each Topical Daily   loratadine  10 mg Oral Daily   metoprolol succinate  25 mg Oral Daily   multivitamin with minerals  1 tablet Oral Daily   tamsulosin  0.4 mg Oral Daily    Assessment/ Plan:     82 y.o. male with past medical history of CHF, diabetes, CAD s/p CABG, hypertension and chornic kidney disease stage IIIb. Patient presents to the ED due to right leg edema. Patient has been admitted for Acute pulmonary embolism (HCC) [I26.99] DVT, lower extremity, proximal, acute, right (HCC) [I82.4Y1]  #1: Acute kidney injury on chronic kidney disease: Patient has a baseline creatinine of 1.8 which on admission was found to be 4.75.  With IV fluid resuscitation the creatinine has improved to 2.8 today.  Will continue gentle hydration for now.  #2: Pulmonary embolism/DVT right lower extremity: S/p IVC filter placement.   Patient is now on Eliquis and will continue the same.  #3: Anemia: Anemia most likely secondary to chronic kidney disease.  Patient is oral iron supplementation.  #4: Hypertension/congestive heart failure: Presently on metoprolol.  Will start him on oral torsemide on as-needed basis for now.  #5: BPH: Will continue tamsulosin.  Spoke to the entire family at bedside in detail.  No objection to discharge from renal standpoint.    LOS: 4 Lorain Childes, MD [facility] 7/27/202411:38 AM

## 2023-07-02 DIAGNOSIS — I824Y1 Acute embolism and thrombosis of unspecified deep veins of right proximal lower extremity: Secondary | ICD-10-CM | POA: Diagnosis not present

## 2023-07-02 DIAGNOSIS — R42 Dizziness and giddiness: Secondary | ICD-10-CM | POA: Diagnosis not present

## 2023-07-02 DIAGNOSIS — N1832 Chronic kidney disease, stage 3b: Secondary | ICD-10-CM | POA: Diagnosis not present

## 2023-07-02 DIAGNOSIS — I2699 Other pulmonary embolism without acute cor pulmonale: Secondary | ICD-10-CM | POA: Diagnosis not present

## 2023-07-02 DIAGNOSIS — I10 Essential (primary) hypertension: Secondary | ICD-10-CM | POA: Diagnosis not present

## 2023-07-02 DIAGNOSIS — E1122 Type 2 diabetes mellitus with diabetic chronic kidney disease: Secondary | ICD-10-CM | POA: Diagnosis not present

## 2023-07-16 ENCOUNTER — Other Ambulatory Visit: Payer: Self-pay | Admitting: Internal Medicine

## 2023-07-16 DIAGNOSIS — Z0000 Encounter for general adult medical examination without abnormal findings: Secondary | ICD-10-CM | POA: Diagnosis not present

## 2023-07-16 DIAGNOSIS — E1159 Type 2 diabetes mellitus with other circulatory complications: Secondary | ICD-10-CM | POA: Diagnosis not present

## 2023-07-16 DIAGNOSIS — N184 Chronic kidney disease, stage 4 (severe): Secondary | ICD-10-CM | POA: Diagnosis not present

## 2023-07-16 DIAGNOSIS — I10 Essential (primary) hypertension: Secondary | ICD-10-CM | POA: Diagnosis not present

## 2023-07-16 DIAGNOSIS — I2510 Atherosclerotic heart disease of native coronary artery without angina pectoris: Secondary | ICD-10-CM | POA: Diagnosis not present

## 2023-07-18 DIAGNOSIS — E875 Hyperkalemia: Secondary | ICD-10-CM | POA: Diagnosis not present

## 2023-07-20 ENCOUNTER — Encounter (INDEPENDENT_AMBULATORY_CARE_PROVIDER_SITE_OTHER): Payer: Self-pay | Admitting: Nurse Practitioner

## 2023-07-20 ENCOUNTER — Ambulatory Visit (INDEPENDENT_AMBULATORY_CARE_PROVIDER_SITE_OTHER): Payer: HMO | Admitting: Nurse Practitioner

## 2023-07-20 VITALS — BP 95/57 | HR 69 | Resp 16

## 2023-07-20 DIAGNOSIS — I10 Essential (primary) hypertension: Secondary | ICD-10-CM

## 2023-07-20 DIAGNOSIS — I2699 Other pulmonary embolism without acute cor pulmonale: Secondary | ICD-10-CM | POA: Diagnosis not present

## 2023-07-20 DIAGNOSIS — I824Y1 Acute embolism and thrombosis of unspecified deep veins of right proximal lower extremity: Secondary | ICD-10-CM | POA: Diagnosis not present

## 2023-07-24 ENCOUNTER — Encounter (INDEPENDENT_AMBULATORY_CARE_PROVIDER_SITE_OTHER): Payer: Self-pay | Admitting: Nurse Practitioner

## 2023-07-24 DIAGNOSIS — N1832 Chronic kidney disease, stage 3b: Secondary | ICD-10-CM | POA: Diagnosis not present

## 2023-07-24 DIAGNOSIS — I10 Essential (primary) hypertension: Secondary | ICD-10-CM | POA: Diagnosis not present

## 2023-07-24 DIAGNOSIS — N170 Acute kidney failure with tubular necrosis: Secondary | ICD-10-CM | POA: Diagnosis not present

## 2023-07-24 DIAGNOSIS — E875 Hyperkalemia: Secondary | ICD-10-CM | POA: Diagnosis not present

## 2023-07-24 DIAGNOSIS — N2581 Secondary hyperparathyroidism of renal origin: Secondary | ICD-10-CM | POA: Diagnosis not present

## 2023-07-24 DIAGNOSIS — R2241 Localized swelling, mass and lump, right lower limb: Secondary | ICD-10-CM | POA: Diagnosis not present

## 2023-07-24 NOTE — Progress Notes (Signed)
Subjective:    Patient ID: Sean [name]., male    DOB: Jul 18, 1941, 81 y.o.   MRN: [id number] Chief Complaint  Patient presents with   Follow-up    [facility] 1 month follow up    The patient is an 82 year old male who underwent right lower extremity DVT thrombectomy on 06/13/2023.  The patient also presented with what was concerning for pulmonary embolism however did not have significant respiratory symptoms at that time and we were unable to obtain a CT scan due to his kidney function.  VQ scan was highly suggestive for pulmonary embolism.  Given his known DVT and his lack of right heart strain or significant respiratory symptoms it was decided not to intervene for a pulmonary thrombectomy but to treat his right lower extremity instead.  Since that time he notes that he has not had any issues with his breathing.  He also notes that his swelling is improved but not completely resolved.    Review of Systems  Cardiovascular:  Positive for leg swelling.  All other systems reviewed and are negative.      Objective:   Physical Exam Vitals reviewed.  HENT:     Head: Normocephalic.  Cardiovascular:     Rate and Rhythm: Normal rate.  Pulmonary:     Effort: Pulmonary effort is normal.  Musculoskeletal:     Right lower leg: Edema present.  Skin:    General: Skin is warm and dry.  Neurological:     Mental Status: He is alert and oriented to person, place, and time.  Psychiatric:        Mood and Affect: Mood normal.        Behavior: Behavior normal.        Thought Content: Thought content normal.        Judgment: Judgment normal.     BP (!) 95/57 (BP Location: Right Arm)   Pulse 69   Resp 16   Past Medical History:  Diagnosis Date   Arthritis    Asthma    CAD (coronary artery disease)    a. LHC 7/19: ostLM 30%, ostLAD 30%, p-mLAD 99% mod calcified, ostD1 90%, ost-pLCx 80% ulcerative & mod calcified, p-mLCx 50%, OM2 50%, mod dz LPDA, RCA small w/ diffuse dz throughout; b.  recommendation of CABG; c. 3-V CABG 08/26/18 (LIMA-LAD, VG-Diag, VG-LCx)   Chronic systolic CHF (congestive heart failure) (HCC)    a. 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 DVT of left subclavian dx'd 06/17/2018; b. Eliquis   GERD (gastroesophageal reflux disease)    takes otc occasionally   History of complete heart block    a. s/p MDT PPM 05/2017   Hypertension    Obesity    Presence of permanent cardiac pacemaker    Dr. Rosette Reveal implanted 05/2017   PVC's (premature ventricular contractions)    a. PPM-induced    Social History   Socioeconomic History   Marital status: Married    Spouse name: Not on file   Number of children: Not on file   Years of education: Not on file   Highest education level: Not on file  Occupational History   Not on file  Tobacco Use   Smoking status:  Never   Smokeless tobacco: Never  Vaping Use   Vaping status: Never Used  Substance and Sexual Activity   Alcohol use: Yes    Comment: occ.   Drug use: No   Sexual activity: Not on file  Other Topics Concern   Not on file  Social History Narrative   Not on file   Social Determinants of Health   Financial Resource Strain: Low Risk  (11/09/2022)   [facility] from [facility], [facility]   Overall Financial Resource Strain (CARDIA)    Difficulty of Paying Living Expenses: Not hard at all  Food Insecurity: No Food Insecurity (06/13/2023)   Hunger Vital Sign    Worried About Running Out of Food in the Last Year: Never true    Ran Out of Food in the Last Year: Never true  Transportation Needs: No Transportation Needs (06/13/2023)   PRAPARE - Administrator, Civil Service (Medical): No    Lack of Transportation  (Non-Medical): No  Physical Activity: Not on file  Stress: Not on file  Social Connections: Not on file  Intimate Partner Violence: Not At Risk (06/13/2023)   Humiliation, Afraid, Rape, and Kick questionnaire    Fear of Current or Ex-Partner: No    Emotionally Abused: No    Physically Abused: No    Sexually Abused: No    Past Surgical History:  Procedure Laterality Date   A-FLUTTER ABLATION N/A 03/03/2020   Procedure: A-FLUTTER ABLATION;  Surgeon: Duke Salvia, MD;  Location: [facility] INVASIVE CV LAB;  Service: Cardiovascular;  Laterality: N/A;   BACK SURGERY     CORONARY ARTERY BYPASS GRAFT N/A 08/26/2018   Procedure: CORONARY ARTERY BYPASS GRAFTING (CABG) x3 , Using left internal mammory artery and right leg greater saphronious vein. Harvested endoscopically.;  Surgeon: Kerin Perna, MD;  Location: [facility] OR;  Service: Open Heart Surgery;  Laterality: N/A;   EPICARDIAL PACING LEAD PLACEMENT N/A 08/26/2018   Procedure: LV PACING LEAD PLACEMENT;  Surgeon: Kerin Perna, MD;  Location: [facility] OR;  Service: Thoracic;  Laterality: N/A;   EPICARDIAL PACING LEAD PLACEMENT Left 11/07/2018   Procedure: REVISE PACEMAKER LEAD LEFT CHEST;  Surgeon: Kerin Perna, MD;  Location: [facility] OR;  Service: Thoracic;  Laterality: Left;   INSERT / REPLACE / REMOVE PACEMAKER     [employer]   LEFT HEART CATH AND CORONARY ANGIOGRAPHY N/A 06/13/2018   Procedure: LEFT HEART CATH AND CORONARY ANGIOGRAPHY;  Surgeon: Iran Ouch, MD;  Location: [facility] INVASIVE CV LAB;  Service: Cardiovascular;  Laterality: N/A;   LEG SURGERY     PACEMAKER IMPLANT N/A 06/11/2017   Procedure: Pacemaker Implant;  Surgeon: Marinus Maw, MD;  Location: [facility] INVASIVE CV LAB;  Service: Cardiovascular;  Laterality: N/A;   PERIPHERAL VASCULAR THROMBECTOMY Right 06/13/2023   Procedure: PERIPHERAL VASCULAR THROMBECTOMY;  Surgeon: Annice Needy, MD;  Location: [facility] INVASIVE CV LAB;  Service: Cardiovascular;  Laterality: Right;   PLEURAL EFFUSION  DRAINAGE Left 09/24/2018   Procedure: DRAINAGE OF LOCULATED PLEURAL EFFUSION;  Surgeon: Kerin Perna, MD;  Location: [facility] OR;  Service: Thoracic;  Laterality: Left;   TEE WITHOUT CARDIOVERSION N/A 08/26/2018   Procedure: TRANSESOPHAGEAL ECHOCARDIOGRAM (TEE);  Surgeon: Donata Clay, Theron Arista, MD;  Location: [facility] OR;  Service: Open Heart Surgery;  Laterality: N/A;   TEE WITHOUT CARDIOVERSION  03/03/2020   Procedure: Transesophageal Echocardiogram (Tee);  Surgeon: Duke Salvia, MD;  Location: [facility] INVASIVE CV LAB;  Service: Cardiovascular;;  VIDEO ASSISTED THORACOSCOPY Left 09/24/2018   Procedure: VIDEO ASSISTED THORACOSCOPY;  Surgeon: Donata Clay, Theron Arista, MD;  Location: [facility] OR;  Service: Thoracic;  Laterality: Left;    Family History  Problem Relation Age of Onset   Stroke Mother    Stroke Father    Alcohol abuse Father    Diabetes Brother    Alcohol abuse Brother    Stroke Maternal Grandfather     Allergies  Allergen Reactions   Azithromycin Hives   Erythromycin Hives       Latest Ref Rng & Units 06/16/2023    5:42 AM 06/15/2023    6:11 AM 06/14/2023    5:57 AM  CBC  WBC 4.0 - 10.5 K/uL 15.5  12.1  14.0   Hemoglobin 13.0 - 17.0 g/dL 9.2  9.1  47.8   Hematocrit 39.0 - 52.0 % 27.7  27.1  29.7   Platelets 150 - 400 K/uL 183  153  150       CMP     Component Value Date/Time   NA 135 06/16/2023 0542   NA 142 01/05/2022 1316   NA 141 08/14/2014 0418   K 4.9 06/16/2023 0542   K 3.6 08/14/2014 0418   CL 108 06/16/2023 0542   CL 106 08/14/2014 0418   CO2 19 (L) 06/16/2023 0542   CO2 29 08/14/2014 0418   GLUCOSE 146 (H) 06/16/2023 0542   GLUCOSE 106 (H) 08/14/2014 0418   BUN 42 (H) 06/16/2023 0542   BUN 27 01/05/2022 1316   BUN 30 (H) 08/14/2014 0418   CREATININE 2.86 (H) 06/16/2023 0542   CREATININE 1.18 08/14/2014 1227   CALCIUM 7.4 (L) 06/16/2023 0542   CALCIUM 8.3 (L) 08/14/2014 0418   PROT 7.6 06/12/2023 1414   PROT 6.4 11/19/2018 1453   ALBUMIN 4.5 06/12/2023 1414    ALBUMIN 4.3 11/19/2018 1453   AST 13 (L) 06/12/2023 1414   ALT 9 06/12/2023 1414   ALKPHOS 44 06/12/2023 1414   BILITOT 1.4 (H) 06/12/2023 1414   BILITOT 0.8 11/19/2018 1453   EGFR 43 (L) 01/05/2022 1316   GFRNONAA 21 (L) 06/16/2023 0542   GFRNONAA >60 08/14/2014 1227     No results found.     Assessment & Plan:   1. DVT, lower extremity, proximal, acute, right (HCC) Today the right lower extremity edema is improved.  Will have him return in about 2 to 3 weeks in order to evaluate progress post thrombectomy.  He is advised to continue with conservative therapy including use of medical grade compression, elevation and activity in the interim.  He is also advised to continue with his Eliquis 5 mg twice daily.  2. Acute pulmonary embolism, unspecified pulmonary embolism type, unspecified whether acute cor pulmonale present [facility]) Given concern that the patient had a pulmonary embolism however due to his kidney function we were unable to require studies to diagnose, we will plan on treating the patient as if he has had a pulmonary embolism and recommend that he remain on anticoagulation for 1 year.  The patient has had a previous history of a left subclavian DVT but I suspect this was provoked by his pacemaker placement.  Given this following his 1 year follow-up, prophylactic Eliquis dosage can be considered.  3. Essential hypertension Continue antihypertensive medications as already ordered, these medications have been reviewed and there are no changes at this time.   Current Outpatient Medications on File Prior to Visit  Medication Sig Dispense Refill   acetaminophen (TYLENOL) 500  MG tablet Take 500 mg by mouth every 6 (six) hours as needed for moderate pain or headache.     allopurinol (ZYLOPRIM) 100 MG tablet Take 100 mg by mouth every other day. At night     apixaban (ELIQUIS) 5 MG TABS tablet Take 2 tablets (10 mg total) by mouth 2 (two) times daily for 6 days, THEN 1 tablet (5 mg  total) 2 (two) times daily. 60 tablet 0   aspirin EC 81 MG tablet Take 1 tablet (81 mg total) by mouth daily. Swallow whole. 90 tablet 3   atorvastatin (LIPITOR) 40 MG tablet TAKE ONE TABLET EVERY DAY 90 tablet 3   EPINEPHrine (PRIMATENE MIST) 0.125 MG/ACT AERO Inhale 1 puff into the lungs daily as needed (shortness of breath).     loratadine (CLARITIN) 10 MG tablet Take 1 tablet (10 mg total) by mouth daily. 90 tablet 3   losartan (COZAAR) 25 MG tablet TAKE ONE TABLET (25 MG) BY MOUTH EVERY DAY (Patient taking differently: 25 mg. Take 1/2 tablet daily) 90 tablet 3   metoprolol succinate (TOPROL-XL) 25 MG 24 hr tablet TAKE 1 TABLET BY MOUTH DAILY (Patient taking differently: Take 25 mg by mouth daily. Take 1/2 tablet daily) 90 tablet 0   Multiple Vitamin (MULTIVITAMIN WITH MINERALS) TABS tablet Take 1 tablet by mouth daily.     Specialty Vitamins Products (PROSTATE PO) Take 1 tablet by mouth at bedtime.      spironolactone (ALDACTONE) 25 MG tablet TAKE 1/2 TABLET BY MOUTH EVERY DAY 45 tablet 1   tamsulosin (FLOMAX) 0.4 MG CAPS capsule Take 0.4 mg by mouth daily.     No current facility-administered medications on file prior to visit.    There are no Patient Instructions on file for this visit. No follow-ups on file.   Georgiana Spinner, NP

## 2023-08-06 ENCOUNTER — Ambulatory Visit (INDEPENDENT_AMBULATORY_CARE_PROVIDER_SITE_OTHER): Payer: PPO

## 2023-08-06 DIAGNOSIS — I442 Atrioventricular block, complete: Secondary | ICD-10-CM

## 2023-08-07 DIAGNOSIS — N1832 Chronic kidney disease, stage 3b: Secondary | ICD-10-CM | POA: Diagnosis not present

## 2023-08-07 DIAGNOSIS — N170 Acute kidney failure with tubular necrosis: Secondary | ICD-10-CM | POA: Diagnosis not present

## 2023-08-07 DIAGNOSIS — R2241 Localized swelling, mass and lump, right lower limb: Secondary | ICD-10-CM | POA: Diagnosis not present

## 2023-08-07 DIAGNOSIS — E875 Hyperkalemia: Secondary | ICD-10-CM | POA: Diagnosis not present

## 2023-08-07 DIAGNOSIS — N281 Cyst of kidney, acquired: Secondary | ICD-10-CM | POA: Diagnosis not present

## 2023-08-07 LAB — CUP PACEART REMOTE DEVICE CHECK
Battery Remaining Longevity: 16 mo
Battery Voltage: 2.87 V
Brady Statistic AP VP Percent: 65.19 %
Brady Statistic AP VS Percent: 0 %
Brady Statistic AS VP Percent: 34.78 %
Brady Statistic AS VS Percent: 0.02 %
Brady Statistic RA Percent Paced: 65.15 %
Brady Statistic RV Percent Paced: 99.97 %
Date Time Interrogation Session: 20240917000554
Implantable Lead Connection Status: 753985
Implantable Lead Connection Status: 753985
Implantable Lead Implant Date: 20180723
Implantable Lead Implant Date: 20180723
Implantable Lead Location: 753859
Implantable Lead Location: 753860
Implantable Lead Model: 3830
Implantable Lead Model: 5076
Implantable Pulse Generator Implant Date: 20180723
Lead Channel Impedance Value: 323 Ohm
Lead Channel Impedance Value: 342 Ohm
Lead Channel Impedance Value: 361 Ohm
Lead Channel Impedance Value: 456 Ohm
Lead Channel Pacing Threshold Amplitude: 0.75 V
Lead Channel Pacing Threshold Amplitude: 1.25 V
Lead Channel Pacing Threshold Pulse Width: 0.4 ms
Lead Channel Pacing Threshold Pulse Width: 0.4 ms
Lead Channel Sensing Intrinsic Amplitude: 1.375 mV
Lead Channel Sensing Intrinsic Amplitude: 1.375 mV
Lead Channel Sensing Intrinsic Amplitude: 5 mV
Lead Channel Sensing Intrinsic Amplitude: 5 mV
Lead Channel Setting Pacing Amplitude: 2 V
Lead Channel Setting Pacing Amplitude: 2.5 V
Lead Channel Setting Pacing Pulse Width: 0.8 ms
Lead Channel Setting Sensing Sensitivity: 2.8 mV
Zone Setting Status: 755011
Zone Setting Status: 755011

## 2023-08-17 ENCOUNTER — Other Ambulatory Visit (INDEPENDENT_AMBULATORY_CARE_PROVIDER_SITE_OTHER): Payer: Self-pay | Admitting: Nurse Practitioner

## 2023-08-17 DIAGNOSIS — I824Y1 Acute embolism and thrombosis of unspecified deep veins of right proximal lower extremity: Secondary | ICD-10-CM

## 2023-08-18 ENCOUNTER — Other Ambulatory Visit: Payer: Self-pay | Admitting: Cardiovascular Disease

## 2023-08-21 ENCOUNTER — Ambulatory Visit (INDEPENDENT_AMBULATORY_CARE_PROVIDER_SITE_OTHER): Payer: HMO

## 2023-08-21 ENCOUNTER — Encounter (INDEPENDENT_AMBULATORY_CARE_PROVIDER_SITE_OTHER): Payer: Self-pay | Admitting: Nurse Practitioner

## 2023-08-21 ENCOUNTER — Ambulatory Visit (INDEPENDENT_AMBULATORY_CARE_PROVIDER_SITE_OTHER): Payer: HMO | Admitting: Nurse Practitioner

## 2023-08-21 VITALS — BP 141/91 | HR 70 | Resp 18 | Ht 70.5 in | Wt 224.4 lb

## 2023-08-21 DIAGNOSIS — I2699 Other pulmonary embolism without acute cor pulmonale: Secondary | ICD-10-CM | POA: Diagnosis not present

## 2023-08-21 DIAGNOSIS — I10 Essential (primary) hypertension: Secondary | ICD-10-CM

## 2023-08-21 DIAGNOSIS — I824Y1 Acute embolism and thrombosis of unspecified deep veins of right proximal lower extremity: Secondary | ICD-10-CM

## 2023-08-21 NOTE — Progress Notes (Signed)
Subjective:    Patient ID: Sean [name]., male    DOB: May 21, 1941, 82 y.o.   MRN: [id number] Chief Complaint  Patient presents with   Follow-up    1 month follow up with DVT    The patient is an 82 year old male who underwent right lower extremity DVT thrombectomy on 06/13/2023.  The patient also presented with what was concerning for pulmonary embolism however did not have significant respiratory symptoms at that time and we were unable to obtain a CT scan due to his kidney function.  VQ scan was highly suggestive for pulmonary embolism.  Given his known DVT and his lack of right heart strain or significant respiratory symptoms it was decided not to intervene for a pulmonary thrombectomy but to treat his right lower extremity instead.  Since that time he notes that he has not had any issues with his breathing.  He notes that since he was last seen the swelling has decreased although it is still notable today.  He denies any open wounds or ulcerations.  He denies any formation of any blisters.  He denies having any pain or discomfort in the right lower extremity at all.  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  No great saphenous vein is notated today as it was likely utilized to his 2019 CABG.  He continues with Eliquis without issue.    Review of Systems  Respiratory:  Negative for shortness of breath.   Cardiovascular:  Positive for leg swelling.  All other systems reviewed and are negative.      Objective:   Physical Exam Vitals reviewed.  HENT:     Head: Normocephalic.  Cardiovascular:     Rate and Rhythm: Normal rate.     Pulses: Normal pulses.  Pulmonary:     Effort: Pulmonary effort is normal.  Musculoskeletal:     Right lower leg: 2+ Edema present.  Skin:    General: Skin is warm and dry.  Neurological:     Mental Status: He is alert and oriented to person, place, and time.   Psychiatric:        Mood and Affect: Mood normal.        Behavior: Behavior normal.        Thought Content: Thought content normal.        Judgment: Judgment normal.     BP (!) 141/91 (BP Location: Right Arm)   Pulse 70   Resp 18   Ht 5' 10.5" (1.791 m)   Wt 224 lb 6.4 oz (101.8 kg)   BMI 31.74 kg/m   Past Medical History:  Diagnosis Date   Arthritis    Asthma    CAD (coronary artery disease)    a. LHC 7/19: ostLM 30%, ostLAD 30%, p-mLAD 99% mod calcified, ostD1 90%, ost-pLCx 80% ulcerative & mod calcified, p-mLCx 50%, OM2 50%, mod dz LPDA, RCA small w/ diffuse dz throughout; b. recommendation of CABG; c. 3-V CABG 08/26/18 (LIMA-LAD, VG-Diag, VG-LCx)   Chronic systolic CHF (congestive heart failure) (HCC)    a. 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 DVT of left subclavian dx'd 06/17/2018; b. Eliquis  GERD (gastroesophageal reflux disease)    takes otc occasionally   History of complete heart block    a. s/p MDT PPM 05/2017   Hypertension    Obesity    Presence of permanent cardiac pacemaker    Dr. Rosette Reveal implanted 05/2017   PVC's (premature ventricular contractions)    a. PPM-induced    Social History   Socioeconomic History   Marital status: Married    Spouse name: Not on file   Number of children: Not on file   Years of education: Not on file   Highest education level: Not on file  Occupational History   Not on file  Tobacco Use   Smoking status: Never   Smokeless tobacco: Never  Vaping Use   Vaping status: Never Used  Substance and Sexual Activity   Alcohol use: Yes    Comment: occ.   Drug use: No   Sexual activity: Not on file  Other Topics Concern   Not on file  Social History Narrative   Not on file   Social  Determinants of Health   Financial Resource Strain: Low Risk  (11/09/2022)   Received from [facility], [facility]   Overall Financial Resource Strain (CARDIA)    Difficulty of Paying Living Expenses: Not hard at all  Food Insecurity: No Food Insecurity (06/13/2023)   Hunger Vital Sign    Worried About Running Out of Food in the Last Year: Never true    Ran Out of Food in the Last Year: Never true  Transportation Needs: No Transportation Needs (06/13/2023)   PRAPARE - Administrator, Civil Service (Medical): No    Lack of Transportation (Non-Medical): No  Physical Activity: Not on file  Stress: Not on file  Social Connections: Not on file  Intimate Partner Violence: Not At Risk (06/13/2023)   Humiliation, Afraid, Rape, and Kick questionnaire    Fear of Current or Ex-Partner: No    Emotionally Abused: No    Physically Abused: No    Sexually Abused: No    Past Surgical History:  Procedure Laterality Date   A-FLUTTER ABLATION N/A 03/03/2020   Procedure: A-FLUTTER ABLATION;  Surgeon: Duke Salvia, MD;  Location: [facility] INVASIVE CV LAB;  Service: Cardiovascular;  Laterality: N/A;   BACK SURGERY     CORONARY ARTERY BYPASS GRAFT N/A 08/26/2018   Procedure: CORONARY ARTERY BYPASS GRAFTING (CABG) x3 , Using left internal mammory artery and right leg greater saphronious vein. Harvested endoscopically.;  Surgeon: Kerin Perna, MD;  Location: [facility] OR;  Service: Open Heart Surgery;  Laterality: N/A;   EPICARDIAL PACING LEAD PLACEMENT N/A 08/26/2018   Procedure: LV PACING LEAD PLACEMENT;  Surgeon: Kerin Perna, MD;  Location: [facility] OR;  Service: Thoracic;  Laterality: N/A;   EPICARDIAL PACING LEAD PLACEMENT Left 11/07/2018   Procedure: REVISE PACEMAKER LEAD LEFT CHEST;  Surgeon: Kerin Perna, MD;  Location: [facility] OR;  Service: Thoracic;  Laterality: Left;   INSERT / REPLACE / REMOVE PACEMAKER     [employer]   LEFT HEART CATH AND CORONARY ANGIOGRAPHY  N/A 06/13/2018   Procedure: LEFT HEART CATH AND CORONARY ANGIOGRAPHY;  Surgeon: Iran Ouch, MD;  Location: [facility] INVASIVE CV LAB;  Service: Cardiovascular;  Laterality: N/A;   LEG SURGERY     PACEMAKER IMPLANT N/A 06/11/2017   Procedure: Pacemaker Implant;  Surgeon: Marinus Maw, MD;  Location: [facility] INVASIVE CV LAB;  Service: Cardiovascular;  Laterality: N/A;  PERIPHERAL VASCULAR THROMBECTOMY Right 06/13/2023   Procedure: PERIPHERAL VASCULAR THROMBECTOMY;  Surgeon: Annice Needy, MD;  Location: [facility] INVASIVE CV LAB;  Service: Cardiovascular;  Laterality: Right;   PLEURAL EFFUSION DRAINAGE Left 09/24/2018   Procedure: DRAINAGE OF LOCULATED PLEURAL EFFUSION;  Surgeon: Kerin Perna, MD;  Location: [facility] OR;  Service: Thoracic;  Laterality: Left;   TEE WITHOUT CARDIOVERSION N/A 08/26/2018   Procedure: TRANSESOPHAGEAL ECHOCARDIOGRAM (TEE);  Surgeon: Donata Clay, Theron Arista, MD;  Location: [facility] OR;  Service: Open Heart Surgery;  Laterality: N/A;   TEE WITHOUT CARDIOVERSION  03/03/2020   Procedure: Transesophageal Echocardiogram (Tee);  Surgeon: Duke Salvia, MD;  Location: [facility] INVASIVE CV LAB;  Service: Cardiovascular;;   VIDEO ASSISTED THORACOSCOPY Left 09/24/2018   Procedure: VIDEO ASSISTED THORACOSCOPY;  Surgeon: Donata Clay, Theron Arista, MD;  Location: [facility] OR;  Service: Thoracic;  Laterality: Left;    Family History  Problem Relation Age of Onset   Stroke Mother    Stroke Father    Alcohol abuse Father    Diabetes Brother    Alcohol abuse Brother    Stroke Maternal Grandfather     Allergies  Allergen Reactions   Azithromycin Hives   Erythromycin Hives       Latest Ref Rng & Units 06/16/2023    5:42 AM 06/15/2023    6:11 AM 06/14/2023    5:57 AM  CBC  WBC 4.0 - 10.5 K/uL 15.5  12.1  14.0   Hemoglobin 13.0 - 17.0 g/dL 9.2  9.1  69.6   Hematocrit 39.0 - 52.0 % 27.7  27.1  29.7   Platelets 150 - 400 K/uL 183  153  150       CMP     Component Value Date/Time   NA 135 06/16/2023 0542   NA  142 01/05/2022 1316   NA 141 08/14/2014 0418   K 4.9 06/16/2023 0542   K 3.6 08/14/2014 0418   CL 108 06/16/2023 0542   CL 106 08/14/2014 0418   CO2 19 (L) 06/16/2023 0542   CO2 29 08/14/2014 0418   GLUCOSE 146 (H) 06/16/2023 0542   GLUCOSE 106 (H) 08/14/2014 0418   BUN 42 (H) 06/16/2023 0542   BUN 27 01/05/2022 1316   BUN 30 (H) 08/14/2014 0418   CREATININE 2.86 (H) 06/16/2023 0542   CREATININE 1.18 08/14/2014 1227   CALCIUM 7.4 (L) 06/16/2023 0542   CALCIUM 8.3 (L) 08/14/2014 0418   PROT 7.6 06/12/2023 1414   PROT 6.4 11/19/2018 1453   ALBUMIN 4.5 06/12/2023 1414   ALBUMIN 4.3 11/19/2018 1453   AST 13 (L) 06/12/2023 1414   ALT 9 06/12/2023 1414   ALKPHOS 44 06/12/2023 1414   BILITOT 1.4 (H) 06/12/2023 1414   BILITOT 0.8 11/19/2018 1453   EGFR 43 (L) 01/05/2022 1316   GFRNONAA 21 (L) 06/16/2023 0542   GFRNONAA >60 08/14/2014 1227     No results found.     Assessment & Plan:   1. DVT, lower extremity, proximal, acute, right (HCC) The patient does still have some notable edema in his right lower extremity but it is improved.  The patient is advised to continue with use of medical grade compression and elevation.  He is also advised that he can begin activity as tolerable.  This is also encouraged as this will also likely help with some lower extremity edema as well.  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  He will continue with his Eliquis.  Will have him return in 3 months to evaluate his lower extremity edema.  2. Essential hypertension Continue antihypertensive medications as already ordered, these medications have been reviewed and there are no changes at this time.  3. Acute pulmonary embolism, unspecified pulmonary embolism type, unspecified whether acute cor pulmonale present [facility]) Given concern  that the patient had a pulmonary embolism however due to his kidney function we were unable to require studies to diagnose, we will plan on treating the patient as if he has had a pulmonary embolism and recommend that he remain on anticoagulation for 1 year. The patient has had a previous history of a left subclavian DVT but I suspect this was provoked by his pacemaker placement. Given this following his 1 year follow-up, prophylactic Eliquis dosage can be considered.    Current Outpatient Medications on File Prior to Visit  Medication Sig Dispense Refill   acetaminophen (TYLENOL) 500 MG tablet Take 500 mg by mouth every 6 (six) hours as needed for moderate pain or headache.     allopurinol (ZYLOPRIM) 100 MG tablet Take 100 mg by mouth every other day. At night     apixaban (ELIQUIS) 5 MG TABS tablet Take 2 tablets (10 mg total) by mouth 2 (two) times daily for 6 days, THEN 1 tablet (5 mg total) 2 (two) times daily. 60 tablet 0   atorvastatin (LIPITOR) 40 MG tablet TAKE ONE TABLET EVERY DAY 90 tablet 3   EPINEPHrine (PRIMATENE MIST) 0.125 MG/ACT AERO Inhale 1 puff into the lungs daily as needed (shortness of breath).     loratadine (CLARITIN) 10 MG tablet Take 1 tablet (10 mg total) by mouth daily. 90 tablet 3   metoprolol succinate (TOPROL-XL) 25 MG 24 hr tablet TAKE 1 TABLET BY MOUTH DAILY (Patient taking differently: Take 12.5 mg by mouth daily. 12.5mg ) 90 tablet 0   Multiple Vitamin (MULTIVITAMIN WITH MINERALS) TABS tablet Take 1 tablet by mouth daily.     Specialty Vitamins Products (PROSTATE PO) Take 1 tablet by mouth at bedtime.      tamsulosin (FLOMAX) 0.4 MG CAPS capsule Take 0.4 mg by mouth daily.     aspirin EC 81 MG tablet Take 1 tablet (81 mg total) by mouth daily. Swallow whole. (Patient not taking: Reported on 08/21/2023) 90 tablet 3   losartan (COZAAR) 25 MG tablet TAKE ONE TABLET (25 MG) BY MOUTH EVERY DAY (Patient not taking: Reported on 08/21/2023) 90 tablet 3   spironolactone  (ALDACTONE) 25 MG tablet TAKE 1/2 TABLET BY MOUTH EVERY DAY (Patient not taking: Reported on 08/21/2023) 45 tablet 1   No current facility-administered medications on file prior to visit.    There are no Patient Instructions on file for this visit. No follow-ups on file.   Georgiana Spinner, NP

## 2023-08-22 NOTE — Progress Notes (Signed)
Remote pacemaker transmission.   

## 2023-09-13 DIAGNOSIS — N170 Acute kidney failure with tubular necrosis: Secondary | ICD-10-CM | POA: Diagnosis not present

## 2023-09-13 DIAGNOSIS — R2241 Localized swelling, mass and lump, right lower limb: Secondary | ICD-10-CM | POA: Diagnosis not present

## 2023-09-13 DIAGNOSIS — E875 Hyperkalemia: Secondary | ICD-10-CM | POA: Diagnosis not present

## 2023-09-13 DIAGNOSIS — N281 Cyst of kidney, acquired: Secondary | ICD-10-CM | POA: Diagnosis not present

## 2023-09-13 DIAGNOSIS — N1832 Chronic kidney disease, stage 3b: Secondary | ICD-10-CM | POA: Diagnosis not present

## 2023-09-18 DIAGNOSIS — N281 Cyst of kidney, acquired: Secondary | ICD-10-CM | POA: Diagnosis not present

## 2023-09-18 DIAGNOSIS — N1832 Chronic kidney disease, stage 3b: Secondary | ICD-10-CM | POA: Diagnosis not present

## 2023-09-18 DIAGNOSIS — I10 Essential (primary) hypertension: Secondary | ICD-10-CM | POA: Diagnosis not present

## 2023-09-18 DIAGNOSIS — N170 Acute kidney failure with tubular necrosis: Secondary | ICD-10-CM | POA: Diagnosis not present

## 2023-09-18 DIAGNOSIS — E875 Hyperkalemia: Secondary | ICD-10-CM | POA: Diagnosis not present

## 2023-10-04 DIAGNOSIS — N184 Chronic kidney disease, stage 4 (severe): Secondary | ICD-10-CM | POA: Diagnosis not present

## 2023-11-05 ENCOUNTER — Ambulatory Visit (INDEPENDENT_AMBULATORY_CARE_PROVIDER_SITE_OTHER): Payer: HMO

## 2023-11-05 DIAGNOSIS — I442 Atrioventricular block, complete: Secondary | ICD-10-CM

## 2023-11-06 ENCOUNTER — Ambulatory Visit: Payer: HMO | Attending: Cardiovascular Disease | Admitting: Cardiovascular Disease

## 2023-11-06 ENCOUNTER — Encounter: Payer: Self-pay | Admitting: Cardiovascular Disease

## 2023-11-06 VITALS — BP 122/80 | HR 70 | Ht 70.5 in | Wt 221.4 lb

## 2023-11-06 DIAGNOSIS — I5022 Chronic systolic (congestive) heart failure: Secondary | ICD-10-CM | POA: Diagnosis not present

## 2023-11-06 DIAGNOSIS — E785 Hyperlipidemia, unspecified: Secondary | ICD-10-CM | POA: Diagnosis not present

## 2023-11-06 DIAGNOSIS — I2510 Atherosclerotic heart disease of native coronary artery without angina pectoris: Secondary | ICD-10-CM | POA: Diagnosis not present

## 2023-11-06 DIAGNOSIS — Z950 Presence of cardiac pacemaker: Secondary | ICD-10-CM

## 2023-11-06 LAB — CUP PACEART REMOTE DEVICE CHECK
Battery Remaining Longevity: 11 mo
Battery Voltage: 2.84 V
Brady Statistic AP VP Percent: 67.97 %
Brady Statistic AP VS Percent: 0.05 %
Brady Statistic AS VP Percent: 31.66 %
Brady Statistic AS VS Percent: 0.32 %
Brady Statistic RA Percent Paced: 67.99 %
Brady Statistic RV Percent Paced: 99.63 %
Date Time Interrogation Session: 20241216160138
Implantable Lead Connection Status: 753985
Implantable Lead Connection Status: 753985
Implantable Lead Implant Date: 20180723
Implantable Lead Implant Date: 20180723
Implantable Lead Location: 753859
Implantable Lead Location: 753860
Implantable Lead Model: 3830
Implantable Lead Model: 5076
Implantable Pulse Generator Implant Date: 20180723
Lead Channel Impedance Value: 285 Ohm
Lead Channel Impedance Value: 342 Ohm
Lead Channel Impedance Value: 342 Ohm
Lead Channel Impedance Value: 437 Ohm
Lead Channel Pacing Threshold Amplitude: 0.875 V
Lead Channel Pacing Threshold Amplitude: 1.25 V
Lead Channel Pacing Threshold Pulse Width: 0.4 ms
Lead Channel Pacing Threshold Pulse Width: 0.4 ms
Lead Channel Sensing Intrinsic Amplitude: 0.875 mV
Lead Channel Sensing Intrinsic Amplitude: 0.875 mV
Lead Channel Sensing Intrinsic Amplitude: 5 mV
Lead Channel Sensing Intrinsic Amplitude: 5 mV
Lead Channel Setting Pacing Amplitude: 2 V
Lead Channel Setting Pacing Amplitude: 2.5 V
Lead Channel Setting Pacing Pulse Width: 0.8 ms
Lead Channel Setting Sensing Sensitivity: 2.8 mV
Zone Setting Status: 755011
Zone Setting Status: 755011

## 2023-11-06 NOTE — Patient Instructions (Signed)
 Medication Instructions:  No changes *If you need a refill on your cardiac medications before your next appointment, please call your pharmacy*   Lab Work: None ordered If you have labs (blood work) drawn today and your tests are completely normal, you will receive your results only by: MyChart Message (if you have MyChart) OR A paper copy in the mail If you have any lab test that is abnormal or we need to change your treatment, we will call you to review the results.   Testing/Procedures: None ordered   Follow-Up: At [facility], you and your health needs are our priority.  As part of our continuing mission to provide you with exceptional heart care, we have created designated Provider Care Teams.  These Care Teams include your primary Cardiologist (physician) and Advanced Practice Providers (APPs -  Physician Assistants and Nurse Practitioners) who all work together to provide you with the care you need, when you need it.  We recommend signing up for the patient portal called "MyChart".  Sign up information is provided on this After Visit Summary.  MyChart is used to connect with patients for Virtual Visits (Telemedicine).  Patients are able to view lab/test results, encounter notes, upcoming appointments, etc.  Non-urgent messages can be sent to your provider as well.   To learn more about what you can do with MyChart, go to [email or web address].    Your next appointment:   6 month(s)  Provider:   You may see Dr. Kirke Corin or one of the following Advanced Practice Providers on your designated Care Team:   Nicolasa Ducking, NP Eula Listen, PA-C Cadence Fransico Michael, PA-C Charlsie Quest, NP Carlos Levering, NP

## 2023-11-06 NOTE — Progress Notes (Signed)
Cardiology Office Note   Date:  11/06/2023   ID:  [name], DOB 1941-03-29, MRN [id number]  PCP:  Jerl Mina, MD  Cardiologist: Dr. Graciela Husbands  Chief Complaint  Patient presents with   Follow-up    Patient denies new or acute cardiac problems/concerns today.        History of Present Illness: Sean [name]. is a 82 y.o. male who is here today for follow-up visit regarding coronary artery disease and chronic systolic heart failure.   The patient has known history of pacemaker placement and PVCs.  Other medical problems include stage III chronic kidney disease, atrial flutter, hypertension and obesity.  He had dual-chamber pacemaker placement  in the setting of third-degree AV block. He had an echocardiogram done in May of 2019 which showed an EF of 35 to 40% with diffuse hypokinesis, mild mitral regurgitation and mildly dilated left atrium.  Due to this cardiomyopathy, he underwent CTA of the coronary arteries which showed evidence of three-vessel coronary artery disease with a left dominant system.  FFR was less than 0.6 in all vessels. Cardiac catheterization in 2019 showed left dominant coronary arteries with severe heavily calcified three-vessel coronary artery disease including subtotal occlusion of the mid LAD and ulcerated plaque in the proximal left circumflex.  He underwent CABG at [facility]. He underwent atrial flutter ablation in April 2021.  He was taken off anticoagulation with Eliquis.  He was hospitalized in July of this year with extensive right lower extremity DVT with suspected pulmonary embolism.  In addition, he had progressive acute on chronic kidney disease.  He required thrombectomy and has been placed back on Eliquis since then.  Due to worsening renal disease, both spironolactone and losartan were discontinued. He had an echocardiogram done in July while hospitalized for DVT.  It showed an EF of 45 to 50%. He is feeling better with no chest pain or  worsening dyspnea.  Right lower extremity swelling improved.  Past Medical History:  Diagnosis Date   Arthritis    Asthma    CAD (coronary artery disease)    a. LHC 7/19: ostLM 30%, ostLAD 30%, p-mLAD 99% mod calcified, ostD1 90%, ost-pLCx 80% ulcerative & mod calcified, p-mLCx 50%, OM2 50%, mod dz LPDA, RCA small w/ diffuse dz throughout; b. recommendation of CABG; c. 3-V CABG 08/26/18 (LIMA-LAD, VG-Diag, VG-LCx)   Chronic systolic CHF (congestive heart failure) (HCC)    a. 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 DVT of left subclavian dx'd 06/17/2018; b. Eliquis   GERD (gastroesophageal reflux disease)    takes otc occasionally   History of complete heart block    a. s/p MDT PPM 05/2017   Hypertension    Obesity    Presence of permanent cardiac pacemaker    Dr. Rosette Reveal implanted 05/2017   PVC's (premature ventricular contractions)    a. PPM-induced    Past Surgical History:  Procedure Laterality Date   A-FLUTTER ABLATION N/A 03/03/2020   Procedure: A-FLUTTER ABLATION;  Surgeon: Duke Salvia, MD;  Location: [facility] INVASIVE CV LAB;  Service: Cardiovascular;  Laterality: N/A;   BACK SURGERY     CORONARY ARTERY BYPASS GRAFT N/A 08/26/2018   Procedure: CORONARY  ARTERY BYPASS GRAFTING (CABG) x3 , Using left internal mammory artery and right leg greater saphronious vein. Harvested endoscopically.;  Surgeon: Kerin Perna, MD;  Location: [facility] OR;  Service: Open Heart Surgery;  Laterality: N/A;   EPICARDIAL PACING LEAD PLACEMENT N/A 08/26/2018   Procedure: LV PACING LEAD PLACEMENT;  Surgeon: Kerin Perna, MD;  Location: [facility] OR;  Service: Thoracic;  Laterality: N/A;   EPICARDIAL PACING LEAD PLACEMENT Left 11/07/2018   Procedure: REVISE PACEMAKER LEAD LEFT  CHEST;  Surgeon: Kerin Perna, MD;  Location: [facility] OR;  Service: Thoracic;  Laterality: Left;   INSERT / REPLACE / REMOVE PACEMAKER     [employer]   LEFT HEART CATH AND CORONARY ANGIOGRAPHY N/A 06/13/2018   Procedure: LEFT HEART CATH AND CORONARY ANGIOGRAPHY;  Surgeon: Iran Ouch, MD;  Location: [facility] INVASIVE CV LAB;  Service: Cardiovascular;  Laterality: N/A;   LEG SURGERY     PACEMAKER IMPLANT N/A 06/11/2017   Procedure: Pacemaker Implant;  Surgeon: Marinus Maw, MD;  Location: [facility] INVASIVE CV LAB;  Service: Cardiovascular;  Laterality: N/A;   PERIPHERAL VASCULAR THROMBECTOMY Right 06/13/2023   Procedure: PERIPHERAL VASCULAR THROMBECTOMY;  Surgeon: Annice Needy, MD;  Location: [facility] INVASIVE CV LAB;  Service: Cardiovascular;  Laterality: Right;   PLEURAL EFFUSION DRAINAGE Left 09/24/2018   Procedure: DRAINAGE OF LOCULATED PLEURAL EFFUSION;  Surgeon: Kerin Perna, MD;  Location: [facility] OR;  Service: Thoracic;  Laterality: Left;   TEE WITHOUT CARDIOVERSION N/A 08/26/2018   Procedure: TRANSESOPHAGEAL ECHOCARDIOGRAM (TEE);  Surgeon: Donata Clay, Theron Arista, MD;  Location: [facility] OR;  Service: Open Heart Surgery;  Laterality: N/A;   TEE WITHOUT CARDIOVERSION  03/03/2020   Procedure: Transesophageal Echocardiogram (Tee);  Surgeon: Duke Salvia, MD;  Location: [facility] INVASIVE CV LAB;  Service: Cardiovascular;;   VIDEO ASSISTED THORACOSCOPY Left 09/24/2018   Procedure: VIDEO ASSISTED THORACOSCOPY;  Surgeon: Donata Clay, Theron Arista, MD;  Location: [facility] OR;  Service: Thoracic;  Laterality: Left;     Current Outpatient Medications  Medication Sig Dispense Refill   acetaminophen (TYLENOL) 500 MG tablet Take 500 mg by mouth every 6 (six) hours as needed for moderate pain or headache.     allopurinol (ZYLOPRIM) 100 MG tablet Take 100 mg by mouth every other day. At night     apixaban (ELIQUIS) 5 MG TABS tablet Take 5 mg by mouth 2 (two) times daily.     atorvastatin (LIPITOR) 40 MG tablet TAKE ONE TABLET EVERY DAY 90  tablet 3   EPINEPHrine (PRIMATENE MIST) 0.125 MG/ACT AERO Inhale 1 puff into the lungs daily as needed (shortness of breath).     loratadine (CLARITIN) 10 MG tablet Take 1 tablet (10 mg total) by mouth daily. 90 tablet 3   metoprolol succinate (TOPROL-XL) 25 MG 24 hr tablet TAKE 1 TABLET BY MOUTH DAILY (Patient taking differently: Take 12.5 mg by mouth daily. 12.5mg ) 90 tablet 0   Multiple Vitamin (MULTIVITAMIN WITH MINERALS) TABS tablet Take 1 tablet by mouth daily.     oxybutynin (DITROPAN) 5 MG tablet Take 1 tablet by mouth at bedtime.     Specialty Vitamins Products (PROSTATE PO) Take 1 tablet by mouth at bedtime.      tamsulosin (FLOMAX) 0.4 MG CAPS capsule Take 0.4 mg by mouth daily.     No current facility-administered medications for this visit.    Allergies:   Azithromycin and Erythromycin    Social History:  The patient  reports that he has never smoked. He has never used  smokeless tobacco. He reports current alcohol use. He reports that he does not use drugs.   Family History:  The patient's family history includes Alcohol abuse in his brother and father; Diabetes in his brother; Stroke in his father, maternal grandfather, and mother.    ROS:  Please see the history of present illness.   Otherwise, review of systems are positive for none.   All other systems are reviewed and negative.    PHYSICAL EXAM: VS:  BP 122/80 (BP Location: Left Arm, Patient Position: Sitting, Cuff Size: Large)   Pulse 70   Ht 5' 10.5" (1.791 m)   Wt 221 lb 6.4 oz (100.4 kg)   SpO2 95%   BMI 31.32 kg/m  , BMI Body mass index is 31.32 kg/m. GEN: Well nourished, well developed, in no acute distress  HEENT: normal  Neck: no JVD, carotid bruits, or masses Cardiac: RRR; no murmurs, rubs, or gallops, moderate right leg swelling. Respiratory:  clear to auscultation bilaterally, normal work of breathing GI: soft, nontender, nondistended, + BS MS: no deformity or atrophy  Skin: warm and dry, no  rash Neuro:  Strength and sensation are intact Psych: euthymic mood, full affect Radial pulses normal.  EKG:  EKG is ordered today. The ekg ordered today demonstrates : AV dual-paced rhythm with occasional ventricular-paced complexes    Recent Labs: 06/12/2023: ALT 9 06/16/2023: BUN 42; Creatinine, Ser 2.86; Hemoglobin 9.2; Platelets 183; Potassium 4.9; Sodium 135    Lipid Panel    Component Value Date/Time   CHOL 116 06/26/2018 0605   TRIG 64 06/26/2018 0605   HDL 43 06/26/2018 0605   CHOLHDL 2.7 06/26/2018 0605   VLDL 13 06/26/2018 0605   LDLCALC 60 06/26/2018 0605      Wt Readings from Last 3 Encounters:  11/06/23 221 lb 6.4 oz (100.4 kg)  08/21/23 224 lb 6.4 oz (101.8 kg)  06/13/23 238 lb 1.6 oz (108 kg)          No data to display            ASSESSMENT AND PLAN:  1.  Coronary artery disease involving native coronary arteries without angina: He is doing well post CABG with no anginal symptoms.  No aspirin given that he is on anticoagulation with Eliquis  2.  Chronic systolic heart failure: Likely due to ischemic cardiomyopathy.  Most recent echocardiogram showed improvement in ejection fraction to 45 to 50%.  Due to worsening renal function, he was taken off losartan and spironolactone.  Continue Toprol for now.  He appears to be euvolemic.    3.  Hyperlipidemia: Continue treatment with atorvastatin with a target LDL of less than 70.  4.  Atrial flutter: Status post ablation.  No evidence of recurrent arrhythmia.  5.  Status post pacemaker placement: Followed by Dr. Graciela Husbands.  6.  Extensive right lower extremity DVT: Continue anticoagulation with Eliquis.  In spite of his age and creatinine above 1.5, the 5 mg dose is the correct dose for DVT treatment.  However, after completing therapy, consider decreasing to 2.5 mg twice daily.    Disposition:   FU with me in 6 months  Signed,  Lorine Bears, MD  11/06/2023 2:39 PM    [facility]

## 2023-12-04 ENCOUNTER — Encounter (INDEPENDENT_AMBULATORY_CARE_PROVIDER_SITE_OTHER): Payer: Self-pay | Admitting: Vascular Surgery

## 2023-12-04 ENCOUNTER — Ambulatory Visit (INDEPENDENT_AMBULATORY_CARE_PROVIDER_SITE_OTHER): Payer: HMO | Admitting: Vascular Surgery

## 2023-12-04 VITALS — BP 115/65 | HR 71 | Resp 16 | Wt 229.0 lb

## 2023-12-04 DIAGNOSIS — E785 Hyperlipidemia, unspecified: Secondary | ICD-10-CM | POA: Diagnosis not present

## 2023-12-04 DIAGNOSIS — I10 Essential (primary) hypertension: Secondary | ICD-10-CM

## 2023-12-04 DIAGNOSIS — I824Y1 Acute embolism and thrombosis of unspecified deep veins of right proximal lower extremity: Secondary | ICD-10-CM | POA: Diagnosis not present

## 2023-12-04 MED ORDER — APIXABAN 5 MG PO TABS: 5.0000 mg | ORAL_TABLET | Freq: Two times a day (BID) | ORAL | 5 refills | Status: DC

## 2023-12-04 NOTE — Progress Notes (Signed)
 MRN : [id number]  Sean [name]. is a 83 y.o. (02/06/1941) male who presents with chief complaint of  Chief Complaint  Patient presents with   Follow-up    3 month follow up  .  History of Present Illness: Patient returns today in follow up of DVT.  His right leg swelling has gone down although not resolved.  He really is not having any pain.  No chest pain or shortness of breath.  He had right lower extremity venous thrombectomy with IVC filter placed about 4 months ago.  We discussed with he and his wife today consideration for removing that filter.  He is tolerating anticoagulation and we will remain on this for at least 1 year.  No current left leg symptoms.  Current Outpatient Medications  Medication Sig Dispense Refill   acetaminophen  (TYLENOL ) 500 MG tablet Take 500 mg by mouth every 6 (six) hours as needed for moderate pain or headache.     allopurinol  (ZYLOPRIM ) 100 MG tablet Take 100 mg by mouth every other day. At night     atorvastatin  (LIPITOR) 40 MG tablet TAKE ONE TABLET EVERY DAY 90 tablet 3   EPINEPHrine  (PRIMATENE  MIST) 0.125 MG/ACT AERO Inhale 1 puff into the lungs daily as needed (shortness of breath).     ferrous sulfate  325 (65 FE) MG EC tablet Take 325 mg by mouth 2 (two) times daily.     loratadine  (CLARITIN ) 10 MG tablet Take 1 tablet (10 mg total) by mouth daily. 90 tablet 3   metoprolol  succinate (TOPROL -XL) 25 MG 24 hr tablet TAKE 1 TABLET BY MOUTH DAILY (Patient taking differently: Take 12.5 mg by mouth daily. 12.5mg ) 90 tablet 0   Multiple Vitamin (MULTIVITAMIN WITH MINERALS) TABS tablet Take 1 tablet by mouth daily.     oxybutynin  (DITROPAN ) 5 MG tablet Take 1 tablet by mouth at bedtime.     Specialty Vitamins Products (PROSTATE PO) Take 1 tablet by mouth at bedtime.      tamsulosin  (FLOMAX ) 0.4 MG CAPS capsule Take 0.4 mg by mouth daily.     apixaban  (ELIQUIS ) 5 MG TABS tablet Take 1 tablet (5 mg total) by mouth 2 (two) times daily. 60 tablet 5   No  current facility-administered medications for this visit.    Past Medical History:  Diagnosis Date   Arthritis    Asthma    CAD (coronary artery disease)    a. LHC 7/19: ostLM 30%, ostLAD 30%, p-mLAD 99% mod calcified, ostD1 90%, ost-pLCx 80% ulcerative & mod calcified, p-mLCx 50%, OM2 50%, mod dz LPDA, RCA small w/ diffuse dz throughout; b. recommendation of CABG; c. 3-V CABG 08/26/18 (LIMA-LAD, VG-Diag, VG-LCx)   Chronic systolic CHF (congestive heart failure) (HCC)    a. 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 DVT of left subclavian dx'd 06/17/2018; b. Eliquis    GERD (gastroesophageal reflux disease)    takes otc occasionally   History of complete heart block    a. s/p MDT PPM 05/2017   Hypertension    Obesity    Presence of permanent cardiac pacemaker    Dr. KANDICE [place] implanted 05/2017   PVC's (premature ventricular contractions)    a. PPM-induced    Past  Surgical History:  Procedure Laterality Date   A-FLUTTER ABLATION N/A 03/03/2020   Procedure: A-FLUTTER ABLATION;  Surgeon: Fernande Elspeth BROCKS, MD;  Location: [facility] INVASIVE CV LAB;  Service: Cardiovascular;  Laterality: N/A;   BACK SURGERY     CORONARY ARTERY BYPASS GRAFT N/A 08/26/2018   Procedure: CORONARY ARTERY BYPASS GRAFTING (CABG) x3 , Using left internal mammory artery and right leg greater saphronious vein. Harvested endoscopically.;  Surgeon: Fleeta Hanford Coy, MD;  Location: [facility] OR;  Service: Open Heart Surgery;  Laterality: N/A;   EPICARDIAL PACING LEAD PLACEMENT N/A 08/26/2018   Procedure: LV PACING LEAD PLACEMENT;  Surgeon: Fleeta Hanford Coy, MD;  Location: [facility] OR;  Service: Thoracic;  Laterality: N/A;   EPICARDIAL PACING LEAD PLACEMENT Left 11/07/2018   Procedure: REVISE PACEMAKER LEAD LEFT  CHEST;  Surgeon: Fleeta Hanford Coy, MD;  Location: [facility] OR;  Service: Thoracic;  Laterality: Left;   INSERT / REPLACE / REMOVE PACEMAKER     [employer]   LEFT HEART CATH AND CORONARY ANGIOGRAPHY N/A 06/13/2018   Procedure: LEFT HEART CATH AND CORONARY ANGIOGRAPHY;  Surgeon: Darron Deatrice LABOR, MD;  Location: [facility] INVASIVE CV LAB;  Service: Cardiovascular;  Laterality: N/A;   LEG SURGERY     PACEMAKER IMPLANT N/A 06/11/2017   Procedure: Pacemaker Implant;  Surgeon: Waddell Danelle ORN, MD;  Location: [facility] INVASIVE CV LAB;  Service: Cardiovascular;  Laterality: N/A;   PERIPHERAL VASCULAR THROMBECTOMY Right 06/13/2023   Procedure: PERIPHERAL VASCULAR THROMBECTOMY;  Surgeon: Marea Selinda RAMAN, MD;  Location: [facility] INVASIVE CV LAB;  Service: Cardiovascular;  Laterality: Right;   PLEURAL EFFUSION DRAINAGE Left 09/24/2018   Procedure: DRAINAGE OF LOCULATED PLEURAL EFFUSION;  Surgeon: Fleeta Hanford Coy, MD;  Location: [facility] OR;  Service: Thoracic;  Laterality: Left;   TEE WITHOUT CARDIOVERSION N/A 08/26/2018   Procedure: TRANSESOPHAGEAL ECHOCARDIOGRAM (TEE);  Surgeon: Fleeta Hanford, Coy, MD;  Location: [facility] OR;  Service: Open Heart Surgery;  Laterality: N/A;   TEE WITHOUT CARDIOVERSION  03/03/2020   Procedure: Transesophageal Echocardiogram (Tee);  Surgeon: Fernande Elspeth BROCKS, MD;  Location: [facility] INVASIVE CV LAB;  Service: Cardiovascular;;   VIDEO ASSISTED THORACOSCOPY Left 09/24/2018   Procedure: VIDEO ASSISTED THORACOSCOPY;  Surgeon: Fleeta Hanford, Coy, MD;  Location: [facility] OR;  Service: Thoracic;  Laterality: Left;     Social History   Tobacco Use   Smoking status: Never   Smokeless tobacco: Never  Vaping Use   Vaping status: Never Used  Substance Use Topics   Alcohol use: Yes    Comment: occ.   Drug use: No      Family History  Problem Relation Age of Onset   Stroke Mother    Stroke Father    Alcohol abuse Father    Diabetes Brother    Alcohol abuse Brother    Stroke Maternal Grandfather      Allergies  Allergen  Reactions   Azithromycin Hives   Erythromycin Hives     REVIEW OF SYSTEMS (Negative unless checked)  Constitutional: [] Weight loss  [] Fever  [] Chills Cardiac: [] Chest pain   [] Chest pressure   [x] Palpitations   [] Shortness of breath when laying flat   [] Shortness of breath at rest   [] Shortness of breath with exertion. Vascular:  [] Pain in legs with walking   [] Pain in legs at rest   [] Pain in legs when laying flat   [] Claudication   [] Pain in feet when walking  [] Pain in feet at rest  [] Pain in feet when laying flat   [x] History of DVT   [  x]Phlebitis   [x] Swelling in legs   [] Varicose veins   [] Non-healing ulcers Pulmonary:   [] Uses home oxygen   [] Productive cough   [] Hemoptysis   [] Wheeze  [] COPD   [x] Asthma Neurologic:  [] Dizziness  [] Blackouts   [] Seizures   [] History of stroke   [] History of TIA  [] Aphasia   [] Temporary blindness   [] Dysphagia   [] Weakness or numbness in arms   [] Weakness or numbness in legs Musculoskeletal:  [x] Arthritis   [] Joint swelling   [x] Joint pain   [] Low back pain Hematologic:  [] Easy bruising  [] Easy bleeding   [] Hypercoagulable state   [] Anemic   Gastrointestinal:  [] Blood in stool   [] Vomiting blood  [x] Gastroesophageal reflux/heartburn   [] Abdominal pain Genitourinary:  [] Chronic kidney disease   [] Difficult urination  [] Frequent urination  [] Burning with urination   [] Hematuria Skin:  [] Rashes   [] Ulcers   [] Wounds Psychological:  [] History of anxiety   []  History of major depression.  Physical Examination  BP 115/65   Pulse 71   Resp 16   Wt 229 lb (103.9 kg)   BMI 32.39 kg/m  Gen:  WD/WN, NAD Head: [place]/AT, No temporalis wasting. Ear/Nose/Throat: Hearing grossly intact, nares w/o erythema or drainage Eyes: Conjunctiva clear. Sclera non-icteric Neck: Supple.  Trachea midline Pulmonary:  Good air movement, no use of accessory muscles.  Cardiac: Somewhat irregular Vascular:  Vessel Right Left  Radial Palpable Palpable                Musculoskeletal: M/S 5/5 throughout.  No deformity or atrophy. Mild RLE edema. Neurologic: Sensation grossly intact in extremities.  Symmetrical.  Speech is fluent.  Psychiatric: Judgment intact, Mood & affect appropriate for pt's clinical situation. Dermatologic: No rashes or ulcers noted.  No cellulitis or open wounds.      Labs Recent Results (from the past 2160 hours)  CUP PACEART REMOTE DEVICE CHECK     Status: None   Collection Time: 11/05/23  4:01 PM  Result Value Ref Range   Date Time Interrogation Session [phone]    Pulse Generator Manufacturer MERM    Pulse Gen Model W1DR01 Azure XT DR MRI    Pulse Gen Serial Number [id number]    Clinic [facility]    Implantable Pulse Generator Type Implantable Pulse Generator    Implantable Pulse Generator Implant Date 79819276    Implantable Lead Manufacturer [facility]    Implantable Lead Model 3830 SelectSecure    Implantable Lead Serial Number [id number]    Implantable Lead Implant Date 79819276    Implantable Lead Location Detail 1 UNKNOWN    Implantable Lead Special Function bundle of his    Implantable Lead Location [id number]    Implantable Lead Connection Status [id number]    Implantable Lead Manufacturer MERM    Implantable Lead Model 5076 CapSureFix Novus MRI SureScan    Implantable Lead Serial Number [id number]    Implantable Lead Implant Date 79819276    Implantable Lead Location Detail 1 APPENDAGE    Implantable Lead Location [id number]    Implantable Lead Connection Status [id number]    Lead Channel Setting Sensing Sensitivity 2.8 mV   Lead Channel Setting Pacing Amplitude 2 V   Lead Channel Setting Pacing Pulse Width 0.8 ms   Lead Channel Setting Pacing Amplitude 2.5 V   Zone Setting Status 755011    Zone Setting Status 755011    Lead Channel Impedance Value 342 ohm   Lead Channel Impedance Value 285 ohm   Lead Channel Sensing Intrinsic Amplitude  0.875 mV   Lead Channel Sensing Intrinsic Amplitude 0.875 mV   Lead  Channel Pacing Threshold Amplitude 0.875 V   Lead Channel Pacing Threshold Pulse Width 0.4 ms   Lead Channel Impedance Value 437 ohm   Lead Channel Impedance Value 342 ohm   Lead Channel Sensing Intrinsic Amplitude 5 mV   Lead Channel Sensing Intrinsic Amplitude 5 mV   Lead Channel Pacing Threshold Amplitude 1.25 V   Lead Channel Pacing Threshold Pulse Width 0.4 ms   Battery Status OK    Battery Remaining Longevity 11 mo   Battery Voltage 2.84 V   Brady Statistic RA Percent Paced 67.99 %   Brady Statistic RV Percent Paced 99.63 %   Brady Statistic AP VP Percent 67.97 %   Brady Statistic AS VP Percent 31.66 %   Brady Statistic AP VS Percent 0.05 %   Brady Statistic AS VS Percent 0.32 %    Radiology CUP PACEART REMOTE DEVICE CHECK Result Date: 11/06/2023 Scheduled remote reviewed. Normal device function.  Next remote 91 days. LA, CVRS   Assessment/Plan  Essential hypertension blood pressure control important in reducing the progression of atherosclerotic disease. On appropriate oral medications.   Dyslipidemia lipid control important in reducing the progression of atherosclerotic disease. Continue statin therapy   DVT, lower extremity, proximal, acute, right (HCC) Currently doing reasonably well after thrombectomy with filter placement and anticoagulation for extensive right lower extremity DVT.  Residual swelling is expected and not surprising given the extensive nature of the DVT and is best managed with compression socks, elevation, and activity.  At this point, we discussed potential IVC filter removal.  They have an upcoming wedding for his grandson and he would like to do it after that later this spring.  He will continue full anticoagulation and we discussed that the filter would be at risk of thrombosis if he were to stop anticoagulation with a filter in place.    Selinda Gu, MD  12/04/2023 4:30 PM    This note was created with Dragon medical transcription system.   Any errors from dictation are purely unintentional

## 2023-12-04 NOTE — Assessment & Plan Note (Signed)
 blood pressure control important in reducing the progression of atherosclerotic disease. On appropriate oral medications.

## 2023-12-04 NOTE — Assessment & Plan Note (Signed)
 Currently doing reasonably well after thrombectomy with filter placement and anticoagulation for extensive right lower extremity DVT.  Residual swelling is expected and not surprising given the extensive nature of the DVT and is best managed with compression socks, elevation, and activity.  At this point, we discussed potential IVC filter removal.  They have an upcoming wedding for his grandson and he would like to do it after that later this spring.  He will continue full anticoagulation and we discussed that the filter would be at risk of thrombosis if he were to stop anticoagulation with a filter in place.

## 2023-12-04 NOTE — Assessment & Plan Note (Signed)
 lipid control important in reducing the progression of atherosclerotic disease. Continue statin therapy

## 2023-12-11 NOTE — Progress Notes (Signed)
Remote pacemaker transmission.   

## 2023-12-11 NOTE — Addendum Note (Signed)
Addended by: Geralyn Flash D on: 12/11/2023 11:46 AM   Modules accepted: Orders

## 2023-12-17 DIAGNOSIS — I10 Essential (primary) hypertension: Secondary | ICD-10-CM | POA: Diagnosis not present

## 2023-12-17 DIAGNOSIS — N170 Acute kidney failure with tubular necrosis: Secondary | ICD-10-CM | POA: Diagnosis not present

## 2023-12-17 DIAGNOSIS — D509 Iron deficiency anemia, unspecified: Secondary | ICD-10-CM | POA: Diagnosis not present

## 2023-12-17 DIAGNOSIS — E875 Hyperkalemia: Secondary | ICD-10-CM | POA: Diagnosis not present

## 2023-12-17 DIAGNOSIS — N1832 Chronic kidney disease, stage 3b: Secondary | ICD-10-CM | POA: Diagnosis not present

## 2023-12-17 DIAGNOSIS — N281 Cyst of kidney, acquired: Secondary | ICD-10-CM | POA: Diagnosis not present

## 2023-12-20 DIAGNOSIS — N281 Cyst of kidney, acquired: Secondary | ICD-10-CM | POA: Diagnosis not present

## 2023-12-20 DIAGNOSIS — N184 Chronic kidney disease, stage 4 (severe): Secondary | ICD-10-CM | POA: Diagnosis not present

## 2023-12-20 DIAGNOSIS — E1122 Type 2 diabetes mellitus with diabetic chronic kidney disease: Secondary | ICD-10-CM | POA: Diagnosis not present

## 2023-12-20 DIAGNOSIS — I10 Essential (primary) hypertension: Secondary | ICD-10-CM | POA: Diagnosis not present

## 2023-12-20 DIAGNOSIS — R2241 Localized swelling, mass and lump, right lower limb: Secondary | ICD-10-CM | POA: Diagnosis not present

## 2023-12-27 DIAGNOSIS — N184 Chronic kidney disease, stage 4 (severe): Secondary | ICD-10-CM | POA: Diagnosis not present

## 2024-01-14 ENCOUNTER — Other Ambulatory Visit: Payer: Self-pay | Admitting: Internal Medicine

## 2024-01-14 DIAGNOSIS — E785 Hyperlipidemia, unspecified: Secondary | ICD-10-CM | POA: Diagnosis not present

## 2024-01-14 DIAGNOSIS — I10 Essential (primary) hypertension: Secondary | ICD-10-CM | POA: Diagnosis not present

## 2024-01-14 DIAGNOSIS — Z125 Encounter for screening for malignant neoplasm of prostate: Secondary | ICD-10-CM | POA: Diagnosis not present

## 2024-01-14 DIAGNOSIS — E119 Type 2 diabetes mellitus without complications: Secondary | ICD-10-CM | POA: Diagnosis not present

## 2024-01-16 DIAGNOSIS — I10 Essential (primary) hypertension: Secondary | ICD-10-CM | POA: Diagnosis not present

## 2024-01-16 DIAGNOSIS — D631 Anemia in chronic kidney disease: Secondary | ICD-10-CM | POA: Diagnosis not present

## 2024-01-16 DIAGNOSIS — N400 Enlarged prostate without lower urinary tract symptoms: Secondary | ICD-10-CM | POA: Diagnosis not present

## 2024-01-16 DIAGNOSIS — N184 Chronic kidney disease, stage 4 (severe): Secondary | ICD-10-CM | POA: Diagnosis not present

## 2024-01-16 DIAGNOSIS — I2510 Atherosclerotic heart disease of native coronary artery without angina pectoris: Secondary | ICD-10-CM | POA: Diagnosis not present

## 2024-01-16 DIAGNOSIS — I442 Atrioventricular block, complete: Secondary | ICD-10-CM | POA: Diagnosis not present

## 2024-01-16 DIAGNOSIS — R972 Elevated prostate specific antigen [PSA]: Secondary | ICD-10-CM | POA: Diagnosis not present

## 2024-01-16 DIAGNOSIS — E785 Hyperlipidemia, unspecified: Secondary | ICD-10-CM | POA: Diagnosis not present

## 2024-01-25 ENCOUNTER — Emergency Department

## 2024-01-25 ENCOUNTER — Other Ambulatory Visit: Payer: Self-pay

## 2024-01-25 ENCOUNTER — Inpatient Hospital Stay
Admission: EM | Admit: 2024-01-25 | Discharge: 2024-02-02 | DRG: 698 | Disposition: A | Discharge status: Home Health | Attending: Hospitalist | Admitting: Hospitalist

## 2024-01-25 DIAGNOSIS — I493 Ventricular premature depolarization: Secondary | ICD-10-CM | POA: Diagnosis present

## 2024-01-25 DIAGNOSIS — E871 Hypo-osmolality and hyponatremia: Secondary | ICD-10-CM | POA: Diagnosis not present

## 2024-01-25 DIAGNOSIS — Z79899 Other long term (current) drug therapy: Secondary | ICD-10-CM

## 2024-01-25 DIAGNOSIS — R338 Other retention of urine: Secondary | ICD-10-CM

## 2024-01-25 DIAGNOSIS — Z86718 Personal history of other venous thrombosis and embolism: Secondary | ICD-10-CM

## 2024-01-25 DIAGNOSIS — N400 Enlarged prostate without lower urinary tract symptoms: Secondary | ICD-10-CM | POA: Diagnosis present

## 2024-01-25 DIAGNOSIS — I4892 Unspecified atrial flutter: Secondary | ICD-10-CM | POA: Diagnosis present

## 2024-01-25 DIAGNOSIS — Z833 Family history of diabetes mellitus: Secondary | ICD-10-CM

## 2024-01-25 DIAGNOSIS — A419 Sepsis, unspecified organism: Secondary | ICD-10-CM | POA: Diagnosis present

## 2024-01-25 DIAGNOSIS — R4182 Altered mental status, unspecified: Secondary | ICD-10-CM

## 2024-01-25 DIAGNOSIS — R6520 Severe sepsis without septic shock: Secondary | ICD-10-CM | POA: Insufficient documentation

## 2024-01-25 DIAGNOSIS — N179 Acute kidney failure, unspecified: Secondary | ICD-10-CM | POA: Diagnosis present

## 2024-01-25 DIAGNOSIS — Z86711 Personal history of pulmonary embolism: Secondary | ICD-10-CM | POA: Insufficient documentation

## 2024-01-25 DIAGNOSIS — N390 Urinary tract infection, site not specified: Secondary | ICD-10-CM

## 2024-01-25 DIAGNOSIS — B9620 Unspecified Escherichia coli [E. coli] as the cause of diseases classified elsewhere: Secondary | ICD-10-CM | POA: Diagnosis not present

## 2024-01-25 DIAGNOSIS — K5900 Constipation, unspecified: Secondary | ICD-10-CM | POA: Diagnosis present

## 2024-01-25 DIAGNOSIS — I5042 Chronic combined systolic (congestive) and diastolic (congestive) heart failure: Secondary | ICD-10-CM | POA: Diagnosis present

## 2024-01-25 DIAGNOSIS — I130 Hypertensive heart and chronic kidney disease with heart failure and stage 1 through stage 4 chronic kidney disease, or unspecified chronic kidney disease: Secondary | ICD-10-CM | POA: Diagnosis present

## 2024-01-25 DIAGNOSIS — Z811 Family history of alcohol abuse and dependence: Secondary | ICD-10-CM

## 2024-01-25 DIAGNOSIS — Z960 Presence of urogenital implants: Secondary | ICD-10-CM | POA: Diagnosis present

## 2024-01-25 DIAGNOSIS — G9340 Encephalopathy, unspecified: Secondary | ICD-10-CM

## 2024-01-25 DIAGNOSIS — Z823 Family history of stroke: Secondary | ICD-10-CM

## 2024-01-25 DIAGNOSIS — I2510 Atherosclerotic heart disease of native coronary artery without angina pectoris: Secondary | ICD-10-CM | POA: Diagnosis present

## 2024-01-25 DIAGNOSIS — E1122 Type 2 diabetes mellitus with diabetic chronic kidney disease: Secondary | ICD-10-CM | POA: Diagnosis not present

## 2024-01-25 DIAGNOSIS — T83511A Infection and inflammatory reaction due to indwelling urethral catheter, initial encounter: Secondary | ICD-10-CM | POA: Diagnosis not present

## 2024-01-25 DIAGNOSIS — E872 Acidosis, unspecified: Secondary | ICD-10-CM | POA: Diagnosis present

## 2024-01-25 DIAGNOSIS — Z66 Do not resuscitate: Secondary | ICD-10-CM | POA: Diagnosis not present

## 2024-01-25 DIAGNOSIS — N281 Cyst of kidney, acquired: Secondary | ICD-10-CM | POA: Diagnosis not present

## 2024-01-25 DIAGNOSIS — Y846 Urinary catheterization as the cause of abnormal reaction of the patient, or of later complication, without mention of misadventure at the time of the procedure: Secondary | ICD-10-CM | POA: Diagnosis present

## 2024-01-25 DIAGNOSIS — Z1152 Encounter for screening for COVID-19: Secondary | ICD-10-CM

## 2024-01-25 DIAGNOSIS — N184 Chronic kidney disease, stage 4 (severe): Secondary | ICD-10-CM | POA: Diagnosis present

## 2024-01-25 DIAGNOSIS — E860 Dehydration: Secondary | ICD-10-CM | POA: Diagnosis not present

## 2024-01-25 DIAGNOSIS — J189 Pneumonia, unspecified organism: Secondary | ICD-10-CM | POA: Diagnosis not present

## 2024-01-25 DIAGNOSIS — J45909 Unspecified asthma, uncomplicated: Secondary | ICD-10-CM | POA: Diagnosis present

## 2024-01-25 DIAGNOSIS — N189 Chronic kidney disease, unspecified: Secondary | ICD-10-CM | POA: Diagnosis not present

## 2024-01-25 DIAGNOSIS — K219 Gastro-esophageal reflux disease without esophagitis: Secondary | ICD-10-CM | POA: Diagnosis present

## 2024-01-25 DIAGNOSIS — M159 Polyosteoarthritis, unspecified: Secondary | ICD-10-CM | POA: Diagnosis present

## 2024-01-25 DIAGNOSIS — E785 Hyperlipidemia, unspecified: Secondary | ICD-10-CM | POA: Diagnosis present

## 2024-01-25 DIAGNOSIS — I82511 Chronic embolism and thrombosis of right femoral vein: Secondary | ICD-10-CM | POA: Diagnosis not present

## 2024-01-25 DIAGNOSIS — Z950 Presence of cardiac pacemaker: Secondary | ICD-10-CM

## 2024-01-25 DIAGNOSIS — I10 Essential (primary) hypertension: Secondary | ICD-10-CM | POA: Diagnosis present

## 2024-01-25 DIAGNOSIS — M7989 Other specified soft tissue disorders: Secondary | ICD-10-CM | POA: Diagnosis not present

## 2024-01-25 DIAGNOSIS — D72829 Elevated white blood cell count, unspecified: Secondary | ICD-10-CM | POA: Diagnosis not present

## 2024-01-25 DIAGNOSIS — R339 Retention of urine, unspecified: Secondary | ICD-10-CM | POA: Diagnosis present

## 2024-01-25 DIAGNOSIS — R059 Cough, unspecified: Secondary | ICD-10-CM | POA: Diagnosis not present

## 2024-01-25 DIAGNOSIS — E66811 Obesity, class 1: Secondary | ICD-10-CM | POA: Diagnosis not present

## 2024-01-25 DIAGNOSIS — I129 Hypertensive chronic kidney disease with stage 1 through stage 4 chronic kidney disease, or unspecified chronic kidney disease: Secondary | ICD-10-CM | POA: Diagnosis not present

## 2024-01-25 DIAGNOSIS — Z951 Presence of aortocoronary bypass graft: Secondary | ICD-10-CM

## 2024-01-25 DIAGNOSIS — I6782 Cerebral ischemia: Secondary | ICD-10-CM | POA: Diagnosis not present

## 2024-01-25 DIAGNOSIS — N3001 Acute cystitis with hematuria: Secondary | ICD-10-CM | POA: Diagnosis not present

## 2024-01-25 DIAGNOSIS — Z881 Allergy status to other antibiotic agents status: Secondary | ICD-10-CM

## 2024-01-25 DIAGNOSIS — M109 Gout, unspecified: Secondary | ICD-10-CM | POA: Diagnosis not present

## 2024-01-25 DIAGNOSIS — Z7901 Long term (current) use of anticoagulants: Secondary | ICD-10-CM

## 2024-01-25 DIAGNOSIS — Z6832 Body mass index (BMI) 32.0-32.9, adult: Secondary | ICD-10-CM

## 2024-01-25 DIAGNOSIS — D631 Anemia in chronic kidney disease: Secondary | ICD-10-CM | POA: Diagnosis not present

## 2024-01-25 DIAGNOSIS — Z91128 Patient's intentional underdosing of medication regimen for other reason: Secondary | ICD-10-CM

## 2024-01-25 DIAGNOSIS — G9341 Metabolic encephalopathy: Secondary | ICD-10-CM | POA: Diagnosis present

## 2024-01-25 DIAGNOSIS — N178 Other acute kidney failure: Secondary | ICD-10-CM | POA: Diagnosis not present

## 2024-01-25 DIAGNOSIS — R531 Weakness: Secondary | ICD-10-CM | POA: Diagnosis not present

## 2024-01-25 DIAGNOSIS — R0689 Other abnormalities of breathing: Secondary | ICD-10-CM | POA: Diagnosis not present

## 2024-01-25 HISTORY — DX: Urinary tract infection, site not specified: N39.0

## 2024-01-25 HISTORY — DX: Encephalopathy, unspecified: G93.40

## 2024-01-25 LAB — COMPREHENSIVE METABOLIC PANEL
ALT: 18 U/L (ref 0–44)
AST: 30 U/L (ref 15–41)
Albumin: 3.8 g/dL (ref 3.5–5.0)
Alkaline Phosphatase: 54 U/L (ref 38–126)
Anion gap: 13 (ref 5–15)
BUN: 73 mg/dL — ABNORMAL HIGH (ref 8–23)
CO2: 22 mmol/L (ref 22–32)
Calcium: 9.9 mg/dL (ref 8.9–10.3)
Chloride: 95 mmol/L — ABNORMAL LOW (ref 98–111)
Creatinine, Ser: 4.76 mg/dL — ABNORMAL HIGH (ref 0.61–1.24)
GFR, Estimated: 12 mL/min — ABNORMAL LOW (ref >60)
Glucose, Bld: 144 mg/dL — ABNORMAL HIGH (ref 70–99)
Potassium: 4.8 mmol/L (ref 3.5–5.1)
Sodium: 130 mmol/L — ABNORMAL LOW (ref 135–145)
Total Bilirubin: 1.8 mg/dL — ABNORMAL HIGH (ref 0.0–1.2)
Total Protein: 6.7 g/dL (ref 6.5–8.1)

## 2024-01-25 LAB — LACTIC ACID, PLASMA
Lactic Acid, Venous: 2.2 mmol/L (ref 0.5–1.9)
Lactic Acid, Venous: 2.2 mmol/L (ref 0.5–1.9)

## 2024-01-25 LAB — CBC WITH DIFFERENTIAL/PLATELET
Abs Immature Granulocytes: 3.63 10*3/uL — ABNORMAL HIGH (ref 0.00–0.07)
Basophils Absolute: 0.1 10*3/uL (ref 0.0–0.1)
Basophils Relative: 0 %
Eosinophils Absolute: 0.2 10*3/uL (ref 0.0–0.5)
Eosinophils Relative: 1 %
HCT: 31.5 % — ABNORMAL LOW (ref 39.0–52.0)
Hemoglobin: 10.7 g/dL — ABNORMAL LOW (ref 13.0–17.0)
Immature Granulocytes: 11 %
Lymphocytes Relative: 2 %
Lymphs Abs: 0.5 10*3/uL — ABNORMAL LOW (ref 0.7–4.0)
MCH: 32.7 pg (ref 26.0–34.0)
MCHC: 34 g/dL (ref 30.0–36.0)
MCV: 96.3 fL (ref 80.0–100.0)
Monocytes Absolute: 1.9 10*3/uL — ABNORMAL HIGH (ref 0.1–1.0)
Monocytes Relative: 6 %
Neutro Abs: 26.5 10*3/uL — ABNORMAL HIGH (ref 1.7–7.7)
Neutrophils Relative %: 80 %
Platelets: 145 10*3/uL — ABNORMAL LOW (ref 150–400)
RBC: 3.27 MIL/uL — ABNORMAL LOW (ref 4.22–5.81)
RDW: 13.3 % (ref 11.5–15.5)
Smear Review: NORMAL
WBC: 32.8 10*3/uL — ABNORMAL HIGH (ref 4.0–10.5)
nRBC: 0 % (ref 0.0–0.2)

## 2024-01-25 LAB — URINALYSIS, W/ REFLEX TO CULTURE (INFECTION SUSPECTED)
Bilirubin Urine: NEGATIVE
Glucose, UA: NEGATIVE mg/dL
Ketones, ur: NEGATIVE mg/dL
Nitrite: NEGATIVE
Protein, ur: NEGATIVE mg/dL
Specific Gravity, Urine: 1.008 (ref 1.005–1.030)
Squamous Epithelial / HPF: 0 /HPF (ref 0–5)
WBC, UA: >50 WBC/hpf (ref 0–5)
pH: 5 (ref 5.0–8.0)

## 2024-01-25 LAB — RESP PANEL BY RT-PCR (RSV, FLU A&B, COVID)  RVPGX2
Influenza A by PCR: NEGATIVE
Influenza B by PCR: NEGATIVE
Resp Syncytial Virus by PCR: NEGATIVE
SARS Coronavirus 2 by RT PCR: NEGATIVE

## 2024-01-25 MED ORDER — SODIUM CHLORIDE 0.9 % IV SOLN
2.0000 g | Freq: Once | INTRAVENOUS | Status: AC
  Administered 2024-01-25: 2 g via INTRAVENOUS
  Filled 2024-01-25: qty 20

## 2024-01-25 MED ORDER — LOSARTAN POTASSIUM 25 MG PO TABS: 12.5000 mg | ORAL_TABLET | Freq: Every day | ORAL | Status: DC

## 2024-01-25 MED ORDER — ATORVASTATIN CALCIUM 20 MG PO TABS
40.0000 mg | ORAL_TABLET | Freq: Every day | ORAL | Status: DC
  Administered 2024-01-25 – 2024-02-01 (×8): 40 mg via ORAL
  Filled 2024-01-25 (×8): qty 2

## 2024-01-25 MED ORDER — EPINEPHRINE 0.125 MG/ACT IN AERO: 1.0000 | INHALATION_SPRAY | Freq: Every day | RESPIRATORY_TRACT | Status: DC | PRN

## 2024-01-25 MED ORDER — TRAZODONE HCL 50 MG PO TABS
25.0000 mg | ORAL_TABLET | Freq: Every evening | ORAL | Status: DC | PRN
  Administered 2024-01-27 – 2024-02-01 (×4): 25 mg via ORAL
  Filled 2024-01-25 (×4): qty 1

## 2024-01-25 MED ORDER — OXYBUTYNIN CHLORIDE ER 10 MG PO TB24
10.0000 mg | ORAL_TABLET | Freq: Every day | ORAL | Status: DC
  Administered 2024-01-26 – 2024-02-02 (×8): 10 mg via ORAL
  Filled 2024-01-25 (×8): qty 1

## 2024-01-25 MED ORDER — SODIUM CHLORIDE 0.9 % IV SOLN
2.0000 g | INTRAVENOUS | Status: DC
  Administered 2024-01-26 – 2024-01-28 (×3): 2 g via INTRAVENOUS
  Filled 2024-01-25 (×3): qty 20

## 2024-01-25 MED ORDER — MAGNESIUM HYDROXIDE 400 MG/5ML PO SUSP: 30.0000 mL | Freq: Every day | ORAL | Status: DC | PRN

## 2024-01-25 MED ORDER — SODIUM CHLORIDE 0.9 % IV SOLN: INTRAVENOUS | Status: DC

## 2024-01-25 MED ORDER — SPIRONOLACTONE 12.5 MG HALF TABLET: 12.5000 mg | ORAL_TABLET | Freq: Every day | ORAL | Status: DC

## 2024-01-25 MED ORDER — METOPROLOL SUCCINATE ER 25 MG PO TB24
25.0000 mg | ORAL_TABLET | Freq: Every day | ORAL | Status: DC
  Administered 2024-01-26 – 2024-02-02 (×8): 25 mg via ORAL
  Filled 2024-01-25 (×8): qty 1

## 2024-01-25 MED ORDER — LORATADINE 10 MG PO TABS
10.0000 mg | ORAL_TABLET | Freq: Every day | ORAL | Status: DC
  Administered 2024-01-26 – 2024-02-02 (×8): 10 mg via ORAL
  Filled 2024-01-25 (×8): qty 1

## 2024-01-25 MED ORDER — FERROUS SULFATE 325 (65 FE) MG PO TABS
325.0000 mg | ORAL_TABLET | Freq: Two times a day (BID) | ORAL | Status: DC
  Administered 2024-01-26 – 2024-02-02 (×15): 325 mg via ORAL
  Filled 2024-01-25 (×15): qty 1

## 2024-01-25 MED ORDER — SODIUM CHLORIDE 0.9 % IV BOLUS
1000.0000 mL | Freq: Once | INTRAVENOUS | Status: AC
  Administered 2024-01-25: 1000 mL via INTRAVENOUS

## 2024-01-25 MED ORDER — PANTOPRAZOLE SODIUM 40 MG IV SOLR: 40.0000 mg | Freq: Two times a day (BID) | INTRAVENOUS | Status: DC

## 2024-01-25 MED ORDER — ALBUTEROL SULFATE (2.5 MG/3ML) 0.083% IN NEBU: 2.5000 mg | INHALATION_SOLUTION | Freq: Four times a day (QID) | RESPIRATORY_TRACT | Status: DC | PRN

## 2024-01-25 MED ORDER — PANTOPRAZOLE SODIUM 40 MG PO TBEC
40.0000 mg | DELAYED_RELEASE_TABLET | Freq: Every day | ORAL | Status: DC
  Administered 2024-01-25 – 2024-01-28 (×4): 40 mg via ORAL
  Filled 2024-01-25 (×4): qty 1

## 2024-01-25 MED ORDER — LACTATED RINGERS IV BOLUS
1000.0000 mL | Freq: Once | INTRAVENOUS | Status: AC
  Administered 2024-01-25: 1000 mL via INTRAVENOUS

## 2024-01-25 MED ORDER — APIXABAN 5 MG PO TABS
5.0000 mg | ORAL_TABLET | Freq: Two times a day (BID) | ORAL | Status: DC
  Administered 2024-01-25 – 2024-02-02 (×16): 5 mg via ORAL
  Filled 2024-01-25 (×16): qty 1

## 2024-01-25 MED ORDER — LABETALOL HCL 5 MG/ML IV SOLN
20.0000 mg | INTRAVENOUS | Status: DC | PRN
  Administered 2024-01-31: 20 mg via INTRAVENOUS
  Filled 2024-01-25: qty 4

## 2024-01-25 MED ORDER — ONDANSETRON HCL 4 MG PO TABS
4.0000 mg | ORAL_TABLET | Freq: Four times a day (QID) | ORAL | Status: DC | PRN
Start: 2024-01-25 — End: 2024-02-02

## 2024-01-25 MED ORDER — ACETAMINOPHEN 650 MG RE SUPP: 650.0000 mg | Freq: Four times a day (QID) | RECTAL | Status: DC | PRN

## 2024-01-25 MED ORDER — ONDANSETRON HCL 4 MG/2ML IJ SOLN
4.0000 mg | Freq: Four times a day (QID) | INTRAMUSCULAR | Status: DC | PRN
  Administered 2024-01-25: 4 mg via INTRAVENOUS
  Filled 2024-01-25: qty 2

## 2024-01-25 MED ORDER — ALLOPURINOL 100 MG PO TABS
100.0000 mg | ORAL_TABLET | Freq: Every day | ORAL | Status: DC
  Administered 2024-01-25 – 2024-02-01 (×8): 100 mg via ORAL
  Filled 2024-01-25 (×9): qty 1

## 2024-01-25 MED ORDER — ADULT MULTIVITAMIN W/MINERALS CH
1.0000 | ORAL_TABLET | Freq: Every day | ORAL | Status: DC
  Administered 2024-01-26 – 2024-02-02 (×8): 1 via ORAL
  Filled 2024-01-25 (×8): qty 1

## 2024-01-25 MED ORDER — ACETAMINOPHEN 325 MG PO TABS: 650.0000 mg | ORAL_TABLET | Freq: Four times a day (QID) | ORAL | Status: DC | PRN

## 2024-01-25 MED ORDER — TAMSULOSIN HCL 0.4 MG PO CAPS
0.4000 mg | ORAL_CAPSULE | Freq: Every day | ORAL | Status: DC
  Administered 2024-01-26 – 2024-02-02 (×8): 0.4 mg via ORAL
  Filled 2024-01-25 (×8): qty 1

## 2024-01-25 NOTE — H&P (Signed)
 [place]   PATIENT NAME: Sean [name]    MR#:  [id number]  DATE OF BIRTH:  11-18-1941  DATE OF ADMISSION:  01/25/2024  PRIMARY CARE PHYSICIAN: Jerl Mina, MD   Patient is coming from: Home  REQUESTING/REFERRING PHYSICIAN: Claudell Kyle, MD  CHIEF COMPLAINT:   Chief Complaint  Patient presents with   Weakness    Since wednesday    HISTORY OF PRESENT ILLNESS:  Jahrel Borthwick. is a 83 y.o. Caucasian male with medical history significant for osteoarthritis, asthma, coronary artery disease, chronic systolic CHF, GERD, stage III CKD, essential hypertension and obesity, who presented to the emergency room with acute onset of generalized weakness.  The patient was wife was giving history due to his altered mental status.  She stated that he has been having worsening confusion and generalized weakness for the last 24 hours.  He has not been taking his medications as prescribed.  The patient was unsure of what is going on.  He was alert and oriented only to his name.  He admitted to chest congestion and mild cough without wheezing.  No nausea or vomiting or diarrhea or abdominal pain at home.  Had an episode of vomiting in the ER that improved with IV Zofran and what looked like coffee-ground emesis that came back with negative Gastroccult.Marland Kitchen  No chest pain or palpitations.  No dysuria, oliguria or hematuria.  He admits to urinary frequency without urgency or flank pain.  No bleeding diathesis.  No fever or chills.  ED Course: When he came to the ER, temperature was 100.3/37.9 with otherwise normal vital signs.  Labs revealed lactic acid 2.2 and later the same, CBC with leukocytosis at 32.8 compared to 15.5 on 06/16/2023 and hemoglobin 10.7 hematocrit 31.5 above previous levels with platelets of 145 compared to 183 then.  It showed neutrophilia.  Respiratory panel came back negative. EKG as reviewed by me : EKG showed ventricular paced rhythm with a rate of 88. Imaging: 2 view chest  x-ray showed no acute cardiopulmonary disease.   Noncontrast head CT scan revealed no acute intracranial normality.  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  Showed chronic microvascular ischemic changes and parenchymal volume loss.  The patient was given 1 L bolus of IV normal saline and 1 L bolus of IV lactated ringer and 2 g of IV Rocephin.  He will be admitted to a medical telemetry bed for further evaluation and management. PAST MEDICAL HISTORY:   Past Medical History:  Diagnosis Date   Arthritis    Asthma    CAD (coronary artery disease)    a. LHC 7/19: ostLM 30%, ostLAD 30%, p-mLAD 99% mod calcified, ostD1 90%, ost-pLCx 80% ulcerative & mod calcified, p-mLCx 50%, OM2 50%, mod dz LPDA, RCA small w/ diffuse dz throughout; b. recommendation of CABG; c. 3-V CABG 08/26/18 (LIMA-LAD, VG-Diag, VG-LCx)   Chronic systolic CHF (congestive heart failure) (HCC)    a. 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 DVT of left subclavian dx'd 06/17/2018; b. Eliquis   GERD (gastroesophageal reflux disease)    takes otc occasionally  History of complete heart block    a. s/p MDT PPM 05/2017   Hypertension    Obesity    Presence of permanent cardiac pacemaker    Dr. Rosette Reveal implanted 05/2017   PVC's (premature ventricular contractions)    a. PPM-induced    PAST SURGICAL HISTORY:   Past Surgical History:  Procedure Laterality Date   A-FLUTTER ABLATION N/A 03/03/2020   Procedure: A-FLUTTER ABLATION;  Surgeon: Duke Salvia, MD;  Location: [facility] INVASIVE CV LAB;  Service: Cardiovascular;  Laterality: N/A;   BACK SURGERY     CORONARY ARTERY BYPASS GRAFT N/A 08/26/2018    Procedure: CORONARY ARTERY BYPASS GRAFTING (CABG) x3 , Using left internal mammory artery and right leg greater saphronious vein. Harvested endoscopically.;  Surgeon: Kerin Perna, MD;  Location: [facility] OR;  Service: Open Heart Surgery;  Laterality: N/A;   EPICARDIAL PACING LEAD PLACEMENT N/A 08/26/2018   Procedure: LV PACING LEAD PLACEMENT;  Surgeon: Kerin Perna, MD;  Location: [facility] OR;  Service: Thoracic;  Laterality: N/A;   EPICARDIAL PACING LEAD PLACEMENT Left 11/07/2018   Procedure: REVISE PACEMAKER LEAD LEFT CHEST;  Surgeon: Kerin Perna, MD;  Location: [facility] OR;  Service: Thoracic;  Laterality: Left;   INSERT / REPLACE / REMOVE PACEMAKER     [employer]   LEFT HEART CATH AND CORONARY ANGIOGRAPHY N/A 06/13/2018   Procedure: LEFT HEART CATH AND CORONARY ANGIOGRAPHY;  Surgeon: Iran Ouch, MD;  Location: [facility] INVASIVE CV LAB;  Service: Cardiovascular;  Laterality: N/A;   LEG SURGERY     PACEMAKER IMPLANT N/A 06/11/2017   Procedure: Pacemaker Implant;  Surgeon: Marinus Maw, MD;  Location: [facility] INVASIVE CV LAB;  Service: Cardiovascular;  Laterality: N/A;   PERIPHERAL VASCULAR THROMBECTOMY Right 06/13/2023   Procedure: PERIPHERAL VASCULAR THROMBECTOMY;  Surgeon: Annice Needy, MD;  Location: [facility] INVASIVE CV LAB;  Service: Cardiovascular;  Laterality: Right;   PLEURAL EFFUSION DRAINAGE Left 09/24/2018   Procedure: DRAINAGE OF LOCULATED PLEURAL EFFUSION;  Surgeon: Kerin Perna, MD;  Location: [facility] OR;  Service: Thoracic;  Laterality: Left;   TEE WITHOUT CARDIOVERSION N/A 08/26/2018   Procedure: TRANSESOPHAGEAL ECHOCARDIOGRAM (TEE);  Surgeon: Donata Clay, Theron Arista, MD;  Location: [facility] OR;  Service: Open Heart Surgery;  Laterality: N/A;   TEE WITHOUT CARDIOVERSION  03/03/2020   Procedure: Transesophageal Echocardiogram (Tee);  Surgeon: Duke Salvia, MD;  Location: [facility] INVASIVE CV LAB;  Service: Cardiovascular;;   VIDEO ASSISTED THORACOSCOPY Left 09/24/2018   Procedure: VIDEO ASSISTED THORACOSCOPY;   Surgeon: Donata Clay, Theron Arista, MD;  Location: [facility] OR;  Service: Thoracic;  Laterality: Left;    SOCIAL HISTORY:   Social History   Tobacco Use   Smoking status: Never   Smokeless tobacco: Never  Substance Use Topics   Alcohol use: Yes    Comment: occ.    FAMILY HISTORY:   Family History  Problem Relation Age of Onset   Stroke Mother    Stroke Father    Alcohol abuse Father    Diabetes Brother    Alcohol abuse Brother    Stroke Maternal Grandfather     DRUG ALLERGIES:   Allergies  Allergen Reactions   Azithromycin Hives   Erythromycin Hives    REVIEW OF SYSTEMS:   ROS As per history of present illness. All pertinent systems were reviewed above. Constitutional, HEENT, cardiovascular, respiratory, GI, GU, musculoskeletal, neuro, psychiatric, endocrine, integumentary and hematologic systems were reviewed and are otherwise negative/unremarkable except for positive findings mentioned above in the HPI.  MEDICATIONS AT HOME:   Prior to Admission medications   Medication Sig Start Date End Date Taking? Authorizing Provider  acetaminophen (TYLENOL) 500 MG tablet Take 500 mg by mouth every 6 (six) hours as needed for moderate pain or headache.   Yes [provider]  allopurinol (ZYLOPRIM) 100 MG tablet Take 100 mg by mouth daily. At night   Yes [provider]  apixaban (ELIQUIS) 5 MG TABS tablet Take 1 tablet (5 mg total) by mouth 2 (two) times daily. 12/04/23  Yes Annice Needy, MD  atorvastatin (LIPITOR) 40 MG tablet TAKE ONE TABLET EVERY DAY 01/14/24  Yes Duke Salvia, MD  EPINEPHrine (PRIMATENE MIST) 0.125 MG/ACT AERO Inhale 1 puff into the lungs daily as needed (shortness of breath).   Yes [provider]  ferrous sulfate 325 (65 FE) MG EC tablet Take 325 mg by mouth 2 (two) times daily.   Yes [provider]  loratadine (CLARITIN) 10 MG tablet Take 1 tablet (10 mg total) by mouth daily. 05/04/23  Yes Hammock, Sheri, NP  losartan (COZAAR)  25 MG tablet Take 12.5 mg by mouth daily.   Yes [provider]  metoprolol succinate (TOPROL-XL) 25 MG 24 hr tablet TAKE 1 TABLET BY MOUTH DAILY 08/20/23  Yes Iran Ouch, MD  Multiple Vitamin (MULTIVITAMIN WITH MINERALS) TABS tablet Take 1 tablet by mouth daily.   Yes [provider]  oxybutynin (DITROPAN-XL) 10 MG 24 hr tablet Take 10 mg by mouth daily. 01/07/24  Yes [provider]  Specialty Vitamins Products (PROSTATE PO) Take 1 tablet by mouth at bedtime.    Yes [provider]  spironolactone (ALDACTONE) 25 MG tablet Take 12.5 mg by mouth daily.   Yes [provider]  tamsulosin (FLOMAX) 0.4 MG CAPS capsule Take 0.4 mg by mouth daily. 09/15/20  Yes [provider]  oxybutynin (DITROPAN) 5 MG tablet Take 1 tablet by mouth at bedtime. Patient not taking: Reported on 01/25/2024 07/28/23   [provider]      VITAL SIGNS:  Blood pressure (!) 164/91, pulse 93, temperature 98.8 F (37.1 C), resp. rate 12, height 5\' 10"  (1.778 m), SpO2 98%.  PHYSICAL EXAMINATION:  Physical Exam  GENERAL:  83 y.o.-year-old Caucasian male patient lying in the bed with no acute distress.  EYES: Pupils equal, round, reactive to light and accommodation. No scleral icterus. Extraocular muscles intact.  HEENT: Head atraumatic, normocephalic. Oropharynx and nasopharynx clear.  NECK:  Supple, no jugular venous distention. No thyroid enlargement, no tenderness.  LUNGS: Normal breath sounds bilaterally, no wheezing, rales,rhonchi or crepitation. No use of accessory muscles of respiration.  CARDIOVASCULAR: Regular rate and rhythm, S1, S2 normal. No murmurs, rubs, or gallops.  ABDOMEN: Soft, nondistended, nontender. Bowel sounds present. No organomegaly or mass.  EXTREMITIES: No pedal edema, cyanosis, or clubbing.  NEUROLOGIC: Cranial nerves II through XII are intact. Muscle strength 5/5 in all extremities. Sensation intact. Gait not checked.   PSYCHIATRIC: The patient is alert and oriented x 3.  Normal affect and good eye contact. SKIN: No obvious rash, lesion, or ulcer.   LABORATORY PANEL:   CBC Recent Labs  Lab 01/25/24 1516  WBC 32.8*  HGB 10.7*  HCT 31.5*  PLT 145*   ------------------------------------------------------------------------------------------------------------------  Chemistries  Recent Labs  Lab 01/25/24 1516  NA 130*  K 4.8  CL 95*  CO2 22  GLUCOSE 144*  BUN 73*  CREATININE 4.76*  CALCIUM 9.9  AST 30  ALT 18  ALKPHOS  54  BILITOT 1.8*   ------------------------------------------------------------------------------------------------------------------  Cardiac Enzymes No results for input(s): "TROPONINI" in the last 168 hours. ------------------------------------------------------------------------------------------------------------------  RADIOLOGY:  DG Chest 2 View Result Date: 01/25/2024 CLINICAL DATA:  Cough EXAM: CHEST - 2 VIEW COMPARISON:  06/12/2023 FINDINGS: Lungs are clear. No pneumothorax or pleural effusion. Cardiac size within normal limits. Left subclavian dual lead pacemaker is in place with leads in expected position. Additional detached epicardial pacer lead noted. Coronary artery bypass grafting has been performed. Cardiac size within normal limits. Pulmonary vascularity is normal. No acute bone abnormality. IMPRESSION: 1. No active cardiopulmonary disease. Electronically Signed   By: Helyn Numbers M.D.   On: 01/25/2024 19:43   CT Head Wo Contrast Result Date: 01/25/2024 CLINICAL DATA:  Stroke, unusual behavior since Wednesday. EXAM: CT HEAD WITHOUT CONTRAST TECHNIQUE: Contiguous axial images were obtained from the base of the skull through the vertex without intravenous contrast. RADIATION DOSE REDUCTION: This exam was performed according to the departmental dose-optimization program which includes automated exposure control, adjustment of the mA and/or kV according to  patient size and/or use of iterative reconstruction technique. COMPARISON:  CT head 06/26/2018 FINDINGS: Brain: No acute intracranial hemorrhage. No CT evidence of acute infarct. 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 Nonspecific hypoattenuation in the periventricular and subcortical white matter favored to reflect chronic microvascular ischemic changes. No edema, mass effect, or midline shift. The basilar cisterns are patent. Ventricles: Prominence of the ventricles suggesting underlying parenchymal volume loss. Vascular: Atherosclerotic calcifications of the carotid siphons and intracranial vertebral arteries. No hyperdense vessel. Skull: No acute or aggressive finding. Orbits: Orbits are symmetric. Sinuses: Mild scattered mucosal thickening in the ethmoid and maxillary sinuses with possible mucous retention cysts in the left maxillary sinus. Other: Mastoid air cells are clear. IMPRESSION: 1. No acute intracranial abnormality. 2. 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 3. Chronic microvascular ischemic changes and parenchymal volume loss. Electronically Signed   By: Emily Filbert M.D.   On: 01/25/2024 19:28      IMPRESSION AND PLAN:  Assessment and Plan: * Acute kidney injury superimposed on chronic kidney disease (HCC) - This is likely prerenal due to volume depletion and dehydration. - The patient will be admitted to a medical telemetry bed. - We will continue hydration with IV normal saline. - We will follow BMP. - We will hold off nephrotoxins.  Acute lower UTI - We will continue antibiotic therapy with IV Rocephin. - We will follow urine culture and sensitivity.  Acute encephalopathy - This is associated with generalized weakness. - Both are likely secondary to #1 and #2. - Management as above. - We will follow  neurochecks every 4 hours for 24 hours. - We will continue to monitor mental status.  Leukocytosis - This is likely secondary to his UTI is an early leukemoid reaction. - We will follow leukocytosis with current antibiotic management. - This could be partly due to stress demargination as well.  Essential hypertension - We will continue antihypertensives while holding off nephrotoxins.  Dyslipidemia - We will continue statin therapy.  BPH (benign prostatic hyperplasia) - We will continue Flomax.  Gout - We will continue allopurinol.  Paroxysmal atrial flutter (HCC) - We will continue Toprol-XL and Eliquis.  History of pulmonary embolism - We will continue Eliquis.   DVT prophylaxis: Eliquis.  Advanced  Care Planning:  Code Status: The patient is DNR only. Family Communication:  The plan of care was discussed in details with the patient (and family). I answered all questions. The patient agreed to proceed with the above mentioned plan. Further management will depend upon hospital course. Disposition Plan: Back to previous home environment Consults called: none.  All the records are reviewed and case discussed with ED provider.  Status is: Inpatient  At the time of the admission, it appears that the appropriate admission status for this patient is inpatient.  This is judged to be reasonable and necessary in order to provide the required intensity of service to ensure the patient's safety given the presenting symptoms, physical exam findings and initial radiographic and laboratory data in the context of comorbid conditions.  The patient requires inpatient status due to high intensity of service, high risk of further deterioration and high frequency of surveillance required.  I certify that at the time of admission, it is my clinical judgment that the patient will require inpatient hospital care extending more than 2 midnights.                            Dispo: The patient is from:  Home              Anticipated d/c is to: Home              Patient currently is not medically stable to d/c.              Difficult to place patient: No  Hannah Beat M.D on 01/25/2024 at 10:58 PM  [facility]   From 7 PM-7 AM, contact night-coverage [email or web address]  CC: Primary care physician; Jerl Mina, MD

## 2024-01-25 NOTE — Assessment & Plan Note (Signed)
 -  We will continue Eliquis.

## 2024-01-25 NOTE — Assessment & Plan Note (Signed)
-   This is likely secondary to his UTI is an early leukemoid reaction. - We will follow leukocytosis with current antibiotic management. - This could be partly due to stress demargination as well.

## 2024-01-25 NOTE — Assessment & Plan Note (Addendum)
-   This is associated with generalized weakness. - Both are likely secondary to #1 and #2. - Management as above. - We will follow neurochecks every 4 hours for 24 hours. - We will continue to monitor mental status.

## 2024-01-25 NOTE — ED Notes (Signed)
 Blue top and RED top sent to lab.

## 2024-01-25 NOTE — Assessment & Plan Note (Signed)
-   We will continue Toprol-XL and Eliquis. 

## 2024-01-25 NOTE — Assessment & Plan Note (Signed)
-   We will continue antihypertensives while holding off nephrotoxins.

## 2024-01-25 NOTE — ED Provider Notes (Signed)
 [facility] Provider Note    Event Date/Time   First MD Initiated Contact with Patient 01/25/24 1725     (approximate)   History   Weakness (Since wednesday)   HPI Sean Bartnick. is a 83 y.o. male with history of CKD, HTN, HLD, prior CABG presenting today for weakness.  Patient unable to provide any significant history on his own given altered mental status.  Wife provides history.  She states over the past 24 hours he has had increasing weakness and altered mental status.  Not been taking medications as prescribed and unsure of what is going on.  He is only able to state his name.  She notes he has had a mild cough and congestion recently.  Unsure of any changes in his urination.  He has not been complaining of any pain.  No nausea, vomiting, or diarrhea.     Physical Exam   Triage Vital Signs: ED Triage Vitals  Encounter Vitals Group     BP 01/25/24 1508 119/63     Systolic BP Percentile --      Diastolic BP Percentile --      Pulse Rate 01/25/24 1508 88     Resp 01/25/24 1508 18     Temp 01/25/24 1508 100.3 F (37.9 C)     Temp Source 01/25/24 1508 Oral     SpO2 01/25/24 1508 94 %     Weight --      Height 01/25/24 1509 5\' 10"  (1.778 m)     Head Circumference --      Peak Flow --      Pain Score 01/25/24 1509 0     Pain Loc --      Pain Education --      Exclude from Growth Chart --     Most recent vital signs: Vitals:   01/25/24 1930 01/25/24 1945  BP: (!) 169/100   Pulse: 84 87  Resp: 17 13  Temp:  98.8 F (37.1 C)  SpO2: 98% 98%   Physical Exam: I have reviewed the vital signs and nursing notes. General: Awake, alert, no acute distress.  Pleasantly confused. Head:  Atraumatic, normocephalic.   ENT:  EOM intact, PERRL. Oral mucosa is pink and moist with no lesions. Neck: Neck is supple with full range of motion, No meningeal signs. Cardiovascular:  RRR, No murmurs. Peripheral pulses palpable and equal  bilaterally. Respiratory:  Symmetrical chest wall expansion.  No rhonchi, rales, or wheezes.  Good air movement throughout.  No use of accessory muscles.   Musculoskeletal:  No cyanosis or edema. Moving extremities with full ROM Abdomen:  Soft, nontender, nondistended. Neuro:  GCS 14, confusion, oriented only to name, moving all four extremities, interacting appropriately. Speech clear. Psych:  Calm, appropriate.   Skin:  Warm, dry, no rash.    ED Results / Procedures / Treatments   Labs (all labs ordered are listed, but only abnormal results are displayed) Labs Reviewed  LACTIC ACID, PLASMA - Abnormal; Notable for the following components:      Result Value   Lactic Acid, Venous 2.2 (*)    All other components within normal limits  LACTIC ACID, PLASMA - Abnormal; Notable for the following components:   Lactic Acid, Venous 2.2 (*)    All other components within normal limits  COMPREHENSIVE METABOLIC PANEL - Abnormal; Notable for the following components:   Sodium 130 (*)    Chloride 95 (*)    Glucose, Bld 144 (*)  BUN 73 (*)    Creatinine, Ser 4.76 (*)    Total Bilirubin 1.8 (*)    GFR, Estimated 12 (*)    All other components within normal limits  CBC WITH DIFFERENTIAL/PLATELET - Abnormal; Notable for the following components:   WBC 32.8 (*)    RBC 3.27 (*)    Hemoglobin 10.7 (*)    HCT 31.5 (*)    Platelets 145 (*)    Neutro Abs 26.5 (*)    Lymphs Abs 0.5 (*)    Monocytes Absolute 1.9 (*)    Abs Immature Granulocytes 3.63 (*)    All other components within normal limits  URINALYSIS, W/ REFLEX TO CULTURE (INFECTION SUSPECTED) - Abnormal; Notable for the following components:   Color, Urine YELLOW (*)    APPearance CLOUDY (*)    Hgb urine dipstick MODERATE (*)    Leukocytes,Ua LARGE (*)    Bacteria, UA MANY (*)    All other components within normal limits  RESP PANEL BY RT-PCR (RSV, FLU A&B, COVID)  [id number]  URINE CULTURE     EKG My EKG interpretation: Rate of  88, ventricular paced rhythm.  No acute ST elevations or depressions   RADIOLOGY Independently interpreted CT head and chest x-ray with no acute pathology   PROCEDURES:  Critical Care performed: Yes, see critical care procedure note(s)  .Critical Care  Performed by: Sean Lima, MD Authorized by: Sean Lima, MD   Critical care provider statement:    Critical care time (minutes):  30   Critical care was necessary to treat or prevent imminent or life-threatening deterioration of the following conditions:  Renal failure   Critical care was time spent personally by me on the following activities:  Development of treatment plan with patient or surrogate, discussions with consultants, evaluation of patient's response to treatment, examination of patient, ordering and review of laboratory studies, ordering and review of radiographic studies, ordering and performing treatments and interventions, pulse oximetry, re-evaluation of patient's condition and review of old charts   I assumed direction of critical care for this patient from another provider in my specialty: no     Care discussed with: admitting provider      MEDICATIONS ORDERED IN ED: Medications  sodium chloride 0.9 % bolus 1,000 mL (has no administration in time range)  cefTRIAXone (ROCEPHIN) 2 g in sodium chloride 0.9 % 100 mL IVPB (0 g Intravenous Stopped 01/25/24 1846)  lactated ringers bolus 1,000 mL (0 mLs Intravenous Stopped 01/25/24 1944)     IMPRESSION / MDM / ASSESSMENT AND PLAN / ED COURSE  I reviewed the triage vital signs and the nursing notes.                              Differential diagnosis includes, but is not limited to, dehydration, UTI, COVID, flu, RSV, pneumonia  Patient's presentation is most consistent with acute presentation with potential threat to life or bodily function.  Patient is an 83 year old male presenting today for acute encephalopathy and weakness.  Vital signs stable on arrival and  exam unremarkable aside from altered mental status and only able to state his name.  Otherwise calm.  Laboratory workup notable for severely elevated leukocytosis at 32.8.  He also has worsening AKI on his CKD.  Ultimately tested positive for UTI which I suspect is the source of his symptoms today.  Patient given 1 L fluids and ceftriaxone.  Lactic acid mildly elevated  2.2.  Will give additional liter of fluids and admit to hospitalist for ongoing treatment.  As I was going to admit him and tell family, they also noted some swelling to his right lower extremity.  It does not seem significantly edematous compared to the left side but with his history of a blood clot, will get ultrasound to further evaluate.  The patient is on the cardiac monitor to evaluate for evidence of arrhythmia and/or significant heart rate changes.     FINAL CLINICAL IMPRESSION(S) / ED DIAGNOSES   Final diagnoses:  Acute cystitis with hematuria  AKI (acute kidney injury) (HCC)  Altered mental status, unspecified altered mental status type     Rx / DC Orders   ED Discharge Orders     None        Note:  This document was prepared using Dragon voice recognition software and may include unintentional dictation errors.   Sean Lima, MD 01/25/24 Corky Crafts

## 2024-01-25 NOTE — Assessment & Plan Note (Signed)
-   This is likely prerenal due to volume depletion and dehydration. - The patient will be admitted to a medical telemetry bed. - We will continue hydration with IV normal saline. - We will follow BMP. - We will hold off nephrotoxins.

## 2024-01-25 NOTE — ED Notes (Signed)
 Lactic 2.2 informed to MD Anner Crete

## 2024-01-25 NOTE — Assessment & Plan Note (Signed)
-   We will continue antibiotic therapy with IV Rocephin. - We will follow urine culture and sensitivity.

## 2024-01-25 NOTE — ED Notes (Signed)
 RN to bedside to answer call light. Pt had episode of emesis. Emesis was dark red in color with coffee ground appearance. MD made aware.

## 2024-01-25 NOTE — ED Triage Notes (Addendum)
 Pt to ed from home via [facility] for stroke symptoms. Wife called for unusual behavior since Wednesday.   Meets sepsis screening for EMS. Strong urine smell. HX of blood clot in his lung  159/80 BGL 150 99 T 20 RHand.   Pt is caox4 and in no acute distress. Per wife he was refusing to take his meds and not eat.

## 2024-01-25 NOTE — Assessment & Plan Note (Signed)
 -  We will continue statin therapy.

## 2024-01-25 NOTE — ED Notes (Signed)
 GASTROCCULT = NEGATIVE; DR [facility] AWARE

## 2024-01-25 NOTE — Assessment & Plan Note (Signed)
 -  We will continue Flomax

## 2024-01-25 NOTE — Assessment & Plan Note (Signed)
 -  We will continue allopurinol

## 2024-01-26 ENCOUNTER — Inpatient Hospital Stay

## 2024-01-26 DIAGNOSIS — I10 Essential (primary) hypertension: Secondary | ICD-10-CM | POA: Diagnosis not present

## 2024-01-26 DIAGNOSIS — G9340 Encephalopathy, unspecified: Secondary | ICD-10-CM | POA: Diagnosis not present

## 2024-01-26 DIAGNOSIS — N179 Acute kidney failure, unspecified: Secondary | ICD-10-CM | POA: Diagnosis not present

## 2024-01-26 DIAGNOSIS — N189 Chronic kidney disease, unspecified: Secondary | ICD-10-CM | POA: Diagnosis not present

## 2024-01-26 LAB — CBC
HCT: 28 % — ABNORMAL LOW (ref 39.0–52.0)
Hemoglobin: 9.6 g/dL — ABNORMAL LOW (ref 13.0–17.0)
MCH: 33.3 pg (ref 26.0–34.0)
MCHC: 34.3 g/dL (ref 30.0–36.0)
MCV: 97.2 fL (ref 80.0–100.0)
Platelets: 129 10*3/uL — ABNORMAL LOW (ref 150–400)
RBC: 2.88 MIL/uL — ABNORMAL LOW (ref 4.22–5.81)
RDW: 13.2 % (ref 11.5–15.5)
WBC: 31.2 10*3/uL — ABNORMAL HIGH (ref 4.0–10.5)
nRBC: 0 % (ref 0.0–0.2)

## 2024-01-26 LAB — BASIC METABOLIC PANEL
Anion gap: 10 (ref 5–15)
BUN: 79 mg/dL — ABNORMAL HIGH (ref 8–23)
CO2: 23 mmol/L (ref 22–32)
Calcium: 8.9 mg/dL (ref 8.9–10.3)
Chloride: 98 mmol/L (ref 98–111)
Creatinine, Ser: 4.97 mg/dL — ABNORMAL HIGH (ref 0.61–1.24)
GFR, Estimated: 11 mL/min — ABNORMAL LOW (ref >60)
Glucose, Bld: 142 mg/dL — ABNORMAL HIGH (ref 70–99)
Potassium: 4.8 mmol/L (ref 3.5–5.1)
Sodium: 131 mmol/L — ABNORMAL LOW (ref 135–145)

## 2024-01-26 MED ORDER — ORAL CARE MOUTH RINSE: 15.0000 mL | OROMUCOSAL | Status: DC | PRN

## 2024-01-26 MED ORDER — CALCIUM CARBONATE ANTACID 500 MG PO CHEW
1.0000 | CHEWABLE_TABLET | Freq: Three times a day (TID) | ORAL | Status: DC | PRN
  Administered 2024-01-26 – 2024-01-28 (×2): 200 mg via ORAL
  Filled 2024-01-26 (×2): qty 1

## 2024-01-26 NOTE — Plan of Care (Signed)

## 2024-01-26 NOTE — Progress Notes (Addendum)
 [facility]  PROGRESS NOTE  Sean [name]. ZOX:[id number] DOB: 1941-03-09 DOA: 01/25/2024 PCP: Jerl Mina, MD   Brief HPI:     83 y.o. Caucasian male with medical history significant for osteoarthritis, asthma, coronary artery disease, chronic systolic CHF, GERD, stage III CKD, essential hypertension and obesity, who presented to the emergency room with acute onset of generalized weakness.  The patient was wife was giving history due to his altered mental status.  She stated that he has been having worsening confusion and generalized weakness for the last 24 hours.    Assessment/Plan:    Acute kidney injury superimposed on chronic kidney disease (HCC) -Presented with creatinine of 4.76, BUN 73; baseline creatinine 2.86 as of 06/13/2023 -Will obtain renal ultrasound -Bladder scan obtained showed urine greater than 999 mL -Will place Foley catheter  Acute lower UTI - We will continue antibiotic therapy with IV Rocephin. - We will follow urine culture and sensitivity.   Acute encephalopathy - This is associated with generalized weakness. - Both are likely secondary to #1 and #2. - Management as above. - We will follow neurochecks every 4 hours for 24 hours. - We will continue to monitor mental status.   Leukocytosis - This is likely secondary to his UTI is an early leukemoid reaction. - We will follow leukocytosis with current antibiotic management. - This could be partly due to stress demargination as well.   Essential hypertension - We will continue antihypertensives while holding off nephrotoxins.   Dyslipidemia - We will continue statin therapy.   BPH (benign prostatic hyperplasia) - We will continue Flomax. -Check renal ultrasound   Gout - We will continue allopurinol.   Paroxysmal atrial flutter (HCC) - We will continue Toprol-XL and Eliquis.   History of pulmonary embolism - We will continue Eliquis.    Medications     allopurinol  100 mg Oral  Daily   apixaban  5 mg Oral BID   atorvastatin  40 mg Oral Daily   ferrous sulfate  325 mg Oral BID WC   loratadine  10 mg Oral Daily   metoprolol succinate  25 mg Oral Daily   multivitamin with minerals  1 tablet Oral Daily   oxybutynin  10 mg Oral Daily   pantoprazole  40 mg Oral Daily   tamsulosin  0.4 mg Oral Daily     Data Reviewed:   CBG:  No results for input(s): "GLUCAP" in the last 168 hours.  SpO2: 96 %    Vitals:   01/25/24 2158 01/25/24 2242 01/26/24 0329 01/26/24 0911  BP: (!) 164/91 (!) 158/96 125/76 (!) 151/84  Pulse: 93 92 87 81  Resp: 12 20 20 18   Temp:  98 F (36.7 C) 98.2 F (36.8 C) 98.1 F (36.7 C)  TempSrc:  Oral Oral Oral  SpO2: 98% 100% 94% 96%  Height:          Data Reviewed:  Basic Metabolic Panel: Recent Labs  Lab 01/25/24 1516 01/26/24 0448  NA 130* 131*  K 4.8 4.8  CL 95* 98  CO2 22 23  GLUCOSE 144* 142*  BUN 73* 79*  CREATININE 4.76* 4.97*  CALCIUM 9.9 8.9    CBC: Recent Labs  Lab 01/25/24 1516 01/26/24 0448  WBC 32.8* 31.2*  NEUTROABS 26.5*  --   HGB 10.7* 9.6*  HCT 31.5* 28.0*  MCV 96.3 97.2  PLT 145* 129*    LFT Recent Labs  Lab 01/25/24 1516  AST 30  ALT 18  ALKPHOS 54  BILITOT 1.8*  PROT 6.7  ALBUMIN 3.8     Antibiotics: Anti-infectives (From admission, onward)    Start     Dose/Rate Route Frequency Ordered Stop   01/26/24 1000  cefTRIAXone (ROCEPHIN) 2 g in sodium chloride 0.9 % 100 mL IVPB        2 g 200 mL/hr over 30 Minutes Intravenous Every 24 hours 01/25/24 2053     01/25/24 1745  cefTRIAXone (ROCEPHIN) 2 g in sodium chloride 0.9 % 100 mL IVPB        2 g 200 mL/hr over 30 Minutes Intravenous  Once 01/25/24 1736 01/25/24 1846        DVT prophylaxis: Apixaban  Code Status: DNR  Family Communication: Discussed with patient's wife at bedside   CONSULTS    Subjective   Denies any complaints.   Objective    Physical Examination:  General-appears in no acute  distress Heart-S1-S2, regular, no murmur auscultated Lungs-clear to auscultation bilaterally, no wheezing or crackles auscultated Abdomen-soft, nontender, no organomegaly Extremities-no edema in the lower extremities Neuro-alert, oriented x3, no focal deficit noted   Status is: Inpatient:             Meredeth Ide   [facility] If 7PM-7AM, please contact night-coverage at [email or web address], Office  [phone]   01/26/2024, 9:40 AM  LOS: 1 day

## 2024-01-26 NOTE — Progress Notes (Signed)
 The urine was yellow when I placed the foley. It is now pink/red in color. I only had a little of resistance when I placed it. the leg anchor is in place so I don't think the patient has pulled on the foley catheter. MD notified. No new orders at this time

## 2024-01-26 NOTE — Progress Notes (Signed)
 Bladder scan >850 before voiding. Patient immediately went to the bathroom.  >999 on post void bladder scan. MD [place] notified. Order received for coude catheter

## 2024-01-26 NOTE — Plan of Care (Signed)

## 2024-01-27 DIAGNOSIS — E66811 Obesity, class 1: Secondary | ICD-10-CM | POA: Insufficient documentation

## 2024-01-27 DIAGNOSIS — A419 Sepsis, unspecified organism: Secondary | ICD-10-CM | POA: Insufficient documentation

## 2024-01-27 DIAGNOSIS — N178 Other acute kidney failure: Secondary | ICD-10-CM | POA: Diagnosis not present

## 2024-01-27 DIAGNOSIS — R6520 Severe sepsis without septic shock: Secondary | ICD-10-CM | POA: Diagnosis not present

## 2024-01-27 DIAGNOSIS — G9340 Encephalopathy, unspecified: Secondary | ICD-10-CM | POA: Diagnosis not present

## 2024-01-27 DIAGNOSIS — N184 Chronic kidney disease, stage 4 (severe): Secondary | ICD-10-CM

## 2024-01-27 LAB — CBC
HCT: 27.3 % — ABNORMAL LOW (ref 39.0–52.0)
Hemoglobin: 9.3 g/dL — ABNORMAL LOW (ref 13.0–17.0)
MCH: 33 pg (ref 26.0–34.0)
MCHC: 34.1 g/dL (ref 30.0–36.0)
MCV: 96.8 fL (ref 80.0–100.0)
Platelets: 133 10*3/uL — ABNORMAL LOW (ref 150–400)
RBC: 2.82 MIL/uL — ABNORMAL LOW (ref 4.22–5.81)
RDW: 13 % (ref 11.5–15.5)
WBC: 21.4 10*3/uL — ABNORMAL HIGH (ref 4.0–10.5)
nRBC: 0 % (ref 0.0–0.2)

## 2024-01-27 LAB — COMPREHENSIVE METABOLIC PANEL
ALT: 18 U/L (ref 0–44)
AST: 23 U/L (ref 15–41)
Albumin: 2.5 g/dL — ABNORMAL LOW (ref 3.5–5.0)
Alkaline Phosphatase: 49 U/L (ref 38–126)
Anion gap: 8 (ref 5–15)
BUN: 83 mg/dL — ABNORMAL HIGH (ref 8–23)
CO2: 23 mmol/L (ref 22–32)
Calcium: 8.5 mg/dL — ABNORMAL LOW (ref 8.9–10.3)
Chloride: 101 mmol/L (ref 98–111)
Creatinine, Ser: 4.87 mg/dL — ABNORMAL HIGH (ref 0.61–1.24)
GFR, Estimated: 11 mL/min — ABNORMAL LOW (ref >60)
Glucose, Bld: 132 mg/dL — ABNORMAL HIGH (ref 70–99)
Potassium: 4.6 mmol/L (ref 3.5–5.1)
Sodium: 132 mmol/L — ABNORMAL LOW (ref 135–145)
Total Bilirubin: 0.7 mg/dL (ref 0.0–1.2)
Total Protein: 5.3 g/dL — ABNORMAL LOW (ref 6.5–8.1)

## 2024-01-27 MED ORDER — CHLORHEXIDINE GLUCONATE CLOTH 2 % EX PADS
6.0000 | MEDICATED_PAD | Freq: Every day | CUTANEOUS | Status: DC
  Administered 2024-01-27 – 2024-02-02 (×7): 6 via TOPICAL

## 2024-01-27 MED ORDER — SODIUM CHLORIDE 0.9 % IV SOLN: INTRAVENOUS | Status: AC

## 2024-01-27 NOTE — Hospital Course (Signed)
 Sean [name]. is a 83 y.o. Caucasian male with medical history significant for osteoarthritis, asthma, coronary artery disease, chronic systolic CHF, GERD, stage III CKD, essential hypertension and obesity, who presented to the emergency room with acute onset of generalized weakness altered mental status. He had temperature 100.3, severe leukocytosis 32.8, lactic acid 2.2, she also has worsening renal function. Patient also has abnormal urine, diagnosed with urinary tract infection.  Placed on IV fluids and antibiotics.

## 2024-01-27 NOTE — Progress Notes (Signed)
 Progress Note   Patient: Sean [name]. ZOX:[id number] DOB: [birth year] DOA: 01/25/2024     2 DOS: the patient was seen and examined on 01/27/2024   Brief hospital course: Sean [name]. is a 83 y.o. Caucasian male with medical history significant for osteoarthritis, asthma, coronary artery disease, chronic systolic CHF, GERD, stage III CKD, essential hypertension and obesity, who presented to the emergency room with acute onset of generalized weakness altered mental status. He had temperature 100.3, severe leukocytosis 32.8, lactic acid 2.2, she also has worsening renal function. Patient also has abnormal urine, diagnosed with urinary tract infection.  Placed on IV fluids and antibiotics.   Principal Problem:   Acute renal failure superimposed on stage 4 chronic kidney disease (HCC) Active Problems:   Acute lower UTI   Acute encephalopathy   Essential hypertension   Leukocytosis   Dyslipidemia   BPH (benign prostatic hyperplasia)   Gout   Coronary artery disease   Atrial flutter (HCC)   History of pulmonary embolus (PE)   Paroxysmal atrial flutter (HCC)   Severe sepsis (HCC)   Class 1 obesity   Assessment and Plan:  * Acute renal failure superimposed on stage 4 chronic kidney disease (HCC) Hyponatremia. Chart, patient has chronic kidney stage IV.  Renal function much worse due to severe sepsis.  Bladder scan did not show any urinary tension, he has a chronic Foley catheter since July last year. Continue IV fluids, follow-up renal function.  Severe sepsis secondary to urinary tract infection. Acute metabolic cephalopathy secondary to urinary tract infection. Urinary tract infection secondary to indwelling Foley catheter. Benign prostatic hypertrophy with the Foley catheter. Patient has a chronic Foley catheter since July last year.  Followed by urology as outpatient.  Bladder scan showed 0 residual. Patient has significant altered mental status time of admission. Patient  also had fever, severe leukocytosis, lactic acidosis and worsening renal function.  Condition consistent with severe sepsis. Blood culture and urine cultures pending. Continue Rocephin for now.   Essential hypertension Continue home medicines, blood pressure not significant elevated.  Dyslipidemia - We will continue statin therapy.  BPH (benign prostatic hyperplasia) - We will continue Flomax.  Gout - We will continue allopurinol.  Paroxysmal atrial flutter (HCC) - We will continue Toprol-XL and Eliquis.  History of pulmonary embolus (PE) - We will continue Eliquis.  Coronary artery disease. Chronic combined systolic diastolic congestive heart failure. Conditions are stable.      Subjective:  Patient does not remember what happened yesterday.  Currently no fever, no nausea vomiting.  No diarrhea.  Physical Exam: Vitals:   01/26/24 1528 01/26/24 1532 01/26/24 2022 01/27/24 0826  BP: (!) 173/87 (!) 168/78 (!) 149/81 125/72  Pulse: 86  70 69  Resp: 20  20 16   Temp: 97.8 F (36.6 C)  97.9 F (36.6 C) 98.5 F (36.9 C)  TempSrc: Oral  Oral Oral  SpO2: 98%  98% 97%  Weight:      Height:       General exam: Appears calm and comfortable  Respiratory system: Clear to auscultation. Respiratory effort normal. Cardiovascular system: Regular. No JVD, murmurs, rubs, gallops or clicks. No pedal edema. Gastrointestinal system: Abdomen is nondistended, soft and nontender. No organomegaly or masses felt. Normal bowel sounds heard. Central nervous system: Alert and oriented x2. No focal neurological deficits. Extremities: Symmetric 5 x 5 power. Skin: No rashes, lesions or ulcers Psychiatry:  Mood & affect appropriate.    Data Reviewed:  Lab results reviewed.  Renal ultrasound showed a distended bladder, but bladder scan showed no residual.   Family Communication: Wife updated over the phone.  Disposition: Status is: Inpatient Remains inpatient appropriate because:  Severity of disease, IV treatment.     Time spent: 50 minutes  Author: Marrion Coy, MD 01/27/2024 11:24 AM  For on call review [email or web address].

## 2024-01-28 DIAGNOSIS — G9340 Encephalopathy, unspecified: Secondary | ICD-10-CM | POA: Diagnosis not present

## 2024-01-28 DIAGNOSIS — N178 Other acute kidney failure: Secondary | ICD-10-CM | POA: Diagnosis not present

## 2024-01-28 DIAGNOSIS — N390 Urinary tract infection, site not specified: Secondary | ICD-10-CM | POA: Diagnosis not present

## 2024-01-28 DIAGNOSIS — A419 Sepsis, unspecified organism: Secondary | ICD-10-CM | POA: Diagnosis not present

## 2024-01-28 LAB — BASIC METABOLIC PANEL
Anion gap: 10 (ref 5–15)
BUN: 81 mg/dL — ABNORMAL HIGH (ref 8–23)
CO2: 24 mmol/L (ref 22–32)
Calcium: 8.5 mg/dL — ABNORMAL LOW (ref 8.9–10.3)
Chloride: 101 mmol/L (ref 98–111)
Creatinine, Ser: 4.91 mg/dL — ABNORMAL HIGH (ref 0.61–1.24)
GFR, Estimated: 11 mL/min — ABNORMAL LOW (ref >60)
Glucose, Bld: 119 mg/dL — ABNORMAL HIGH (ref 70–99)
Potassium: 4.5 mmol/L (ref 3.5–5.1)
Sodium: 135 mmol/L (ref 135–145)

## 2024-01-28 LAB — URINE CULTURE: Culture: >=100000 — AB

## 2024-01-28 MED ORDER — CEPHALEXIN 500 MG PO CAPS
500.0000 mg | ORAL_CAPSULE | Freq: Two times a day (BID) | ORAL | Status: DC
  Administered 2024-01-29 – 2024-01-30 (×3): 500 mg via ORAL
  Filled 2024-01-28 (×3): qty 1

## 2024-01-28 MED ORDER — PANTOPRAZOLE SODIUM 40 MG PO TBEC
40.0000 mg | DELAYED_RELEASE_TABLET | Freq: Two times a day (BID) | ORAL | Status: DC
  Administered 2024-01-28 – 2024-02-02 (×10): 40 mg via ORAL
  Filled 2024-01-28 (×10): qty 1

## 2024-01-28 MED ORDER — SODIUM CHLORIDE 0.9 % IV SOLN: 1.0000 g | INTRAVENOUS | Status: DC

## 2024-01-28 MED ORDER — SODIUM CHLORIDE 0.9 % IV SOLN: 2.0000 g | INTRAVENOUS | Status: DC

## 2024-01-28 NOTE — Plan of Care (Signed)

## 2024-01-28 NOTE — Progress Notes (Signed)
 [facility]  ROUNDING NOTE   Subjective:   Sean [name]. Is a 83 y.o. male with past medical history of CHF, diabetes, CAD s/p CABG, hypertension and chornic kidney disease stage IV.  Patient presents to the [facility] department with altered mental status and weakness and has been admitted for Acute cystitis with hematuria [N30.01] AKI (acute kidney injury) (HCC) [N17.9] Altered mental status, unspecified altered mental status type [R41.82] Acute kidney injury superimposed on chronic kidney disease (HCC) [N17.9, N18.9]  Patient is known to our practice from previous admission and is followed in our office by Dr. Suezanne Jacquet.  Patient is seen resting in bed, wife at bedside.  Patient is alert and oriented to self and place.  Wife states weakness and confusion began the day prior to arrival.  She reports no new medications or recent infections.  Does report mild constipation.  Appetite has decreased.  Denies nausea or vomiting.  It appears patient vomited in emergency department and received IV Zofran.  Labs on ED arrival concerning for sodium 130, BUN 73, creatinine 4.76 with GFR 12, lactic acid 2.2, elevated white count 32.8 with hemoglobin 10.7.  Respiratory panel negative for influenza, COVID-19, and RSV.  UA appears cloudy with leukocytes.  E. coli noted on urine culture.  Chest x-ray shows no acute findings.  Head CT also negative for acute findings.  Renal ultrasound negative for obstruction, simple cyst noted.  We have been consulted to manage acute kidney injury.   Objective:  Vital signs in last 24 hours:  Temp:  [97.8 F (36.6 C)-99.3 F (37.4 C)] 98.1 F (36.7 C) (03/10 1509) Pulse Rate:  [69-73] 71 (03/10 1509) Resp:  [16-18] 18 (03/10 1509) BP: (100-155)/(55-93) 141/78 (03/10 1509) SpO2:  [95 %-100 %] 98 % (03/10 1509)  Weight change:  Filed Weights   01/26/24 1130  Weight: 101.6 kg    Intake/Output: I/O last 3 completed shifts: In: 1815.7 [P.O.:720;  I.V.:1095.7] Out: 4950 [Urine:4950]   Intake/Output this shift:  Total I/O In: 240 [P.O.:240] Out: 800 [Urine:800]  Physical Exam: General: NAD, resting comfortably  Head: Normocephalic, atraumatic.  Dry oral mucosal membranes  Eyes: Anicteric  Lungs:  Clear to auscultation, normal effort  Heart: Regular rate and rhythm  Abdomen:  Soft, nontender,   Extremities: No peripheral edema.  Neurologic: Alert and oriented to self and place, moving all four extremities  Skin: No lesions  Access: None    Basic Metabolic Panel: Recent Labs  Lab 01/25/24 1516 01/26/24 0448 01/27/24 0629 01/28/24 0458  NA 130* 131* 132* 135  K 4.8 4.8 4.6 4.5  CL 95* 98 101 101  CO2 22 23 23 24   GLUCOSE 144* 142* 132* 119*  BUN 73* 79* 83* 81*  CREATININE 4.76* 4.97* 4.87* 4.91*  CALCIUM 9.9 8.9 8.5* 8.5*    Liver Function Tests: Recent Labs  Lab 01/25/24 1516 01/27/24 0629  AST 30 23  ALT 18 18  ALKPHOS 54 49  BILITOT 1.8* 0.7  PROT 6.7 5.3*  ALBUMIN 3.8 2.5*   No results for input(s): "LIPASE", "AMYLASE" in the last 168 hours. No results for input(s): "AMMONIA" in the last 168 hours.  CBC: Recent Labs  Lab 01/25/24 1516 01/26/24 0448 01/27/24 0629  WBC 32.8* 31.2* 21.4*  NEUTROABS 26.5*  --   --   HGB 10.7* 9.6* 9.3*  HCT 31.5* 28.0* 27.3*  MCV 96.3 97.2 96.8  PLT 145* 129* 133*    Cardiac Enzymes: No results for input(s): "CKTOTAL", "CKMB", "  CKMBINDEX", "TROPONINI" in the last 168 hours.  BNP: Invalid input(s): "POCBNP"  CBG: No results for input(s): "GLUCAP" in the last 168 hours.  Microbiology: Results for orders placed or performed during the hospital encounter of 01/25/24  Resp panel by RT-PCR (RSV, Flu A&B, Covid) Anterior Nasal Swab     Status: None   Collection Time: 01/25/24  3:16 PM   Specimen: Anterior Nasal Swab  Result Value Ref Range Status   SARS Coronavirus 2 by RT PCR NEGATIVE NEGATIVE Final    Comment: (NOTE) SARS-CoV-2 target nucleic acids  are NOT DETECTED.  The SARS-CoV-2 RNA is generally detectable in upper respiratory specimens during the acute phase of infection. The lowest concentration of SARS-CoV-2 viral copies this assay can detect is 138 copies/mL. A negative result does not preclude SARS-Cov-2 infection and should not be used as the sole basis for treatment or other patient management decisions. A negative result may occur with  improper specimen collection/handling, submission of specimen other than nasopharyngeal swab, presence of viral mutation(s) within the areas targeted by this assay, and inadequate number of viral copies(<138 copies/mL). A negative result must be combined with clinical observations, patient history, and epidemiological information. The expected result is Negative.  Fact Sheet for Patients:  BloggerCourse.com  Fact Sheet for Healthcare Providers:  SeriousBroker.it  This test is no t yet approved or cleared by the [place] FDA and  has been authorized for detection and/or diagnosis of SARS-CoV-2 by FDA under an Emergency Use Authorization (EUA). This EUA will remain  in effect (meaning this test can be used) for the duration of the COVID-19 declaration under Section 564(b)(1) of the Act, 21 U.S.C.section 360bbb-3(b)(1), unless the authorization is terminated  or revoked sooner.       Influenza A by PCR NEGATIVE NEGATIVE Final   Influenza B by PCR NEGATIVE NEGATIVE Final    Comment: (NOTE) The Xpert Xpress SARS-CoV-2/FLU/RSV plus assay is intended as an aid in the diagnosis of influenza from Nasopharyngeal swab specimens and should not be used as a sole basis for treatment. Nasal washings and aspirates are unacceptable for Xpert Xpress SARS-CoV-2/FLU/RSV testing.  Fact Sheet for Patients: BloggerCourse.com  Fact Sheet for Healthcare Providers: SeriousBroker.it  This test is  not yet approved or cleared by the [place] FDA and has been authorized for detection and/or diagnosis of SARS-CoV-2 by FDA under an Emergency Use Authorization (EUA). This EUA will remain in effect (meaning this test can be used) for the duration of the COVID-19 declaration under Section 564(b)(1) of the Act, 21 U.S.C. section 360bbb-3(b)(1), unless the authorization is terminated or revoked.     Resp Syncytial Virus by PCR NEGATIVE NEGATIVE Final    Comment: (NOTE) Fact Sheet for Patients: BloggerCourse.com  Fact Sheet for Healthcare Providers: SeriousBroker.it  This test is not yet approved or cleared by the [place] FDA and has been authorized for detection and/or diagnosis of SARS-CoV-2 by FDA under an Emergency Use Authorization (EUA). This EUA will remain in effect (meaning this test can be used) for the duration of the COVID-19 declaration under Section 564(b)(1) of the Act, 21 U.S.C. section 360bbb-3(b)(1), unless the authorization is terminated or revoked.  Performed at [facility], [address] [place], [place] [address]   Urine Culture     Status: Abnormal   Collection Time: 01/25/24  5:42 PM   Specimen: Urine, Random  Result Value Ref Range Status   Specimen Description   Final    URINE, RANDOM Performed at  [facility], [address] [place], [place] [address]    Special Requests   Final    NONE Reflexed from [id number] Performed at [facility], [address] [place], [place] [address]    Culture >=100,000 COLONIES/mL ESCHERICHIA COLI (A)  Final   Report Status 01/28/2024 FINAL  Final   Organism ID, Bacteria ESCHERICHIA COLI (A)  Final      Susceptibility   Escherichia coli - MIC*    AMPICILLIN 4 SENSITIVE Sensitive     CEFAZOLIN <=4 SENSITIVE Sensitive     CEFEPIME <=0.12 SENSITIVE Sensitive     CEFTRIAXONE <=0.25 SENSITIVE Sensitive     CIPROFLOXACIN <=0.25  SENSITIVE Sensitive     GENTAMICIN <=1 SENSITIVE Sensitive     IMIPENEM <=0.25 SENSITIVE Sensitive     NITROFURANTOIN <=16 SENSITIVE Sensitive     TRIMETH/SULFA <=20 SENSITIVE Sensitive     AMPICILLIN/SULBACTAM <=2 SENSITIVE Sensitive     PIP/TAZO <=4 SENSITIVE Sensitive ug/mL    * >=100,000 COLONIES/mL ESCHERICHIA COLI    Coagulation Studies: No results for input(s): "LABPROT", "INR" in the last 72 hours.  Urinalysis: Recent Labs    01/25/24 1742  COLORURINE YELLOW*  LABSPEC 1.008  PHURINE 5.0  GLUCOSEU NEGATIVE  HGBUR MODERATE*  BILIRUBINUR NEGATIVE  KETONESUR NEGATIVE  PROTEINUR NEGATIVE  NITRITE NEGATIVE  LEUKOCYTESUR LARGE*      Imaging: No results found.   Medications:    sodium chloride 75 mL/hr at 01/27/24 2030    allopurinol  100 mg Oral Daily   apixaban  5 mg Oral BID   atorvastatin  40 mg Oral Daily   [START ON 01/29/2024] cephALEXin  500 mg Oral Q12H   Chlorhexidine Gluconate Cloth  6 each Topical Daily   ferrous sulfate  325 mg Oral BID WC   loratadine  10 mg Oral Daily   metoprolol succinate  25 mg Oral Daily   multivitamin with minerals  1 tablet Oral Daily   oxybutynin  10 mg Oral Daily   pantoprazole  40 mg Oral BID   tamsulosin  0.4 mg Oral Daily   acetaminophen **OR** acetaminophen, albuterol, calcium carbonate, labetalol, magnesium hydroxide, ondansetron **OR** ondansetron (ZOFRAN) IV, mouth rinse, traZODone  Assessment/ Plan:  Sean [name]. is a 83 y.o.  male with past medical history of CHF, diabetes, CAD s/p CABG, hypertension and chornic kidney disease stage IV.  Patient presents to the [facility] department with altered mental status and weakness and has been admitted for Acute cystitis with hematuria [N30.01] AKI (acute kidney injury) (HCC) [N17.9] Altered mental status, unspecified altered mental status type [R41.82] Acute kidney injury superimposed on chronic kidney disease (HCC) [N17.9, N18.9]   Acute Kidney Injury on chronic  kidney disease stage 4 with baseline creatinine 2.8 and GFR of 22 on 01/10/2024.  Acute kidney injury secondary to poor oral intake with concurrent infection.  UA positive for UTI with culture indicating E. coli.  No IV contrast exposure. Renal ultrasound shows simple cyst without obstruction.  Continue supportive measures.  Will order protein supplementation.  Agree with IV fluids.  Avoid nephrotoxic agents and therapies.  No acute indication for dialysis at this time.  Lab Results  Component Value Date   CREATININE 4.91 (H) 01/28/2024   CREATININE 4.87 (H) 01/27/2024   CREATININE 4.97 (H) 01/26/2024    Intake/Output Summary (Last 24 hours) at 01/28/2024 1530 Last data filed at 01/28/2024 1100 Gross per 24 hour  Intake 1815.69 ml  Output 3300 ml  Net -  1484.31 ml   2.  Hyponatremia likely secondary to kidney injury.  Has self corrected with IV fluids.  3. Anemia of chronic kidney disease Lab Results  Component Value Date   HGB 9.3 (L) 01/27/2024    Hemoglobin within desired range.  Will continue to monitor for need of ESA.  4.  Hypertension with chronic kidney disease.  Home regimen includes losartan, metoprolol, and spironolactone.  Patient also prescribed tamsulosin.  Only receiving metoprolol at this time.   LOS: 3 Evaristo Tsuda 3/10/20253:30 PM

## 2024-01-28 NOTE — Evaluation (Signed)
 Physical Therapy Evaluation Patient Details Name: Sean [name]. MRN: [id number] DOB: 1940/12/30 Today's Date: 01/28/2024  History of Present Illness  Pt is an 83 yo male that presented to ED for generalized weakness and AMS, workup for UTI. PMH of OA, asthma, CAD, CHF, GERD, CKD, HTN, PE.  Clinical Impression  Pt alert, oriented, family at bedside. Pt in bathroom upon PT arrival, able to stand with supervision and use of grab bar and RW, and ambulated back to EOB to discuss PLOF with PT. Pt answered some questions and also deferred some to his wife. At baseline he is independent for ADLs, no AD, family/wife assists with IADLs, no true falls in the last 6 months. He was able to ambulate ~215ft in total with RW and supervision, no LOB noted, decreased gait velocity evident. Pt/family discussed a desire to maximize function and strengthening and this author agree's follow up therapy services to assist pt to return to [facility] exercise program would be beneficial.         If plan is discharge home, recommend the following: Assistance with cooking/housework;Assist for transportation;Help with stairs or ramp for entrance   Can travel by private vehicle        Equipment Recommendations Rolling walker (2 wheels)  Recommendations for Other Services       Functional Status Assessment Patient has had a recent decline in their functional status and demonstrates the ability to make significant improvements in function in a reasonable and predictable amount of time.     Precautions / Restrictions Precautions Precautions: Fall Restrictions Weight Bearing Restrictions Per Provider Order: No      Mobility  Bed Mobility               General bed mobility comments: pt up in bathroom upon PT entrance, and then seated in recliner at end of session    Transfers Overall transfer level: Needs assistance Equipment used: Rolling walker (2 wheels) Transfers: Sit to/from Stand Sit to Stand:  Supervision           General transfer comment: cued for safe hand placement    Ambulation/Gait Ambulation/Gait assistance: Supervision Gait Distance (Feet): 250 Feet Assistive device: Rolling walker (2 wheels)            Stairs            Wheelchair Mobility     Tilt Bed    Modified Rankin (Stroke Patients Only)       Balance Overall balance assessment: Needs assistance Sitting-balance support: Feet supported Sitting balance-Leahy Scale: Good       Standing balance-Leahy Scale: Fair Standing balance comment: pt appears more comfortable with BUE support                             Pertinent Vitals/Pain Pain Assessment Pain Assessment: No/denies pain    Home Living Family/patient expects to be discharged to:: Private residence Living Arrangements: Spouse/significant other Available Help at Discharge: Family Type of Home: House Home Access: Stairs to enter Entrance Stairs-Rails: Right Entrance Stairs-Number of Steps: 4   Home Layout: One level Home Equipment: Shower seat - built in;Grab bars - toilet      Prior Function Prior Level of Function : Independent/Modified Independent             Mobility Comments: doesn't use AD at home, but performs ADLs ADLs Comments: family does ADLs     Extremity/Trunk Assessment   Upper Extremity  Assessment Upper Extremity Assessment: Generalized weakness    Lower Extremity Assessment Lower Extremity Assessment: Generalized weakness       Communication        Cognition Arousal: Alert Behavior During Therapy: WFL for tasks assessed/performed   PT - Cognitive impairments: No apparent impairments                                 Cueing       General Comments      Exercises     Assessment/Plan    PT Assessment Patient needs continued PT services  PT Problem List Decreased strength;Decreased mobility;Decreased balance;Decreased activity tolerance       PT  Treatment Interventions DME instruction;Balance training;Gait training;Neuromuscular re-education;Stair training;Functional mobility training;Patient/family education;Therapeutic activities;Therapeutic exercise    PT Goals (Current goals can be found in the Care Plan section)  Acute Rehab PT Goals Patient Stated Goal: to go home PT Goal Formulation: With patient Time For Goal Achievement: 02/11/24 Potential to Achieve Goals: Good    Frequency Min 1X/week     Co-evaluation               AM-PAC PT "6 Clicks" Mobility  Outcome Measure Help needed turning from your back to your side while in a flat bed without using bedrails?: None Help needed moving from lying on your back to sitting on the side of a flat bed without using bedrails?: None Help needed moving to and from a bed to a chair (including a wheelchair)?: None Help needed standing up from a chair using your arms (e.g., wheelchair or bedside chair)?: None Help needed to walk in hospital room?: A Little Help needed climbing 3-5 steps with a railing? : A Little 6 Click Score: 22    End of Session Equipment Utilized During Treatment: Gait belt Activity Tolerance: Patient tolerated treatment well Patient left: in chair;with call bell/phone within reach;with chair alarm set Nurse Communication: Mobility status PT Visit Diagnosis: Other abnormalities of gait and mobility (R26.89);Difficulty in walking, not elsewhere classified (R26.2);Muscle weakness (generalized) (M62.81)    Time: 9604-5409 PT Time Calculation (min) (ACUTE ONLY): 24 min   Charges:   PT Evaluation $PT Eval Low Complexity: 1 Low PT Treatments $Therapeutic Activity: 8-22 mins PT General Charges $$ ACUTE PT VISIT: 1 Visit        Olga Coaster PT, DPT 11:26 AM,01/28/24

## 2024-01-28 NOTE — Progress Notes (Signed)
 Progress Note   Patient: Sean [name]. MVH:[id number] DOB: 1941/01/05 DOA: 01/25/2024     3 DOS: the patient was seen and examined on 01/28/2024   Brief hospital course: Xavious Sharrar. is a 83 y.o. Caucasian male with medical history significant for osteoarthritis, asthma, coronary artery disease, chronic systolic CHF, GERD, stage III CKD, essential hypertension and obesity, who presented to the emergency room with acute onset of generalized weakness altered mental status. He had temperature 100.3, severe leukocytosis 32.8, lactic acid 2.2, she also has worsening renal function. Patient also has abnormal urine, diagnosed with urinary tract infection.  Placed on IV fluids and antibiotics.   Principal Problem:   Acute renal failure superimposed on stage 4 chronic kidney disease (HCC) Active Problems:   Acute lower UTI   Acute encephalopathy   Essential hypertension   Leukocytosis   Dyslipidemia   BPH (benign prostatic hyperplasia)   Gout   Coronary artery disease   Atrial flutter (HCC)   History of pulmonary embolus (PE)   Paroxysmal atrial flutter (HCC)   Severe sepsis (HCC)   Class 1 obesity   Assessment and Plan: * Acute renal failure superimposed on stage 4 chronic kidney disease (HCC) Hyponatremia. Chart, patient has chronic kidney stage IV.  Renal function much worse due to severe sepsis.  Bladder scan did not show any urinary tension, he has a chronic Foley catheter since July last year. Initial CT scan showed distended bladder, however, bladder scan performed yesterday showed a residual of 0.  He has been treated with normal saline, renal function has not been getting better.  Will obtain nephrology consult.   Severe sepsis secondary to urinary tract infection. Acute metabolic encephalopathy secondary to urinary tract infection. Urinary tract infection secondary to indwelling Foley catheter. Benign prostatic hypertrophy with the Foley catheter. Patient has a chronic  Foley catheter since July last year.  Followed by urology as outpatient.  Bladder scan showed 0 residual. Patient has significant altered mental status time of admission. Patient also had fever, severe leukocytosis, lactic acidosis and worsening renal function.  Condition consistent with severe sepsis. It appears that blood culture was sent out at admission, urine culture grew E. coli. Continue Rocephin and 2 g daily for now.     Essential hypertension Continue home medicines, blood pressure not significant elevated.   Dyslipidemia - We will continue statin therapy.   BPH (benign prostatic hyperplasia) - We will continue Flomax.   Gout - We will continue allopurinol.   Paroxysmal atrial flutter (HCC) - We will continue Toprol-XL and Eliquis.   History of pulmonary embolus (PE) - We will continue Eliquis.   Coronary artery disease. Chronic combined systolic diastolic congestive heart failure. Conditions are stable.        Subjective:  Patient has been complaining of upper stomach pain, nausea.  No vomiting.  Had a bowel movement yesterday.  Physical Exam: Vitals:   01/27/24 1832 01/27/24 2010 01/28/24 0348 01/28/24 0816  BP: (!) 155/93 135/77 (!) 128/55 100/75  Pulse: 73 69 69 71  Resp: 16 18 18 18   Temp: 97.8 F (36.6 C) 98.4 F (36.9 C) 99.3 F (37.4 C) 98.4 F (36.9 C)  TempSrc: Oral Oral Oral Oral  SpO2: 98% 96% 100% 95%  Weight:      Height:       General exam: Appears calm and comfortable  Respiratory system: Clear to auscultation. Respiratory effort normal. Cardiovascular system: S1 & S2 heard, RRR. No JVD, murmurs, rubs, gallops  or clicks. No pedal edema. Gastrointestinal system: Abdomen is nondistended, soft and nontender. No organomegaly or masses felt. Normal bowel sounds heard. Central nervous system: Alert and oriented x2. No focal neurological deficits. Extremities: Symmetric 5 x 5 power. Skin: No rashes, lesions or ulcers Psychiatry:  Mood &  affect appropriate.    Data Reviewed:  Lab results reviewed.  Family Communication: Daughter and wife updated in the room.  Disposition: Status is: Inpatient Remains inpatient appropriate because: Severity of disease, IV treatment.     Time spent: 35 minutes  Author: Marrion Coy, MD 01/28/2024 12:26 PM  For on call review [email or web address].

## 2024-01-29 DIAGNOSIS — N184 Chronic kidney disease, stage 4 (severe): Secondary | ICD-10-CM | POA: Diagnosis not present

## 2024-01-29 DIAGNOSIS — N178 Other acute kidney failure: Secondary | ICD-10-CM | POA: Diagnosis not present

## 2024-01-29 DIAGNOSIS — G9340 Encephalopathy, unspecified: Secondary | ICD-10-CM | POA: Diagnosis not present

## 2024-01-29 DIAGNOSIS — N390 Urinary tract infection, site not specified: Secondary | ICD-10-CM | POA: Diagnosis not present

## 2024-01-29 MED ORDER — NEPRO/CARBSTEADY PO LIQD
237.0000 mL | Freq: Two times a day (BID) | ORAL | Status: DC
  Administered 2024-01-29 – 2024-02-01 (×6): 237 mL via ORAL

## 2024-01-29 NOTE — Progress Notes (Addendum)
 Progress Note   Patient: Sean [name]. VWU:[id number] DOB: [birth year] DOA: 01/25/2024     4 DOS: the patient was seen and examined on 01/29/2024   Brief hospital course: Sean [name]. is a 83 y.o. Caucasian male with medical history significant for osteoarthritis, asthma, coronary artery disease, chronic systolic CHF, GERD, stage III CKD, essential hypertension and obesity, who presented to the emergency room with acute onset of generalized weakness altered mental status. He had temperature 100.3, severe leukocytosis 32.8, lactic acid 2.2, she also has worsening renal function. Patient also has abnormal urine, diagnosed with urinary tract infection.  Placed on IV fluids and antibiotics.   Principal Problem:   Acute renal failure superimposed on stage 4 chronic kidney disease (HCC) Active Problems:   Acute lower UTI   Acute encephalopathy   Essential hypertension   Leukocytosis   Dyslipidemia   BPH (benign prostatic hyperplasia)   Gout   Coronary artery disease   Atrial flutter (HCC)   History of pulmonary embolus (PE)   Paroxysmal atrial flutter (HCC)   Severe sepsis (HCC)   Class 1 obesity   Assessment and Plan:   Acute renal failure superimposed on stage 4 chronic kidney disease (HCC) Hyponatremia. Chart, patient has chronic kidney stage IV.  Renal function much worse due to severe sepsis.  Bladder scan did not show any urinary tension, he has a chronic Foley catheter since July last year. Initial CT scan showed distended bladder, however, bladder scan performed 3/9 showed a residual of 0.  He has been treated with normal saline, renal function has not been getting better.  Repeat bladder scan residual today was still 0. Has been seen by nephrology, renal function still deteriorating.  Per the patient making large amount of urine, hopefully creatinine will plateau and start to improve.    Severe sepsis secondary to urinary tract infection. Acute metabolic encephalopathy  secondary to urinary tract infection. Urinary tract infection secondary to indwelling Foley catheter. Benign prostatic hypertrophy with the Foley catheter. Patient has a chronic Foley catheter since July last year.  Followed by urology as outpatient.  Bladder scan showed 0 residual. Patient has significant altered mental status time of admission. Patient also had fever, severe leukocytosis, lactic acidosis and worsening renal function.  Condition consistent with severe sepsis. It appears that blood culture was sent out at admission, urine culture grew E. coli. Continue Rocephin and 2 g daily for now. Patient has improved.   Essential hypertension Continue home medicines, blood pressure not significant elevated.   Dyslipidemia - We will continue statin therapy.   BPH (benign prostatic hyperplasia) - We will continue Flomax.   Gout - We will continue allopurinol.   Paroxysmal atrial flutter (HCC) - We will continue Toprol-XL, Eliquis   History of pulmonary embolus (PE) - We will continue Eliquis.   Coronary artery disease. Chronic combined systolic diastolic congestive heart failure. Conditions are stable. Patient still receiving IV fluids, will monitor closely.           Subjective:  Patient does not feel short of breath, he is making large amount of urine.  Appetite has been poor.  Physical Exam: Vitals:   01/29/24 0336 01/29/24 0800 01/29/24 0800 01/29/24 0803  BP: 129/72 (!) 159/79 (!) 162/83 (!) 159/79  Pulse: 70 69 69 69  Resp: 20 20 20 20   Temp: 98.2 F (36.8 C) 98.2 F (36.8 C) 98.2 F (36.8 C) 98.2 F (36.8 C)  TempSrc:  Oral    SpO2:  90% 97% 98% 98%  Weight:      Height:       General exam: Appears calm and comfortable  Respiratory system: Decreased breath sounds. Respiratory effort normal. Cardiovascular system: S1 & S2 heard, RRR. No JVD, murmurs, rubs, gallops or clicks. No pedal edema. Gastrointestinal system: Abdomen is nondistended, soft and  nontender. No organomegaly or masses felt. Normal bowel sounds heard. Central nervous system: Alert and oriented x2. No focal neurological deficits. Extremities: Symmetric 5 x 5 power. Skin: No rashes, lesions or ulcers Psychiatry: Mood & affect appropriate.    Data Reviewed:  Lab results reviewed.  Family Communication: Wife updated at bedside.  Disposition: Status is: Inpatient Remains inpatient appropriate because: Severity of disease, IV treatment.     Time spent: 35 minutes  Author: Marrion Coy, MD 01/29/2024 12:13 PM  For on call review [email or web address].

## 2024-01-29 NOTE — Plan of Care (Signed)

## 2024-01-29 NOTE — Progress Notes (Signed)
 [facility]  ROUNDING NOTE   Subjective:   Sean [name]. Is a 83 y.o. male with past medical history of CHF, diabetes, CAD s/p CABG, hypertension and chornic kidney disease stage IV.  Patient presents to the [facility] department with altered mental status and weakness and has been admitted for Acute cystitis with hematuria [N30.01] AKI (acute kidney injury) (HCC) [N17.9] Altered mental status, unspecified altered mental status type [R41.82] Acute kidney injury superimposed on chronic kidney disease (HCC) [N17.9, N18.9]  Patient is known to our practice from previous admission and is followed in our office by Dr. Suezanne Jacquet.    Patient laying in bed Alert Wife at bedside Wife states his appetite remains poor Confusion   Creatinine 4.91 Urine output 2.9L   Objective:  Vital signs in last 24 hours:  Temp:  [98.1 F (36.7 C)-98.4 F (36.9 C)] 98.2 F (36.8 C) (03/11 0803) Pulse Rate:  [69-71] 69 (03/11 0803) Resp:  [18-20] 20 (03/11 0803) BP: (129-162)/(71-83) 159/79 (03/11 0803) SpO2:  [90 %-98 %] 98 % (03/11 0803)  Weight change:  Filed Weights   01/26/24 1130  Weight: 101.6 kg    Intake/Output: I/O last 3 completed shifts: In: 2055.7 [P.O.:960; I.V.:1095.7] Out: 4650 [Urine:4650]   Intake/Output this shift:  Total I/O In: 120 [P.O.:120] Out: 1500 [Urine:1500]  Physical Exam: General: NAD, resting comfortably  Head: Normocephalic, atraumatic.  Dry oral mucosal membranes  Eyes: Anicteric  Lungs:  Clear to auscultation, normal effort  Heart: Regular rate and rhythm  Abdomen:  Soft, nontender  Extremities: No peripheral edema.  Neurologic: Alert and awake, moving all four extremities  Skin: No lesions  Access: None  GU Foley catheter  Basic Metabolic Panel: Recent Labs  Lab 01/25/24 1516 01/26/24 0448 01/27/24 0629 01/28/24 0458  NA 130* 131* 132* 135  K 4.8 4.8 4.6 4.5  CL 95* 98 101 101  CO2 22 23 23 24   GLUCOSE 144* 142* 132* 119*  BUN  73* 79* 83* 81*  CREATININE 4.76* 4.97* 4.87* 4.91*  CALCIUM 9.9 8.9 8.5* 8.5*    Liver Function Tests: Recent Labs  Lab 01/25/24 1516 01/27/24 0629  AST 30 23  ALT 18 18  ALKPHOS 54 49  BILITOT 1.8* 0.7  PROT 6.7 5.3*  ALBUMIN 3.8 2.5*   No results for input(s): "LIPASE", "AMYLASE" in the last 168 hours. No results for input(s): "AMMONIA" in the last 168 hours.  CBC: Recent Labs  Lab 01/25/24 1516 01/26/24 0448 01/27/24 0629  WBC 32.8* 31.2* 21.4*  NEUTROABS 26.5*  --   --   HGB 10.7* 9.6* 9.3*  HCT 31.5* 28.0* 27.3*  MCV 96.3 97.2 96.8  PLT 145* 129* 133*    Cardiac Enzymes: No results for input(s): "CKTOTAL", "CKMB", "CKMBINDEX", "TROPONINI" in the last 168 hours.  BNP: Invalid input(s): "POCBNP"  CBG: No results for input(s): "GLUCAP" in the last 168 hours.  Microbiology: Results for orders placed or performed during the hospital encounter of 01/25/24  Resp panel by RT-PCR (RSV, Flu A&B, Covid) Anterior Nasal Swab     Status: None   Collection Time: 01/25/24  3:16 PM   Specimen: Anterior Nasal Swab  Result Value Ref Range Status   SARS Coronavirus 2 by RT PCR NEGATIVE NEGATIVE Final    Comment: (NOTE) SARS-CoV-2 target nucleic acids are NOT DETECTED.  The SARS-CoV-2 RNA is generally detectable in upper respiratory specimens during the acute phase of infection. The lowest concentration of SARS-CoV-2 viral copies this assay can detect  is 138 copies/mL. A negative result does not preclude SARS-Cov-2 infection and should not be used as the sole basis for treatment or other patient management decisions. A negative result may occur with  improper specimen collection/handling, submission of specimen other than nasopharyngeal swab, presence of viral mutation(s) within the areas targeted by this assay, and inadequate number of viral copies(<138 copies/mL). A negative result must be combined with clinical observations, patient history, and  epidemiological information. The expected result is Negative.  Fact Sheet for Patients:  BloggerCourse.com  Fact Sheet for Healthcare Providers:  SeriousBroker.it  This test is no t yet approved or cleared by the [place] FDA and  has been authorized for detection and/or diagnosis of SARS-CoV-2 by FDA under an Emergency Use Authorization (EUA). This EUA will remain  in effect (meaning this test can be used) for the duration of the COVID-19 declaration under Section 564(b)(1) of the Act, 21 U.S.C.section 360bbb-3(b)(1), unless the authorization is terminated  or revoked sooner.       Influenza A by PCR NEGATIVE NEGATIVE Final   Influenza B by PCR NEGATIVE NEGATIVE Final    Comment: (NOTE) The Xpert Xpress SARS-CoV-2/FLU/RSV plus assay is intended as an aid in the diagnosis of influenza from Nasopharyngeal swab specimens and should not be used as a sole basis for treatment. Nasal washings and aspirates are unacceptable for Xpert Xpress SARS-CoV-2/FLU/RSV testing.  Fact Sheet for Patients: BloggerCourse.com  Fact Sheet for Healthcare Providers: SeriousBroker.it  This test is not yet approved or cleared by the [place] FDA and has been authorized for detection and/or diagnosis of SARS-CoV-2 by FDA under an Emergency Use Authorization (EUA). This EUA will remain in effect (meaning this test can be used) for the duration of the COVID-19 declaration under Section 564(b)(1) of the Act, 21 U.S.C. section 360bbb-3(b)(1), unless the authorization is terminated or revoked.     Resp Syncytial Virus by PCR NEGATIVE NEGATIVE Final    Comment: (NOTE) Fact Sheet for Patients: BloggerCourse.com  Fact Sheet for Healthcare Providers: SeriousBroker.it  This test is not yet approved or cleared by the [place] FDA and has been  authorized for detection and/or diagnosis of SARS-CoV-2 by FDA under an Emergency Use Authorization (EUA). This EUA will remain in effect (meaning this test can be used) for the duration of the COVID-19 declaration under Section 564(b)(1) of the Act, 21 U.S.C. section 360bbb-3(b)(1), unless the authorization is terminated or revoked.  Performed at [facility], [address], [place], [place] [address]   Urine Culture     Status: Abnormal   Collection Time: 01/25/24  5:42 PM   Specimen: Urine, Random  Result Value Ref Range Status   Specimen Description   Final    URINE, RANDOM Performed at [facility], [address] [place], [place] [address]    Special Requests   Final    NONE Reflexed from [id number] Performed at [facility], [address] [place], [place] [address]    Culture >=100,000 COLONIES/mL ESCHERICHIA COLI (A)  Final   Report Status 01/28/2024 FINAL  Final   Organism ID, Bacteria ESCHERICHIA COLI (A)  Final      Susceptibility   Escherichia coli - MIC*    AMPICILLIN 4 SENSITIVE Sensitive     CEFAZOLIN <=4 SENSITIVE Sensitive     CEFEPIME <=0.12 SENSITIVE Sensitive     CEFTRIAXONE <=0.25 SENSITIVE Sensitive     CIPROFLOXACIN <=0.25 SENSITIVE Sensitive     GENTAMICIN <=1 SENSITIVE Sensitive  IMIPENEM <=0.25 SENSITIVE Sensitive     NITROFURANTOIN <=16 SENSITIVE Sensitive     TRIMETH/SULFA <=20 SENSITIVE Sensitive     AMPICILLIN/SULBACTAM <=2 SENSITIVE Sensitive     PIP/TAZO <=4 SENSITIVE Sensitive ug/mL    * >=100,000 COLONIES/mL ESCHERICHIA COLI    Coagulation Studies: No results for input(s): "LABPROT", "INR" in the last 72 hours.  Urinalysis: No results for input(s): "COLORURINE", "LABSPEC", "PHURINE", "GLUCOSEU", "HGBUR", "BILIRUBINUR", "KETONESUR", "PROTEINUR", "UROBILINOGEN", "NITRITE", "LEUKOCYTESUR" in the last 72 hours.  Invalid input(s): "APPERANCEUR"     Imaging: No results found.   Medications:    sodium  chloride 75 mL/hr at 01/29/24 0123    allopurinol  100 mg Oral Daily   apixaban  5 mg Oral BID   atorvastatin  40 mg Oral Daily   cephALEXin  500 mg Oral Q12H   Chlorhexidine Gluconate Cloth  6 each Topical Daily   feeding supplement (NEPRO CARB STEADY)  237 mL Oral BID BM   ferrous sulfate  325 mg Oral BID WC   loratadine  10 mg Oral Daily   metoprolol succinate  25 mg Oral Daily   multivitamin with minerals  1 tablet Oral Daily   oxybutynin  10 mg Oral Daily   pantoprazole  40 mg Oral BID   tamsulosin  0.4 mg Oral Daily   acetaminophen **OR** acetaminophen, albuterol, calcium carbonate, labetalol, ondansetron **OR** ondansetron (ZOFRAN) IV, mouth rinse, traZODone  Assessment/ Plan:  Mr. Sean [name]. is a 83 y.o.  male with past medical history of CHF, diabetes, CAD s/p CABG, hypertension and chornic kidney disease stage IV.  Patient presents to the [facility] department with altered mental status and weakness and has been admitted for Acute cystitis with hematuria [N30.01] AKI (acute kidney injury) (HCC) [N17.9] Altered mental status, unspecified altered mental status type [R41.82] Acute kidney injury superimposed on chronic kidney disease (HCC) [N17.9, N18.9]   Acute Kidney Injury on chronic kidney disease stage 4 with baseline creatinine 2.8 and GFR of 22 on 01/10/2024.  Acute kidney injury secondary to poor oral intake with concurrent infection.  UA positive for UTI with culture indicating E. coli.  No IV contrast exposure. Renal ultrasound shows simple cyst without obstruction.    Renal function has remained the same today. Good urine output noted in foley. Agree with IVF for now. No acute need for dialysis. Will continue to monitor.  Lab Results  Component Value Date   CREATININE 4.91 (H) 01/28/2024   CREATININE 4.87 (H) 01/27/2024   CREATININE 4.97 (H) 01/26/2024    Intake/Output Summary (Last 24 hours) at 01/29/2024 1410 Last data filed at 01/29/2024 1403 Gross per 24 hour   Intake 360 ml  Output 3600 ml  Net -3240 ml   2.  Hyponatremia likely secondary to kidney injury.    Sodium 135   3. Anemia of chronic kidney disease Lab Results  Component Value Date   HGB 9.3 (L) 01/27/2024    Hemoglobin at goal.  Will continue to monitor   4.  Hypertension with chronic kidney disease.  Home regimen includes losartan, metoprolol, and spironolactone.  Patient also prescribed tamsulosin.  Only receiving metoprolol. Blood pressure continues to fluctuate.    LOS: 4 Richa Shor 3/11/20252:10 PM

## 2024-01-29 NOTE — Progress Notes (Signed)
 Mobility Specialist - Progress Note  Post-mobility: HR 77, SPO2 96%   01/29/24 1533  Mobility  Activity Ambulated with assistance in hallway  Level of Assistance Standby assist, set-up cues, supervision of patient - no hands on  Assistive Device Front wheel walker  Distance Ambulated (ft) 120 ft  Activity Response Tolerated well  Mobility visit 1 Mobility  Mobility Specialist Start Time (ACUTE ONLY) 1458  Mobility Specialist Stop Time (ACUTE ONLY) 1516  Mobility Specialist Time Calculation (min) (ACUTE ONLY) 18 min   Pt seated EOB upon entry, utilizing RA. Pt agreeable to OOB amb within the hallway this date. Pt STS to RW MinG and amb ~120 ft in the hallway with supervision, min cuing for body position within the RW. Pt expressed feeling tried and weak 60 ft into amb, self regulates PBL during return to room. Pt left seated EOB with needs within reach, family members present at bedside.  Zetta Bills Mobility Specialist 01/29/24 3:39 PM

## 2024-01-29 NOTE — Plan of Care (Signed)
  Problem: Education: Goal: Knowledge of General Education information will improve Description: Including pain rating scale, medication(s)/side effects and non-pharmacologic comfort measures 01/29/2024 0522 by Eldridge Abrahams, RN Outcome: Progressing 01/29/2024 0522 by Eldridge Abrahams, RN Outcome: Progressing   Problem: Health Behavior/Discharge Planning: Goal: Ability to manage health-related needs will improve 01/29/2024 0522 by Eldridge Abrahams, RN Outcome: Progressing 01/29/2024 0522 by Eldridge Abrahams, RN Outcome: Progressing   Problem: Clinical Measurements: Goal: Ability to maintain clinical measurements within normal limits will improve 01/29/2024 0522 by Eldridge Abrahams, RN Outcome: Progressing 01/29/2024 0522 by Eldridge Abrahams, RN Outcome: Progressing Goal: Will remain free from infection 01/29/2024 0522 by Eldridge Abrahams, RN Outcome: Progressing 01/29/2024 0522 by Eldridge Abrahams, RN Outcome: Progressing Goal: Diagnostic test results will improve 01/29/2024 0522 by Eldridge Abrahams, RN Outcome: Progressing 01/29/2024 0522 by Eldridge Abrahams, RN Outcome: Progressing Goal: Respiratory complications will improve 01/29/2024 0522 by Eldridge Abrahams, RN Outcome: Progressing 01/29/2024 0522 by Eldridge Abrahams, RN Outcome: Progressing Goal: Cardiovascular complication will be avoided 01/29/2024 0522 by Eldridge Abrahams, RN Outcome: Progressing 01/29/2024 0522 by Eldridge Abrahams, RN Outcome: Progressing   Problem: Activity: Goal: Risk for activity intolerance will decrease 01/29/2024 0522 by Eldridge Abrahams, RN Outcome: Progressing 01/29/2024 0522 by Eldridge Abrahams, RN Outcome: Progressing   Problem: Nutrition: Goal: Adequate nutrition will be maintained 01/29/2024 0522 by Eldridge Abrahams, RN Outcome: Progressing 01/29/2024 0522 by Eldridge Abrahams, RN Outcome: Progressing   Problem: Coping: Goal: Level of anxiety will decrease 01/29/2024  0522 by Eldridge Abrahams, RN Outcome: Progressing 01/29/2024 0522 by Eldridge Abrahams, RN Outcome: Progressing   Problem: Elimination: Goal: Will not experience complications related to bowel motility 01/29/2024 0522 by Eldridge Abrahams, RN Outcome: Progressing 01/29/2024 0522 by Eldridge Abrahams, RN Outcome: Progressing Goal: Will not experience complications related to urinary retention 01/29/2024 0522 by Eldridge Abrahams, RN Outcome: Progressing 01/29/2024 0522 by Eldridge Abrahams, RN Outcome: Progressing   Problem: Pain Managment: Goal: General experience of comfort will improve and/or be controlled 01/29/2024 0522 by Eldridge Abrahams, RN Outcome: Progressing 01/29/2024 0522 by Eldridge Abrahams, RN Outcome: Progressing   Problem: Safety: Goal: Ability to remain free from injury will improve 01/29/2024 0522 by Eldridge Abrahams, RN Outcome: Progressing 01/29/2024 0522 by Eldridge Abrahams, RN Outcome: Progressing   Problem: Skin Integrity: Goal: Risk for impaired skin integrity will decrease 01/29/2024 0522 by Eldridge Abrahams, RN Outcome: Progressing 01/29/2024 0522 by Eldridge Abrahams, RN Outcome: Progressing

## 2024-01-30 DIAGNOSIS — N184 Chronic kidney disease, stage 4 (severe): Secondary | ICD-10-CM | POA: Diagnosis not present

## 2024-01-30 DIAGNOSIS — N178 Other acute kidney failure: Secondary | ICD-10-CM | POA: Diagnosis not present

## 2024-01-30 LAB — BASIC METABOLIC PANEL
Anion gap: 10 (ref 5–15)
BUN: 68 mg/dL — ABNORMAL HIGH (ref 8–23)
CO2: 22 mmol/L (ref 22–32)
Calcium: 8 mg/dL — ABNORMAL LOW (ref 8.9–10.3)
Chloride: 105 mmol/L (ref 98–111)
Creatinine, Ser: 3.9 mg/dL — ABNORMAL HIGH (ref 0.61–1.24)
GFR, Estimated: 15 mL/min — ABNORMAL LOW (ref >60)
Glucose, Bld: 127 mg/dL — ABNORMAL HIGH (ref 70–99)
Potassium: 4.3 mmol/L (ref 3.5–5.1)
Sodium: 137 mmol/L (ref 135–145)

## 2024-01-30 MED ORDER — SODIUM CHLORIDE 0.9 % IV SOLN: INTRAVENOUS | Status: AC

## 2024-01-30 MED ORDER — CEPHALEXIN 500 MG PO CAPS
500.0000 mg | ORAL_CAPSULE | Freq: Three times a day (TID) | ORAL | Status: AC
  Administered 2024-01-30 – 2024-01-31 (×3): 500 mg via ORAL
  Filled 2024-01-30 (×3): qty 1

## 2024-01-30 NOTE — Progress Notes (Signed)
 Physical Therapy Treatment Patient Details Name: Sean [name]. MRN: [id number] DOB: [birth year] Today's Date: 01/30/2024   History of Present Illness Pt is an 83 yo male that presented to ED for generalized weakness and AMS, workup for UTI. PMH of OA, asthma, CAD, CHF, GERD, CKD, HTN, PE.    PT Comments  Pt agreeable to ambulation with some encouragement, but declined any further exercises or OOB to chair at end of session. Pt encouraged to transfer to recliner for lunch, pt agreed. He was able to perform bed mobility with supervision. He ambulated ~152ft total with RW and initially supervision, but pt appeared and reported feeling fatigued; needed a few standing rest breaks and PLB when returning to the room, CGA for safety. Recommend assessing orthostatic vitals prior to next mobility session. The patient would benefit from further skilled PT intervention to continue to progress towards goals.     If plan is discharge home, recommend the following: Assistance with cooking/housework;Assist for transportation;Help with stairs or ramp for entrance   Can travel by private vehicle        Equipment Recommendations  Rolling walker (2 wheels)    Recommendations for Other Services       Precautions / Restrictions Precautions Precautions: Fall Recall of Precautions/Restrictions: Intact Restrictions Weight Bearing Restrictions Per Provider Order: No     Mobility  Bed Mobility Overal bed mobility: Needs Assistance Bed Mobility: Supine to Sit, Sit to Supine     Supine to sit: Supervision, Used rails Sit to supine: Supervision, Used rails        Transfers Overall transfer level: Needs assistance Equipment used: Rolling walker (2 wheels) Transfers: Sit to/from Stand Sit to Stand: Supervision           General transfer comment: cued for safe hand placement    Ambulation/Gait Ambulation/Gait assistance: Supervision, Contact guard assist Gait Distance (Feet): 100  Feet Assistive device: Rolling walker (2 wheels)   Gait velocity: decreased     General Gait Details: CGA for last 7feet due to pt appearing to not feel well; stops to rest, reported feeling like "I know i've been walking!" PLB.   Stairs             Wheelchair Mobility     Tilt Bed    Modified Rankin (Stroke Patients Only)       Balance Overall balance assessment: Needs assistance Sitting-balance support: Feet supported Sitting balance-Leahy Scale: Good       Standing balance-Leahy Scale: Fair                              Communication    Cognition Arousal: Alert Behavior During Therapy: WFL for tasks assessed/performed   PT - Cognitive impairments: No apparent impairments                       PT - Cognition Comments: HOH        Cueing    Exercises      General Comments        Pertinent Vitals/Pain Pain Assessment Pain Assessment: No/denies pain    Home Living                          Prior Function            PT Goals (current goals can now be found in the care plan section) Progress towards PT  goals: Progressing toward goals    Frequency    Min 1X/week      PT Plan      Co-evaluation              AM-PAC PT "6 Clicks" Mobility   Outcome Measure  Help needed turning from your back to your side while in a flat bed without using bedrails?: None Help needed moving from lying on your back to sitting on the side of a flat bed without using bedrails?: None Help needed moving to and from a bed to a chair (including a wheelchair)?: None Help needed standing up from a chair using your arms (e.g., wheelchair or bedside chair)?: None Help needed to walk in hospital room?: A Little Help needed climbing 3-5 steps with a railing? : A Little 6 Click Score: 22    End of Session Equipment Utilized During Treatment: Gait belt Activity Tolerance: Patient tolerated treatment well Patient left: in  bed;with call bell/phone within reach;with bed alarm set (pt declined OOB to recliner after walking) Nurse Communication: Mobility status PT Visit Diagnosis: Other abnormalities of gait and mobility (R26.89);Difficulty in walking, not elsewhere classified (R26.2);Muscle weakness (generalized) (M62.81)     Time: 1610-9604 PT Time Calculation (min) (ACUTE ONLY): 8 min  Charges:    $Therapeutic Activity: 8-22 mins PT General Charges $$ ACUTE PT VISIT: 1 Visit                     Olga Coaster PT, DPT 11:17 AM,01/30/24

## 2024-01-30 NOTE — Plan of Care (Signed)

## 2024-01-30 NOTE — TOC Initial Note (Signed)
 Transition of Care (TOC) - Initial/Assessment Note    Patient Details  Name: Sean [name]. MRN: [id number] Date of Birth: Nov 04, 1941  Transition of Care [facility]) CM/SW Contact:    Sean Fitch, RN Phone Number: 01/30/2024, 2:52 PM  Clinical Narrative:                  Admitted for: AKI Admitted from: From home with wife PCP: Sean [name] Current home health/prior home health/DME: [facility]  Therapy recommending home health. Patient defers to wife Sean [name] due to being hard of hearing.  Wife in agreement and states that she does not have a preference of agency.  Referral made to [facility] with [facility]  RW will need to be ordered closer   Daughter or grandson to transport at discharge        Patient Goals and CMS Choice            Expected Discharge Plan and Services                                              Prior Living Arrangements/Services                       Activities of Daily Living   ADL Screening (condition at time of admission) Independently performs ADLs?: Yes (appropriate for developmental age) Is the patient deaf or have difficulty hearing?: No Does the patient have difficulty seeing, even when wearing glasses/contacts?: Yes Does the patient have difficulty concentrating, remembering, or making decisions?: Yes  Permission Sought/Granted                  Emotional Assessment              Admission diagnosis:  Acute cystitis with hematuria [N30.01] AKI (acute kidney injury) (HCC) [N17.9] Altered mental status, unspecified altered mental status type [R41.82] Acute kidney injury superimposed on chronic kidney disease (HCC) [N17.9, N18.9] Patient Active Problem List   Diagnosis Date Noted   Severe sepsis (HCC) 01/27/2024   Class 1 obesity 01/27/2024   Acute lower UTI 01/25/2024   Acute encephalopathy 01/25/2024   Leukocytosis 01/25/2024   History of pulmonary embolus (PE) 01/25/2024   Paroxysmal atrial flutter  (HCC) 01/25/2024   Acute pulmonary embolism (HCC) 06/12/2023   DVT, lower extremity, proximal, acute, right (HCC) 06/12/2023   Dyslipidemia 06/12/2023   Essential hypertension 06/12/2023   Acute renal failure superimposed on stage 4 chronic kidney disease (HCC) 06/12/2023   BPH (benign prostatic hyperplasia) 06/12/2023   Ischemic cardiomyopathy 06/12/2019   Cardiac pacemaker in situ 06/12/2019   PVC's (premature ventricular contractions) 06/12/2019   Atrial flutter (HCC) 06/12/2019   Pleural effusion, left 09/24/2018   Hemothorax on left 09/22/2018   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 [place] [address] Phone: [phone] Fax: [phone]  [id number] - [place], [place] - [address] [place] [place] [address] Phone: [phone]  Fax: [phone]     Social Drivers of Health (SDOH) Social History: SDOH Screenings   Food Insecurity: No Food Insecurity (01/25/2024)  Housing: Low Risk  (01/25/2024)  Transportation Needs: No Transportation Needs (01/25/2024)  Utilities: Not At Risk (01/25/2024)  Depression (PHQ2-9): Low Risk  (09/13/2019)  Financial Resource Strain: Low Risk  (11/09/2022)   Received from [facility], [facility]  Social Connections: Socially Integrated (01/26/2024)  Tobacco Use: Low Risk  (01/25/2024)   SDOH Interventions:     Readmission Risk Interventions     No data to display

## 2024-01-30 NOTE — Progress Notes (Signed)
 PROGRESS NOTE    Sean [name].  ZOX:[id number] DOB: 1941/02/18 DOA: 01/25/2024 PCP: Jerl Mina, MD  [id number]  LOS: 5 days   Brief hospital course:   Assessment & Plan: Sean Shaneyfelt. is a 83 y.o. Caucasian male with medical history significant for osteoarthritis, asthma, coronary artery disease, chronic systolic CHF, GERD, stage III CKD, essential hypertension and obesity, who presented to the emergency room with acute onset of generalized weakness altered mental status. He had temperature 100.3, severe leukocytosis 32.8, lactic acid 2.2, she also has worsening renal function. Patient also has abnormal urine, diagnosed with urinary tract infection.  Placed on IV fluids and antibiotics.    Acute renal failure superimposed on stage 4 chronic kidney disease (HCC) Renal function much worse due to severe sepsis.  Bladder scan did not show any urinary tension, he has a chronic Foley catheter since July last year. Initial CT scan showed distended bladder, however, bladder scan performed 3/9 showed a residual of 0.  He has been treated with normal saline, renal function finally improved this morning.  --cont MIVF   Severe sepsis secondary to urinary tract infection. Acute metabolic encephalopathy secondary to urinary tract infection. Urinary tract infection secondary to chronic indwelling Foley catheter. Benign prostatic hypertrophy with the Foley catheter. Patient has a chronic Foley catheter since July last year.  Followed by urology as outpatient.  Bladder scan showed 0 residual. Patient has significant altered mental status time of admission. Patient also had fever, severe leukocytosis, lactic acidosis and worsening renal function.  Condition consistent with severe sepsis. It appears that blood culture was sent out at admission, urine culture grew E. coli. --received 4 days of ceftriaxone f/b Keflex --cont Keflex  Essential hypertension --cont Toprol    Hyponatremia --likely due to dehydration.  Improved with MIVF --monitor  Dyslipidemia - ontinue statin therapy.   BPH (benign prostatic hyperplasia) - We will continue Flomax.   Gout - We will continue allopurinol.   Paroxysmal atrial flutter (HCC) - We will continue Toprol-XL --cont Eliquis   History of pulmonary embolus (PE) - We will continue Eliquis.   Coronary artery disease. --cont statin  Chronic combined systolic diastolic congestive heart failure. Conditions are stable. --cont Toprol     DVT prophylaxis: WJ:[id number] Code Status: DNR  Family Communication: daughter updated at bedside today Level of care: Telemetry Medical Dispo:   The patient is from: home Anticipated d/c is to: home Anticipated d/c date is: 2-3 days   Subjective and Interval History:  Pt had no complaints.   Objective: Vitals:   01/29/24 1528 01/29/24 2223 01/30/24 0357 01/30/24 0821  BP: 131/78 (!) 177/81 (!) 151/80 (!) 165/82  Pulse: 70 69 70 70  Resp: 18 20 18 18   Temp: 98.3 F (36.8 C) 98.2 F (36.8 C) 98.2 F (36.8 C) 98 F (36.7 C)  TempSrc: Oral     SpO2: 97% 100% 97% 98%  Weight:      Height:        Intake/Output Summary (Last 24 hours) at 01/30/2024 1911 Last data filed at 01/30/2024 1500 Gross per 24 hour  Intake 300 ml  Output 2500 ml  Net -2200 ml   Filed Weights   01/26/24 1130  Weight: 101.6 kg    Examination:   Constitutional: NAD, sleepy but arousable HEENT: conjunctivae and lids normal, EOMI CV: No cyanosis.   RESP: normal respiratory effort, on RA   Data Reviewed: I have personally reviewed labs and imaging studies  Time spent:  50 minutes  Darlin Priestly, MD [facility] If 7PM-7AM, please contact night-coverage 01/30/2024, 7:11 PM

## 2024-01-30 NOTE — Discharge Instructions (Signed)
 [facility] They will call you to set up when they will come to your home for assessment   [address], [place], [place] [address] Hours:  Open 24 hours Phone: [phone]

## 2024-01-30 NOTE — Progress Notes (Signed)
 [facility]  ROUNDING NOTE   Subjective:   Sean [name]. Is a 83 y.o. male with past medical history of CHF, diabetes, CAD s/p CABG, hypertension and chornic kidney disease stage IV.  Patient presents to the [facility] department with altered mental status and weakness and has been admitted for Acute cystitis with hematuria [N30.01] AKI (acute kidney injury) (HCC) [N17.9] Altered mental status, unspecified altered mental status type [R41.82] Acute kidney injury superimposed on chronic kidney disease (HCC) [N17.9, N18.9]  Patient is known to our practice from previous admission and is followed in our office by Dr. Suezanne Jacquet.    Update: Patient resting in bed More alert today Wife arrived during visit States appetite is not at baseline but improved some yesterday.    Creatinine 3.90 from 4.91 Urine output 4L   Objective:  Vital signs in last 24 hours:  Temp:  [98 F (36.7 C)-98.3 F (36.8 C)] 98 F (36.7 C) (03/12 0821) Pulse Rate:  [69-70] 70 (03/12 0821) Resp:  [18-20] 18 (03/12 0821) BP: (131-177)/(78-82) 165/82 (03/12 0821) SpO2:  [97 %-100 %] 98 % (03/12 0821)  Weight change:  Filed Weights   01/26/24 1130  Weight: 101.6 kg    Intake/Output: I/O last 3 completed shifts: In: 420 [P.O.:420] Out: 6100 [Urine:6100]   Intake/Output this shift:  Total I/O In: 240 [P.O.:240] Out: -   Physical Exam: General: NAD, resting comfortably  Head: Normocephalic, atraumatic.  Dry oral mucosal membranes  Eyes: Anicteric  Lungs:  Clear to auscultation, normal effort  Heart: Regular rate and rhythm  Abdomen:  Soft, nontender  Extremities: trace peripheral edema.  Neurologic: Alert and awake, moving all four extremities  Skin: No lesions  Access: None  GU Foley catheter  Basic Metabolic Panel: Recent Labs  Lab 01/25/24 1516 01/26/24 0448 01/27/24 0629 01/28/24 0458 01/30/24 0455  NA 130* 131* 132* 135 137  K 4.8 4.8 4.6 4.5 4.3  CL 95* 98 101 101 105   CO2 22 23 23 24 22   GLUCOSE 144* 142* 132* 119* 127*  BUN 73* 79* 83* 81* 68*  CREATININE 4.76* 4.97* 4.87* 4.91* 3.90*  CALCIUM 9.9 8.9 8.5* 8.5* 8.0*    Liver Function Tests: Recent Labs  Lab 01/25/24 1516 01/27/24 0629  AST 30 23  ALT 18 18  ALKPHOS 54 49  BILITOT 1.8* 0.7  PROT 6.7 5.3*  ALBUMIN 3.8 2.5*   No results for input(s): "LIPASE", "AMYLASE" in the last 168 hours. No results for input(s): "AMMONIA" in the last 168 hours.  CBC: Recent Labs  Lab 01/25/24 1516 01/26/24 0448 01/27/24 0629  WBC 32.8* 31.2* 21.4*  NEUTROABS 26.5*  --   --   HGB 10.7* 9.6* 9.3*  HCT 31.5* 28.0* 27.3*  MCV 96.3 97.2 96.8  PLT 145* 129* 133*    Cardiac Enzymes: No results for input(s): "CKTOTAL", "CKMB", "CKMBINDEX", "TROPONINI" in the last 168 hours.  BNP: Invalid input(s): "POCBNP"  CBG: No results for input(s): "GLUCAP" in the last 168 hours.  Microbiology: Results for orders placed or performed during the hospital encounter of 01/25/24  Resp panel by RT-PCR (RSV, Flu A&B, Covid) Anterior Nasal Swab     Status: None   Collection Time: 01/25/24  3:16 PM   Specimen: Anterior Nasal Swab  Result Value Ref Range Status   SARS Coronavirus 2 by RT PCR NEGATIVE NEGATIVE Final    Comment: (NOTE) SARS-CoV-2 target nucleic acids are NOT DETECTED.  The SARS-CoV-2 RNA is generally detectable in upper  respiratory specimens during the acute phase of infection. The lowest concentration of SARS-CoV-2 viral copies this assay can detect is 138 copies/mL. A negative result does not preclude SARS-Cov-2 infection and should not be used as the sole basis for treatment or other patient management decisions. A negative result may occur with  improper specimen collection/handling, submission of specimen other than nasopharyngeal swab, presence of viral mutation(s) within the areas targeted by this assay, and inadequate number of viral copies(<138 copies/mL). A negative result must be  combined with clinical observations, patient history, and epidemiological information. The expected result is Negative.  Fact Sheet for Patients:  BloggerCourse.com  Fact Sheet for Healthcare Providers:  SeriousBroker.it  This test is no t yet approved or cleared by the [place] FDA and  has been authorized for detection and/or diagnosis of SARS-CoV-2 by FDA under an Emergency Use Authorization (EUA). This EUA will remain  in effect (meaning this test can be used) for the duration of the COVID-19 declaration under Section 564(b)(1) of the Act, 21 U.S.C.section 360bbb-3(b)(1), unless the authorization is terminated  or revoked sooner.       Influenza A by PCR NEGATIVE NEGATIVE Final   Influenza B by PCR NEGATIVE NEGATIVE Final    Comment: (NOTE) The Xpert Xpress SARS-CoV-2/FLU/RSV plus assay is intended as an aid in the diagnosis of influenza from Nasopharyngeal swab specimens and should not be used as a sole basis for treatment. Nasal washings and aspirates are unacceptable for Xpert Xpress SARS-CoV-2/FLU/RSV testing.  Fact Sheet for Patients: BloggerCourse.com  Fact Sheet for Healthcare Providers: SeriousBroker.it  This test is not yet approved or cleared by the [place] FDA and has been authorized for detection and/or diagnosis of SARS-CoV-2 by FDA under an Emergency Use Authorization (EUA). This EUA will remain in effect (meaning this test can be used) for the duration of the COVID-19 declaration under Section 564(b)(1) of the Act, 21 U.S.C. section 360bbb-3(b)(1), unless the authorization is terminated or revoked.     Resp Syncytial Virus by PCR NEGATIVE NEGATIVE Final    Comment: (NOTE) Fact Sheet for Patients: BloggerCourse.com  Fact Sheet for Healthcare Providers: SeriousBroker.it  This test is not yet  approved or cleared by the [place] FDA and has been authorized for detection and/or diagnosis of SARS-CoV-2 by FDA under an Emergency Use Authorization (EUA). This EUA will remain in effect (meaning this test can be used) for the duration of the COVID-19 declaration under Section 564(b)(1) of the Act, 21 U.S.C. section 360bbb-3(b)(1), unless the authorization is terminated or revoked.  Performed at [facility], [address] [place], [place] [address]   Urine Culture     Status: Abnormal   Collection Time: 01/25/24  5:42 PM   Specimen: Urine, Random  Result Value Ref Range Status   Specimen Description   Final    URINE, RANDOM Performed at [facility], [address] [place], [place] [address]    Special Requests   Final    NONE Reflexed from [phone] [id number] Performed at [facility], [address] [place], [place] [address]    Culture >=100,000 COLONIES/mL ESCHERICHIA COLI (A)  Final   Report Status 01/28/2024 FINAL  Final   Organism ID, Bacteria ESCHERICHIA COLI (A)  Final      Susceptibility   Escherichia coli - MIC*    AMPICILLIN 4 SENSITIVE Sensitive     CEFAZOLIN <=4 SENSITIVE Sensitive     CEFEPIME <=0.12 SENSITIVE Sensitive     CEFTRIAXONE <=0.25 SENSITIVE  Sensitive     CIPROFLOXACIN <=0.25 SENSITIVE Sensitive     GENTAMICIN <=1 SENSITIVE Sensitive     IMIPENEM <=0.25 SENSITIVE Sensitive     NITROFURANTOIN <=16 SENSITIVE Sensitive     TRIMETH/SULFA <=20 SENSITIVE Sensitive     AMPICILLIN/SULBACTAM <=2 SENSITIVE Sensitive     PIP/TAZO <=4 SENSITIVE Sensitive ug/mL    * >=100,000 COLONIES/mL ESCHERICHIA COLI    Coagulation Studies: No results for input(s): "LABPROT", "INR" in the last 72 hours.  Urinalysis: No results for input(s): "COLORURINE", "LABSPEC", "PHURINE", "GLUCOSEU", "HGBUR", "BILIRUBINUR", "KETONESUR", "PROTEINUR", "UROBILINOGEN", "NITRITE", "LEUKOCYTESUR" in the last 72 hours.  Invalid input(s): "APPERANCEUR"      Imaging: No results found.   Medications:    sodium chloride      allopurinol  100 mg Oral Daily   apixaban  5 mg Oral BID   atorvastatin  40 mg Oral Daily   cephALEXin  500 mg Oral Q12H   Chlorhexidine Gluconate Cloth  6 each Topical Daily   feeding supplement (NEPRO CARB STEADY)  237 mL Oral BID BM   ferrous sulfate  325 mg Oral BID WC   loratadine  10 mg Oral Daily   metoprolol succinate  25 mg Oral Daily   multivitamin with minerals  1 tablet Oral Daily   oxybutynin  10 mg Oral Daily   pantoprazole  40 mg Oral BID   tamsulosin  0.4 mg Oral Daily   acetaminophen **OR** acetaminophen, albuterol, calcium carbonate, labetalol, ondansetron **OR** ondansetron (ZOFRAN) IV, mouth rinse, traZODone  Assessment/ Plan:  Mr. Sean [name]. is a 83 y.o.  male with past medical history of CHF, diabetes, CAD s/p CABG, hypertension and chornic kidney disease stage IV.  Patient presents to the [facility] department with altered mental status and weakness and has been admitted for Acute cystitis with hematuria [N30.01] AKI (acute kidney injury) (HCC) [N17.9] Altered mental status, unspecified altered mental status type [R41.82] Acute kidney injury superimposed on chronic kidney disease (HCC) [N17.9, N18.9]   Acute Kidney Injury on chronic kidney disease stage 4 with baseline creatinine 2.8 and GFR of 22 on 01/10/2024.  Acute kidney injury secondary to poor oral intake with concurrent infection.  UA positive for UTI with culture indicating E. coli.  No IV contrast exposure. Renal ultrasound shows simple cyst without obstruction.    Creatinine greatly improved today with decent urine output. No acute indication for dialysis. Will restart IVF, 0.9% normal saline at 56ml/hr.  Lab Results  Component Value Date   CREATININE 3.90 (H) 01/30/2024   CREATININE 4.91 (H) 01/28/2024   CREATININE 4.87 (H) 01/27/2024    Intake/Output Summary (Last 24 hours) at 01/30/2024 1139 Last data filed at  01/30/2024 1100 Gross per 24 hour  Intake 540 ml  Output 4000 ml  Net -3460 ml   2.  Hyponatremia likely secondary to kidney injury.    Sodium 137  3. Anemia of chronic kidney disease Lab Results  Component Value Date   HGB 9.3 (L) 01/27/2024    No updated labs available.   4.  Hypertension with chronic kidney disease.  Home regimen includes losartan, metoprolol, and spironolactone.  Patient also prescribed tamsulosin.  Only receiving metoprolol. Blood pressure remains stable for this patient, 165/82.    LOS: 5 Marylon Verno 3/12/202511:39 AM

## 2024-01-31 DIAGNOSIS — N184 Chronic kidney disease, stage 4 (severe): Secondary | ICD-10-CM | POA: Diagnosis not present

## 2024-01-31 DIAGNOSIS — N178 Other acute kidney failure: Secondary | ICD-10-CM | POA: Diagnosis not present

## 2024-01-31 LAB — CBC
HCT: 31.1 % — ABNORMAL LOW (ref 39.0–52.0)
Hemoglobin: 10.4 g/dL — ABNORMAL LOW (ref 13.0–17.0)
MCH: 32.2 pg (ref 26.0–34.0)
MCHC: 33.4 g/dL (ref 30.0–36.0)
MCV: 96.3 fL (ref 80.0–100.0)
Platelets: 194 10*3/uL (ref 150–400)
RBC: 3.23 MIL/uL — ABNORMAL LOW (ref 4.22–5.81)
RDW: 13.3 % (ref 11.5–15.5)
WBC: 14.5 10*3/uL — ABNORMAL HIGH (ref 4.0–10.5)
nRBC: 0 % (ref 0.0–0.2)

## 2024-01-31 LAB — BASIC METABOLIC PANEL
Anion gap: 9 (ref 5–15)
BUN: 62 mg/dL — ABNORMAL HIGH (ref 8–23)
CO2: 23 mmol/L (ref 22–32)
Calcium: 7.9 mg/dL — ABNORMAL LOW (ref 8.9–10.3)
Chloride: 104 mmol/L (ref 98–111)
Creatinine, Ser: 3.61 mg/dL — ABNORMAL HIGH (ref 0.61–1.24)
GFR, Estimated: 16 mL/min — ABNORMAL LOW (ref >60)
Glucose, Bld: 122 mg/dL — ABNORMAL HIGH (ref 70–99)
Potassium: 4.2 mmol/L (ref 3.5–5.1)
Sodium: 136 mmol/L (ref 135–145)

## 2024-01-31 LAB — MAGNESIUM: Magnesium: 1.8 mg/dL (ref 1.7–2.4)

## 2024-01-31 NOTE — Plan of Care (Signed)
  Problem: Clinical Measurements: Goal: Respiratory complications will improve Outcome: Progressing   Problem: Activity: Goal: Risk for activity intolerance will decrease Outcome: Progressing   Problem: Nutrition: Goal: Adequate nutrition will be maintained Outcome: Progressing   Problem: Elimination: Goal: Will not experience complications related to bowel motility Outcome: Progressing Goal: Will not experience complications related to urinary retention Outcome: Progressing   Problem: Pain Managment: Goal: General experience of comfort will improve and/or be controlled Outcome: Progressing   Problem: Safety: Goal: Ability to remain free from injury will improve Outcome: Progressing

## 2024-01-31 NOTE — Progress Notes (Signed)
 [facility]  ROUNDING NOTE   Subjective:   Sean [name]. Is a 83 y.o. male with past medical history of CHF, diabetes, CAD s/p CABG, hypertension and chornic kidney disease stage IV.  Patient presents to the [facility] department with altered mental status and weakness and has been admitted for Acute cystitis with hematuria [N30.01] AKI (acute kidney injury) (HCC) [N17.9] Altered mental status, unspecified altered mental status type [R41.82] Acute kidney injury superimposed on chronic kidney disease (HCC) [N17.9, N18.9]  Patient is known to our practice from previous admission and is followed in our office by Dr. Suezanne Jacquet.    Update: Patient resting in bed More alert today Serum creatinine continues to improve. No significant leg edema today. Urine output of about 2 L last 24 hours.   Objective:  Vital signs in last 24 hours:  Temp:  [97.8 F (36.6 C)-99.4 F (37.4 C)] 97.8 F (36.6 C) (03/13 0827) Pulse Rate:  [69-70] 70 (03/13 1027) Resp:  [18] 18 (03/13 0827) BP: (151-172)/(77-88) 151/82 (03/13 1027) SpO2:  [98 %-100 %] 99 % (03/13 0827)  Weight change:  Filed Weights   01/26/24 1130  Weight: 101.6 kg    Intake/Output: I/O last 3 completed shifts: In: 540 [P.O.:540] Out: 4500 [Urine:4500]   Intake/Output this shift:  Total I/O In: 240 [P.O.:240] Out: 1100 [Urine:1100]  Physical Exam: General: NAD, resting comfortably  Head: Normocephalic, atraumatic.  Dry oral mucosal membranes  Eyes: Anicteric  Lungs:  Clear to auscultation, normal effort  Heart: Regular rate and rhythm  Abdomen:  Soft, nontender  Extremities: trace peripheral edema.  Neurologic: Alert and awake, moving all four extremities  Skin: No lesions  GU Foley catheter  Basic Metabolic Panel: Recent Labs  Lab 01/26/24 0448 01/27/24 0629 01/28/24 0458 01/30/24 0455 01/31/24 0510  NA 131* 132* 135 137 136  K 4.8 4.6 4.5 4.3 4.2  CL 98 101 101 105 104  CO2 23 23 24 22 23    GLUCOSE 142* 132* 119* 127* 122*  BUN 79* 83* 81* 68* 62*  CREATININE 4.97* 4.87* 4.91* 3.90* 3.61*  CALCIUM 8.9 8.5* 8.5* 8.0* 7.9*  MG  --   --   --   --  1.8    Liver Function Tests: Recent Labs  Lab 01/25/24 1516 01/27/24 0629  AST 30 23  ALT 18 18  ALKPHOS 54 49  BILITOT 1.8* 0.7  PROT 6.7 5.3*  ALBUMIN 3.8 2.5*   No results for input(s): "LIPASE", "AMYLASE" in the last 168 hours. No results for input(s): "AMMONIA" in the last 168 hours.  CBC: Recent Labs  Lab 01/25/24 1516 01/26/24 0448 01/27/24 0629 01/31/24 0510  WBC 32.8* 31.2* 21.4* 14.5*  NEUTROABS 26.5*  --   --   --   HGB 10.7* 9.6* 9.3* 10.4*  HCT 31.5* 28.0* 27.3* 31.1*  MCV 96.3 97.2 96.8 96.3  PLT 145* 129* 133* 194    Cardiac Enzymes: No results for input(s): "CKTOTAL", "CKMB", "CKMBINDEX", "TROPONINI" in the last 168 hours.  BNP: Invalid input(s): "POCBNP"  CBG: No results for input(s): "GLUCAP" in the last 168 hours.  Microbiology: Results for orders placed or performed during the hospital encounter of 01/25/24  Resp panel by RT-PCR (RSV, Flu A&B, Covid) Anterior Nasal Swab     Status: None   Collection Time: 01/25/24  3:16 PM   Specimen: Anterior Nasal Swab  Result Value Ref Range Status   SARS Coronavirus 2 by RT PCR NEGATIVE NEGATIVE Final    Comment: (  NOTE) SARS-CoV-2 target nucleic acids are NOT DETECTED.  The SARS-CoV-2 RNA is generally detectable in upper respiratory specimens during the acute phase of infection. The lowest concentration of SARS-CoV-2 viral copies this assay can detect is 138 copies/mL. A negative result does not preclude SARS-Cov-2 infection and should not be used as the sole basis for treatment or other patient management decisions. A negative result may occur with  improper specimen collection/handling, submission of specimen other than nasopharyngeal swab, presence of viral mutation(s) within the areas targeted by this assay, and inadequate number of  viral copies(<138 copies/mL). A negative result must be combined with clinical observations, patient history, and epidemiological information. The expected result is Negative.  Fact Sheet for Patients:  BloggerCourse.com  Fact Sheet for Healthcare Providers:  SeriousBroker.it  This test is no t yet approved or cleared by the [place] FDA and  has been authorized for detection and/or diagnosis of SARS-CoV-2 by FDA under an Emergency Use Authorization (EUA). This EUA will remain  in effect (meaning this test can be used) for the duration of the COVID-19 declaration under Section 564(b)(1) of the Act, 21 U.S.C.section 360bbb-3(b)(1), unless the authorization is terminated  or revoked sooner.       Influenza A by PCR NEGATIVE NEGATIVE Final   Influenza B by PCR NEGATIVE NEGATIVE Final    Comment: (NOTE) The Xpert Xpress SARS-CoV-2/FLU/RSV plus assay is intended as an aid in the diagnosis of influenza from Nasopharyngeal swab specimens and should not be used as a sole basis for treatment. Nasal washings and aspirates are unacceptable for Xpert Xpress SARS-CoV-2/FLU/RSV testing.  Fact Sheet for Patients: BloggerCourse.com  Fact Sheet for Healthcare Providers: SeriousBroker.it  This test is not yet approved or cleared by the [place] FDA and has been authorized for detection and/or diagnosis of SARS-CoV-2 by FDA under an Emergency Use Authorization (EUA). This EUA will remain in effect (meaning this test can be used) for the duration of the COVID-19 declaration under Section 564(b)(1) of the Act, 21 U.S.C. section 360bbb-3(b)(1), unless the authorization is terminated or revoked.     Resp Syncytial Virus by PCR NEGATIVE NEGATIVE Final    Comment: (NOTE) Fact Sheet for Patients: BloggerCourse.com  Fact Sheet for Healthcare  Providers: SeriousBroker.it  This test is not yet approved or cleared by the [place] FDA and has been authorized for detection and/or diagnosis of SARS-CoV-2 by FDA under an Emergency Use Authorization (EUA). This EUA will remain in effect (meaning this test can be used) for the duration of the COVID-19 declaration under Section 564(b)(1) of the Act, 21 U.S.C. section 360bbb-3(b)(1), unless the authorization is terminated or revoked.  Performed at [facility], [address], [place], [place] [address]   Urine Culture     Status: Abnormal   Collection Time: 01/25/24  5:42 PM   Specimen: Urine, Random  Result Value Ref Range Status   Specimen Description   Final    URINE, RANDOM Performed at [facility], [address] [place], [place] [address]    Special Requests   Final    NONE Reflexed from [id number] Performed at [facility], [address] [place], [place] [address]    Culture >=100,000 COLONIES/mL ESCHERICHIA COLI (A)  Final   Report Status 01/28/2024 FINAL  Final   Organism ID, Bacteria ESCHERICHIA COLI (A)  Final      Susceptibility   Escherichia coli - MIC*    AMPICILLIN 4 SENSITIVE Sensitive     CEFAZOLIN <=4  SENSITIVE Sensitive     CEFEPIME <=0.12 SENSITIVE Sensitive     CEFTRIAXONE <=0.25 SENSITIVE Sensitive     CIPROFLOXACIN <=0.25 SENSITIVE Sensitive     GENTAMICIN <=1 SENSITIVE Sensitive     IMIPENEM <=0.25 SENSITIVE Sensitive     NITROFURANTOIN <=16 SENSITIVE Sensitive     TRIMETH/SULFA <=20 SENSITIVE Sensitive     AMPICILLIN/SULBACTAM <=2 SENSITIVE Sensitive     PIP/TAZO <=4 SENSITIVE Sensitive ug/mL    * >=100,000 COLONIES/mL ESCHERICHIA COLI    Coagulation Studies: No results for input(s): "LABPROT", "INR" in the last 72 hours.  Urinalysis: No results for input(s): "COLORURINE", "LABSPEC", "PHURINE", "GLUCOSEU", "HGBUR", "BILIRUBINUR", "KETONESUR", "PROTEINUR", "UROBILINOGEN", "NITRITE",  "LEUKOCYTESUR" in the last 72 hours.  Invalid input(s): "APPERANCEUR"     Imaging: No results found.   Medications:    sodium chloride 50 mL/hr at 01/30/24 1438    allopurinol  100 mg Oral Daily   apixaban  5 mg Oral BID   atorvastatin  40 mg Oral Daily   cephALEXin  500 mg Oral Q8H   Chlorhexidine Gluconate Cloth  6 each Topical Daily   feeding supplement (NEPRO CARB STEADY)  237 mL Oral BID BM   ferrous sulfate  325 mg Oral BID WC   loratadine  10 mg Oral Daily   metoprolol succinate  25 mg Oral Daily   multivitamin with minerals  1 tablet Oral Daily   oxybutynin  10 mg Oral Daily   pantoprazole  40 mg Oral BID   tamsulosin  0.4 mg Oral Daily   acetaminophen **OR** acetaminophen, albuterol, calcium carbonate, labetalol, ondansetron **OR** ondansetron (ZOFRAN) IV, mouth rinse, traZODone  Assessment/ Plan:  Sean [name]. is a 83 y.o.  male with past medical history of CHF, diabetes, CAD s/p CABG, hypertension and chornic kidney disease stage IV.  Patient presents to the [facility] department with altered mental status and weakness and has been admitted for Acute cystitis with hematuria [N30.01] AKI (acute kidney injury) (HCC) [N17.9] Altered mental status, unspecified altered mental status type [R41.82] Acute kidney injury superimposed on chronic kidney disease (HCC) [N17.9, N18.9]   Acute Kidney Injury on chronic kidney disease stage 4 with baseline creatinine 2.8 and GFR of 22 on 01/10/2024.  Acute kidney injury secondary to poor oral intake with concurrent infection.  No IV contrast exposure. UA positive for UTI with culture indicating E. coli.   Renal ultrasound shows simple cyst without obstruction.     Creatinine greatly improved today with decent urine output.  No acute indication for dialysis.  Continue IVF, 0.9% normal saline at 41ml/hr. until oral intake is adequate  Lab Results  Component Value Date   CREATININE 3.61 (H) 01/31/2024   CREATININE 3.90 (H)  01/30/2024   CREATININE 4.91 (H) 01/28/2024    Intake/Output Summary (Last 24 hours) at 01/31/2024 1217 Last data filed at 01/31/2024 1100 Gross per 24 hour  Intake 240 ml  Output 3100 ml  Net -2860 ml   2.  Hyponatremia likely secondary to kidney injury.    Sodium 136 today  3. Anemia of chronic kidney disease Lab Results  Component Value Date   HGB 10.4 (L) 01/31/2024  Hemoglobin within acceptable range.  4.  Hypertension with chronic kidney disease.  Home regimen includes losartan, metoprolol, and spironolactone.  Patient also prescribed tamsulosin.  Only receiving metoprolol.     LOS: 6 Melissia Lahman 3/13/202512:17 PM

## 2024-01-31 NOTE — Progress Notes (Signed)
 PROGRESS NOTE    Sean [name].  UJW:[id number] DOB: 06/19/1941 DOA: 01/25/2024 PCP: Jerl Mina, MD  [id number]/[id number]  LOS: 6 days   Brief hospital course:   Assessment & Plan: Sean [name]. is a 83 y.o. Caucasian male with medical history significant for osteoarthritis, asthma, coronary artery disease, chronic systolic CHF, GERD, stage III CKD, essential hypertension and obesity, who presented to the emergency room with acute onset of generalized weakness altered mental status. He had temperature 100.3, severe leukocytosis 32.8, lactic acid 2.2, she also has worsening renal function. Patient also has abnormal urine, diagnosed with urinary tract infection.  Placed on IV fluids and antibiotics.    Acute renal failure superimposed on stage 4 chronic kidney disease (HCC) Renal function much worse due to severe sepsis.  Bladder scan did not show any urinary tension, he has a chronic Foley catheter since July last year. Initial CT scan showed distended bladder, however, bladder scan performed 3/9 showed a residual of 0.  He has been treated with normal saline, renal function finally improved this morning.  --cont MIVF per nephro   Severe sepsis secondary to urinary tract infection. Acute metabolic encephalopathy secondary to urinary tract infection. Urinary tract infection secondary to chronic indwelling Foley catheter. Benign prostatic hypertrophy with the Foley catheter. Patient has a chronic Foley catheter since July last year.  Followed by urology as outpatient.  Bladder scan showed 0 residual. Patient has significant altered mental status time of admission. Patient also had fever, severe leukocytosis, lactic acidosis and worsening renal function.  Condition consistent with severe sepsis. It appears that blood culture was sent out at admission, urine culture grew E. coli. --received 4 days of ceftriaxone f/b Keflex --cont Klefex  Essential hypertension --cont Toprol    Hyponatremia --likely due to dehydration.  Improved with MIVF --monitor  Dyslipidemia - ontinue statin therapy.   BPH (benign prostatic hyperplasia) - We will continue Flomax.   Gout - We will continue allopurinol.   Paroxysmal atrial flutter (HCC) --cont Toprol --cont Eliquis   History of pulmonary embolus (PE) --cont Eliquis   Coronary artery disease. --cont statin  Chronic combined systolic diastolic congestive heart failure. Conditions are stable. --cont Toprol     DVT prophylaxis: [id number] Code Status: DNR  Family Communication:  Level of care: Telemetry Medical Dispo:   The patient is from: home Anticipated d/c is to: home Anticipated d/c date is: 2-3 days   Subjective and Interval History:  Pt had no complaints.   Objective: Vitals:   01/31/24 0407 01/31/24 0827 01/31/24 1027 01/31/24 1403  BP: (!) 162/77 (!) 172/88 (!) 151/82 (!) 160/72  Pulse: 69 69 70 69  Resp: 18 18  18   Temp: 99.1 F (37.3 C) 97.8 F (36.6 C)  98.2 F (36.8 C)  TempSrc: Oral Oral    SpO2: 98% 99%    Weight:      Height:        Intake/Output Summary (Last 24 hours) at 01/31/2024 1902 Last data filed at 01/31/2024 1800 Gross per 24 hour  Intake 1700.23 ml  Output 4400 ml  Net -2699.77 ml   Filed Weights   01/26/24 1130  Weight: 101.6 kg    Examination:   Constitutional: NAD, AAOx3 HEENT: conjunctivae and lids normal, EOMI CV: No cyanosis.   RESP: normal respiratory effort, on RA Neuro: II - XII grossly intact.     Data Reviewed: I have personally reviewed labs and imaging studies  Time spent: 35 minutes  Inetta Fermo  Fran Lowes, MD [facility] If 7PM-7AM, please contact night-coverage 01/31/2024, 7:02 PM

## 2024-02-01 DIAGNOSIS — N184 Chronic kidney disease, stage 4 (severe): Secondary | ICD-10-CM | POA: Diagnosis not present

## 2024-02-01 DIAGNOSIS — N178 Other acute kidney failure: Secondary | ICD-10-CM | POA: Diagnosis not present

## 2024-02-01 LAB — CBC
HCT: 31.5 % — ABNORMAL LOW (ref 39.0–52.0)
Hemoglobin: 10.5 g/dL — ABNORMAL LOW (ref 13.0–17.0)
MCH: 32.1 pg (ref 26.0–34.0)
MCHC: 33.3 g/dL (ref 30.0–36.0)
MCV: 96.3 fL (ref 80.0–100.0)
Platelets: 224 10*3/uL (ref 150–400)
RBC: 3.27 MIL/uL — ABNORMAL LOW (ref 4.22–5.81)
RDW: 13.3 % (ref 11.5–15.5)
WBC: 16.4 10*3/uL — ABNORMAL HIGH (ref 4.0–10.5)
nRBC: 0 % (ref 0.0–0.2)

## 2024-02-01 LAB — BASIC METABOLIC PANEL
Anion gap: 9 (ref 5–15)
BUN: 55 mg/dL — ABNORMAL HIGH (ref 8–23)
CO2: 24 mmol/L (ref 22–32)
Calcium: 8 mg/dL — ABNORMAL LOW (ref 8.9–10.3)
Chloride: 103 mmol/L (ref 98–111)
Creatinine, Ser: 3.15 mg/dL — ABNORMAL HIGH (ref 0.61–1.24)
GFR, Estimated: 19 mL/min — ABNORMAL LOW (ref >60)
Glucose, Bld: 141 mg/dL — ABNORMAL HIGH (ref 70–99)
Potassium: 4.7 mmol/L (ref 3.5–5.1)
Sodium: 136 mmol/L (ref 135–145)

## 2024-02-01 LAB — MAGNESIUM: Magnesium: 1.7 mg/dL (ref 1.7–2.4)

## 2024-02-01 MED ORDER — LOSARTAN POTASSIUM 25 MG PO TABS
12.5000 mg | ORAL_TABLET | Freq: Every day | ORAL | Status: DC
  Administered 2024-02-01 – 2024-02-02 (×2): 12.5 mg via ORAL
  Filled 2024-02-01 (×2): qty 1

## 2024-02-01 NOTE — Plan of Care (Signed)
   Problem: Activity: Goal: Risk for activity intolerance will decrease Outcome: Progressing   Problem: Nutrition: Goal: Adequate nutrition will be maintained Outcome: Progressing   Problem: Pain Managment: Goal: General experience of comfort will improve and/or be controlled Outcome: Progressing

## 2024-02-01 NOTE — Progress Notes (Signed)
 PROGRESS NOTE    Sean [name].  ZOX:[id number] DOB: [birth year] DOA: 01/25/2024 PCP: Jerl Mina, MD  214A/[id number]-AA  LOS: 7 days   Brief hospital course:   Assessment & Plan: Sean [name]. is a 83 y.o. Caucasian male with medical history significant for osteoarthritis, asthma, coronary artery disease, chronic systolic CHF, GERD, stage III CKD, essential hypertension and obesity, who presented to the emergency room with acute onset of generalized weakness altered mental status. He had temperature 100.3, severe leukocytosis 32.8, lactic acid 2.2, she also has worsening renal function. Patient also has abnormal urine, diagnosed with urinary tract infection.  Placed on IV fluids and antibiotics.    Acute renal failure superimposed on stage 4 chronic kidney disease (HCC) Renal function much worse due to severe sepsis.  Bladder scan did not show any urinary tension, he has a chronic Foley catheter since July last year. Initial CT scan showed distended bladder, however, bladder scan performed 3/9 showed a residual of 0.  He has been treated with normal saline, renal function finally improved this morning.  --cont MIVF   Severe sepsis secondary to urinary tract infection. Acute metabolic encephalopathy secondary to urinary tract infection. Patient had fever, severe leukocytosis, lactic acidosis and worsening renal function.  Condition consistent with severe sepsis. It appears that blood culture was sent out at admission, urine culture grew E. coli. --completed 4 days of ceftriaxone f/b Keflex  Acute urinary retention Ruled out chronic Foley --correction, per wife, pt did not have Foley PTA.  Foley was placed on 3/8 after bladder scan found urinary retention.  There was already outpatient concern for BPH, and pt was started on Flomax about a week PTA. --remove Foley today for voiding trial --bladder scan ordered  Essential hypertension --cont Toprol   Hyponatremia --likely due to  dehydration.  resolved with MIVF --monitor  Dyslipidemia - ontinue statin therapy.   BPH (benign prostatic hyperplasia) - We will continue Flomax.   Gout - We will continue allopurinol.   Paroxysmal atrial flutter (HCC) --cont Toprol --cont Eliquis   History of pulmonary embolus (PE) --cont Eliquis   Coronary artery disease. --cont statin  Chronic combined systolic diastolic congestive heart failure. Conditions are stable. --cont Toprol     DVT prophylaxis: [id number] Code Status: DNR  Family Communication: wife updated at bedside today Level of care: Telemetry Medical Dispo:   The patient is from: home Anticipated d/c is to: home Anticipated d/c date is: tomorrow   Subjective and Interval History:  No new complaint.     Objective: Vitals:   02/01/24 0507 02/01/24 0818 02/01/24 1454 02/01/24 1730  BP: (!) 153/78 (!) 165/73 (!) 154/76 139/69  Pulse: 69 69 69 70  Resp: 20 18 18  (!) 22  Temp: 98.4 F (36.9 C) 98.2 F (36.8 C) 98.7 F (37.1 C) 98.9 F (37.2 C)  TempSrc:   Oral Oral  SpO2: 99% 95% 100% 100%  Weight:      Height:        Intake/Output Summary (Last 24 hours) at 02/01/2024 1844 Last data filed at 02/01/2024 1500 Gross per 24 hour  Intake 240 ml  Output 3200 ml  Net -2960 ml   Filed Weights   01/26/24 1130  Weight: 101.6 kg    Examination:   Constitutional: NAD, awake HEENT: conjunctivae and lids normal, EOMI CV: No cyanosis.   RESP: normal respiratory effort, on RA Foley present   Data Reviewed: I have personally reviewed labs and imaging studies  Time  spent: 35 minutes  Darlin Priestly, MD [facility] If 7PM-7AM, please contact night-coverage 02/01/2024, 6:44 PM

## 2024-02-01 NOTE — Progress Notes (Signed)
 [facility]  ROUNDING NOTE   Subjective:   Sean [name]. Is a 83 y.o. male with past medical history of CHF, diabetes, CAD s/p CABG, hypertension and chornic kidney disease stage IV.  Patient presents to the [facility] department with altered mental status and weakness and has been admitted for Acute cystitis with hematuria [N30.01] AKI (acute kidney injury) (HCC) [N17.9] Altered mental status, unspecified altered mental status type [R41.82] Acute kidney injury superimposed on chronic kidney disease (HCC) [N17.9, N18.9]  Patient is known to our practice from previous admission and is followed in our office   Update: Patient resting in bed, with wife at his bedside More alert today Serum creatinine continues to improve. No significant leg edema today. Urine output of about 4.2 L last 24 hours.   Objective:  Vital signs in last 24 hours:  Temp:  [98.2 F (36.8 C)-98.6 F (37 C)] 98.2 F (36.8 C) (03/14 0818) Pulse Rate:  [67-69] 69 (03/14 0818) Resp:  [16-20] 18 (03/14 0818) BP: (153-165)/(72-84) 165/73 (03/14 0818) SpO2:  [95 %-100 %] 95 % (03/14 0818)  Weight change:  Filed Weights   01/26/24 1130  Weight: 101.6 kg    Intake/Output: I/O last 3 completed shifts: In: 1700.2 [P.O.:360; I.V.:1340.2] Out: 6200 [Urine:6200]   Intake/Output this shift:  Total I/O In: -  Out: 700 [Urine:700]  Physical Exam: General: NAD, resting comfortably  Head: Normocephalic, atraumatic.  Dry oral mucosal membranes  Eyes: Anicteric  Lungs:  Clear to auscultation, normal effort  Heart: Regular rate and rhythm  Abdomen:  Soft, nontender  Extremities: trace peripheral edema.  Neurologic: Alert and awake, moving all four extremities  Skin: No lesions  GU Foley catheter  Basic Metabolic Panel: Recent Labs  Lab 01/27/24 0629 01/28/24 0458 01/30/24 0455 01/31/24 0510 02/01/24 0435  NA 132* 135 137 136 136  K 4.6 4.5 4.3 4.2 4.7  CL 101 101 105 104 103  CO2 23 24 22  23 24   GLUCOSE 132* 119* 127* 122* 141*  BUN 83* 81* 68* 62* 55*  CREATININE 4.87* 4.91* 3.90* 3.61* 3.15*  CALCIUM 8.5* 8.5* 8.0* 7.9* 8.0*  MG  --   --   --  1.8 1.7    Liver Function Tests: Recent Labs  Lab 01/25/24 1516 01/27/24 0629  AST 30 23  ALT 18 18  ALKPHOS 54 49  BILITOT 1.8* 0.7  PROT 6.7 5.3*  ALBUMIN 3.8 2.5*   No results for input(s): "LIPASE", "AMYLASE" in the last 168 hours. No results for input(s): "AMMONIA" in the last 168 hours.  CBC: Recent Labs  Lab 01/25/24 1516 01/26/24 0448 01/27/24 0629 01/31/24 0510 02/01/24 0435  WBC 32.8* 31.2* 21.4* 14.5* 16.4*  NEUTROABS 26.5*  --   --   --   --   HGB 10.7* 9.6* 9.3* 10.4* 10.5*  HCT 31.5* 28.0* 27.3* 31.1* 31.5*  MCV 96.3 97.2 96.8 96.3 96.3  PLT 145* 129* 133* 194 224    Cardiac Enzymes: No results for input(s): "CKTOTAL", "CKMB", "CKMBINDEX", "TROPONINI" in the last 168 hours.  BNP: Invalid input(s): "POCBNP"  CBG: No results for input(s): "GLUCAP" in the last 168 hours.  Microbiology: Results for orders placed or performed during the hospital encounter of 01/25/24  Resp panel by RT-PCR (RSV, Flu A&B, Covid) Anterior Nasal Swab     Status: None   Collection Time: 01/25/24  3:16 PM   Specimen: Anterior Nasal Swab  Result Value Ref Range Status   SARS Coronavirus 2  by RT PCR NEGATIVE NEGATIVE Final    Comment: (NOTE) SARS-CoV-2 target nucleic acids are NOT DETECTED.  The SARS-CoV-2 RNA is generally detectable in upper respiratory specimens during the acute phase of infection. The lowest concentration of SARS-CoV-2 viral copies this assay can detect is 138 copies/mL. A negative result does not preclude SARS-Cov-2 infection and should not be used as the sole basis for treatment or other patient management decisions. A negative result may occur with  improper specimen collection/handling, submission of specimen other than nasopharyngeal swab, presence of viral mutation(s) within the areas  targeted by this assay, and inadequate number of viral copies(<138 copies/mL). A negative result must be combined with clinical observations, patient history, and epidemiological information. The expected result is Negative.  Fact Sheet for Patients:  BloggerCourse.com  Fact Sheet for Healthcare Providers:  SeriousBroker.it  This test is no t yet approved or cleared by the [place] FDA and  has been authorized for detection and/or diagnosis of SARS-CoV-2 by FDA under an Emergency Use Authorization (EUA). This EUA will remain  in effect (meaning this test can be used) for the duration of the COVID-19 declaration under Section 564(b)(1) of the Act, 21 U.S.C.section 360bbb-3(b)(1), unless the authorization is terminated  or revoked sooner.       Influenza A by PCR NEGATIVE NEGATIVE Final   Influenza B by PCR NEGATIVE NEGATIVE Final    Comment: (NOTE) The Xpert Xpress SARS-CoV-2/FLU/RSV plus assay is intended as an aid in the diagnosis of influenza from Nasopharyngeal swab specimens and should not be used as a sole basis for treatment. Nasal washings and aspirates are unacceptable for Xpert Xpress SARS-CoV-2/FLU/RSV testing.  Fact Sheet for Patients: BloggerCourse.com  Fact Sheet for Healthcare Providers: SeriousBroker.it  This test is not yet approved or cleared by the [place] FDA and has been authorized for detection and/or diagnosis of SARS-CoV-2 by FDA under an Emergency Use Authorization (EUA). This EUA will remain in effect (meaning this test can be used) for the duration of the COVID-19 declaration under Section 564(b)(1) of the Act, 21 U.S.C. section 360bbb-3(b)(1), unless the authorization is terminated or revoked.     Resp Syncytial Virus by PCR NEGATIVE NEGATIVE Final    Comment: (NOTE) Fact Sheet for  Patients: BloggerCourse.com  Fact Sheet for Healthcare Providers: SeriousBroker.it  This test is not yet approved or cleared by the [place] FDA and has been authorized for detection and/or diagnosis of SARS-CoV-2 by FDA under an Emergency Use Authorization (EUA). This EUA will remain in effect (meaning this test can be used) for the duration of the COVID-19 declaration under Section 564(b)(1) of the Act, 21 U.S.C. section 360bbb-3(b)(1), unless the authorization is terminated or revoked.  Performed at [facility], [address], [place], [place] [address]   Urine Culture     Status: Abnormal   Collection Time: 01/25/24  5:42 PM   Specimen: Urine, Random  Result Value Ref Range Status   Specimen Description   Final    URINE, RANDOM Performed at [facility], [address] [place], [place] [address]    Special Requests   Final    NONE Reflexed from [id number] Performed at [facility], [address] [place], [place] [address]    Culture >=100,000 COLONIES/mL ESCHERICHIA COLI (A)  Final   Report Status 01/28/2024 FINAL  Final   Organism ID, Bacteria ESCHERICHIA COLI (A)  Final      Susceptibility   Escherichia coli - MIC*  AMPICILLIN 4 SENSITIVE Sensitive     CEFAZOLIN <=4 SENSITIVE Sensitive     CEFEPIME <=0.12 SENSITIVE Sensitive     CEFTRIAXONE <=0.25 SENSITIVE Sensitive     CIPROFLOXACIN <=0.25 SENSITIVE Sensitive     GENTAMICIN <=1 SENSITIVE Sensitive     IMIPENEM <=0.25 SENSITIVE Sensitive     NITROFURANTOIN <=16 SENSITIVE Sensitive     TRIMETH/SULFA <=20 SENSITIVE Sensitive     AMPICILLIN/SULBACTAM <=2 SENSITIVE Sensitive     PIP/TAZO <=4 SENSITIVE Sensitive ug/mL    * >=100,000 COLONIES/mL ESCHERICHIA COLI    Coagulation Studies: No results for input(s): "LABPROT", "INR" in the last 72 hours.  Urinalysis: No results for input(s): "COLORURINE", "LABSPEC", "PHURINE",  "GLUCOSEU", "HGBUR", "BILIRUBINUR", "KETONESUR", "PROTEINUR", "UROBILINOGEN", "NITRITE", "LEUKOCYTESUR" in the last 72 hours.  Invalid input(s): "APPERANCEUR"     Imaging: No results found.   Medications:    sodium chloride 50 mL/hr at 02/01/24 0133    allopurinol  100 mg Oral Daily   apixaban  5 mg Oral BID   atorvastatin  40 mg Oral Daily   Chlorhexidine Gluconate Cloth  6 each Topical Daily   feeding supplement (NEPRO CARB STEADY)  237 mL Oral BID BM   ferrous sulfate  325 mg Oral BID WC   loratadine  10 mg Oral Daily   losartan  12.5 mg Oral Daily   metoprolol succinate  25 mg Oral Daily   multivitamin with minerals  1 tablet Oral Daily   oxybutynin  10 mg Oral Daily   pantoprazole  40 mg Oral BID   tamsulosin  0.4 mg Oral Daily   acetaminophen **OR** acetaminophen, albuterol, calcium carbonate, labetalol, ondansetron **OR** ondansetron (ZOFRAN) IV, mouth rinse, traZODone  Assessment/ Plan:  Mr. Sean [name]. is a 83 y.o.  male with past medical history of CHF, diabetes, CAD s/p CABG, hypertension and chornic kidney disease stage IV.  Patient presents to the [facility] department with altered mental status and weakness and has been admitted for Acute cystitis with hematuria [N30.01] AKI (acute kidney injury) (HCC) [N17.9] Altered mental status, unspecified altered mental status type [R41.82] Acute kidney injury superimposed on chronic kidney disease (HCC) [N17.9, N18.9]   Acute Kidney Injury on chronic kidney disease stage 4 with baseline creatinine 2.8 and GFR of 22 on 01/10/2024.  Acute kidney injury secondary to poor oral intake with concurrent infection.  No IV contrast exposure. UA positive for UTI with culture indicating E. coli.   Renal ultrasound shows simple cyst without obstruction.     Creatinine greatly improved today with decent urine output.  No acute indication for dialysis.  Continue IVF, 0.9% normal saline at 39ml/hr. may discontinue this evening. Serum  creatinine is getting close to baseline.  Cleared from discharge from renal standpoint.  We will arrange for hospital follow-up in near future.  Lab Results  Component Value Date   CREATININE 3.15 (H) 02/01/2024   CREATININE 3.61 (H) 01/31/2024   CREATININE 3.90 (H) 01/30/2024    Intake/Output Summary (Last 24 hours) at 02/01/2024 1304 Last data filed at 02/01/2024 1001 Gross per 24 hour  Intake 1460.23 ml  Output 3000 ml  Net -1539.77 ml   2.  Hyponatremia likely secondary to kidney injury.    Sodium 136 today  3. Anemia of chronic kidney disease Lab Results  Component Value Date   HGB 10.5 (L) 02/01/2024  Hemoglobin within acceptable range.  4.  Hypertension with chronic kidney disease.  Home regimen includes losartan, metoprolol, and spironolactone.  Patient also  prescribed tamsulosin.  Restarted losartan today.  We can consider restarting spironolactone as outpatient.      LOS: 7 Sean [name] 3/14/20251:04 PM

## 2024-02-02 DIAGNOSIS — N184 Chronic kidney disease, stage 4 (severe): Secondary | ICD-10-CM | POA: Diagnosis not present

## 2024-02-02 DIAGNOSIS — N178 Other acute kidney failure: Secondary | ICD-10-CM | POA: Diagnosis not present

## 2024-02-02 LAB — CBC
HCT: 30.9 % — ABNORMAL LOW (ref 39.0–52.0)
Hemoglobin: 10.4 g/dL — ABNORMAL LOW (ref 13.0–17.0)
MCH: 32.5 pg (ref 26.0–34.0)
MCHC: 33.7 g/dL (ref 30.0–36.0)
MCV: 96.6 fL (ref 80.0–100.0)
Platelets: 243 10*3/uL (ref 150–400)
RBC: 3.2 MIL/uL — ABNORMAL LOW (ref 4.22–5.81)
RDW: 13.3 % (ref 11.5–15.5)
WBC: 16.9 10*3/uL — ABNORMAL HIGH (ref 4.0–10.5)
nRBC: 0 % (ref 0.0–0.2)

## 2024-02-02 LAB — BASIC METABOLIC PANEL
Anion gap: 6 (ref 5–15)
BUN: 52 mg/dL — ABNORMAL HIGH (ref 8–23)
CO2: 23 mmol/L (ref 22–32)
Calcium: 7.7 mg/dL — ABNORMAL LOW (ref 8.9–10.3)
Chloride: 108 mmol/L (ref 98–111)
Creatinine, Ser: 3.07 mg/dL — ABNORMAL HIGH (ref 0.61–1.24)
GFR, Estimated: 20 mL/min — ABNORMAL LOW (ref >60)
Glucose, Bld: 127 mg/dL — ABNORMAL HIGH (ref 70–99)
Potassium: 4.4 mmol/L (ref 3.5–5.1)
Sodium: 137 mmol/L (ref 135–145)

## 2024-02-02 LAB — MAGNESIUM: Magnesium: 1.8 mg/dL (ref 1.7–2.4)

## 2024-02-02 MED ORDER — NEPRO/CARBSTEADY PO LIQD: 237.0000 mL | Freq: Two times a day (BID) | ORAL | Status: DC

## 2024-02-02 NOTE — Progress Notes (Signed)
 [facility]  ROUNDING NOTE   Subjective:  Sean [name]. Is a 83 y.o. male with past medical history of CHF, diabetes, CAD s/p CABG, hypertension and chornic kidney disease stage IV.  Patient presents to the [facility] department with altered mental status and weakness and has been admitted for Acute cystitis with hematuria [N30.01] AKI (acute kidney injury) (HCC) [N17.9] Altered mental status, unspecified altered mental status type [R41.82] Acute kidney injury superimposed on chronic kidney disease (HCC) [N17.9, N18.9]   Patient is known to our practice from previous admission and is followed by Dr. Thedore Mins [name] at the bedside, patient sitting at the edge of the bed.   Creat 3.07 UOP 2L overnight    Objective:  Vital signs in last 24 hours:  Temp:  [98 F (36.7 C)-98.9 F (37.2 C)] 98 F (36.7 C) (03/15 0854) Pulse Rate:  [68-74] 74 (03/15 0854) Resp:  [15-22] 15 (03/15 0854) BP: (127-153)/(65-83) 134/68 (03/15 0854) SpO2:  [97 %-100 %] 97 % (03/15 0854)  Weight change:  Filed Weights   01/26/24 1130  Weight: 101.6 kg    Intake/Output: I/O last 3 completed shifts: In: 240 [P.O.:240] Out: 5200 [Urine:5200]   Intake/Output this shift:  Total I/O In: 120 [P.O.:120] Out: 1600 [Urine:1600]  Physical Exam: General: NAD,   Head: Normocephalic, atraumatic. Moist oral mucosal membranes  Eyes: Anicteric, PERRL  Neck: Supple, trachea midline  Lungs:  Clear to auscultation  Heart: Regular rate and rhythm  Abdomen:  Soft, nontender,   Extremities:  No peripheral edema.  Neurologic: Nonfocal, moving all four extremities  Skin: No lesions  Access: None    Basic Metabolic Panel: Recent Labs  Lab 01/28/24 0458 01/30/24 0455 01/31/24 0510 02/01/24 0435 02/02/24 0523  NA 135 137 136 136 137  K 4.5 4.3 4.2 4.7 4.4  CL 101 105 104 103 108  CO2 24 22 23 24 23   GLUCOSE 119* 127* 122* 141* 127*  BUN 81* 68* 62* 55* 52*  CREATININE 4.91* 3.90* 3.61* 3.15* 3.07*   CALCIUM 8.5* 8.0* 7.9* 8.0* 7.7*  MG  --   --  1.8 1.7 1.8    Liver Function Tests: Recent Labs  Lab 01/27/24 0629  AST 23  ALT 18  ALKPHOS 49  BILITOT 0.7  PROT 5.3*  ALBUMIN 2.5*   No results for input(s): "LIPASE", "AMYLASE" in the last 168 hours. No results for input(s): "AMMONIA" in the last 168 hours.  CBC: Recent Labs  Lab 01/27/24 0629 01/31/24 0510 02/01/24 0435 02/02/24 0523  WBC 21.4* 14.5* 16.4* 16.9*  HGB 9.3* 10.4* 10.5* 10.4*  HCT 27.3* 31.1* 31.5* 30.9*  MCV 96.8 96.3 96.3 96.6  PLT 133* 194 224 243    Cardiac Enzymes: No results for input(s): "CKTOTAL", "CKMB", "CKMBINDEX", "TROPONINI" in the last 168 hours.  BNP: Invalid input(s): "POCBNP"  CBG: No results for input(s): "GLUCAP" in the last 168 hours.  Microbiology: Results for orders placed or performed during the hospital encounter of 01/25/24  Resp panel by RT-PCR (RSV, Flu A&B, Covid) Anterior Nasal Swab     Status: None   Collection Time: 01/25/24  3:16 PM   Specimen: Anterior Nasal Swab  Result Value Ref Range Status   SARS Coronavirus 2 by RT PCR NEGATIVE NEGATIVE Final    Comment: (NOTE) SARS-CoV-2 target nucleic acids are NOT DETECTED.  The SARS-CoV-2 RNA is generally detectable in upper respiratory specimens during the acute phase of infection. The lowest concentration of SARS-CoV-2 viral copies this assay  can detect is 138 copies/mL. A negative result does not preclude SARS-Cov-2 infection and should not be used as the sole basis for treatment or other patient management decisions. A negative result may occur with  improper specimen collection/handling, submission of specimen other than nasopharyngeal swab, presence of viral mutation(s) within the areas targeted by this assay, and inadequate number of viral copies(<138 copies/mL). A negative result must be combined with clinical observations, patient history, and epidemiological information. The expected result is  Negative.  Fact Sheet for Patients:  BloggerCourse.com  Fact Sheet for Healthcare Providers:  SeriousBroker.it  This test is no t yet approved or cleared by the [place] FDA and  has been authorized for detection and/or diagnosis of SARS-CoV-2 by FDA under an Emergency Use Authorization (EUA). This EUA will remain  in effect (meaning this test can be used) for the duration of the COVID-19 declaration under Section 564(b)(1) of the Act, 21 U.S.C.section 360bbb-3(b)(1), unless the authorization is terminated  or revoked sooner.       Influenza A by PCR NEGATIVE NEGATIVE Final   Influenza B by PCR NEGATIVE NEGATIVE Final    Comment: (NOTE) The Xpert Xpress SARS-CoV-2/FLU/RSV plus assay is intended as an aid in the diagnosis of influenza from Nasopharyngeal swab specimens and should not be used as a sole basis for treatment. Nasal washings and aspirates are unacceptable for Xpert Xpress SARS-CoV-2/FLU/RSV testing.  Fact Sheet for Patients: BloggerCourse.com  Fact Sheet for Healthcare Providers: SeriousBroker.it  This test is not yet approved or cleared by the [place] FDA and has been authorized for detection and/or diagnosis of SARS-CoV-2 by FDA under an Emergency Use Authorization (EUA). This EUA will remain in effect (meaning this test can be used) for the duration of the COVID-19 declaration under Section 564(b)(1) of the Act, 21 U.S.C. section 360bbb-3(b)(1), unless the authorization is terminated or revoked.     Resp Syncytial Virus by PCR NEGATIVE NEGATIVE Final    Comment: (NOTE) Fact Sheet for Patients: BloggerCourse.com  Fact Sheet for Healthcare Providers: SeriousBroker.it  This test is not yet approved or cleared by the [place] FDA and has been authorized for detection and/or diagnosis of  SARS-CoV-2 by FDA under an Emergency Use Authorization (EUA). This EUA will remain in effect (meaning this test can be used) for the duration of the COVID-19 declaration under Section 564(b)(1) of the Act, 21 U.S.C. section 360bbb-3(b)(1), unless the authorization is terminated or revoked.  Performed at [facility], [address], [place], [place] [address]   Urine Culture     Status: Abnormal   Collection Time: 01/25/24  5:42 PM   Specimen: Urine, Random  Result Value Ref Range Status   Specimen Description   Final    URINE, RANDOM Performed at [facility], [address] [place], [place] [address]    Special Requests   Final    NONE Reflexed from [id number] Performed at [facility], [address] [place], [place] [address]    Culture >=100,000 COLONIES/mL ESCHERICHIA COLI (A)  Final   Report Status 01/28/2024 FINAL  Final   Organism ID, Bacteria ESCHERICHIA COLI (A)  Final      Susceptibility   Escherichia coli - MIC*    AMPICILLIN 4 SENSITIVE Sensitive     CEFAZOLIN <=4 SENSITIVE Sensitive     CEFEPIME <=0.12 SENSITIVE Sensitive     CEFTRIAXONE <=0.25 SENSITIVE Sensitive     CIPROFLOXACIN <=0.25 SENSITIVE Sensitive     GENTAMICIN <=1 SENSITIVE Sensitive  IMIPENEM <=0.25 SENSITIVE Sensitive     NITROFURANTOIN <=16 SENSITIVE Sensitive     TRIMETH/SULFA <=20 SENSITIVE Sensitive     AMPICILLIN/SULBACTAM <=2 SENSITIVE Sensitive     PIP/TAZO <=4 SENSITIVE Sensitive ug/mL    * >=100,000 COLONIES/mL ESCHERICHIA COLI    Coagulation Studies: No results for input(s): "LABPROT", "INR" in the last 72 hours.  Urinalysis: No results for input(s): "COLORURINE", "LABSPEC", "PHURINE", "GLUCOSEU", "HGBUR", "BILIRUBINUR", "KETONESUR", "PROTEINUR", "UROBILINOGEN", "NITRITE", "LEUKOCYTESUR" in the last 72 hours.  Invalid input(s): "APPERANCEUR"    Imaging: No results found.   Medications:     allopurinol  100 mg Oral Daily   apixaban  5 mg  Oral BID   atorvastatin  40 mg Oral Daily   Chlorhexidine Gluconate Cloth  6 each Topical Daily   feeding supplement (NEPRO CARB STEADY)  237 mL Oral BID BM   ferrous sulfate  325 mg Oral BID WC   loratadine  10 mg Oral Daily   losartan  12.5 mg Oral Daily   metoprolol succinate  25 mg Oral Daily   multivitamin with minerals  1 tablet Oral Daily   oxybutynin  10 mg Oral Daily   pantoprazole  40 mg Oral BID   tamsulosin  0.4 mg Oral Daily   acetaminophen **OR** acetaminophen, albuterol, calcium carbonate, labetalol, ondansetron **OR** ondansetron (ZOFRAN) IV, mouth rinse, traZODone  Assessment/ Plan:  Mr. Deon Duer. is a 83 y.o.  male with past medical history of CHF, diabetes, CAD s/p CABG, hypertension and chornic kidney disease stage IV.  Patient presents to the [facility] department with altered mental status and weakness and has been admitted for Acute cystitis with hematuria [N30.01] AKI (acute kidney injury) (HCC) [N17.9] Altered mental status, unspecified altered mental status type [R41.82] Acute kidney injury superimposed on chronic kidney disease (HCC) [N17.9, N18.9]     Acute Kidney Injury on chronic kidney disease stage 4 with baseline creatinine 2.8 and GFR of 22 on 01/10/2024.  Acute kidney injury secondary to poor oral intake with concurrent infection.  No IV contrast exposure. UA positive for UTI with culture indicating E. coli.   Renal ultrasound shows simple cyst without obstruction.                Creatinine greatly improved today with decent urine output.  No acute indication for dialysis.   Serum creatinine is getting close to baseline.  Cleared from discharge from renal standpoint. Patient to follow-up with Nephrology outpatient office, [facility] office   Lab Results  Component Value Date   CREATININE 3.07 (H) 02/02/2024   CREATININE 3.15 (H) 02/01/2024   CREATININE 3.61 (H) 01/31/2024       Intake/Output Summary (Last 24 hours) at  02/02/2024 1708 Last data filed at 02/02/2024 1500 Gross per 24 hour  Intake 120 ml  Output 3600 ml  Net -3480 ml    2.  Hyponatremia likely secondary to kidney injury.               Sodium 137 today   3. Anemia of chronic kidney disease Recent Labs       Lab Results  Component Value Date    HGB 10.4 (L) 02/02/2024    Hemoglobin within acceptable range.   4.  Hypertension with chronic kidney disease.  Home regimen includes losartan, metoprolol, and spironolactone.  Patient also prescribed tamsulosin.  Restarted losartan yesterday.  We can consider restarting spironolactone as outpatient. BP 134/68   LOS: 8 Monna Crean Tonny Bollman 3/15/20255:04  PM

## 2024-02-02 NOTE — Discharge Summary (Signed)
 Physician Discharge Summary   Sean [name]  male DOB: February 09, 1941  ZOX:[id number]  PCP: Jerl Mina, MD  Admit date: 01/25/2024 Discharge date: 02/02/2024  Admitted From: home Disposition:  home Home Health: Yes CODE STATUS: DNR  Discharge Instructions     Ambulatory referral to Urology   Complete by: As directed       Hospital Course:  For full details, please see H&P, progress notes, consult notes and ancillary notes.  Briefly,  Sean [name]. is a 83 y.o. Caucasian male with medical history significant for asthma, coronary artery disease, chronic systolic CHF, stage III CKD, essential hypertension and obesity, who presented to the emergency room with acute onset of generalized weakness altered mental status.  He had temperature 100.3, severe leukocytosis 32.8, lactic acid 2.2, she also has worsening renal function.  Patient also has abnormal urine, diagnosed with urinary tract infection.  Placed on IV fluids and antibiotics.   Acute renal failure superimposed on stage 4 chronic kidney disease (HCC) --Cr 4.76 on presentation.  Renal function much worse due to severe sepsis.  Pt has a chronic Foley catheter since July last year.  Initial CT scan showed distended bladder, however, bladder scan performed 3/9 showed a residual of 0.  He has been treated with normal saline, renal function finally improved on 3/12.  Cr 3.07 prior to discharge.  Nephro consulted and cleared pt for discharge.   Severe sepsis secondary to urinary tract infection. Acute metabolic encephalopathy secondary to urinary tract infection. Patient had fever, severe leukocytosis, lactic acidosis and worsening renal function.  Condition consistent with severe sepsis. It appears that blood culture was sent out at admission, urine culture grew E. coli. --completed 4 days of ceftriaxone f/b Keflex for a 7-day course.   Acute urinary retention Ruled out chronic Foley BPH --correction, per wife, pt did  not have Foley PTA.  Foley was placed on 3/8 after bladder scan found urinary retention.  There was already outpatient concern for BPH, and pt was started on Flomax about a week PTA. --remove Foley on 3/14 for voiding trial, and pt was found to retain urine, so Foley re-inserted.  Pt was discharged home with Foley and will need to follow up with outpatient urology. --pt not taking oxybutynin PTA --cont home Proscar and Flomax  Essential hypertension --cont home Toprol --hold home spironolactone pending outpatient f/u due to AKI   Hyponatremia --likely due to dehydration.  resolved with MIVF   Dyslipidemia - ontinue statin therapy.  Gout - continue allopurinol.   Paroxysmal atrial flutter (HCC) --cont Toprol --cont Eliquis   History of pulmonary embolus (PE) --cont Eliquis   Coronary artery disease. --cont statin   Chronic combined systolic diastolic congestive heart failure. Conditions are stable. --cont Toprol    Discharge Diagnoses:  Principal Problem:   Acute renal failure superimposed on stage 4 chronic kidney disease (HCC) Active Problems:   Acute lower UTI   Acute encephalopathy   Essential hypertension   Leukocytosis   Dyslipidemia   BPH (benign prostatic hyperplasia)   Gout   Coronary artery disease   Atrial flutter (HCC)   History of pulmonary embolus (PE)   Paroxysmal atrial flutter (HCC)   Severe sepsis (HCC)   Class 1 obesity   30 Day Unplanned Readmission Risk Score    Flowsheet Row ED to Hosp-Admission (Current) from 01/25/2024 in [facility]  30 Day Unplanned Readmission Risk Score (%) 20.18 Filed at 02/02/2024 0800  This score is the patient's risk of an unplanned readmission within 30 days of being discharged (0 -100%). The score is based on dignosis, age, lab data, medications, orders, and past utilization.   Low:  0-14.9   Medium: 15-21.9   High: 22-29.9   Extreme: 30 and above          Discharge Instructions:  Allergies as of 02/02/2024       Reactions   Azithromycin Hives   Erythromycin Hives        Medication List     PAUSE taking these medications    spironolactone 25 MG tablet Wait to take this until your doctor or other care provider tells you to start again. Due to acute kidney injury. Commonly known as: ALDACTONE Take 12.5 mg by mouth daily.       TAKE these medications    acetaminophen 500 MG tablet Commonly known as: TYLENOL Take 500 mg by mouth every 6 (six) hours as needed for moderate pain or headache.   allopurinol 100 MG tablet Commonly known as: ZYLOPRIM Take 100 mg by mouth daily. At night   apixaban 5 MG Tabs tablet Commonly known as: ELIQUIS Take 1 tablet (5 mg total) by mouth 2 (two) times daily.   atorvastatin 40 MG tablet Commonly known as: LIPITOR TAKE ONE TABLET EVERY DAY   feeding supplement (NEPRO CARB STEADY) Liqd Take 237 mLs by mouth 2 (two) times daily between meals.   ferrous sulfate 325 (65 FE) MG EC tablet Take 325 mg by mouth 2 (two) times daily.   finasteride 5 MG tablet Commonly known as: PROSCAR Take 5 mg by mouth daily.   loratadine 10 MG tablet Commonly known as: Claritin Take 1 tablet (10 mg total) by mouth daily.   losartan 25 MG tablet Commonly known as: COZAAR Take 12.5 mg by mouth daily.   metoprolol succinate 25 MG 24 hr tablet Commonly known as: TOPROL-XL TAKE 1 TABLET BY MOUTH DAILY   multivitamin with minerals Tabs tablet Take 1 tablet by mouth daily.   oxybutynin 10 MG 24 hr tablet Commonly known as: DITROPAN-XL Take 10 mg by mouth daily. What changed: Another medication with the same name was removed. Continue taking this medication, and follow the directions you see here.   Primatene Mist 0.125 MG/ACT Aero Generic drug: EPINEPHrine Inhale 1 puff into the lungs daily as needed (shortness of breath).   PROSTATE PO Take 1 tablet by mouth at bedtime.   tamsulosin 0.4  MG Caps capsule Commonly known as: FLOMAX Take 0.4 mg by mouth daily.         Follow-up Information     Jerl Mina, MD Follow up in 1 week(s).   Specialty: Family Medicine Contact information: [address] [facility] [place] [address] [phone]         Riki Altes, MD Follow up in 1 month(s).   Specialty: Urology Why: acute urinary retention discharged home with Foley Contact information: [address] [place] [address] [phone]                 Allergies  Allergen Reactions   Azithromycin Hives   Erythromycin Hives     The results of significant diagnostics from this hospitalization (including imaging, microbiology, ancillary and laboratory) are listed below for reference.   Consultations:   Procedures/Studies: US RENAL Result Date: 01/26/2024 CLINICAL DATA:  Acute kidney injury. EXAM: RENAL / URINARY TRACT ULTRASOUND COMPLETE COMPARISON:  Renal ultrasound  06/04/2022. FINDINGS: Right Kidney: Renal measurements: 11.9 x 6.6 x 5.9 cm = volume: 243.4 mL. No perinephric fluid. Moderate intrarenal collecting system dilatation particularly towards the upper pole. Left Kidney: Renal measurements: 12.1 x 6.0 x 7.5 cm = volume: 282.5 mL. No perinephric fluid. There is moderate left collecting system dilatation as well. There are some left-sided cysts identified including central and peripheral separate areas. The peripheral area for example measures 12 mm. Larger central area measures 3.0 x 4.4 x 3.5 cm. These were seen on the prior examination. Bladder: Distended urinary bladder. Slight wall thickening and trabeculation. Other: None. IMPRESSION: Moderate renal collecting system dilatation. Separate renal cysts. There is also a distended urinary bladder with wall thickening and trabeculation. Recommend follow up imaging after emptying the bladder to see if the collecting system dilatation persists. Otherwise additional  cross-sectional imaging study as clinically appropriate. Electronically Signed   By: Karen Kays M.D.   On: 01/26/2024 12:04   US Venous Img Lower Unilateral Right Result Date: 01/25/2024 CLINICAL DATA:  Right lower extremity swelling, history of DVT status post thrombectomy EXAM: RIGHT LOWER EXTREMITY VENOUS DOPPLER ULTRASOUND TECHNIQUE: Gray-scale sonography with compression, as well as color and duplex ultrasound, were performed to evaluate the deep venous system(s) from the level of the common femoral vein through the popliteal and proximal calf veins. COMPARISON:  06/12/2023 FINDINGS: VENOUS There has been interval recanalization of the right common femoral vein which demonstrates normal compressibility and antegrade flow. Normal respiratory variation noted. No deep venous reflux noted in this location. Interval recanalization of the terminal greater saphenous vein. Interval resolution nonocclusive thrombus within the visualized central profundus femoral vein. Partial recanalization of the proximal femoral vein. The mid and distal femoral vein appear chronically thrombosed. Interval partial recanalization of the popliteal vein. Posterior tibial and peroneal veins of the right calf are patent. Limited views of the contralateral common femoral vein are unremarkable. OTHER None. Limitations: none IMPRESSION: 1. Interval recanalization of the right common femoral vein, terminal greater saphenous vein, and proximal profundus femoral vein. Partial recanalization of the proximal femoral and popliteal vein. 2. 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 Electronically Signed   By: Helyn Numbers M.D.   On: 01/25/2024 23:24   DG Chest 2 View Result Date: 01/25/2024 CLINICAL DATA:  Cough EXAM: CHEST - 2 VIEW COMPARISON:  06/12/2023 FINDINGS: Lungs are clear. No pneumothorax or pleural effusion. Cardiac size within normal limits. Left subclavian dual lead pacemaker is in place with leads in expected  position. Additional detached epicardial pacer lead noted. Coronary artery bypass grafting has been performed. Cardiac size within normal limits. Pulmonary vascularity is normal. No acute bone abnormality. IMPRESSION: 1. No active cardiopulmonary disease. Electronically Signed   By: Helyn Numbers M.D.   On: 01/25/2024 19:43   CT Head Wo Contrast Result Date: 01/25/2024 CLINICAL DATA:  Stroke, unusual behavior since Wednesday. EXAM: CT HEAD WITHOUT CONTRAST TECHNIQUE: Contiguous axial images were obtained from the base of the skull through the vertex without intravenous contrast. RADIATION DOSE REDUCTION: This exam was performed according to the departmental dose-optimization program which includes automated exposure control, adjustment of the mA and/or kV according to patient size and/or use of iterative reconstruction technique. COMPARISON:  CT head 06/26/2018 FINDINGS: Brain: No acute intracranial hemorrhage. No CT evidence of acute infarct. 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 Nonspecific hypoattenuation in the periventricular and subcortical white matter  favored to reflect chronic microvascular ischemic changes. No edema, mass effect, or midline shift. The basilar cisterns are patent. Ventricles: Prominence of the ventricles suggesting underlying parenchymal volume loss. Vascular: Atherosclerotic calcifications of the carotid siphons and intracranial vertebral arteries. No hyperdense vessel. Skull: No acute or aggressive finding. Orbits: Orbits are symmetric. Sinuses: Mild scattered mucosal thickening in the ethmoid and maxillary sinuses with possible mucous retention cysts in the left maxillary sinus. Other: Mastoid air cells are clear. IMPRESSION: 1. No acute intracranial abnormality. 2. 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 3.  Chronic microvascular ischemic changes and parenchymal volume loss. Electronically Signed   By: Emily Filbert M.D.   On: 01/25/2024 19:28      Labs: BNP (last 3 results) No results for input(s): "BNP" in the last 8760 hours. Basic Metabolic Panel: Recent Labs  Lab 01/28/24 0458 01/30/24 0455 01/31/24 0510 02/01/24 0435 02/02/24 0523  NA 135 137 136 136 137  K 4.5 4.3 4.2 4.7 4.4  CL 101 105 104 103 108  CO2 24 22 23 24 23   GLUCOSE 119* 127* 122* 141* 127*  BUN 81* 68* 62* 55* 52*  CREATININE 4.91* 3.90* 3.61* 3.15* 3.07*  CALCIUM 8.5* 8.0* 7.9* 8.0* 7.7*  MG  --   --  1.8 1.7 1.8   Liver Function Tests: Recent Labs  Lab 01/27/24 0629  AST 23  ALT 18  ALKPHOS 49  BILITOT 0.7  PROT 5.3*  ALBUMIN 2.5*   No results for input(s): "LIPASE", "AMYLASE" in the last 168 hours. No results for input(s): "AMMONIA" in the last 168 hours. CBC: Recent Labs  Lab 01/27/24 0629 01/31/24 0510 02/01/24 0435 02/02/24 0523  WBC 21.4* 14.5* 16.4* 16.9*  HGB 9.3* 10.4* 10.5* 10.4*  HCT 27.3* 31.1* 31.5* 30.9*  MCV 96.8 96.3 96.3 96.6  PLT 133* 194 224 243   Cardiac Enzymes: No results for input(s): "CKTOTAL", "CKMB", "CKMBINDEX", "TROPONINI" in the last 168 hours. BNP: Invalid input(s): "POCBNP" CBG: No results for input(s): "GLUCAP" in the last 168 hours. D-Dimer No results for input(s): "DDIMER" in the last 72 hours. Hgb A1c No results for input(s): "HGBA1C" in the last 72 hours. Lipid Profile No results for input(s): "CHOL", "HDL", "LDLCALC", "TRIG", "CHOLHDL", "LDLDIRECT" in the last 72 hours. Thyroid function studies No results for input(s): "TSH", "T4TOTAL", "T3FREE", "THYROIDAB" in the last 72 hours.  Invalid input(s): "FREET3" Anemia work up No results for input(s): "VITAMINB12", "FOLATE", "FERRITIN", "TIBC", "IRON", "RETICCTPCT" in the last 72 hours. Urinalysis    Component Value Date/Time   COLORURINE YELLOW (A) 01/25/2024 1742   APPEARANCEUR CLOUDY (A)  01/25/2024 1742   APPEARANCEUR Clear 08/13/2014 2148   LABSPEC 1.008 01/25/2024 1742   LABSPEC 1.013 08/13/2014 2148   PHURINE 5.0 01/25/2024 1742   GLUCOSEU NEGATIVE 01/25/2024 1742   GLUCOSEU Negative 08/13/2014 2148   HGBUR MODERATE (A) 01/25/2024 1742   BILIRUBINUR NEGATIVE 01/25/2024 1742   BILIRUBINUR Negative 08/13/2014 2148   KETONESUR NEGATIVE 01/25/2024 1742   PROTEINUR NEGATIVE 01/25/2024 1742   NITRITE NEGATIVE 01/25/2024 1742   LEUKOCYTESUR LARGE (A) 01/25/2024 1742   LEUKOCYTESUR Negative 08/13/2014 2148   Sepsis Labs Recent Labs  Lab 01/27/24 0629 01/31/24 0510 02/01/24 0435 02/02/24 0523  WBC 21.4* 14.5* 16.4* 16.9*   Microbiology Recent Results (from the past 240 hours)  Resp panel by RT-PCR (RSV, Flu A&B, Covid) Anterior Nasal Swab     Status: None  Collection Time: 01/25/24  3:16 PM   Specimen: Anterior Nasal Swab  Result Value Ref Range Status   SARS Coronavirus 2 by RT PCR NEGATIVE NEGATIVE Final    Comment: (NOTE) SARS-CoV-2 target nucleic acids are NOT DETECTED.  The SARS-CoV-2 RNA is generally detectable in upper respiratory specimens during the acute phase of infection. The lowest concentration of SARS-CoV-2 viral copies this assay can detect is 138 copies/mL. A negative result does not preclude SARS-Cov-2 infection and should not be used as the sole basis for treatment or other patient management decisions. A negative result may occur with  improper specimen collection/handling, submission of specimen other than nasopharyngeal swab, presence of viral mutation(s) within the areas targeted by this assay, and inadequate number of viral copies(<138 copies/mL). A negative result must be combined with clinical observations, patient history, and epidemiological information. The expected result is Negative.  Fact Sheet for Patients:  BloggerCourse.com  Fact Sheet for Healthcare Providers:   SeriousBroker.it  This test is no t yet approved or cleared by the [place] FDA and  has been authorized for detection and/or diagnosis of SARS-CoV-2 by FDA under an Emergency Use Authorization (EUA). This EUA will remain  in effect (meaning this test can be used) for the duration of the COVID-19 declaration under Section 564(b)(1) of the Act, 21 U.S.C.section 360bbb-3(b)(1), unless the authorization is terminated  or revoked sooner.       Influenza A by PCR NEGATIVE NEGATIVE Final   Influenza B by PCR NEGATIVE NEGATIVE Final    Comment: (NOTE) The Xpert Xpress SARS-CoV-2/FLU/RSV plus assay is intended as an aid in the diagnosis of influenza from Nasopharyngeal swab specimens and should not be used as a sole basis for treatment. Nasal washings and aspirates are unacceptable for Xpert Xpress SARS-CoV-2/FLU/RSV testing.  Fact Sheet for Patients: BloggerCourse.com  Fact Sheet for Healthcare Providers: SeriousBroker.it  This test is not yet approved or cleared by the [place] FDA and has been authorized for detection and/or diagnosis of SARS-CoV-2 by FDA under an Emergency Use Authorization (EUA). This EUA will remain in effect (meaning this test can be used) for the duration of the COVID-19 declaration under Section 564(b)(1) of the Act, 21 U.S.C. section 360bbb-3(b)(1), unless the authorization is terminated or revoked.     Resp Syncytial Virus by PCR NEGATIVE NEGATIVE Final    Comment: (NOTE) Fact Sheet for Patients: BloggerCourse.com  Fact Sheet for Healthcare Providers: SeriousBroker.it  This test is not yet approved or cleared by the [place] FDA and has been authorized for detection and/or diagnosis of SARS-CoV-2 by FDA under an Emergency Use Authorization (EUA). This EUA will remain in effect (meaning this test can be used) for  the duration of the COVID-19 declaration under Section 564(b)(1) of the Act, 21 U.S.C. section 360bbb-3(b)(1), unless the authorization is terminated or revoked.  Performed at [facility], [address] [place], [place] [address]   Urine Culture     Status: Abnormal   Collection Time: 01/25/24  5:42 PM   Specimen: Urine, Random  Result Value Ref Range Status   Specimen Description   Final    URINE, RANDOM Performed at [facility], [address] [place], [place] [address]    Special Requests   Final    NONE Reflexed from [id number] Performed at [facility], [address] [place], [place] [address]    Culture >=100,000 COLONIES/mL ESCHERICHIA COLI (A)  Final   Report Status 01/28/2024 FINAL  Final  Organism ID, Bacteria ESCHERICHIA COLI (A)  Final      Susceptibility   Escherichia coli - MIC*    AMPICILLIN 4 SENSITIVE Sensitive     CEFAZOLIN <=4 SENSITIVE Sensitive     CEFEPIME <=0.12 SENSITIVE Sensitive     CEFTRIAXONE <=0.25 SENSITIVE Sensitive     CIPROFLOXACIN <=0.25 SENSITIVE Sensitive     GENTAMICIN <=1 SENSITIVE Sensitive     IMIPENEM <=0.25 SENSITIVE Sensitive     NITROFURANTOIN <=16 SENSITIVE Sensitive     TRIMETH/SULFA <=20 SENSITIVE Sensitive     AMPICILLIN/SULBACTAM <=2 SENSITIVE Sensitive     PIP/TAZO <=4 SENSITIVE Sensitive ug/mL    * >=100,000 COLONIES/mL ESCHERICHIA COLI     Total time spend on discharging this patient, including the last patient exam, discussing the hospital stay, instructions for ongoing care as it relates to all pertinent caregivers, as well as preparing the medical discharge records, prescriptions, and/or referrals as applicable, is 35 minutes.    Darlin Priestly, MD  [facility] 02/02/2024, 9:09 AM

## 2024-02-02 NOTE — Progress Notes (Signed)
       CROSS COVER NOTE  NAME: Sean [name]. MRN: [id number] DOB : 09/08/1941 ATTENDING PHYSICIAN: Darlin Priestly, MD    Date of Service   02/02/2024   HPI/Events of Note     Interventions   Assessment/Plan: Acute urinary retention - over 2 L  Foley placed        [name] NP [facility] Cross Cover 7pm-7am - check amion for availability Pager [phone]

## 2024-02-02 NOTE — TOC Transition Note (Signed)
 Transition of Care [facility]) - Discharge Note   Patient Details  Name: Sean [name]. MRN: [id number] Date of Birth: September 02, 1941  [facility] CM/SW Contact:  Liliana Cline, LCSW Phone Number: 02/02/2024, 9:19 AM   Clinical Narrative:    Patient has orders to DC home today. RW ordered through [facility] with [employer].  Notified Shelia with [facility].   Final next level of care: Home w Home Health Services Barriers to Discharge: Barriers Resolved   Patient Goals and CMS Choice            Discharge Placement                       Discharge Plan and Services Additional resources added to the After Visit Summary for                  DME Arranged: Walker rolling DME Agency: [employer] Date DME Agency Contacted: 02/02/24   Representative spoke with at DME Agency: Christella Scheuermann HH Arranged: PT, OT HH Agency: [facility] Date [facility] Agency Contacted: 02/02/24   Representative spoke with at [facility] Agency: Silvio Pate  Social Drivers of Health (SDOH) Interventions SDOH Screenings   Food Insecurity: No Food Insecurity (01/25/2024)  Housing: Low Risk  (01/25/2024)  Transportation Needs: No Transportation Needs (01/25/2024)  Utilities: Not At Risk (01/25/2024)  Depression (PHQ2-9): Low Risk  (09/13/2019)  Financial Resource Strain: Low Risk  (11/09/2022)   Received from [facility], [facility]  Social Connections: Socially Integrated (01/26/2024)  Tobacco Use: Low Risk  (01/25/2024)     Readmission Risk Interventions     No data to display

## 2024-02-06 ENCOUNTER — Telehealth: Payer: Self-pay | Admitting: Internal Medicine

## 2024-02-06 NOTE — Telephone Encounter (Signed)
 The pt wife states he just got out the hospital on 3/17 and that's why he did not transmit. She asked can he do his transmission next week instead. I rescheduled the patient for 02/13/2024 on pt wife request.   I let her know the monitor do say disconnected on our end. She agreed to have her daughter help her send a transmission with his monitor tomorrow to make sure the monitor is working.

## 2024-02-06 NOTE — Telephone Encounter (Signed)
  1. Has your device fired? no  2. Is you device beeping? no  3. Are you experiencing draining or swelling at device site? no  4. Are you calling to see if we received your device transmission? Yes because he keeps getting messages that it is unplug  5. Have you passed out? no   Please route to Device Clinic Pool

## 2024-02-07 ENCOUNTER — Ambulatory Visit (INDEPENDENT_AMBULATORY_CARE_PROVIDER_SITE_OTHER)

## 2024-02-07 DIAGNOSIS — I442 Atrioventricular block, complete: Secondary | ICD-10-CM | POA: Diagnosis not present

## 2024-02-07 LAB — CUP PACEART REMOTE DEVICE CHECK
Battery Remaining Longevity: 9 mo
Battery Voltage: 2.81 V
Brady Statistic AP VP Percent: 66.67 %
Brady Statistic AP VS Percent: 0.07 %
Brady Statistic AS VP Percent: 33.11 %
Brady Statistic AS VS Percent: 0.14 %
Brady Statistic RA Percent Paced: 62.16 %
Brady Statistic RV Percent Paced: 99.81 %
Date Time Interrogation Session: 20250320000841
Implantable Lead Connection Status: 753985
Implantable Lead Connection Status: 753985
Implantable Lead Implant Date: 20180723
Implantable Lead Implant Date: 20180723
Implantable Lead Location: 753859
Implantable Lead Location: 753860
Implantable Lead Model: 3830
Implantable Lead Model: 5076
Implantable Pulse Generator Implant Date: 20180723
Lead Channel Impedance Value: 304 Ohm
Lead Channel Impedance Value: 323 Ohm
Lead Channel Impedance Value: 342 Ohm
Lead Channel Impedance Value: 456 Ohm
Lead Channel Pacing Threshold Amplitude: 0.75 V
Lead Channel Pacing Threshold Amplitude: 1.25 V
Lead Channel Pacing Threshold Pulse Width: 0.4 ms
Lead Channel Pacing Threshold Pulse Width: 0.4 ms
Lead Channel Sensing Intrinsic Amplitude: 0.875 mV
Lead Channel Sensing Intrinsic Amplitude: 0.875 mV
Lead Channel Sensing Intrinsic Amplitude: 7.125 mV
Lead Channel Sensing Intrinsic Amplitude: 7.125 mV
Lead Channel Setting Pacing Amplitude: 2 V
Lead Channel Setting Pacing Amplitude: 2.5 V
Lead Channel Setting Pacing Pulse Width: 0.8 ms
Lead Channel Setting Sensing Sensitivity: 2.8 mV
Zone Setting Status: 755011
Zone Setting Status: 755011

## 2024-02-08 DIAGNOSIS — D631 Anemia in chronic kidney disease: Secondary | ICD-10-CM | POA: Diagnosis not present

## 2024-02-08 DIAGNOSIS — N179 Acute kidney failure, unspecified: Secondary | ICD-10-CM | POA: Diagnosis not present

## 2024-02-08 DIAGNOSIS — I130 Hypertensive heart and chronic kidney disease with heart failure and stage 1 through stage 4 chronic kidney disease, or unspecified chronic kidney disease: Secondary | ICD-10-CM | POA: Diagnosis not present

## 2024-02-08 DIAGNOSIS — B9620 Unspecified Escherichia coli [E. coli] as the cause of diseases classified elsewhere: Secondary | ICD-10-CM | POA: Diagnosis not present

## 2024-02-08 DIAGNOSIS — N3001 Acute cystitis with hematuria: Secondary | ICD-10-CM | POA: Diagnosis not present

## 2024-02-08 DIAGNOSIS — E1122 Type 2 diabetes mellitus with diabetic chronic kidney disease: Secondary | ICD-10-CM | POA: Diagnosis not present

## 2024-02-08 DIAGNOSIS — N184 Chronic kidney disease, stage 4 (severe): Secondary | ICD-10-CM | POA: Diagnosis not present

## 2024-02-08 DIAGNOSIS — I509 Heart failure, unspecified: Secondary | ICD-10-CM | POA: Diagnosis not present

## 2024-02-08 DIAGNOSIS — I2510 Atherosclerotic heart disease of native coronary artery without angina pectoris: Secondary | ICD-10-CM | POA: Diagnosis not present

## 2024-02-08 DIAGNOSIS — E871 Hypo-osmolality and hyponatremia: Secondary | ICD-10-CM | POA: Diagnosis not present

## 2024-02-11 DIAGNOSIS — Z09 Encounter for follow-up examination after completed treatment for conditions other than malignant neoplasm: Secondary | ICD-10-CM | POA: Diagnosis not present

## 2024-02-11 DIAGNOSIS — R4182 Altered mental status, unspecified: Secondary | ICD-10-CM | POA: Diagnosis not present

## 2024-02-11 DIAGNOSIS — R1013 Epigastric pain: Secondary | ICD-10-CM | POA: Diagnosis not present

## 2024-02-11 DIAGNOSIS — Z87440 Personal history of urinary (tract) infections: Secondary | ICD-10-CM | POA: Diagnosis not present

## 2024-02-11 DIAGNOSIS — N1832 Chronic kidney disease, stage 3b: Secondary | ICD-10-CM | POA: Diagnosis not present

## 2024-02-11 DIAGNOSIS — Z978 Presence of other specified devices: Secondary | ICD-10-CM | POA: Diagnosis not present

## 2024-02-11 DIAGNOSIS — Z8619 Personal history of other infectious and parasitic diseases: Secondary | ICD-10-CM | POA: Diagnosis not present

## 2024-02-11 DIAGNOSIS — Z87438 Personal history of other diseases of male genital organs: Secondary | ICD-10-CM | POA: Diagnosis not present

## 2024-02-13 ENCOUNTER — Encounter

## 2024-02-20 ENCOUNTER — Ambulatory Visit: Admitting: Urology

## 2024-02-20 VITALS — BP 124/78 | HR 80

## 2024-02-20 DIAGNOSIS — R339 Retention of urine, unspecified: Secondary | ICD-10-CM | POA: Diagnosis not present

## 2024-02-20 DIAGNOSIS — E875 Hyperkalemia: Secondary | ICD-10-CM | POA: Diagnosis not present

## 2024-02-20 DIAGNOSIS — Z466 Encounter for fitting and adjustment of urinary device: Secondary | ICD-10-CM | POA: Diagnosis not present

## 2024-02-20 DIAGNOSIS — N1832 Chronic kidney disease, stage 3b: Secondary | ICD-10-CM | POA: Diagnosis not present

## 2024-02-20 LAB — BLADDER SCAN AMB NON-IMAGING: Scan Result: 502

## 2024-02-20 MED ORDER — CEPHALEXIN 250 MG PO CAPS
500.0000 mg | ORAL_CAPSULE | Freq: Once | ORAL | Status: AC
  Administered 2024-02-20: 500 mg via ORAL

## 2024-02-20 NOTE — Progress Notes (Signed)
 Catheter Removal  Patient is present today for a catheter removal. 7ml of water was drained from the balloon. A 16FR foley cath was removed from the bladder, no complications were noted. Patient tolerated well. Patient given one 500mg  Keflex per verbal order from Dr. Richardo Hanks.   Performed by: Debbe Bales, CMA (AAMA)  Follow up/ Additional notes: RTC this afternoon as scheduled

## 2024-02-20 NOTE — Patient Instructions (Addendum)
 Your urinary retention and infections are likely caused from incomplete bladder emptying from an enlarged prostate(BPH).  You are on both medications for BPH including Flomax(0.8mg  nightly) and finasteride(5mg  daily), continue those medications.  Do not take oxybutynin again, this prevents your bladder from squeezing and can contribute to retention.  Holmium Laser Enucleation of the Prostate (HoLEP)  HoLEP is a treatment for men with benign prostatic hyperplasia (BPH). The laser surgery removed blockages of urine flow, and is done without any incisions on the body.     What is HoLEP?  HoLEP is a type of laser surgery used to treat obstruction (blockage) of urine flow as a result of benign prostatic hyperplasia (BPH). In men with BPH, the prostate gland is not cancerous, but has become enlarged. An enlarged prostate can result in a number of urinary tract symptoms such as weak urinary stream, difficulty in starting urination, inability to urinate, frequent urination, or getting up at night to urinate.  HoLEP was developed in the 1990's as a more effective and less expensive surgical option for BPH, compared to other surgical options such as laser vaporization(PVP/greenlight laser), transurethral resection of the prostate(TURP), and open simple prostatectomy.   What happens during a HoLEP?  HoLEP requires general anesthesia ("asleep" throughout the procedure).   An antibiotic is given to reduce the risk of infection  A surgical instrument called a resectoscope is inserted through the urethra (the tube that carries urine from the bladder). The resectoscope has a camera that allows the surgeon to view the internal structure of the prostate gland, and to see where the incisions are being made during surgery.  The laser is inserted into the resectoscope and is used to enucleate (free up) the enlarged prostate tissue from the capsule (outer shell) and then to seal up any blood vessels. The tissue  that has been removed is pushed back into the bladder.  A morcellator is placed through the resectoscope, and is used to suction out the prostate tissue that has been pushed into the bladder.  When the prostate tissue has been removed, the resectoscope is removed, and a foley catheter is placed to allow healing and drain the urine from the bladder.     What happens after a HoLEP?  More than 95% of patients go home the same day a few hours after surgery. Less than 5% will be admitted to the hospital overnight for observation to monitor the urine, or if they have other medical problems.  Fluid is flushed through the catheter for about 1 hour after surgery to clear any blood from the urine. It is normal to have some blood in the urine after surgery. The need for blood transfusion is extremely rare.  Eating and drinking are permitted after the procedure once the patient has fully awakened from anesthesia.  The catheter is usually removed 2-3 days after surgery- the patient will come to clinic to have the catheter removed and make sure they can urinate on their own.  It is very important to drink lots of fluids after surgery for one week to keep the bladder flushed.  At first, there may be some burning with urination, but this typically improved within a few hours to days. Most patients do not have a significant amount of pain, and narcotic pain medications are rarely needed.  Symptoms of urinary frequency, urgency, and even leakage are NORMAL for the first few weeks after surgery as the bladder adjusts after having to work hard against blockage from the prostate  for many years. This will improve, but can sometimes take several months.  The use of pelvic floor exercises (Kegel exercises) can help improve problems with urinary incontinence.   After catheter removal, patients will be seen at 12 weeks and 6 months for symptom check  No heavy lifting for at least 2-3 weeks after surgery, however  patients can walk and do light activities the first day after surgery. Return to work time depends on occupation.    What are the advantages of HoLEP?  HoLEP has been studied in many different parts of the world and has been shown to be a safe and effective procedure. Although there are many types of BPH surgeries available, HoLEP offers a unique advantage in being able to remove a large amount of tissue without any incisions on the body, even in very large prostates, while decreasing the risk of bleeding and providing tissue for pathology (to look for cancer). This decreases the need for blood transfusions during surgery, minimizes hospital stay, and reduces the risk of needing repeat treatment.  What are the side effects of HoLEP?  Temporary burning and bleeding during urination. Some blood may be seen in the urine for weeks after surgery and is part of the healing process.  Urinary incontinence (inability to control urine flow) is expected in all patients immediately after surgery and they should wear pads for the first few days/weeks. This typically improves over the course of several weeks. Performing Kegel exercises can help decrease leakage from stress maneuvers such as coughing, sneezing, or lifting. The rate of long term leakage is very low, 1-2%. Patients may also have leakage with urgency and this may be treated with medication. The risk of urge incontinence can be dependent on several factors including age, prostate size, symptoms, and other medical problems.  Retrograde ejaculation or "backwards ejaculation." In 80% of cases, the patient will not see any fluid during ejaculation after surgery.  Erectile function is generally not significantly affected.   What are the risks of HoLEP?  Injury to the urethra or development of scar tissue at a later date  Injury to the capsule of the prostate (typically treated with longer catheterization).  Injury to the bladder or ureteral orifices  (where the urine from the kidney drains out)  Infection of the bladder, testes, or kidneys (~4%)  Return of urinary obstruction at a later date requiring another operation (<2%)  Need for blood transfusion or re-operation due to bleeding  Failure to relieve all symptoms and/or need for prolonged catheterization after surgery  5-15% of patients are found to have previously undiagnosed prostate cancer in their specimen. Prostate cancer can be treated after HoLEP.  Standard risks of anesthesia including blood clots, heart attacks, etc ~1-2% risk of long term urinary incontinence (leakage)  When should I call my doctor?  Fever over 101.3 degrees  Inability to urinate, or large blood clots in the urine

## 2024-02-20 NOTE — Progress Notes (Signed)
 Simple Catheter Placement  Due to urinary retention patient is present today for a foley cath placement.  Patient was cleaned and prepped in a sterile fashion with betadine and 2% lidocaine jelly was instilled into the urethra. A 18 FR coude foley catheter was inserted, urine return was noted  540 ml, urine was yellow in color.  The balloon was filled with 10cc of sterile water.  A night bag was attached for drainage. Patient was also given a night bag to take home and was given instruction on how to change from one bag to another.    Patient tolerated well, no complications were noted   Performed by: Jaycelynn Knickerbocker H RMA  Additional notes/ Follow up: 2 week f/u for voiding trial

## 2024-02-20 NOTE — Progress Notes (Signed)
 02/20/24 8:58 AM   Sean [name]. 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  He was recently admitted to [facility] 3/7 through 02/02/2024 for altered mental status, weakness, UTI(culture grew pansensitive E. Coli), and urinary retention.  He has had a Foley catheter in place since that time.  Reportedly failed a voiding trial during hospitalization.  History is obtained almost entirely from his wife and daughter today.  Unfortunately it sounds like he was started on oxybutynin in mid February which likely contributed to his incomplete emptying and infection.  He has been on Flomax long-term, was started on finasteride 1 to 2 months ago per family.  PSA in February 2025 was 8.16.  Most recent renal function with catheter in place from 02/07/2024 creatinine 3.1, EGFR 19.   PMH: Past Medical History:  Diagnosis Date   Arthritis    Asthma    CAD (coronary artery disease)    a. LHC 7/19: ostLM 30%, ostLAD 30%, p-mLAD 99% mod calcified, ostD1 90%, ost-pLCx 80% ulcerative & mod calcified, p-mLCx 50%, OM2 50%, mod dz LPDA, RCA small w/ diffuse dz throughout; b. recommendation of CABG; c. 3-V CABG 08/26/18 (LIMA-LAD, VG-Diag, VG-LCx)   Chronic systolic CHF (congestive heart failure) (HCC)    a. 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 DVT of left subclavian dx'd 06/17/2018; b. Eliquis   GERD (gastroesophageal reflux disease)    takes otc occasionally   History of complete heart block    a. s/p MDT  PPM 05/2017   Hypertension    Obesity    Presence of permanent cardiac pacemaker    Dr. Rosette Reveal implanted 05/2017   PVC's (premature ventricular contractions)    a. PPM-induced    Surgical History: Past Surgical History:  Procedure Laterality Date   A-FLUTTER ABLATION N/A 03/03/2020   Procedure: A-FLUTTER ABLATION;  Surgeon: Duke Salvia, MD;  Location: [facility] INVASIVE CV LAB;  Service: Cardiovascular;  Laterality: N/A;   BACK SURGERY     CORONARY ARTERY BYPASS GRAFT N/A 08/26/2018   Procedure: CORONARY ARTERY BYPASS GRAFTING (CABG) x3 , Using left internal mammory artery and right leg greater saphronious vein. Harvested endoscopically.;  Surgeon: Kerin Perna, MD;  Location: [facility] OR;  Service: Open Heart Surgery;  Laterality: N/A;   EPICARDIAL PACING LEAD PLACEMENT N/A 08/26/2018   Procedure: LV PACING LEAD PLACEMENT;  Surgeon: Kerin Perna, MD;  Location: [facility] OR;  Service: Thoracic;  Laterality: N/A;   EPICARDIAL PACING LEAD PLACEMENT Left 11/07/2018   Procedure: REVISE PACEMAKER LEAD LEFT CHEST;  Surgeon: Kerin Perna, MD;  Location: [facility] OR;  Service: Thoracic;  Laterality: Left;   INSERT / REPLACE / REMOVE PACEMAKER     [employer]   LEFT HEART CATH AND CORONARY ANGIOGRAPHY N/A 06/13/2018   Procedure: LEFT HEART CATH AND CORONARY ANGIOGRAPHY;  Surgeon: Iran Ouch, MD;  Location: [facility] INVASIVE CV LAB;  Service: Cardiovascular;  Laterality: N/A;   LEG SURGERY     PACEMAKER IMPLANT N/A  06/11/2017   Procedure: Pacemaker Implant;  Surgeon: Marinus Maw, MD;  Location: [facility] INVASIVE CV LAB;  Service: Cardiovascular;  Laterality: N/A;   PERIPHERAL VASCULAR THROMBECTOMY Right 06/13/2023   Procedure: PERIPHERAL VASCULAR THROMBECTOMY;  Surgeon: Annice Needy, MD;  Location: [facility] INVASIVE CV LAB;  Service: Cardiovascular;  Laterality: Right;   PLEURAL EFFUSION DRAINAGE Left 09/24/2018   Procedure: DRAINAGE OF LOCULATED PLEURAL EFFUSION;  Surgeon: Kerin Perna, MD;  Location: [facility] OR;   Service: Thoracic;  Laterality: Left;   TEE WITHOUT CARDIOVERSION N/A 08/26/2018   Procedure: TRANSESOPHAGEAL ECHOCARDIOGRAM (TEE);  Surgeon: Donata Clay, Theron Arista, MD;  Location: [facility] OR;  Service: Open Heart Surgery;  Laterality: N/A;   TEE WITHOUT CARDIOVERSION  03/03/2020   Procedure: Transesophageal Echocardiogram (Tee);  Surgeon: Duke Salvia, MD;  Location: [facility] INVASIVE CV LAB;  Service: Cardiovascular;;   VIDEO ASSISTED THORACOSCOPY Left 09/24/2018   Procedure: VIDEO ASSISTED THORACOSCOPY;  Surgeon: Donata Clay, Theron Arista, MD;  Location: [facility] OR;  Service: Thoracic;  Laterality: Left;    Family History: Family History  Problem Relation Age of Onset   Stroke Mother    Stroke Father    Alcohol abuse Father    Diabetes Brother    Alcohol abuse Brother    Stroke Maternal Grandfather     Social History:  reports that he has never smoked. He has never used smokeless tobacco. He reports current alcohol use. He reports that he does not use drugs.  Physical Exam: BP 124/78 (BP Location: Right Arm, Patient Position: Sitting, Cuff Size: Normal)   Pulse 80   SpO2 97%    Constitutional: In wheelchair, frail appearing Cardiovascular: No clubbing, cyanosis, or edema. Respiratory: Normal respiratory effort, no increased work of breathing. GI: Abdomen is soft, nontender, nondistended, no abdominal masses   Laboratory Data: Viewed, see HPI  Pertinent Imaging: I have personally viewed and interpreted the CT scan from November 2019 showing a 46 g prostate, no hydronephrosis.  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  Reassurance provided regarding very mildly elevated PSA, that BPH is the most likely etiology of his urinary retention.  Foley catheter was removed today and he returned this afternoon and has only  been able to void a small amount with significantly elevated PVR of .  I recommended Foley catheter replacement, see procedure note for details.  I had a very long conversation with patient, his wife, and daughter about treatment options.  They would like to avoid any surgical options if at all possible which I think is very reasonable with his other comorbidities, history of DVT and anticoagulation.  Not a candidate for prostate artery embolization with his CKD.  Best option for outlet procedure would be HOLEP.  Risk and benefits were reviewed.  In agreement for treatment with maximal medical therapy with Flomax 0.8 mg nightly and finasteride daily, we discussed finasteride typically can take up to 6 months to see improvement in urinary symptoms.  I also recommended stopping the oxybutynin which can contribute to retention and confusion.  Using shared decision making they would like to return in 2 weeks for repeat voiding trial.  Flomax increased to 0.8 mg nightly, continue finasteride daily, stop oxybutynin RTC 2 weeks voiding trial-> if fails voiding trial at that time can consider either repeat voiding trial in 2 to 4  weeks per family preference or proceeding to HOLEP, would need clearance from vascular surgery   I spent 65 total minutes on the day of the encounter including pre-visit review of the medical record, face-to-face time with the patient, and post visit ordering of labs/imaging/tests.  Extensive review of recent hospitalization notes, prior vascular surgery notes, and imaging.  Legrand Rams, MD 02/20/2024  [facility] [address] [place], [place] [address] [phone]

## 2024-02-25 ENCOUNTER — Other Ambulatory Visit: Payer: Self-pay | Admitting: Cardiology

## 2024-02-25 DIAGNOSIS — N3001 Acute cystitis with hematuria: Secondary | ICD-10-CM | POA: Diagnosis not present

## 2024-02-25 DIAGNOSIS — I130 Hypertensive heart and chronic kidney disease with heart failure and stage 1 through stage 4 chronic kidney disease, or unspecified chronic kidney disease: Secondary | ICD-10-CM | POA: Diagnosis not present

## 2024-02-25 DIAGNOSIS — B9620 Unspecified Escherichia coli [E. coli] as the cause of diseases classified elsewhere: Secondary | ICD-10-CM | POA: Diagnosis not present

## 2024-02-25 DIAGNOSIS — E1122 Type 2 diabetes mellitus with diabetic chronic kidney disease: Secondary | ICD-10-CM | POA: Diagnosis not present

## 2024-02-25 DIAGNOSIS — I509 Heart failure, unspecified: Secondary | ICD-10-CM | POA: Diagnosis not present

## 2024-02-25 DIAGNOSIS — N184 Chronic kidney disease, stage 4 (severe): Secondary | ICD-10-CM | POA: Diagnosis not present

## 2024-02-25 DIAGNOSIS — D631 Anemia in chronic kidney disease: Secondary | ICD-10-CM | POA: Diagnosis not present

## 2024-03-04 ENCOUNTER — Ambulatory Visit: Admitting: Physician Assistant

## 2024-03-04 DIAGNOSIS — R339 Retention of urine, unspecified: Secondary | ICD-10-CM | POA: Diagnosis not present

## 2024-03-04 LAB — BLADDER SCAN AMB NON-IMAGING: Scan Result: 575

## 2024-03-04 NOTE — Progress Notes (Signed)
 03/04/2024 3:21 PM   [name] 05/09/1941 [id number]  CC: Chief Complaint  Patient presents with   Urinary Retention   HPI: Veryl Winemiller. is a 83 y.o. male with PMH BPH on Flomax and finasteride, CKD 3, CAD, systolic heart failure, extensive DVT/PE in July 2024 s/p thrombectomy and IVC filter placement on Eliquis, and a recent history of urinary retention in the setting of UTI and oxybutynin use who presents today for repeat voiding trial off oxybutynin and on Flomax 0.8 mg daily.  He is accompanied today by his wife, who contributes to HPI.  Foley catheter removed in the morning, see separate procedure note for details.  He returned to clinic in the afternoon for PVR.  He reports he has voided multiple times today. PVR .  He denies pain.  He desires to pursue HOLEP.  Notably, he has had some orthostasis over the past several weeks.  PMH: Past Medical History:  Diagnosis Date   Arthritis    Asthma    CAD (coronary artery disease)    a. LHC 7/19: ostLM 30%, ostLAD 30%, p-mLAD 99% mod calcified, ostD1 90%, ost-pLCx 80% ulcerative & mod calcified, p-mLCx 50%, OM2 50%, mod dz LPDA, RCA small w/ diffuse dz throughout; b. recommendation of CABG; c. 3-V CABG 08/26/18 (LIMA-LAD, VG-Diag, VG-LCx)   Chronic systolic CHF (congestive heart failure) (HCC)    a. 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 DVT of left subclavian dx'd 06/17/2018; b. Eliquis   GERD (gastroesophageal reflux disease)    takes otc occasionally   History of complete heart block    a. s/p MDT PPM 05/2017   Hypertension    Obesity    Presence of permanent cardiac pacemaker    Dr. Rosette Reveal implanted 05/2017   PVC's (premature ventricular contractions)    a. PPM-induced     Surgical History: Past Surgical History:  Procedure Laterality Date   A-FLUTTER ABLATION N/A 03/03/2020   Procedure: A-FLUTTER ABLATION;  Surgeon: Duke Salvia, MD;  Location: [facility] INVASIVE CV LAB;  Service: Cardiovascular;  Laterality: N/A;   BACK SURGERY     CORONARY ARTERY BYPASS GRAFT N/A 08/26/2018   Procedure: CORONARY ARTERY BYPASS GRAFTING (CABG) x3 , Using left internal mammory artery and right leg greater saphronious vein. Harvested endoscopically.;  Surgeon: Kerin Perna, MD;  Location: [facility] OR;  Service: Open Heart Surgery;  Laterality: N/A;   EPICARDIAL PACING LEAD PLACEMENT N/A 08/26/2018   Procedure: LV PACING LEAD PLACEMENT;  Surgeon: Kerin Perna, MD;  Location: [facility] OR;  Service: Thoracic;  Laterality: N/A;   EPICARDIAL PACING LEAD PLACEMENT Left 11/07/2018   Procedure: REVISE PACEMAKER LEAD LEFT CHEST;  Surgeon: Kerin Perna, MD;  Location: [facility] OR;  Service: Thoracic;  Laterality: Left;   INSERT / REPLACE / REMOVE PACEMAKER     [employer]   LEFT HEART CATH AND CORONARY ANGIOGRAPHY N/A 06/13/2018   Procedure: LEFT HEART CATH AND CORONARY ANGIOGRAPHY;  Surgeon: Iran Ouch, MD;  Location: [facility] INVASIVE CV LAB;  Service: Cardiovascular;  Laterality: N/A;   LEG SURGERY     PACEMAKER IMPLANT N/A 06/11/2017   Procedure: Pacemaker Implant;  Surgeon: Marinus Maw, MD;  Location: [facility]  INVASIVE CV LAB;  Service: Cardiovascular;  Laterality: N/A;   PERIPHERAL VASCULAR THROMBECTOMY Right 06/13/2023   Procedure: PERIPHERAL VASCULAR THROMBECTOMY;  Surgeon: Celso College, MD;  Location: [facility] INVASIVE CV LAB;  Service: Cardiovascular;  Laterality: Right;   PLEURAL EFFUSION DRAINAGE Left 09/24/2018   Procedure: DRAINAGE OF LOCULATED PLEURAL EFFUSION;  Surgeon: Heriberto London, MD;  Location: [facility] OR;  Service: Thoracic;  Laterality: Left;   TEE WITHOUT CARDIOVERSION N/A 08/26/2018   Procedure: TRANSESOPHAGEAL ECHOCARDIOGRAM (TEE);  Surgeon: Matt Song, Donata Fryer, MD;  Location: [facility] OR;   Service: Open Heart Surgery;  Laterality: N/A;   TEE WITHOUT CARDIOVERSION  03/03/2020   Procedure: Transesophageal Echocardiogram (Tee);  Surgeon: Verona Goodwill, MD;  Location: [facility] INVASIVE CV LAB;  Service: Cardiovascular;;   VIDEO ASSISTED THORACOSCOPY Left 09/24/2018   Procedure: VIDEO ASSISTED THORACOSCOPY;  Surgeon: Heriberto London, MD;  Location: [facility] OR;  Service: Thoracic;  Laterality: Left;    Home Medications:  Allergies as of 03/04/2024       Reactions   Azithromycin Hives   Erythromycin Hives        Medication List        Accurate as of March 04, 2024  3:21 PM. If you have any questions, ask your nurse or doctor.          acetaminophen 500 MG tablet Commonly known as: TYLENOL Take 500 mg by mouth every 6 (six) hours as needed for moderate pain or headache.   allopurinol 100 MG tablet Commonly known as: ZYLOPRIM Take 100 mg by mouth daily. At night   apixaban 5 MG Tabs tablet Commonly known as: ELIQUIS Take 1 tablet (5 mg total) by mouth 2 (two) times daily.   atorvastatin 40 MG tablet Commonly known as: LIPITOR TAKE ONE TABLET EVERY DAY   feeding supplement (NEPRO CARB STEADY) Liqd Take 237 mLs by mouth 2 (two) times daily between meals.   ferrous sulfate 325 (65 FE) MG EC tablet Take 325 mg by mouth 2 (two) times daily.   finasteride 5 MG tablet Commonly known as: PROSCAR Take 5 mg by mouth daily.   loratadine 10 MG tablet Commonly known as: Claritin Take 1 tablet (10 mg total) by mouth daily.   metoprolol succinate 25 MG 24 hr tablet Commonly known as: TOPROL-XL TAKE 1 TABLET BY MOUTH DAILY   multivitamin with minerals Tabs tablet Take 1 tablet by mouth daily.   omeprazole 20 MG capsule Commonly known as: PRILOSEC Take 20 mg by mouth daily.   Primatene Mist 0.125 MG/ACT Aero Generic drug: EPINEPHrine Inhale 1 puff into the lungs daily as needed (shortness of breath).   PROSTATE PO Take 1 tablet by mouth at bedtime.   tamsulosin  0.4 MG Caps capsule Commonly known as: FLOMAX Take 0.8 mg by mouth daily after supper.        Allergies:  Allergies  Allergen Reactions   Azithromycin Hives   Erythromycin Hives    Family History: Family History  Problem Relation Age of Onset   Stroke Mother    Stroke Father    Alcohol abuse Father    Diabetes Brother    Alcohol abuse Brother    Stroke Maternal Grandfather     Social History:   reports that he has never smoked. He has never used smokeless tobacco. He reports current alcohol use. He reports that he does not use drugs.  Physical Exam: There were no vitals taken for this visit.  Constitutional:  Alert and oriented, no  acute distress, nontoxic appearing HEENT: [place], AT Cardiovascular: No clubbing, cyanosis, or edema Respiratory: Normal respiratory effort, no increased work of breathing Skin: No rashes, bruises or suspicious lesions Neurologic: Grossly intact, no focal deficits, moving all 4 extremities Psychiatric: Normal mood and affect  Laboratory Data: Results for orders placed or performed in visit on 03/04/24  BLADDER SCAN AMB NON-IMAGING   Collection Time: 03/04/24  3:32 PM  Result Value Ref Range   Scan Result 575    Simple Catheter Placement  Due to urinary retention patient is present today for a foley cath placement.  Patient was cleaned and prepped in a sterile fashion with betadine and 2% lidocaine jelly was instilled into the urethra. A 16 FR coude foley catheter was inserted, urine return was noted  , urine was yellow in color.  The balloon was filled with 10cc of sterile water.  A night bag was attached for drainage. Patient tolerated well, no complications were noted   Performed by: Lou Loewe, PA-C   Assessment & Plan:   1. Urinary retention (Primary) Voiding trial failed.  I offered him Foley catheter replacement and he agreed, see above.  He wishes to pursue HOLEP and defer additional voiding trials, which is  reasonable.  Or orders placed today, will get clearance from vascular surgery and have him keep his IVC filter in place until HOLEP is performed.  I obtained a urine specimen from his new catheter today for preop culture and anticipate putting him on antibiotics in advance of surgery.  We discussed postoperative expectations including dysuria, gross hematuria, and urinary leakage, including the rare risk of chronic urinary incontinence and low probability of persistent urinary retention despite HOLEP.  Additionally, I am stopping Flomax given reports of orthostasis and plans for HOLEP.  I asked him to continue finasteride. - BLADDER SCAN AMB NON-IMAGING - Ambulatory Referral For Surgery Scheduling - CULTURE, URINE COMPREHENSIVE   Return for Will call to schedule surgery.  Kathreen Pare, PA-C  [facility] [place] [address], [address] [place], [place] [address] [phone]

## 2024-03-04 NOTE — Progress Notes (Signed)
Catheter Removal  Patient is present today for a catheter removal.  43m of water was drained from the balloon. A 18FR foley cath was removed from the bladder, no complications were noted. Patient tolerated well.  Performed by: JGaspar ColaCMA  Follow up/ Additional notes: This afternoon

## 2024-03-05 ENCOUNTER — Other Ambulatory Visit: Payer: Self-pay

## 2024-03-05 DIAGNOSIS — N138 Other obstructive and reflux uropathy: Secondary | ICD-10-CM

## 2024-03-05 NOTE — Progress Notes (Unsigned)
 Surgical Physician Order Form [facility] [place]  Dr. Estanislao Heimlich * Scheduling expectation : Next Available  *Length of Case:   *Clearance needed: yes, vascular surgery (please keep IVC filter for now so he can stop Eliquis for surgery)  *Anticoagulation Instructions: Hold all anticoagulants  *Aspirin Instructions: N/A  *Post-op visit Date/Instructions:  1-3 day cath removal  *Diagnosis: BPH w/urinary obstruction  *Procedure: HOLEP (16109)   Additional orders: N/A  -Admit type: OUTpatient  -Anesthesia: General  -VTE Prophylaxis Standing Order SCD's       Other:   -Standing Lab Orders Per Anesthesia    Lab other: None  -Standing Test orders EKG/Chest x-ray per Anesthesia       Test other:   - Medications:  Ancef 2gm IV  -Other orders:  N/A

## 2024-03-06 ENCOUNTER — Telehealth: Payer: Self-pay

## 2024-03-06 ENCOUNTER — Telehealth: Payer: Self-pay | Admitting: *Deleted

## 2024-03-06 NOTE — Progress Notes (Signed)
  Phone Number: [phone] for Surgical Coordinator Fax Number: [phone]  REQUEST FOR SURGICAL CLEARANCE       Date: Date: 03/06/24  [facility]ed [facility]  Surgeon: Dr. Jay Meth, MD     Date of Surgery: 03/28/2024  Operation: Holmium Laser Enucleation of the Prostate   Anesthesia Type: General   Diagnosis: Benign Prostatic Hyperplasia with Urinary Obstruction  Patient Requires:   Cardiac / Vascular Clearance : Yes  Reason: Patient with [employer]- will need clearance.     Risk Assessment:    Low   []       Moderate   []     High   []           This patient is optimized for surgery  YES []       NO   []    I recommend further assessment/workup prior to surgery. YES []      NO  []   Appointment scheduled for: _______________________   Further recommendations: ____________________________________     Physician Signature:__________________________________   Printed Name: ________________________________________   Date: _________________

## 2024-03-06 NOTE — Progress Notes (Signed)
  Phone Number: [phone] for Surgical Coordinator Fax Number: [phone]  REQUEST FOR SURGICAL CLEARANCE     Date: Date: 03/06/24  Faxed to: Dr. Vonna Guardian, MD  Surgeon: Dr. Jay Meth, MD     Date of Surgery: 04/04/2024  Operation: Holmium Laser Enucleation of the Prostate   Anesthesia Type: General   Diagnosis: Benign Prostatic Hyperplasia with Urinary Obstruction  Patient Requires:   Cardiac / Vascular Clearance : Yes  Reason: Would like for patient to hold Eliquis prior to surgery if able to keep IVC Filter in past surgery date.      Risk Assessment:    Low   []       Moderate   []     High   []           This patient is optimized for surgery  YES []       NO   []    I recommend further assessment/workup prior to surgery. YES []      NO  []   Appointment scheduled for: _______________________   Further recommendations: ____________________________________     Physician Signature:__________________________________   Printed Name: ________________________________________   Date: _________________

## 2024-03-06 NOTE — Telephone Encounter (Signed)
 Per Dr. Estanislao Heimlich, Patient is to be scheduled for Holmium Laser Enucleation of the Prostate   Mr. Sean [name] and wife Sean [name] was contacted and possible surgical dates were discussed, Friday May 9th, 2025 was agreed upon for surgery.   Patient was directed to call [phone] between 1-3pm the day before surgery to find out surgical arrival time.  Instructions were given not to eat or drink from midnight on the night before surgery and have a driver for the day of surgery. On the surgery day patient was instructed to enter through the [facility] entrance of [facility] report the Same Day Surgery desk.   Pre-Admit Testing will be in contact via phone to set up an interview with the anesthesia team to review your history and medications prior to surgery.   Reminder of this information was sent via Mail to the patient.

## 2024-03-06 NOTE — Telephone Encounter (Signed)
 I sent a secure chat to surgery scheduler in regard to if needing Eliquis to be held. Per Melissa, yes, however; Dr. Vonna Guardian gave clearance for the Eliquis already. Just need device and cardiac clearance.

## 2024-03-06 NOTE — Telephone Encounter (Signed)
   Name: Sean [name].  DOB: 11-21-1940  MRN: [id number]  Primary Cardiologist: None   Preoperative team, please contact this patient and set up a phone call appointment for further preoperative risk assessment. Please obtain consent and complete medication review. Thank you for your help.  I confirm that guidance regarding antiplatelet and oral anticoagulation therapy has been completed and, if necessary, noted below.  Guidance on Eliquis was provided by Dr. Vonna Guardian per phone notes and already communicated to patient and his wife.  Device clearance is also needed and was forwarded to their inbox.  I also confirmed the patient resides in the state of [place] . As per [facility] Medical Board telemedicine laws, the patient must reside in the state in which the provider is licensed.   Francene Ing, Retha Cast, NP 03/06/2024, 11:42 AM [facility]

## 2024-03-06 NOTE — Telephone Encounter (Signed)
 Last OV with EP was 11/2021. Will need to reestablish prior to clearance completed.

## 2024-03-06 NOTE — Telephone Encounter (Signed)
 Received clearance from Dr. Vonna Guardian. Spoke with pt. Wife, Haskell Linker. Communicated information and parameters to her. Patient will hold Eliquis for 3 days prior to surgery. Verbalized understanding.

## 2024-03-06 NOTE — Telephone Encounter (Signed)
 I s/w the pt and his wife. Pt's wife states the pt is [facility] and she will need to help with the tele appt. I said that is fine as long as the pt is there with her as well. Med rec and consent are done.   Tele preop appt 03/19/24.

## 2024-03-06 NOTE — Telephone Encounter (Signed)
   Pre-operative Risk Assessment    Patient Name: Sean [name].  DOB: 1941/10/05 MRN: [id number]   Date of last office visit: 11/06/23 DR. ARIDA Date of next office visit: 05/20/24 DR. ARIDA   Request for Surgical Clearance    Procedure:   HOLMIUM LASER ENUCLEATION OF THE PROSTATE  Date of Surgery:  Clearance 04/04/24                                Surgeon:  DR. Estanislao Heimlich Surgeon's Group or Practice Name:  [facility] Phone number:  [phone]; Sheryn Doom, CMA Fax number:  [phone]   Type of Clearance Requested:   - Medical  - Pharmacy:  Hold Apixaban (Eliquis)   DR. Estanislao Heimlich NEED DEVICE CLEARANCE  AS WELL (I WILL SEND TO DEVICE NURSE AS WELL)   Type of Anesthesia:  General    Additional requests/questions:    Signed, Rainier Feuerborn   03/06/2024, 11:19 AM

## 2024-03-06 NOTE — Telephone Encounter (Signed)
-----   Message from Mikki Alexander sent at 03/06/2024 10:51 AM EDT ----- I think leaving the filter in until after his surgery would be a perfect plan.  I am okay holding the Eliquis 2 or 3 days before the surgery and resuming it a day or 2 after the surgery as long as he has the filter in place.  We can check a DVT study a few weeks after his surgery and if he does not have a new DVT, I can talk to him about taking the filter out after that. ----- Message ----- From: Sheryn Doom, CMA Sent: 03/06/2024  10:38 AM EDT To: [employer], MD  Surgical Clearance

## 2024-03-06 NOTE — Telephone Encounter (Signed)
 I s/w the pt and his wife. Pt's wife states the pt is [name] and she will need to help with the tele appt. I said that is fine as long as the pt is there with her as well. Med rec and consent are done.  Tele preop appt 03/19/24.     Patient Consent for Virtual Visit        Sean [name]. has provided verbal consent on 03/06/2024 for a virtual visit (video or telephone).   CONSENT FOR VIRTUAL VISIT FOR:  Sean [name].  By participating in this virtual visit I agree to the following:  I hereby voluntarily request, consent and authorize [facility] and its employed or contracted physicians, physician assistants, nurse practitioners or other licensed health care professionals (the Practitioner), to provide me with telemedicine health care services (the "Services") as deemed necessary by the treating Practitioner. I acknowledge and consent to receive the Services by the Practitioner via telemedicine. I understand that the telemedicine visit will involve communicating with the Practitioner through live audiovisual communication technology and the disclosure of certain medical information by electronic transmission. I acknowledge that I have been given the opportunity to request an in-person assessment or other available alternative prior to the telemedicine visit and am voluntarily participating in the telemedicine visit.  I understand that I have the right to withhold or withdraw my consent to the use of telemedicine in the course of my care at any time, without affecting my right to future care or treatment, and that the Practitioner or I may terminate the telemedicine visit at any time. I understand that I have the right to inspect all information obtained and/or recorded in the course of the telemedicine visit and may receive copies of available information for a reasonable fee.  I understand that some of the potential risks of receiving the Services via telemedicine include:  Delay or  interruption in medical evaluation due to technological equipment failure or disruption; Information transmitted may not be sufficient (e.g. poor resolution of images) to allow for appropriate medical decision making by the Practitioner; and/or  In rare instances, security protocols could fail, causing a breach of personal health information.  Furthermore, I acknowledge that it is my responsibility to provide information about my medical history, conditions and care that is complete and accurate to the best of my ability. I acknowledge that Practitioner's advice, recommendations, and/or decision may be based on factors not within their control, such as incomplete or inaccurate data provided by me or distortions of diagnostic images or specimens that may result from electronic transmissions. I understand that the practice of medicine is not an exact science and that Practitioner makes no warranties or guarantees regarding treatment outcomes. I acknowledge that a copy of this consent can be made available to me via my patient portal [facility] MyChart), or I can request a printed copy by calling the office of  [facility].    I understand that my insurance will be billed for this visit.   I have read or had this consent read to me. I understand the contents of this consent, which adequately explains the benefits and risks of the Services being provided via telemedicine.  I have been provided ample opportunity to ask questions regarding this consent and the Services and have had my questions answered to my satisfaction. I give my informed consent for the services to be provided through the use of telemedicine in my medical care

## 2024-03-06 NOTE — Progress Notes (Signed)
   [facility] Surgical Posting Form  Surgery Date: Date: 04/04/2024  Surgeon: Dr. Jay Meth, MD  Inpt ( No  )   Outpt (Yes)   Obs ( No  )   Diagnosis: N40.1, N13.8 Benign Prostatic Hyperplasia with Urinary Obstruction  -CPT: [phone]  Surgery: Holmium Laser Enucleation of the Prostate  Stop Anticoagulations: Yes, will need to hold Eliquis- plan to keep IVC filter through surgery date  Cardiac/Medical/Pulmonary Clearance needed: Yes  Clearance needed from Dr: Vonna Guardian  Clearance request sent on: Date: 03/06/24  *Orders entered into EPIC  Date: 03/06/24   *Case booked in EPIC  Date: 03/05/2024  *Notified pt of Surgery: Date: 03/05/2024  PRE-OP UA & CX: no  *Placed into Prior Authorization Work [place] Date: 03/06/24  Assistant/laser/rep:No

## 2024-03-07 LAB — CULTURE, URINE COMPREHENSIVE

## 2024-03-07 NOTE — Telephone Encounter (Signed)
 I have cancelled the tele preop appt for 03/19/24. Pt's wife called today and stated that they have scheduled in office 03/18/24 with DR. Parker.   I will update all parties involved.

## 2024-03-07 NOTE — Telephone Encounter (Signed)
 I will send this to the preop APP to review notes from Dr. Daneil Dunker about preop clearance appt.

## 2024-03-08 ENCOUNTER — Encounter: Payer: Self-pay | Admitting: Internal Medicine

## 2024-03-17 NOTE — Progress Notes (Unsigned)
 Electrophysiology Office Note:   Date:  03/18/2024  ID:  Sean [name]., DOB 1940/12/22, MRN [id number]  Primary Cardiologist: None Electrophysiologist: None      History of Present Illness:   Sean [name]. is a 83 y.o. male with h/o chronic systolic heart failure, coronary artery disease s/p CABG 2019, complete heart block s/p pacemaker, stage III chronic kidney disease, atrial flutter s/p ablation 02/2020, hypertension, obesity, DVT/PE who is being seen today for follow up of his cardiac device.  Discussed the use of AI scribe software for clinical note transcription with the patient, who gave verbal consent to proceed.  History of Present Illness Patient has upcoming urological surgery scheduled. He currently has an indwelling foley catheter. This causes him some discomfort. Otherwise he reports doing relatively well. He maintains an active lifestyle, engaging in daily activities without significant limitations. He is able to walk from his house to the car and perform household tasks. He has started walking up and down the driveway and plans to walk to a nearby school to increase his activity level. He states that he is able to descend his basement staircase and climb back up without issue. He experiences no shortness of breath, dizziness, or lightheadedness during these activities. He denies any history of chest pain since his bypass surgery. He is able to lay flat in bed without orthopnea or PND. No lower extremity swelling since starting treatment for his DVT. No new or acute complaints.   Review of systems complete and found to be negative unless listed in HPI.   EP Information / Studies Reviewed:    EKG is ordered today. Personal review as below.      Echo 06/14/23:   1. Left ventricular ejection fraction, by estimation, is 45 to 50%. The  left ventricle has mildly decreased function. The left ventricle  demonstrates regional wall motion abnormalities (hypokinesis of the   anterior, anteroseptal and apical region). Left  ventricular diastolic parameters are consistent with Grade I diastolic  dysfunction (impaired relaxation).   2. Right ventricular systolic function is normal. The right ventricular  size is normal. There is moderately elevated pulmonary artery systolic  pressure. The estimated right ventricular systolic pressure is 54.0 mmHg.   3. Right atrial size was mildly dilated.   4. The mitral valve is normal in structure. No evidence of mitral valve  regurgitation. No evidence of mitral stenosis.   5. The aortic valve has an indeterminant number of cusps. Aortic valve  regurgitation is not visualized. Aortic valve sclerosis is present, with  no evidence of aortic valve stenosis.   6. The inferior vena cava is normal in size with greater than 50%  respiratory variability, suggesting right atrial pressure of 3 mmHg.   Risk Assessment/Calculations:    CHA2DS2-VASc Score = 5   This indicates a 7.2% annual risk of stroke. The patient's score is based upon: CHF History: 1 HTN History: 1 Diabetes History: 0 Stroke History: 0 Vascular Disease History: 1 Age Score: 2 Gender Score: 0             Physical Exam:   VS:  BP 126/72   Pulse 76   Ht 5' 10.5" (1.791 m)   Wt 209 lb 3.2 oz (94.9 kg)   SpO2 97%   BMI 29.59 kg/m    Wt Readings from Last 3 Encounters:  03/18/24 209 lb 3.2 oz (94.9 kg)  01/26/24 223 lb 14.4 oz (101.6 kg)  12/04/23 229 lb (103.9 kg)  GEN: Well nourished, well developed in no acute distress NECK: No JVD CARDIAC: Normal rate, regular rhythm RESPIRATORY:  Clear to auscultation without rales, wheezing or rhonchi  ABDOMEN: Soft, non-distended EXTREMITIES:  No edema; No deformity   ASSESSMENT AND PLAN:    #. Complete heart block s/p dual chamber pacemaker: Presenting rhythm AS-VP -In clinic device interrogation performed today with appropriate device function and stable lead parameters. -Continue remote  monitoring.  #. Paroxysmal atrial fibrillation: 1.4% on his device. Appears to be asymptomatic.  #. Typical atrial flutter s/p CTI ablation 02/2020:  #. 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  - Continue metoprolol  XL 25mg  once daily.  #. Chronic systolic heart failure: Well compensated. No obvious signs or symptoms of HF. #. Ischemic cardiomyopathy:  #. CAD s/p CABG: Denies chest pain.  - Continue metoprolol  XL 25mg  once daily. Continue follow up with primary cardiologist, Dr. Alvenia Aus, for management. - Repeat echocardiogram in anticipation of surgery.    #. History of RLE DVT and PE: -Continue Eliquis  5mg  BID.    #. Preoperative evaluation: He is currently well compensated. Denies chest pain. No obvious signs or symptoms of heart failure. Last EF 45-50%. He has minimal AF, currently 1.4% burden by his device. From a functional status perspective, he states he can go down his basement stairs and climb back up without issue or symptoms. Based on his cardiac history he is high risk for a moderate risk surgery.  - Repeat echocardiogram. If no significant change from prior then no additional workup is needed.  - Given AF with CHADSVASC score of 5 and history of RLE DVT with PE, would recommend minimizing interruption of oral anti-coagulation as much as possible.   Follow up with Dr. Daneil Dunker in 6 months.  Signed, Ardeen Kohler, MD

## 2024-03-18 ENCOUNTER — Encounter: Payer: Self-pay | Admitting: Cardiology

## 2024-03-18 ENCOUNTER — Ambulatory Visit: Attending: Cardiology | Admitting: Cardiology

## 2024-03-18 VITALS — BP 126/72 | HR 76 | Ht 70.5 in | Wt 209.2 lb

## 2024-03-18 DIAGNOSIS — Z951 Presence of aortocoronary bypass graft: Secondary | ICD-10-CM | POA: Diagnosis not present

## 2024-03-18 DIAGNOSIS — D6869 Other thrombophilia: Secondary | ICD-10-CM | POA: Diagnosis not present

## 2024-03-18 DIAGNOSIS — Z950 Presence of cardiac pacemaker: Secondary | ICD-10-CM | POA: Diagnosis not present

## 2024-03-18 DIAGNOSIS — I483 Typical atrial flutter: Secondary | ICD-10-CM | POA: Diagnosis not present

## 2024-03-18 DIAGNOSIS — Z01810 Encounter for preprocedural cardiovascular examination: Secondary | ICD-10-CM | POA: Diagnosis not present

## 2024-03-18 DIAGNOSIS — I480 Paroxysmal atrial fibrillation: Secondary | ICD-10-CM | POA: Diagnosis not present

## 2024-03-18 DIAGNOSIS — I5022 Chronic systolic (congestive) heart failure: Secondary | ICD-10-CM

## 2024-03-18 DIAGNOSIS — I255 Ischemic cardiomyopathy: Secondary | ICD-10-CM | POA: Diagnosis not present

## 2024-03-18 DIAGNOSIS — I442 Atrioventricular block, complete: Secondary | ICD-10-CM | POA: Diagnosis not present

## 2024-03-18 NOTE — Patient Instructions (Addendum)
 Medication Instructions:  Your physician recommends that you continue on your current medications as directed. Please refer to the Current Medication list given to you today.  *If you need a refill on your cardiac medications before your next appointment, please call your pharmacy*  Testing/Procedures: Echocardiogram - prior to 5/16 Your physician has requested that you have an echocardiogram. Echocardiography is a painless test that uses sound waves to create images of your heart. It provides your doctor with information about the size and shape of your heart and how well your heart's chambers and valves are working. This procedure takes approximately one hour. There are no restrictions for this procedure. Please do NOT wear cologne, perfume, aftershave, or lotions (deodorant is allowed). Please arrive 15 minutes prior to your appointment time.  Please note: We ask at that you not bring children with you during ultrasound (echo/ vascular) testing. Due to room size and safety concerns, children are not allowed in the ultrasound rooms during exams. Our front office staff cannot provide observation of children in our lobby area while testing is being conducted. An adult accompanying a patient to their appointment will only be allowed in the ultrasound room at the discretion of the ultrasound technician under special circumstances. We apologize for any inconvenience.  Follow-Up: At [facility], you and your health needs are our priority.  As part of our continuing mission to provide you with exceptional heart care, our providers are all part of one team.  This team includes your primary Cardiologist (physician) and Advanced Practice Providers or APPs (Physician Assistants and Nurse Practitioners) who all work together to provide you with the care you need, when you need it.  Your next appointment:   6 months  Provider:   You may see Dr. Daneil Dunker or one of the following Advanced Practice  Providers on your designated Care Team:   Mertha Abrahams, [place] [address] Garrett, PA-C Suzann Riddle, NP Creighton Doffing, NP

## 2024-03-18 NOTE — Progress Notes (Signed)
 PERIOPERATIVE PRESCRIPTION FOR IMPLANTED CARDIAC DEVICE PROGRAMMING  Patient Information: Name:  Sean [name].  DOB:  1940-12-06  MRN:  [id number]   Procedure:   HOLMIUM LASER ENUCLEATION OF THE PROSTATE   Date of Surgery:  Clearance 04/04/24                                 Surgeon:  DR. Estanislao Heimlich Surgeon's Group or Practice Name:  [facility] Phone number:  [phone]; Sheryn Doom, CMA Fax number:  [phone]   Type of Clearance Requested:   -  DEVICE CLEARANCE     Type of Anesthesia:  General  Device Information:  Clinic EP Physician:  Dr. Ardeen Kohler  Device Type:  Pacemaker Manufacturer and Phone #:  Medtronic: [id number] Pacemaker Dependent?:  Yes.   Date of Last Device Check:  03/18/2024 Normal Device Function?:  Yes.    Electrophysiologist's Recommendations:  Have magnet available. Provide continuous ECG monitoring when magnet is used or reprogramming is to be performed.  Procedure should not interfere with device function.  No device programming or magnet placement needed.  Per Device Clinic Standing Orders, Forrestine Ike, RN  2:14 PM 03/18/2024

## 2024-03-19 ENCOUNTER — Telehealth

## 2024-03-19 NOTE — Telephone Encounter (Signed)
 Called and spoke to patient's wife made them aware per preop APP want echo done first than televisit patient's wife voiced understanding televisit has been rescheduled to after test is done

## 2024-03-19 NOTE — Telephone Encounter (Signed)
 We will have to reschedule the tele appt per preop APP 9 days ago. I am not sure why this was not taken care of while I was on vacation.

## 2024-03-19 NOTE — Telephone Encounter (Signed)
 Left message for the pt's wife to call back and reschedule the tele to be done after 03/25/24 as pt needs echo 03/25/24 per Dr. Daneil Dunker. Please see notes from preop APP Katlyn West, NP

## 2024-03-19 NOTE — Telephone Encounter (Signed)
 Discussed with Dr. Daneil Dunker.  Patient currently has echocardiogram pending for 03/25/2024, recommended that tele apt be rescheduled to after echocardiogram.  If echocardiogram is unchanged or normal televisit will not be needed and can be canceled.

## 2024-03-19 NOTE — Telephone Encounter (Signed)
 S/w the pt's the wife, (DPR) per Marlana Silvan, NP to keep the tele appt as planned per the notes from Dr. Daneil Dunker. I called the pt's wife and we rescheduled the tele to 03/20/24 2 pm.  I will update all parties involved.

## 2024-03-20 ENCOUNTER — Telehealth

## 2024-03-21 NOTE — Progress Notes (Signed)
 Remote pacemaker transmission.

## 2024-03-25 ENCOUNTER — Telehealth: Payer: Self-pay

## 2024-03-25 ENCOUNTER — Ambulatory Visit (HOSPITAL_COMMUNITY): Attending: Cardiology

## 2024-03-25 ENCOUNTER — Ambulatory Visit: Admitting: Physician Assistant

## 2024-03-25 DIAGNOSIS — N401 Enlarged prostate with lower urinary tract symptoms: Secondary | ICD-10-CM | POA: Insufficient documentation

## 2024-03-25 DIAGNOSIS — N138 Other obstructive and reflux uropathy: Secondary | ICD-10-CM | POA: Diagnosis not present

## 2024-03-25 DIAGNOSIS — I5022 Chronic systolic (congestive) heart failure: Secondary | ICD-10-CM | POA: Diagnosis not present

## 2024-03-25 LAB — ECHOCARDIOGRAM COMPLETE
Area-P 1/2: 1.83 cm2
Est EF: 40
S' Lateral: 3.9 cm
Single Plane A4C EF: 46.1 %

## 2024-03-25 MED ORDER — SULFAMETHOXAZOLE-TRIMETHOPRIM 400-80 MG PO TABS: 1.0000 | ORAL_TABLET | Freq: Two times a day (BID) | ORAL | 0 refills | Status: AC

## 2024-03-25 NOTE — Progress Notes (Addendum)
 Simple Catheter Placement  Due to urinary retention patient is present today for a foley cath placement.  Patient was cleaned and prepped in a sterile fashion with betadine and 2% lidocaine  jelly was instilled into the urethra. A 16 FR foley catheter was inserted, urine return was noted  200 ml, urine was red/yellowish  in color.  The balloon was filled with 10cc of sterile water.  A night bag was attached for drainage. Patient was also given a night bag to take home and was given instruction on how to change from one bag to another.  Patient was given instruction on proper catheter care.  Patient tolerated well, no complications were noted   Performed by: Jenny Mohs., CMA  Additional notes: Foley was replaced after it "fell out" this morning, question traumatic removal. Will send in Bactrim SS BID x7 days due to concerns for Foley trauma and with upcoming HOLEP.  Follow up: HOLEP 5/16

## 2024-03-25 NOTE — Telephone Encounter (Signed)
 Haskell Linker (wife)  LM on triage line stating pts foley fell out while taking a shower this morning.   Pt is scheduled for Holep with [facility] on 5/16. Foley placed by SV on 4/15.   SV- ok to place foley today. Appt made for 3 pm. Valda Garnet ok to come in earlier. We will get them back as soon as we can.

## 2024-03-27 ENCOUNTER — Ambulatory Visit

## 2024-03-27 ENCOUNTER — Encounter
Admission: RE | Admit: 2024-03-27 | Discharge: 2024-03-27 | Disposition: A | Discharge status: Home / Self Care | Source: Ambulatory Visit | Attending: Urology | Admitting: Urology

## 2024-03-27 HISTORY — DX: Acute embolism and thrombosis of unspecified deep veins of unspecified proximal lower extremity: I82.4Y9

## 2024-03-27 HISTORY — DX: Hemothorax: J94.2

## 2024-03-27 HISTORY — DX: Anemia, unspecified: D64.9

## 2024-03-27 HISTORY — DX: Hyperlipidemia, unspecified: E78.5

## 2024-03-27 HISTORY — DX: Presence of aortocoronary bypass graft: Z95.1

## 2024-03-27 HISTORY — DX: Congenital multiple renal cysts: Q61.02

## 2024-03-27 HISTORY — DX: Sepsis, unspecified organism: A41.9

## 2024-03-27 HISTORY — DX: Chronic obstructive pulmonary disease, unspecified: J44.9

## 2024-03-27 HISTORY — DX: Personal history of pulmonary embolism: Z86.711

## 2024-03-27 HISTORY — DX: Long term (current) use of anticoagulants: Z79.01

## 2024-03-27 HISTORY — DX: Other obstructive and reflux uropathy: N13.8

## 2024-03-27 HISTORY — DX: Gout, unspecified: M10.9

## 2024-03-27 HISTORY — DX: Chronic kidney disease, stage 4 (severe): N18.4

## 2024-03-27 HISTORY — DX: Prediabetes: R73.03

## 2024-03-27 HISTORY — DX: Unspecified hearing loss, unspecified ear: H91.90

## 2024-03-27 HISTORY — DX: Ischemic cardiomyopathy: I25.5

## 2024-03-27 HISTORY — DX: Other obstructive and reflux uropathy: N40.1

## 2024-03-27 HISTORY — DX: Unspecified atrial flutter: I48.92

## 2024-03-27 NOTE — Patient Instructions (Addendum)
 Your procedure is scheduled on:04-04-24 Friday  Report to the Registration Desk on the 1st floor of the [facility].Then proceed to the 2nd floor Surgery Desk To find out your arrival time, please call [phone] between 1PM - 3PM on:04-03-24 Thursday If your arrival time is 6:00 am, do not arrive before that time as the [facility] entrance doors do not open until 6:00 am.  REMEMBER: Instructions that are not followed completely may result in serious medical risk, up to and including death; or upon the discretion of your surgeon and anesthesiologist your surgery may need to be rescheduled.  Do not eat food OR drink any liquids after midnight the night before surgery.  No gum chewing or hard candies.  One week prior to surgery:Stop NOW (03-27-24) Stop Anti-inflammatories (NSAIDS) such as Advil, Aleve, Ibuprofen, Motrin, Naproxen, Naprosyn and Aspirin  based products such as Excedrin, Goody's Powder, BC Powder. Stop ANY OVER THE COUNTER supplements until after surgery (Ferrous Sulfate , Multivitamin)  You may however, continue to take Tylenol  if needed for pain up until the day of surgery.  Stop apixaban  (ELIQUIS ) 3 days prior to surgery-Last dose will be on 03-31-24 Monday  Continue taking all of your other prescription medications up until the day of surgery.  ON THE DAY OF SURGERY ONLY TAKE THESE MEDICATIONS WITH SIPS OF WATER: -omeprazole (PRILOSEC)   No Alcohol for 24 hours before or after surgery.  No Smoking including e-cigarettes for 24 hours before surgery.  No chewable tobacco products for at least 6 hours before surgery.  No nicotine patches on the day of surgery.  Do not use any "recreational" drugs for at least a week (preferably 2 weeks) before your surgery.  Please be advised that the combination of cocaine and anesthesia may have negative outcomes, up to and including death. If you test positive for cocaine, your surgery will be cancelled.  On the morning of surgery  brush your teeth with toothpaste and water, you may rinse your mouth with mouthwash if you wish. Do not swallow any toothpaste or mouthwash.  Do not wear jewelry, make-up, hairpins, clips or nail polish.  For welded (permanent) jewelry: bracelets, anklets, waist bands, etc.  Please have this removed prior to surgery.  If it is not removed, there is a chance that hospital personnel will need to cut it off on the day of surgery.  Do not wear lotions, powders, or perfumes.   Do not shave body hair from the neck down 48 hours before surgery.  Contact lenses, hearing aids and dentures may not be worn into surgery.  Do not bring valuables to the hospital. [facility] is not responsible for any missing/lost belongings or valuables.   Notify your doctor if there is any change in your medical condition (cold, fever, infection).  Wear comfortable clothing (specific to your surgery type) to the hospital.  After surgery, you can help prevent lung complications by doing breathing exercises.  Take deep breaths and cough every 1-2 hours. Your doctor may order a device called an Incentive Spirometer to help you take deep breaths. When coughing or sneezing, hold a pillow firmly against your incision with both hands. This is called "splinting." Doing this helps protect your incision. It also decreases belly discomfort.  If you are being admitted to the hospital overnight, leave your suitcase in the car. After surgery it may be brought to your room.  In case of increased patient census, it may be necessary for you, the patient, to continue your  postoperative care in the Same Day Surgery department.  If you are being discharged the day of surgery, you will not be allowed to drive home. You will need a responsible individual to drive you home and stay with you for 24 hours after surgery.   If you are taking public transportation, you will need to have a responsible individual with you.  Please call the  Pre-admissions Testing Dept. at [phone] if you have any questions about these instructions.  Surgery Visitation Policy:  Patients having surgery or a procedure may have two visitors.  Children under the age of 44 must have an adult with them who is not the patient.

## 2024-03-27 NOTE — Telephone Encounter (Signed)
 Dr. Daneil Dunker,  You saw this patient on 03/18/2024. You wanted him to have an echo prior to prostate surgery. Will you please take a look at the echo from 5/6 and comment on medical clearance for prostate surgery scheduled for 5/16?  Please route your response to P [facility] Preop. I will communicate with requesting office once you have given recommendations.   Thank you!  Morey Ar, NP

## 2024-03-27 NOTE — Progress Notes (Signed)
 Emailed surgery instructions to pts wife Haskell Linker

## 2024-03-27 NOTE — Progress Notes (Signed)
 Error

## 2024-03-28 ENCOUNTER — Telehealth: Payer: Self-pay | Admitting: Cardiology

## 2024-03-28 NOTE — Telephone Encounter (Signed)
   Name: Sean [name].  DOB: 12/27/1940  MRN: [id number]   Primary Cardiologist: Antionette Kirks, MD  Chart reviewed as part of pre-operative protocol coverage. Mcarthur Bernasconi. was last seen on 03/18/2024 by Dr. Daneil Dunker.  An echo was ordered at that time. Dr. Alvenia Aus reviewed the echo "I reviewed the echo images. Even though the apex was abnormal, I do not see convincing evidence of an LV thrombus. Eliquis  can be held 2 days before and should be resumed as soon as safe post surgery."  Therefore, based on ACC/AHA guidelines, the patient would be an acceptable risk for the planned procedure without further cardiovascular testing.   I will route this recommendation to the requesting party via Epic fax function and remove from pre-op pool. Please call with questions.  Morey Ar, NP 03/28/2024, 4:37 PM

## 2024-03-28 NOTE — Telephone Encounter (Signed)
 Advised patient's wife that message has been sent to Dr. Daneil Dunker to review and I will call back with his recommendations.

## 2024-03-28 NOTE — Telephone Encounter (Signed)
 See pre-op clearance note.   Wenona Hamilton, MD to Morey Ar, NP  Cv Div Preop     03/28/24  4:24 PM Note Eliquis  indication for him is previous and not LV thrombus.  I reviewed the echo images.  Even though the apex was abnormal, I do not see convincing evidence of an LV thrombus.  Eliquis  can be held 2 days before and should be resumed as soon as safe post surgery.      Patient's wife has been made aware.

## 2024-03-28 NOTE — Telephone Encounter (Signed)
 Dr. Alvenia Aus,   This patient was last seen by Dr. Daneil Dunker on 4/29 for preoperative evaluation. He ordered an echo for risk stratification. He reviewed it today and stated "He has apical akinesis but for some reason no Definity was given and they say apex is poorly visualized in the report. In my opinion, if he is going to have his anti-coagulation held for surgery, then excluding presence of LV thrombus would be best. Can discuss with echo reader to see if they can exclude presence of thrombus with current images. Otherwise consider general cardiology for clearance."   Will you please review the echo and comment on medical clearance for enucleation of prostate scheduled for 5/16? Dr. Vonna Guardian manages his Eliquis .  Please route your response to P CV [facility] Preop. I will communicate with requesting office once you have given recommendations.   Thank you!  Sean Ar, NP

## 2024-03-28 NOTE — Telephone Encounter (Signed)
 Eliquis  indication for him is previous and not LV thrombus.  I reviewed the echo images.  Even though the apex was abnormal, I do not see convincing evidence of an LV thrombus.  Eliquis  can be held 2 days before and should be resumed as soon as safe post surgery.

## 2024-03-28 NOTE — Telephone Encounter (Signed)
 Patients wife is following up on patients echo. States that they are waiting on the echo results for the patient to be cleared for surgery next Friday. Wife is very anxious to get this clearance pushed through as they have heard from everyone except our office.

## 2024-04-01 ENCOUNTER — Ambulatory Visit (INDEPENDENT_AMBULATORY_CARE_PROVIDER_SITE_OTHER): Payer: HMO | Admitting: Vascular Surgery

## 2024-04-01 ENCOUNTER — Telehealth: Payer: Self-pay

## 2024-04-01 ENCOUNTER — Encounter: Payer: Self-pay | Admitting: Urology

## 2024-04-01 NOTE — Telephone Encounter (Signed)
 Spoke with patient and patient wife, we received clearance for him to hold his Eliquis  prior to surgery. Patient wife communicated understanding. Patient will hold Eliquis  starting today 04/01/2024.

## 2024-04-02 ENCOUNTER — Encounter: Payer: Self-pay | Admitting: Urology

## 2024-04-02 NOTE — Progress Notes (Signed)
 Perioperative / Anesthesia Services  Pre-Admission Testing Clinical Review / Pre-Operative Anesthesia Consult  Date: 04/03/24  PATIENT DEMOGRAPHICS: Name: Sean [name]. DOB: [birth year] MRN:   [id number]  Note: Available PAT nursing documentation and vital signs have been reviewed. Clinical nursing staff has updated patient's PMH/PSHx, current medication list, and drug allergies/intolerances to ensure complete and comprehensive history available to assist care teams in MDM as it pertains to the aforementioned surgical procedure and anticipated anesthetic course. Extensive review of available clinical information personally performed. [place] [facility] and PSHx updated with any diagnoses/procedures that  may have been inadvertently omitted during his intake with the pre-admission testing department's nursing staff.  PLANNED SURGICAL PROCEDURE(S):    Case: [id number] Date/Time: 04/04/24 0715   Procedure: ENUCLEATION, PROSTATE, USING LASER, WITH MORCELLATION   Anesthesia type: General   Diagnosis: Benign prostatic hyperplasia with urinary obstruction [N40.1, N13.8]   Pre-op diagnosis: Benign Prostatic Hyperplasia with Urinary Obstruction   Location: [facility] OR ROOM 10 / [facility]S FOR ANEST[facility]   Surgeons: Lawerence Pressman, MD     CLINICAL DISCUSSION: Available PAT nursing documentation and vital signs have been reviewed. Clinical nursing staff has updated patient's PMH/PSHx, current medication list, and drug allergies/intolerances to ensure complete and comprehensive history available to assist care teams in MDM as it pertains to the aforementioned surgical procedure and anticipated anesthetic course. Extensive review of available clinical information personally performed. [place] [facility] and PSHx updated with any diagnoses/procedures that  may have been inadvertently omitted during his intake with the pre-admission testing department's nursing staff.  Sean [name]. is a 83 y.o. male who  is submitted for pre-surgical anesthesia review and clearance prior to him undergoing the above procedure. Patient has never been a smoker in the past. Pertinent PMH includes: CAD (s/p CABG),  ischemic cardiomyopathy, HFrEF, paroxysmal atrial flutter, complete heart block (s/p PPM placement), aortic atherosclerosis, CVA, chronic cerebral microvascular disease, DVT/PE, PVCs, HTN, HLD, prediabetes, CKD-III, asthma, COPD, GERD (on daily PPI), anemia, OA, BPH with BOO.  Patient is followed by cardiology Alvenia Aus, MD). He was last seen in the cardiology clinic on 03/14/2024; notes reviewed. At the time of his clinic visit, patient doing well overall from a cardiovascular perspective. Patient denied any chest pain, shortness of breath, PND, orthopnea, palpitations, significant peripheral edema, weakness, fatigue, vertiginous symptoms, or presyncope/syncope. Patient with a past medical history significant for cardiovascular diagnoses. Documented physical exam was grossly benign, providing no evidence of acute exacerbation and/or decompensation of the patient's known cardiovascular conditions.  Patient with a history of symptomatic bradycardia.  He was found to be in complete heart block.  Patient ultimately underwent placement of a dual-chamber Medtronic Azure DR MRI (SN: [id number]) device on 06/11/2017.  Device is regularly interrogated by patient's primary electrophysiology team.  Last interrogation was on 03/18/2024 at which time device was noted to be functioning properly.  Coronary CTA was performed on 05/29/2018 that demonstrated an Agatston coronary artery calcium  score of 2404. This placed patient in the 91st percentile for age, sex, and race matched controls. Calcium  depositions noted to be isolated in the LM, LAD, LCx, and RCA distributions.  Study demonstrates normal coronary origin with RIGHT dominance.  Study was sent for CT FFR analysis (ranges: < 0.75 high likelihood of hemodynamically significant  stenosis, 0.76-0.80 borderline, > 0.80 normal):  Mid LCx = 0.59 Mid RCA = 0.56 Mid LAD = <0.5  Patient underwent diagnostic LEFT heart catheterization on 06/13/2018 revealing multivessel CAD; 80% ostial-proximal LCx, 99%  proximal/mid LAD, 90% ostial D1-D1, 30% ostial LAD, 50% proximal-mid LCx, 50% OM 2, and 30% ostial LM.  Left ventricular systolic function moderately decreased with an EF of 35-45%.  LVEDP elevated at 18 mmHg.  Given the degree and complexity of patient's diffuse coronary artery disease with associated cardiomyopathy, patient was referred to CVTS for consideration of revascularization procedure.  Patient underwent three-vessel revascularization on 08/26/2018.  LIMA-LAD, SVG-OM, and SVG-diagonal bypass grafts were placed.  Patient with a history of atrial flutter.  He underwent radiofrequency catheter ablation procedure on 03/03/2020 restoring NSR.  CT imaging of the head performed in 01/25/2024 made an incidental finding of a remote infarct involving the head of the RIGHT caudate and anterior limb of the RIGHT internal capsule.  Patient with no significant neurological deficits/late effects.  Most recent TTE performed on 03/25/2024 revealed a mildly reduced left ventricular systolic function with an EF of 40%.  There was mild LVH severe hypokinesis or akinesis of the apical anterior septum and apical inferior septum with relatively preserved anterior wall motion.  Severe mid basal inferior wall hypokinesis. Left ventricular diastolic Doppler parameters consistent with abnormal relaxation (G1DD).  There was mild biatrial dilatation right ventricular size normal with mildly reduced systolic function; TAPSE measuring 1.8 cm  (normal range >/= 1.6 cm).  There was mild calcification of the aortic valve. There was no significant valvular regurgitation. All transvalvular gradients were noted to be normal providing no evidence suggestive of valvular stenosis. Aorta normal in size with no  evidence of ectasia or aneurysmal dilatation.  Again, patient with an atrial fibrillation diagnosis; CHA2DS2-VASc Score = 7 (age x2, HFrEF, HTN, PE/DVT x 2, vascular disease ). Following his ablation, patient's rate and rhythm are maintained intrinsically.  Following a DVT, patient is on chronic oral anticoagulation therapy using apixaban ; reported to be compliant with therapy with no evidence or reports of GI/GU bleeding.  Blood pressure well controlled at 126/72 mmHg without the need pharmacological intervention. He is on atorvastatin  for his HLD diagnosis and further ASCVD prevention. Patient has a prediabetes diagnosis that he is managing with diet and lifestyle modifications alone. Last hemoglobin A1c was 6.5% when checked on 01/14/2024. Patient is able to complete all of his  ADL/IADLs without cardiovascular limitation.  Per the DASI, patient is able to achieve at least 4 METS of physical activity without experiencing any significant degree of angina/anginal equivalent symptoms.No changes were made to his medication regimen during his visit with cardiology.  Patient scheduled to follow-up with outpatient cardiology in 6 months or sooner if needed.  Sean [name]. is scheduled for an elective ENUCLEATION, PROSTATE, USING LASER, WITH MORCELLATION on 04/04/2024 with Dr. Jay Meth, MD.  Given patient's past medical history significant for cardiovascular diagnoses, presurgical cardiac clearance was sought by the PAT team. Per cardiology, "based ACC/AHA guidelines, the patient's past medical history, and the amount of time since his last clinic visit, this patient would be at an overall ACCEPTABLE risk for the planned procedure without further cardiovascular testing or intervention at this time".   Again, this patient is on daily oral anticoagulation therapy using a DOAC medication. He has been instructed on recommendations for holding his apixaban  for 2 days prior to his procedure with plans to restart  as soon as postoperative bleeding risk felt to be minimized by his primary attending surgeon. The patient has been instructed that his last dose of should be on 04/01/2024.  Patient denies previous perioperative complications with anesthesia in the past. In  review his EMR, it is noted that patient underwent a general anesthetic course at [facility] ([facility] in 02/2020 without documented complications.   MOST RECENT VITAL SIGNS:    03/18/2024    9:36 AM 02/20/2024    9:05 AM 02/02/2024    8:54 AM  Vitals with BMI  Height 5' 10.5"    Weight 209 lbs 3 oz    BMI 29.58    Systolic 126 124 409  Diastolic 72 78 68  Pulse 76 80 74   PROVIDERS/SPECIALISTS: NOTE: Primary physician provider listed below. Patient may have been seen by APP or partner within same practice.   PROVIDER ROLE / SPECIALTY LAST Sean Birks, MD Urology (Surgeon) 03/04/2024  Sean San, MD Primary Care Provider 02/11/2024  Sean Kirks, MD Cardiology 11/06/2023; preop APP call 03/28/2024  Sean Kohler, MD Electrophysiology 03/18/2024  Sean Chalet, MD Nephrology 02/01/2024  Sean Alexander, MD Vascular Surgery 12/04/2023   ALLERGIES: Allergies  Allergen Reactions   Azithromycin Hives   Erythromycin Hives   CURRENT HOME MEDICATIONS: No current facility-administered medications for this encounter.    acetaminophen  (TYLENOL ) 500 MG tablet   allopurinol  (ZYLOPRIM ) 100 MG tablet   apixaban  (ELIQUIS ) 5 MG TABS tablet   atorvastatin  (LIPITOR) 40 MG tablet   EPINEPHrine  (PRIMATENE  MIST) 0.125 MG/ACT AERO   ferrous sulfate  325 (65 FE) MG EC tablet   finasteride (PROSCAR) 5 MG tablet   loratadine  (CLARITIN ) 10 MG tablet   Multiple Vitamin (MULTIVITAMIN WITH MINERALS) TABS tablet   omeprazole (PRILOSEC) 20 MG capsule   psyllium (REGULOID) 0.52 g capsule   Nutritional Supplements (FEEDING SUPPLEMENT, NEPRO CARB STEADY,) LIQD   HISTORY: Past Medical History:  Diagnosis Date   Acute  encephalopathy 01/25/2024   Anemia    Aortic atherosclerosis (HCC)    Arthritis    Asthma    BPH with urinary obstruction    CAD (coronary artery disease)    a. LHC 7/19: ostLM 30%, ostLAD 30%, p-mLAD 99% mod calcified, ostD1 90%, ost-pLCx 80% ulcerative & mod calcified, p-mLCx 50%, OM2 50%, mod dz LPDA, RCA small w/ diffuse dz throughout; b. recommendation of CABG; c. 3-V CABG 08/26/18 (LIMA-LAD, VG-Diag, VG-LCx)   Cerebral microvascular disease    CKD (chronic kidney disease), stage III (HCC)    Complete heart block (HCC)    a.) symptomatic SB in setting of CHB --> dual chamber MDT PPM placed 06/11/2017   COPD (chronic obstructive pulmonary disease) (HCC)    CVA (cerebral vascular accident) (HCC)    a.) noted on CT head 01/25/2024: remote infarct involving the head of the right caudate and anterior limb of the right internal capsule   Deep vein thrombosis (DVT) of upper extremity (HCC) 06/17/2018   a.) LEFT subclavian --> Tx'd with apixaban    DVT, lower extremity, proximal, acute (HCC)    Dyslipidemia    GERD (gastroesophageal reflux disease)    Gout    Hemothorax on left    HFrEF (heart failure with reduced ejection fraction) (HCC)    History of pulmonary embolus (PE)    HOH (hard of hearing)    Hypertension    Ischemic cardiomyopathy    Loculated pleural effusion (LEFT)    a.) s/p VATS 09/24/2018   Multiple renal cysts    Obesity    On apixaban  therapy    Paroxysmal atrial flutter (HCC)    a.) CHA2DS2VASc = 7 (age x2, HFrEF, HTN, PE/DVT x 2, vascular disease) as of 04/01/2024; b.) s/p RFCA  03/03/2020; c.) rate/rhythm maintained intrinsically without pharmacological intervention; chronically anticoagulated with apixaban    Pneumonia 2000   Pre-diabetes    Presence of permanent cardiac pacemaker 06/11/2017   a.) dual chamber Medtronic Azure XT DR MRI [id number] (SN: [id number])   PVC's (premature ventricular contractions)    a. PPM-induced   S/P CABG x 3 08/26/2018   a.)  LIMA-LAD, SVG-OM, SVG-diagonal   Sepsis (HCC)    Past Surgical History:  Procedure Laterality Date   A-FLUTTER ABLATION N/A 03/03/2020   Procedure: A-FLUTTER ABLATION;  Surgeon: Verona Goodwill, MD;  Location: [facility] INVASIVE CV LAB;  Service: Cardiovascular;  Laterality: N/A;   BACK SURGERY     CORONARY ARTERY BYPASS GRAFT N/A 08/26/2018   Procedure: CORONARY ARTERY BYPASS GRAFTING (CABG) x3 , Using left internal mammory artery and right leg greater saphronious vein. Harvested endoscopically.;  Surgeon: Heriberto London, MD;  Location: [facility] OR;  Service: Open Heart Surgery;  Laterality: N/A;   EPICARDIAL PACING LEAD PLACEMENT N/A 08/26/2018   Procedure: LV PACING LEAD PLACEMENT;  Surgeon: Heriberto London, MD;  Location: [facility] OR;  Service: Thoracic;  Laterality: N/A;   EPICARDIAL PACING LEAD PLACEMENT Left 11/07/2018   Procedure: REVISE PACEMAKER LEAD LEFT CHEST;  Surgeon: Heriberto London, MD;  Location: [facility] OR;  Service: Thoracic;  Laterality: Left;   IVC FILTER INSERTION     LEFT HEART CATH AND CORONARY ANGIOGRAPHY N/A 06/13/2018   Procedure: LEFT HEART CATH AND CORONARY ANGIOGRAPHY;  Surgeon: Wenona Hamilton, MD;  Location: [facility] INVASIVE CV LAB;  Service: Cardiovascular;  Laterality: N/A;   LEG SURGERY     PACEMAKER IMPLANT N/A 06/11/2017   Procedure: Pacemaker Implant;  Surgeon: Tammie Fall, MD;  Location: [facility] INVASIVE CV LAB;  Service: Cardiovascular;  Laterality: N/A;   PERIPHERAL VASCULAR THROMBECTOMY Right 06/13/2023   Procedure: PERIPHERAL VASCULAR THROMBECTOMY;  Surgeon: Celso College, MD;  Location: [facility] INVASIVE CV LAB;  Service: Cardiovascular;  Laterality: Right;   PLEURAL EFFUSION DRAINAGE Left 09/24/2018   Procedure: DRAINAGE OF LOCULATED PLEURAL EFFUSION;  Surgeon: Heriberto London, MD;  Location: [facility] OR;  Service: Thoracic;  Laterality: Left;   TEE WITHOUT CARDIOVERSION N/A 08/26/2018   Procedure: TRANSESOPHAGEAL ECHOCARDIOGRAM (TEE);  Surgeon: Matt Song, Donata Fryer, MD;  Location:  [facility] OR;  Service: Open Heart Surgery;  Laterality: N/A;   TEE WITHOUT CARDIOVERSION  03/03/2020   Procedure: Transesophageal Echocardiogram (Tee);  Surgeon: Verona Goodwill, MD;  Location: [facility] INVASIVE CV LAB;  Service: Cardiovascular;;   VIDEO ASSISTED THORACOSCOPY Left 09/24/2018   Procedure: VIDEO ASSISTED THORACOSCOPY;  Surgeon: Matt Song, Donata Fryer, MD;  Location: [facility] OR;  Service: Thoracic;  Laterality: Left;   Family History  Problem Relation Age of Onset   Stroke Mother    Stroke Father    Alcohol abuse Father    Diabetes Brother    Alcohol abuse Brother    Stroke Maternal Grandfather    Social History   Tobacco Use   Smoking status: Never   Smokeless tobacco: Never  Substance Use Topics   Alcohol use: Yes    Comment: rare    LABS:  Lab Results  Component Value Date   WBC 16.9 (H) 02/02/2024   HGB 10.4 (L) 02/02/2024   HCT 30.9 (L) 02/02/2024   MCV 96.6 02/02/2024   PLT 243 02/02/2024   Lab Results  Component Value Date   NA 137 02/02/2024   CL 108 02/02/2024   K 4.4 02/02/2024   CO2 23 02/02/2024  BUN 52 (H) 02/02/2024   CREATININE 3.07 (H) 02/02/2024   GFRNONAA 20 (L) 02/02/2024   CALCIUM  7.7 (L) 02/02/2024   ALBUMIN  2.5 (L) 01/27/2024   GLUCOSE 127 (H) 02/02/2024    ECG: Date: 03/18/2024  Time ECG obtained: 0934 AM Rate: 76 bpm Rhythm: Atrial sensed ventricular paced rhythm Axis (leads I and aVF): normal Intervals: PR 154 ms. QRS 162 ms. QTc 488 ms. ST segment and T wave changes: No evidence of acute T wave abnormalities or significant ST segment elevation or depression.  Evidence of a possible, age undetermined, prior infarct:  No Comparison: Similar to previous tracing obtained on 01/25/2024   IMAGING / PROCEDURES: TRANSTHORACIC ECHOCARDIOGRAM performed on 03/25/2024 Definity contrast was not used. The Apical segments are not well defined. There is probably severe hypokinesis or akinesis of the apical anterior septum and apical inferior septum, but  with relatively preserved anterior wall motion. There is severe mid basal inferior wall hypokinesis. Lateral wall motion appears normal. Left ventricular ejection fraction, by estimation, is 40%. The left ventricle has mildly decreased function. The left ventricle demonstrates regional wall motion abnormalities. There is mild concentric left ventricular hypertrophy. Left ventricular diastolic parameters are consistent with Grade I diastolic dysfunction (impaired relaxation).  Right ventricular systolic function is mildly reduced. The right ventricular size is normal. Tricuspid regurgitation signal is inadequate for assessing PA pressure.  Left atrial size was mildly dilated.  Right atrial size was mildly dilated.  The mitral valve is normal in structure. No evidence of mitral valve regurgitation. No evidence of mitral stenosis.  The aortic valve is tricuspid. There is mild calcification of the aortic valve. Aortic valve regurgitation is not visualized. Aortic valve sclerosis/calcification is present, without any evidence of aortic stenosis.  The inferior vena cava is normal in size with greater than 50% respiratory variability, suggesting right atrial pressure of 3 mmHg.   US  RENAL performed on 01/26/2024 Right kidney: 11.9 x 6.6 x 5.9 cm = volume: 243.4 mL  Left kidney: 12.1 x 6.0 x 7.5 cm = volume: 282.5 mL  Moderate renal collecting system dilatation.  Separate renal cysts.  There is also a distended urinary bladder with wall thickening and trabeculation.  Recommend follow up imaging after emptying the bladder to see if the collecting system dilatation persists. Otherwise additional cross-sectional imaging study as clinically appropriate.  CT HEAD WO CONTRAST performed on 01/25/2024 No acute intracranial abnormality. 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 Chronic microvascular ischemic changes and  parenchymal volume loss.   IMPRESSION AND PLAN: Sean [name]. has been referred for pre-anesthesia review and clearance prior to him undergoing the planned anesthetic and procedural courses. Available labs, pertinent testing, and imaging results were personally reviewed by me in preparation for upcoming operative/procedural course. [facility] medical record has been updated following extensive record review and patient interview with PAT staff.   This patient has been appropriately cleared by cardiology with an overall ACCEPTABLE risk of patient experiencing significant perioperative cardiovascular complications. Completed perioperative prescription for cardiac device management documentation completed by primary cardiology team and placed on patient's chart for review by the surgical/anesthetic team on the day of his procedure. Electrophysiology indicating that procedure should not interfere with planned surgical procedure. Beyond normal perioperative cardiovascular monitoring, there are no recommendations from electrophysiology team that prompt further discussion/recommendations from industry representative.   Based on clinical review performed today (04/03/24), barring any significant  acute changes in the patient's overall condition, it is anticipated that he will be able to proceed with the planned surgical intervention. Any acute changes in clinical condition may necessitate his procedure being postponed and/or cancelled. Patient will meet with anesthesia team (MD and/or CRNA) on the day of his procedure for preoperative evaluation/assessment. Questions regarding anesthetic course will be fielded at that time.   Pre-surgical instructions were reviewed with the patient during his PAT appointment, and questions were fielded to satisfaction by PAT clinical staff. He has been instructed on which medications that he will need to hold prior to surgery, as well as the ones that have been deemed  safe/appropriate to take on the day of his procedure. As part of the general education provided by PAT, patient made aware both verbally and in writing, that he would need to abstain from the use of any illegal substances during his perioperative course. He was advised that failure to follow the provided instructions could necessitate case cancellation or result in serious perioperative complications up to and including death. Patient encouraged to contact PAT and/or his surgeon's office to discuss any questions or concerns that may arise prior to surgery; verbalized understanding.   Renate Caroline, MSN, APRN, FNP-C, CEN [facility]  Perioperative Services Nurse Practitioner Phone: [phone] Fax: [phone] 04/03/24 11:26 AM  NOTE: This note has been prepared using Dragon dictation software. Despite my best ability to proofread, there is always the potential that unintentional transcriptional errors may still occur from this process.

## 2024-04-04 ENCOUNTER — Ambulatory Visit
Admission: RE | Admit: 2024-04-04 | Discharge: 2024-04-04 | Disposition: A | Discharge status: Home / Self Care | Attending: Urology | Admitting: Urology

## 2024-04-04 ENCOUNTER — Encounter
Admission: RE | Disposition: A | Discharge status: Home / Self Care | Payer: Self-pay | Source: Home / Self Care | Attending: Urology

## 2024-04-04 ENCOUNTER — Encounter: Payer: Self-pay | Admitting: Urology

## 2024-04-04 ENCOUNTER — Ambulatory Visit: Payer: Self-pay | Admitting: Urgent Care

## 2024-04-04 ENCOUNTER — Other Ambulatory Visit: Payer: Self-pay

## 2024-04-04 DIAGNOSIS — R338 Other retention of urine: Secondary | ICD-10-CM | POA: Insufficient documentation

## 2024-04-04 DIAGNOSIS — N183 Chronic kidney disease, stage 3 unspecified: Secondary | ICD-10-CM | POA: Insufficient documentation

## 2024-04-04 DIAGNOSIS — I5022 Chronic systolic (congestive) heart failure: Secondary | ICD-10-CM | POA: Diagnosis not present

## 2024-04-04 DIAGNOSIS — Z951 Presence of aortocoronary bypass graft: Secondary | ICD-10-CM | POA: Diagnosis not present

## 2024-04-04 DIAGNOSIS — R7303 Prediabetes: Secondary | ICD-10-CM | POA: Diagnosis not present

## 2024-04-04 DIAGNOSIS — Z6829 Body mass index (BMI) 29.0-29.9, adult: Secondary | ICD-10-CM | POA: Insufficient documentation

## 2024-04-04 DIAGNOSIS — J449 Chronic obstructive pulmonary disease, unspecified: Secondary | ICD-10-CM | POA: Insufficient documentation

## 2024-04-04 DIAGNOSIS — I2510 Atherosclerotic heart disease of native coronary artery without angina pectoris: Secondary | ICD-10-CM | POA: Diagnosis not present

## 2024-04-04 DIAGNOSIS — E66813 Obesity, class 3: Secondary | ICD-10-CM | POA: Insufficient documentation

## 2024-04-04 DIAGNOSIS — I4891 Unspecified atrial fibrillation: Secondary | ICD-10-CM | POA: Insufficient documentation

## 2024-04-04 DIAGNOSIS — Z86718 Personal history of other venous thrombosis and embolism: Secondary | ICD-10-CM | POA: Diagnosis not present

## 2024-04-04 DIAGNOSIS — C61 Malignant neoplasm of prostate: Secondary | ICD-10-CM | POA: Diagnosis not present

## 2024-04-04 DIAGNOSIS — I130 Hypertensive heart and chronic kidney disease with heart failure and stage 1 through stage 4 chronic kidney disease, or unspecified chronic kidney disease: Secondary | ICD-10-CM | POA: Insufficient documentation

## 2024-04-04 DIAGNOSIS — N401 Enlarged prostate with lower urinary tract symptoms: Secondary | ICD-10-CM | POA: Insufficient documentation

## 2024-04-04 DIAGNOSIS — Z950 Presence of cardiac pacemaker: Secondary | ICD-10-CM | POA: Diagnosis not present

## 2024-04-04 DIAGNOSIS — K219 Gastro-esophageal reflux disease without esophagitis: Secondary | ICD-10-CM | POA: Diagnosis not present

## 2024-04-04 DIAGNOSIS — N138 Other obstructive and reflux uropathy: Secondary | ICD-10-CM | POA: Diagnosis present

## 2024-04-04 HISTORY — DX: Atherosclerosis of aorta: I70.0

## 2024-04-04 HISTORY — DX: Long term (current) use of anticoagulants: Z79.01

## 2024-04-04 HISTORY — DX: Other cerebrovascular disease: I67.89

## 2024-04-04 HISTORY — DX: Pleural effusion, not elsewhere classified: J90

## 2024-04-04 HISTORY — DX: Unspecified systolic (congestive) heart failure: I50.20

## 2024-04-04 HISTORY — DX: Cerebral infarction, unspecified: I63.9

## 2024-04-04 HISTORY — PX: HOLEP-LASER ENUCLEATION OF THE PROSTATE WITH MORCELLATION: SHX6641

## 2024-04-04 HISTORY — DX: Atrioventricular block, complete: I44.2

## 2024-04-04 SURGERY — ENUCLEATION, PROSTATE, USING LASER, WITH MORCELLATION
Anesthesia: General

## 2024-04-04 MED ORDER — LIDOCAINE HCL (PF) 2 % IJ SOLN
INTRAMUSCULAR | Status: AC
  Filled 2024-04-04: qty 15

## 2024-04-04 MED ORDER — LIDOCAINE HCL (CARDIAC) PF 100 MG/5ML IV SOSY
PREFILLED_SYRINGE | INTRAVENOUS | Status: DC | PRN
  Administered 2024-04-04: 60 mg via INTRAVENOUS

## 2024-04-04 MED ORDER — DEXAMETHASONE SODIUM PHOSPHATE 10 MG/ML IJ SOLN
INTRAMUSCULAR | Status: AC
  Filled 2024-04-04: qty 1

## 2024-04-04 MED ORDER — LACTATED RINGERS IV SOLN: INTRAVENOUS | Status: DC | PRN

## 2024-04-04 MED ORDER — CEFAZOLIN SODIUM-DEXTROSE 2-4 GM/100ML-% IV SOLN
2.0000 g | INTRAVENOUS | Status: AC
  Administered 2024-04-04: 2 g via INTRAVENOUS

## 2024-04-04 MED ORDER — PHENYLEPHRINE HCL-NACL 20-0.9 MG/250ML-% IV SOLN
INTRAVENOUS | Status: AC
  Filled 2024-04-04: qty 250

## 2024-04-04 MED ORDER — SODIUM CHLORIDE 0.9 % IR SOLN
Status: DC | PRN
  Administered 2024-04-04: 12000 mL via INTRAVESICAL

## 2024-04-04 MED ORDER — SUGAMMADEX SODIUM 200 MG/2ML IV SOLN
INTRAVENOUS | Status: DC | PRN
  Administered 2024-04-04: 200 mg via INTRAVENOUS

## 2024-04-04 MED ORDER — OXYCODONE HCL 5 MG/5ML PO SOLN: 5.0000 mg | Freq: Once | ORAL | Status: DC | PRN

## 2024-04-04 MED ORDER — ROCURONIUM BROMIDE 100 MG/10ML IV SOLN
INTRAVENOUS | Status: DC | PRN
  Administered 2024-04-04: 40 mg via INTRAVENOUS
  Administered 2024-04-04: 10 mg via INTRAVENOUS

## 2024-04-04 MED ORDER — ALBUMIN HUMAN 5 % IV SOLN
INTRAVENOUS | Status: AC
  Filled 2024-04-04: qty 250

## 2024-04-04 MED ORDER — CEFAZOLIN SODIUM-DEXTROSE 2-4 GM/100ML-% IV SOLN
INTRAVENOUS | Status: AC
  Filled 2024-04-04: qty 100

## 2024-04-04 MED ORDER — CHLORHEXIDINE GLUCONATE 0.12 % MT SOLN
OROMUCOSAL | Status: AC
  Filled 2024-04-04: qty 15

## 2024-04-04 MED ORDER — CHLORHEXIDINE GLUCONATE 0.12 % MT SOLN
15.0000 mL | Freq: Once | OROMUCOSAL | Status: AC
  Administered 2024-04-04: 15 mL via OROMUCOSAL

## 2024-04-04 MED ORDER — OXYCODONE HCL 5 MG PO TABS: 5.0000 mg | ORAL_TABLET | Freq: Once | ORAL | Status: DC | PRN

## 2024-04-04 MED ORDER — PROPOFOL 10 MG/ML IV BOLUS
INTRAVENOUS | Status: AC
  Filled 2024-04-04: qty 40

## 2024-04-04 MED ORDER — DEXAMETHASONE SODIUM PHOSPHATE 10 MG/ML IJ SOLN
INTRAMUSCULAR | Status: DC | PRN
  Administered 2024-04-04: 4 mg via INTRAVENOUS

## 2024-04-04 MED ORDER — ONDANSETRON HCL 4 MG/2ML IJ SOLN
INTRAMUSCULAR | Status: AC
  Filled 2024-04-04: qty 2

## 2024-04-04 MED ORDER — FENTANYL CITRATE (PF) 100 MCG/2ML IJ SOLN
INTRAMUSCULAR | Status: AC
Start: 2024-04-04 — End: ?
  Filled 2024-04-04: qty 2

## 2024-04-04 MED ORDER — ONDANSETRON HCL 4 MG/2ML IJ SOLN
INTRAMUSCULAR | Status: DC | PRN
  Administered 2024-04-04: 4 mg via INTRAVENOUS

## 2024-04-04 MED ORDER — LACTATED RINGERS IV SOLN: INTRAVENOUS | Status: DC

## 2024-04-04 MED ORDER — FENTANYL CITRATE (PF) 100 MCG/2ML IJ SOLN
INTRAMUSCULAR | Status: DC | PRN
Start: 2024-04-04 — End: 2024-04-04
  Administered 2024-04-04: 25 ug via INTRAVENOUS
  Administered 2024-04-04: 50 ug via INTRAVENOUS
  Administered 2024-04-04: 25 ug via INTRAVENOUS

## 2024-04-04 MED ORDER — PROPOFOL 10 MG/ML IV BOLUS
INTRAVENOUS | Status: DC | PRN
Start: 2024-04-04 — End: 2024-04-04
  Administered 2024-04-04: 100 mg via INTRAVENOUS

## 2024-04-04 MED ORDER — ROCURONIUM BROMIDE 10 MG/ML (PF) SYRINGE
PREFILLED_SYRINGE | INTRAVENOUS | Status: AC
  Filled 2024-04-04: qty 10

## 2024-04-04 MED ORDER — FENTANYL CITRATE (PF) 100 MCG/2ML IJ SOLN: 25.0000 ug | INTRAMUSCULAR | Status: DC | PRN

## 2024-04-04 MED ORDER — ORAL CARE MOUTH RINSE: 15.0000 mL | Freq: Once | OROMUCOSAL | Status: AC

## 2024-04-04 SURGICAL SUPPLY — 25 items
ADAPTER IRRIG TUBE 2 SPIKE SOL (ADAPTER) ×2 IMPLANT
BAG URO DRAIN 4000ML (MISCELLANEOUS) ×1 IMPLANT
CATH URETL OPEN END 4X70 (CATHETERS) ×1 IMPLANT
CATH URTH STD 24FR FL 3W 2 (CATHETERS) ×1 IMPLANT
CONTAINER COLLECT MORCELLATR (MISCELLANEOUS) ×1 IMPLANT
DRAPE UTILITY 15X26 TOWEL STRL (DRAPES) IMPLANT
ELECTRD BIVAP BIPO 22/24 DONUT (ELECTROSURGICAL) IMPLANT
FIBER LASER MOSES 550 DFL (Laser) ×1 IMPLANT
FILTER OVERFLOW MORCELLATOR (FILTER) ×1 IMPLANT
GLOVE BIOGEL PI IND STRL 7.5 (GLOVE) ×1 IMPLANT
GOWN STRL REUS W/ TWL LRG LVL3 (GOWN DISPOSABLE) ×1 IMPLANT
GOWN STRL REUS W/ TWL XL LVL3 (GOWN DISPOSABLE) ×1 IMPLANT
HOLDER FOLEY CATH W/STRAP (MISCELLANEOUS) ×1 IMPLANT
KIT TURNOVER CYSTO (KITS) ×1 IMPLANT
MEMBRANE SLNG YLW 17 FOR INST (MISCELLANEOUS) ×1 IMPLANT
MORCELLATOR ROTATION 4.75 335 (MISCELLANEOUS) ×1 IMPLANT
PACK CYSTO AR (MISCELLANEOUS) ×1 IMPLANT
SET CYSTO W/LG BORE CLAMP LF (SET/KITS/TRAYS/PACK) ×1 IMPLANT
SET IRRIG Y TYPE TUR BLADDER L (SET/KITS/TRAYS/PACK) ×1 IMPLANT
SLEEVE PROTECTION STRL DISP (MISCELLANEOUS) ×2 IMPLANT
SOL .9 NS 3000ML IRR UROMATIC (IV SOLUTION) ×5 IMPLANT
SURGILUBE 2OZ TUBE FLIPTOP (MISCELLANEOUS) ×1 IMPLANT
SYRINGE TOOMEY IRRIG 70ML (MISCELLANEOUS) ×1 IMPLANT
TUBE PUMP MORCELLATOR PIRANHA (TUBING) ×1 IMPLANT
WATER STERILE IRR 1000ML POUR (IV SOLUTION) ×1 IMPLANT

## 2024-04-04 NOTE — Op Note (Signed)
 Date of procedure: 04/04/24  Preoperative diagnosis:  BPH with retention  Postoperative diagnosis:  Same  Procedure: HoLEP (Holmium Laser Enucleation of the Prostate)  Surgeon: Jay Meth, MD  Anesthesia: General  Complications: None  Intraoperative findings:  Small prostate with obstructing lateral lobes, moderate to severe bladder trabeculations, no suspicious lesions, ureteral orifices orthotopic bilaterally, well away from bladder neck Uncomplicated HOLEP, excellent hemostasis, ureteral orifices and verumontanum intact at conclusion of case  EBL: Minimal  Specimens: Prostate chips  Enucleation time: 12 minutes  Morcellation time: 8 minutes  Intra-op weight: 60g*  Drains: 24 French three-way, 60 cc in balloon  Indication: Sean [name]. is a 83 y.o. patient with BPH and Foley dependent urinary retention and failed multiple voiding trials and opted for HOLEP to try to resume spontaneous voiding.  After reviewing the management options for treatment, they elected to proceed with the above surgical procedure(s). We have discussed the potential benefits and risks of the procedure, side effects of the proposed treatment, the likelihood of the patient achieving the goals of the procedure, and any potential problems that might occur during the procedure or recuperation.  We specifically discussed the risks of bleeding, infection, hematuria and clot retention, need for additional procedures, possible overnight hospital stay, temporary urgency and incontinence, rare long-term incontinence, and retrograde ejaculation.  Informed consent has been obtained.   Description of procedure:  The patient was taken to the operating room and general anesthesia was induced.  The patient was placed in the dorsal lithotomy position, prepped and draped in the usual sterile fashion, and preoperative antibiotics were administered.  SCDs were placed for DVT prophylaxis.  A preoperative time-out was  performed.   Sean [name] sounds were used to gently dilated the urethra up to 54F. The 71 French continuous flow resectoscope was inserted into the urethra using the visual obturator  The prostate was short with obstructing lateral lobes. The bladder was thoroughly inspected and notable for moderate to severe trabeculations, mild catheter cystitis at the posterior wall, no suspicious lesions.  The ureteral orifices were located in orthotopic position, and well away from the bladder neck.    The laser was set to 2 J and 60 Hz and early apical release was performed by making a circumferential mucosal incision proximal to the sphincter.  A lambda incision was then made proximal to the verumontanum.  The prostate was enucleated en bloc circumferentially into the bladder.  The capsule was examined and laser was used for meticulous hemostasis.    The 50 French resectoscope was then switched out for the 26 French nephroscope and prostate tissue was morcellated(Piranha) and the tissue sent to pathology.  A 24 French three-way catheter was inserted easily with the aid of a catheter guide, and 60 cc were placed in the balloon.  Urine was pink.  The catheter irrigated easily with a Toomey syringe.  CBI was initiated.   The patient tolerated the procedure well without any immediate complications and was extubated and transferred to the recovery room in stable condition.  Urine was light pink on fast CBI.  Disposition: Stable to PACU  Plan: Wean CBI in PACU, anticipate discharge home today with Foley removal in clinic in 2-3 days  Jay Meth, MD 04/04/2024

## 2024-04-04 NOTE — Anesthesia Procedure Notes (Signed)
 Procedure Name: Intubation Date/Time: 04/04/2024 7:40 AM  Performed by: Philippe Brazen, CRNAPre-anesthesia Checklist: Patient identified, Emergency Drugs available, Suction available and Patient being monitored Patient Re-evaluated:Patient Re-evaluated prior to induction Oxygen Delivery Method: Circle system utilized Preoxygenation: Pre-oxygenation with 100% oxygen Induction Type: IV induction Ventilation: Mask ventilation without difficulty Grade View: Grade I Tube type: Oral Tube size: 7.5 mm Number of attempts: 1 Airway Equipment and Method: Stylet and Oral airway Placement Confirmation: ETT inserted through vocal cords under direct vision, positive ETCO2 and breath sounds checked- equal and bilateral Secured at: 22 cm Tube secured with: Tape Dental Injury: Teeth and Oropharynx as per pre-operative assessment

## 2024-04-04 NOTE — Transfer of Care (Signed)
 Immediate Anesthesia Transfer of Care Note  Patient: Sean [name].  Procedure(s) Performed: ENUCLEATION, PROSTATE, USING LASER, WITH MORCELLATION  Patient Location: PACU  Anesthesia Type:General  Level of Consciousness: drowsy and patient cooperative  Airway & Oxygen Therapy: Patient Spontanous Breathing  Post-op Assessment: Report given to RN and Post -op Vital signs reviewed and stable  Post vital signs: Reviewed and stable  Last Vitals:  Vitals Value Taken Time  BP 185/90 04/04/24 0833  Temp    Pulse 80 04/04/24 0835  Resp 15 04/04/24 0835  SpO2 100 % 04/04/24 0835  Vitals shown include unfiled device data.  Last Pain:  Vitals:   04/04/24 0641  TempSrc: Temporal  PainSc: 0-No pain         Complications: No notable events documented.

## 2024-04-04 NOTE — Anesthesia Postprocedure Evaluation (Signed)
 Anesthesia Post Note  Patient: Sean [name].  Procedure(s) Performed: ENUCLEATION, PROSTATE, USING LASER, WITH MORCELLATION  Patient location during evaluation: PACU Anesthesia Type: General Level of consciousness: awake and alert Pain management: pain level controlled Vital Signs Assessment: post-procedure vital signs reviewed and stable Respiratory status: spontaneous breathing, nonlabored ventilation, respiratory function stable and patient connected to nasal cannula oxygen Cardiovascular status: blood pressure returned to baseline and stable Postop Assessment: no apparent nausea or vomiting Anesthetic complications: no  No notable events documented.   Last Vitals:  Vitals:   04/04/24 0932 04/04/24 0935  BP:  (!) 171/81  Pulse: 89   Resp: 20   Temp: (!) 36.1 C   SpO2: 99%     Last Pain:  Vitals:   04/04/24 0932  TempSrc: Temporal  PainSc: 0-No pain                 Enrique Harvest

## 2024-04-04 NOTE — Anesthesia Preprocedure Evaluation (Addendum)
 Anesthesia Evaluation  Patient identified by MRN, date of birth, ID band Patient awake    Reviewed: Allergy & Precautions, NPO status , Patient's Chart, lab work & pertinent test results, reviewed documented beta blocker date and time   History of Anesthesia Complications Negative for: history of anesthetic complications  Airway Mallampati: II  TM Distance: >3 FB Neck ROM: Full    Dental  (+) Teeth Intact, Dental Advisory Given   Pulmonary shortness of breath, asthma    breath sounds clear to auscultation       Cardiovascular hypertension, Pt. on medications and Pt. on home beta blockers (-) angina + CAD, + CABG and +CHF  + dysrhythmias Atrial Fibrillation + pacemaker  Rhythm:Regular     Neuro/Psych CVA  negative psych ROS   GI/Hepatic Neg liver ROS,GERD  ,,  Endo/Other    Class 3 obesity  Renal/GU      Musculoskeletal   Abdominal   Peds  Hematology  (+) Blood dyscrasia, anemia   Anesthesia Other Findings Note per Renate Caroline:  Available PAT nursing documentation and vital signs have been reviewed. Clinical nursing staff has updated patient's PMH/PSHx, current medication list, and drug allergies/intolerances to ensure complete and comprehensive history available to assist care teams in MDM as it pertains to the aforementioned surgical procedure and anticipated anesthetic course. Extensive review of available clinical information personally performed. [facility] PMH and PSHx updated with any diagnoses/procedures that  may have been inadvertently omitted during his intake with the pre-admission testing department's nursing staff.   Kee Depa. is a 83 y.o. male who is submitted for pre-surgical anesthesia review and clearance prior to him undergoing the above procedure. Patient has never been a smoker in the past. Pertinent PMH includes: CAD (s/p CABG),  ischemic cardiomyopathy, HFrEF, paroxysmal atrial flutter,  complete heart block (s/p PPM placement), aortic atherosclerosis, CVA, chronic cerebral microvascular disease, DVT/PE, PVCs, HTN, HLD, prediabetes, CKD-III, asthma, COPD, GERD (on daily PPI), anemia, OA, BPH with BOO.   Patient is followed by cardiology Alvenia Aus, MD). He was last seen in the cardiology clinic on 03/14/2024; notes reviewed. At the time of his clinic visit, patient doing well overall from a cardiovascular perspective. Patient denied any chest pain, shortness of breath, PND, orthopnea, palpitations, significant peripheral edema, weakness, fatigue, vertiginous symptoms, or presyncope/syncope. Patient with a past medical history significant for cardiovascular diagnoses. Documented physical exam was grossly benign, providing no evidence of acute exacerbation and/or decompensation of the patient's known cardiovascular conditions.    Patient with a history of symptomatic bradycardia.  He was found to be in complete heart block.  Patient ultimately underwent placement of a dual-chamber Medtronic Azure DR MRI (SN: [id number]) device on 06/11/2017.  Device is regularly interrogated by patient's primary electrophysiology team.  Last interrogation was on 03/18/2024 at which time device was noted to be functioning properly.    Coronary CTA was performed on 05/29/2018 that demonstrated an Agatston coronary artery calcium  score of 2404. This placed patient in the 91st percentile for age, sex, and race matched controls. Calcium  depositions noted to be isolated in the LM, LAD, LCx, and RCA distributions.  Study demonstrates normal coronary origin with RIGHT dominance.  Study was sent for CT FFR analysis (ranges: < 0.75 high likelihood of hemodynamically significant stenosis, 0.76-0.80 borderline, > 0.80 normal):    Mid LCx = 0.59  Mid RCA = 0.56  Mid LAD = <0.5    Patient underwent diagnostic LEFT heart catheterization on 06/13/2018 revealing multivessel CAD;  80% ostial-proximal LCx, 99% proximal/mid  LAD, 90% ostial D1-D1, 30% ostial LAD, 50% proximal-mid LCx, 50% OM 2, and 30% ostial LM.  Left ventricular systolic function moderately decreased with an EF of 35-45%.  LVEDP elevated at 18 mmHg.  Given the degree and complexity of patient's diffuse coronary artery disease with associated cardiomyopathy, patient was referred to CVTS for consideration of revascularization procedure.    Patient underwent three-vessel revascularization on 08/26/2018.  LIMA-LAD, SVG-OM, and SVG-diagonal bypass grafts were placed.    Patient with a history of atrial flutter.  He underwent radiofrequency catheter ablation procedure on 03/03/2020 restoring NSR.    CT imaging of the head performed in 01/25/2024 made an incidental finding of a remote infarct involving the head of the RIGHT caudate and anterior limb of the RIGHT internal capsule.  Patient with no significant neurological deficits/late effects.    Most recent TTE performed on 03/25/2024 revealed a mildly reduced left ventricular systolic function with an EF of 40%.  There was mild LVH severe hypokinesis or akinesis of the apical anterior septum and apical inferior septum with relatively preserved anterior wall motion.  Severe mid basal inferior wall hypokinesis. Left ventricular diastolic Doppler parameters consistent with abnormal relaxation (G1DD).  There was mild biatrial dilatation right ventricular size normal with mildly reduced systolic function; TAPSE measuring 1.8 cm  (normal range >/= 1.6 cm).  There was mild calcification of the aortic valve. There was no significant valvular regurgitation. All transvalvular gradients were noted to be normal providing no evidence suggestive of valvular stenosis. Aorta normal in size with no evidence of ectasia or aneurysmal dilatation.   Again, patient with an atrial fibrillation diagnosis; CHA2DS2-VASc Score = 7 (age x2, HFrEF, HTN, PE/DVT x 2, vascular disease ). Following his ablation, patient's rate and rhythm are  maintained intrinsically.  Following a DVT, patient is on chronic oral anticoagulation therapy using apixaban ; reported to be compliant with therapy with no evidence or reports of GI/GU bleeding.  Blood pressure well controlled at 126/72 mmHg without the need pharmacological intervention. He is on atorvastatin  for his HLD diagnosis and further ASCVD prevention. Patient has a prediabetes diagnosis that he is managing with diet and lifestyle modifications alone. Last hemoglobin A1c was 6.5% when checked on 01/14/2024. Patient is able to complete all of his  ADL/IADLs without cardiovascular limitation.  Per the DASI, patient is able to achieve at least 4 METS of physical activity without experiencing any significant degree of angina/anginal equivalent symptoms.No changes were made to his medication regimen during his visit with cardiology.  Patient scheduled to follow-up with outpatient cardiology in 6 months or sooner if needed.   Felicity Horseman. is scheduled for an elective ENUCLEATION, PROSTATE, USING LASER, WITH MORCELLATION on 04/04/2024 with Dr. Jay Meth, MD.  Given patient's past medical history significant for cardiovascular diagnoses, presurgical cardiac clearance was sought by the PAT team. Per cardiology, "based ACC/AHA guidelines, the patient's past medical history, and the amount of time since his last clinic visit, this patient would be at an overall ACCEPTABLE risk for the planned procedure without further cardiovascular testing or intervention at this time".    Again, this patient is on daily oral anticoagulation therapy using a DOAC medication. He has been instructed on recommendations for holding his apixaban  for 2 days prior to his procedure with plans to restart as soon as postoperative bleeding risk felt to be minimized by his primary attending surgeon. The patient has been instructed that his last dose of should  be on 04/01/2024.   Reproductive/Obstetrics                              Anesthesia Physical Anesthesia Plan  ASA: 3  Anesthesia Plan: General ETT   Post-op Pain Management:    Induction: Intravenous  PONV Risk Score and Plan: 2 and Ondansetron  and Dexamethasone   Airway Management Planned: Oral ETT  Additional Equipment:   Intra-op Plan:   Post-operative Plan: Extubation in OR  Informed Consent: I have reviewed the patients History and Physical, chart, labs and discussed the procedure including the risks, benefits and alternatives for the proposed anesthesia with the patient or authorized representative who has indicated his/her understanding and acceptance.     Dental Advisory Given  Plan Discussed with: Anesthesiologist, CRNA and Surgeon  Anesthesia Plan Comments: (Patient consented for risks of anesthesia including but not limited to:  - adverse reactions to medications - damage to eyes, teeth, lips or other oral mucosa - nerve damage due to positioning  - sore throat or hoarseness - Damage to heart, brain, nerves, lungs, other parts of body or loss of life  Patient voiced understanding and assent.)       Anesthesia Quick Evaluation

## 2024-04-04 NOTE — H&P (Signed)
 04/04/24 7:02 AM   Sean [name]. 1940-12-09 [id number]  CC: Urinary retention  HPI: Extremely comorbid 83 year old male with Foley dependent urinary retention, failed multiple voiding trials, family opted for HOLEP to try to resume spontaneous voiding.   PMH: Past Medical History:  Diagnosis Date   Acute encephalopathy 01/25/2024   Anemia    Aortic atherosclerosis (HCC)    Arthritis    Asthma    BPH with urinary obstruction    CAD (coronary artery disease)    a. LHC 7/19: ostLM 30%, ostLAD 30%, p-mLAD 99% mod calcified, ostD1 90%, ost-pLCx 80% ulcerative & mod calcified, p-mLCx 50%, OM2 50%, mod dz LPDA, RCA small w/ diffuse dz throughout; b. recommendation of CABG; c. 3-V CABG 08/26/18 (LIMA-LAD, VG-Diag, VG-LCx)   Cerebral microvascular disease    CKD (chronic kidney disease), stage III (HCC)    Complete heart block (HCC)    a.) symptomatic SB in setting of CHB --> dual chamber MDT PPM placed 06/11/2017   COPD (chronic obstructive pulmonary disease) (HCC)    CVA (cerebral vascular accident) (HCC)    a.) noted on CT head 01/25/2024: remote infarct involving the head of the right caudate and anterior limb of the right internal capsule   Deep vein thrombosis (DVT) of upper extremity (HCC) 06/17/2018   a.) LEFT subclavian --> Tx'd with apixaban    DVT, lower extremity, proximal, acute (HCC)    Dyslipidemia    GERD (gastroesophageal reflux disease)    Gout    Hemothorax on left    HFrEF (heart failure with reduced ejection fraction) (HCC)    History of pulmonary embolus (PE)    HOH (hard of hearing)    Hypertension    Ischemic cardiomyopathy    Loculated pleural effusion (LEFT)    a.) s/p VATS 09/24/2018   Multiple renal cysts    Obesity    On apixaban  therapy    Paroxysmal atrial flutter (HCC)    a.) CHA2DS2VASc = 7 (age x2, HFrEF, HTN, PE/DVT x 2, vascular disease) as of 04/01/2024; b.) s/p RFCA 03/03/2020; c.) rate/rhythm maintained intrinsically without  pharmacological intervention; chronically anticoagulated with apixaban    Pneumonia 2000   Pre-diabetes    Presence of permanent cardiac pacemaker 06/11/2017   a.) dual chamber Medtronic Azure XT DR MRI [id number] (SN: [id number])   PVC's (premature ventricular contractions)    a. PPM-induced   S/P CABG x 3 08/26/2018   a.) LIMA-LAD, SVG-OM, SVG-diagonal   Sepsis (HCC)     Surgical History: Past Surgical History:  Procedure Laterality Date   A-FLUTTER ABLATION N/A 03/03/2020   Procedure: A-FLUTTER ABLATION;  Surgeon: Verona Goodwill, MD;  Location: [facility] INVASIVE CV LAB;  Service: Cardiovascular;  Laterality: N/A;   CORONARY ARTERY BYPASS GRAFT N/A 08/26/2018   Procedure: CORONARY ARTERY BYPASS GRAFTING (CABG) x3 , Using left internal mammory artery and right leg greater saphronious vein. Harvested endoscopically.;  Surgeon: Heriberto London, MD;  Location: [facility] OR;  Service: Open Heart Surgery;  Laterality: N/A;   EPICARDIAL PACING LEAD PLACEMENT N/A 08/26/2018   Procedure: LV PACING LEAD PLACEMENT;  Surgeon: Heriberto London, MD;  Location: [facility] OR;  Service: Thoracic;  Laterality: N/A;   EPICARDIAL PACING LEAD PLACEMENT Left 11/07/2018   Procedure: REVISE PACEMAKER LEAD LEFT CHEST;  Surgeon: Heriberto London, MD;  Location: [facility] OR;  Service: Thoracic;  Laterality: Left;   IVC FILTER INSERTION     LEFT HEART CATH AND CORONARY ANGIOGRAPHY N/A 06/13/2018   Procedure: LEFT  HEART CATH AND CORONARY ANGIOGRAPHY;  Surgeon: Wenona Hamilton, MD;  Location: [facility] INVASIVE CV LAB;  Service: Cardiovascular;  Laterality: N/A;   LEG SURGERY     LUMBAR DISC SURGERY     PACEMAKER IMPLANT N/A 06/11/2017   Procedure: Pacemaker Implant;  Surgeon: Tammie Fall, MD;  Location: [facility] INVASIVE CV LAB;  Service: Cardiovascular;  Laterality: N/A;   PERIPHERAL VASCULAR THROMBECTOMY Right 06/13/2023   Procedure: PERIPHERAL VASCULAR THROMBECTOMY;  Surgeon: Celso College, MD;  Location: [facility] INVASIVE CV LAB;  Service:  Cardiovascular;  Laterality: Right;   PLEURAL EFFUSION DRAINAGE Left 09/24/2018   Procedure: DRAINAGE OF LOCULATED PLEURAL EFFUSION;  Surgeon: Heriberto London, MD;  Location: [facility] OR;  Service: Thoracic;  Laterality: Left;   TEE WITHOUT CARDIOVERSION N/A 08/26/2018   Procedure: TRANSESOPHAGEAL ECHOCARDIOGRAM (TEE);  Surgeon: Matt Song, Donata Fryer, MD;  Location: [facility] OR;  Service: Open Heart Surgery;  Laterality: N/A;   TEE WITHOUT CARDIOVERSION  03/03/2020   Procedure: Transesophageal Echocardiogram (Tee);  Surgeon: Verona Goodwill, MD;  Location: [facility] INVASIVE CV LAB;  Service: Cardiovascular;;   VIDEO ASSISTED THORACOSCOPY Left 09/24/2018   Procedure: VIDEO ASSISTED THORACOSCOPY;  Surgeon: Matt Song, Donata Fryer, MD;  Location: [facility] OR;  Service: Thoracic;  Laterality: Left;      Family History: Family History  Problem Relation Age of Onset   Stroke Mother    Stroke Father    Alcohol abuse Father    Diabetes Brother    Alcohol abuse Brother    Stroke Maternal Grandfather     Social History:  reports that he has never smoked. He has never used smokeless tobacco. He reports current alcohol use. He reports that he does not use drugs.  Physical Exam: BP (!) 165/88   Pulse 70   Temp (!) 97 F (36.1 C) (Temporal)   Resp 16   Ht 5' 10.5" (1.791 m)   Wt 94.8 kg   SpO2 96%   BMI 29.56 kg/m    Constitutional:  Alert and oriented, No acute distress. Cardiovascular: Regular rate and rhythm Respiratory: Clear to auscultation bilaterally GI: Abdomen is soft, nontender, nondistended, no abdominal masses   Laboratory Data: Urine culture 4/15 no growth   Assessment & Plan:   Extremely comorbid and frail 83 year old male with Foley dependent urinary retention felt to be related to BPH, has failed multiple voiding trials despite maximal medical therapy.  Family has opted to pursue HOLEP to try to resume spontaneous voiding.  We discussed the risks and benefits of HoLEP at length.  The procedure  requires general anesthesia and takes 1 to 2 hours, and a holmium laser is used to enucleate the prostate and push this tissue into the bladder.  A morcellator is then used to remove this tissue, which is sent for pathology.  The vast majority(>95%) of patients are able to discharge the same day with a catheter in place for 2 to 3 days, and will follow-up in clinic for a voiding trial.  We specifically discussed the risks of bleeding, infection, retrograde ejaculation, temporary urgency and urge incontinence, very low risk of long-term incontinence, urethral stricture/bladder neck contracture, pathologic evaluation of prostate tissue and possible detection of prostate cancer or other malignancy, and possible need for additional procedures.  Family understands he has multiple comorbidities including CKD, CAD, history of PE, understands higher risk of surgery, but they are willing to accept risk to try to resume spontaneous voiding and get him catheter free.  HOLEP today  Jay Meth,  MD 04/04/2024  [facility] [address] [place], [place] [address] [phone]

## 2024-04-05 ENCOUNTER — Encounter: Payer: Self-pay | Admitting: Urology

## 2024-04-07 ENCOUNTER — Ambulatory Visit (INDEPENDENT_AMBULATORY_CARE_PROVIDER_SITE_OTHER): Admitting: Physician Assistant

## 2024-04-07 VITALS — BP 102/65 | HR 85 | Ht 70.0 in | Wt 210.0 lb

## 2024-04-07 DIAGNOSIS — N138 Other obstructive and reflux uropathy: Secondary | ICD-10-CM

## 2024-04-07 DIAGNOSIS — N401 Enlarged prostate with lower urinary tract symptoms: Secondary | ICD-10-CM

## 2024-04-07 MED ORDER — SULFAMETHOXAZOLE-TRIMETHOPRIM 800-160 MG PO TABS
1.0000 | ORAL_TABLET | Freq: Once | ORAL | Status: AC
  Administered 2024-04-07: 1 via ORAL

## 2024-04-07 NOTE — Patient Instructions (Signed)

## 2024-04-07 NOTE — Progress Notes (Signed)
 Catheter Removal  Patient is present today for a catheter removal.  55ml of water was drained from the balloon. A 24FR three-way foley cath was removed from the bladder, no complications were noted. Patient tolerated well.  Performed by: Ayannah Faddis, PA-C   Additional notes: Counseled patient on normal postoperative findings including dysuria, gross hematuria, and urinary urgency/leakage. Counseled patient to begin Kegel exercises 3x10 sets daily to increase urinary control and wear absorbent products as needed for security. Written and verbal resources provided today. Surgical pathology pending; will defer to Dr. Estanislao Heimlich to share results when available.   1 dose Bactrim  DS administered prior to Foley removal as above.  Follow up: Return in about 4 months (around 08/08/2024) for Postop f/u with Dr. Estanislao Heimlich.

## 2024-04-08 ENCOUNTER — Encounter (INDEPENDENT_AMBULATORY_CARE_PROVIDER_SITE_OTHER): Payer: Self-pay

## 2024-04-08 LAB — SURGICAL PATHOLOGY

## 2024-04-09 ENCOUNTER — Ambulatory Visit: Payer: Self-pay | Admitting: Urology

## 2024-04-09 DIAGNOSIS — N138 Other obstructive and reflux uropathy: Secondary | ICD-10-CM

## 2024-04-09 NOTE — Telephone Encounter (Signed)
PSA ordered. Lab appt scheduled.

## 2024-04-18 DIAGNOSIS — R972 Elevated prostate specific antigen [PSA]: Secondary | ICD-10-CM | POA: Diagnosis not present

## 2024-04-22 ENCOUNTER — Ambulatory Visit (INDEPENDENT_AMBULATORY_CARE_PROVIDER_SITE_OTHER): Admitting: Vascular Surgery

## 2024-04-22 DIAGNOSIS — N184 Chronic kidney disease, stage 4 (severe): Secondary | ICD-10-CM | POA: Diagnosis not present

## 2024-04-22 DIAGNOSIS — E1122 Type 2 diabetes mellitus with diabetic chronic kidney disease: Secondary | ICD-10-CM | POA: Diagnosis not present

## 2024-04-22 DIAGNOSIS — I10 Essential (primary) hypertension: Secondary | ICD-10-CM | POA: Diagnosis not present

## 2024-04-22 DIAGNOSIS — E785 Hyperlipidemia, unspecified: Secondary | ICD-10-CM | POA: Diagnosis not present

## 2024-04-22 NOTE — Telephone Encounter (Signed)
 Patient called in and wanted to know why he was not notified of Pathology results. Advised patient that there was a Clinical cytogeneticist message sent. Patient advised he does not use MyChart- I have since deactivated so this will not happen in future. Patient and wife given pathology results and discussed in great detail > 25 mins spent in discussion. Patient advised of follow up and needing to return for bloodwork prior to follow up. Encouraged to call with any future questions or concerns.

## 2024-04-27 ENCOUNTER — Ambulatory Visit: Payer: Self-pay | Admitting: Cardiology

## 2024-05-05 DIAGNOSIS — N184 Chronic kidney disease, stage 4 (severe): Secondary | ICD-10-CM | POA: Diagnosis not present

## 2024-05-05 DIAGNOSIS — I10 Essential (primary) hypertension: Secondary | ICD-10-CM | POA: Diagnosis not present

## 2024-05-05 DIAGNOSIS — N281 Cyst of kidney, acquired: Secondary | ICD-10-CM | POA: Diagnosis not present

## 2024-05-05 DIAGNOSIS — R2241 Localized swelling, mass and lump, right lower limb: Secondary | ICD-10-CM | POA: Diagnosis not present

## 2024-05-05 DIAGNOSIS — E1122 Type 2 diabetes mellitus with diabetic chronic kidney disease: Secondary | ICD-10-CM | POA: Diagnosis not present

## 2024-05-08 ENCOUNTER — Ambulatory Visit (INDEPENDENT_AMBULATORY_CARE_PROVIDER_SITE_OTHER)

## 2024-05-08 DIAGNOSIS — N189 Chronic kidney disease, unspecified: Secondary | ICD-10-CM | POA: Diagnosis not present

## 2024-05-08 DIAGNOSIS — I129 Hypertensive chronic kidney disease with stage 1 through stage 4 chronic kidney disease, or unspecified chronic kidney disease: Secondary | ICD-10-CM | POA: Diagnosis not present

## 2024-05-08 DIAGNOSIS — N184 Chronic kidney disease, stage 4 (severe): Secondary | ICD-10-CM | POA: Diagnosis not present

## 2024-05-08 DIAGNOSIS — I255 Ischemic cardiomyopathy: Secondary | ICD-10-CM

## 2024-05-08 DIAGNOSIS — D631 Anemia in chronic kidney disease: Secondary | ICD-10-CM | POA: Diagnosis not present

## 2024-05-08 DIAGNOSIS — N281 Cyst of kidney, acquired: Secondary | ICD-10-CM | POA: Diagnosis not present

## 2024-05-08 DIAGNOSIS — E875 Hyperkalemia: Secondary | ICD-10-CM | POA: Diagnosis not present

## 2024-05-08 LAB — CUP PACEART REMOTE DEVICE CHECK
Battery Remaining Longevity: 6 mo
Battery Voltage: 2.75 V
Brady Statistic AP VP Percent: 50.2 %
Brady Statistic AP VS Percent: 0 %
Brady Statistic AS VP Percent: 49.73 %
Brady Statistic AS VS Percent: 0.01 %
Brady Statistic RA Percent Paced: 40.54 %
Brady Statistic RV Percent Paced: 99.99 %
Date Time Interrogation Session: 20250619001903
Implantable Lead Connection Status: 753985
Implantable Lead Connection Status: 753985
Implantable Lead Implant Date: 20180723
Implantable Lead Implant Date: 20180723
Implantable Lead Location: 753859
Implantable Lead Location: 753860
Implantable Lead Model: 3830
Implantable Lead Model: 5076
Implantable Pulse Generator Implant Date: 20180723
Lead Channel Impedance Value: 304 Ohm
Lead Channel Impedance Value: 342 Ohm
Lead Channel Impedance Value: 342 Ohm
Lead Channel Impedance Value: 456 Ohm
Lead Channel Pacing Threshold Amplitude: 0.875 V
Lead Channel Pacing Threshold Amplitude: 1.25 V
Lead Channel Pacing Threshold Pulse Width: 0.4 ms
Lead Channel Pacing Threshold Pulse Width: 0.4 ms
Lead Channel Sensing Intrinsic Amplitude: 0.875 mV
Lead Channel Sensing Intrinsic Amplitude: 0.875 mV
Lead Channel Sensing Intrinsic Amplitude: 7.125 mV
Lead Channel Sensing Intrinsic Amplitude: 7.125 mV
Lead Channel Setting Pacing Amplitude: 2 V
Lead Channel Setting Pacing Amplitude: 2.5 V
Lead Channel Setting Pacing Pulse Width: 0.8 ms
Lead Channel Setting Sensing Sensitivity: 2.8 mV
Zone Setting Status: 755011
Zone Setting Status: 755011

## 2024-05-09 ENCOUNTER — Telehealth: Payer: Self-pay | Admitting: *Deleted

## 2024-05-09 NOTE — Telephone Encounter (Signed)
 Alert received from [employer]:   PPM Scheduled remote reviewed. Normal device function.  Presenting rhythm: AP/VP. Estimated battery longevity: 6 months; routed to clinic to start monthly battery checks.      Spoke with patients spouse Sean [name] to notify of monthly battery checks.   She was appreciative of the phone call and update.   All questions answered.  Additional monthly remote checks scheduled.

## 2024-05-11 ENCOUNTER — Ambulatory Visit: Payer: Self-pay | Admitting: Cardiology

## 2024-05-14 ENCOUNTER — Encounter

## 2024-05-20 ENCOUNTER — Ambulatory Visit: Attending: Cardiovascular Disease | Admitting: Cardiovascular Disease

## 2024-05-20 ENCOUNTER — Encounter: Payer: Self-pay | Admitting: Cardiovascular Disease

## 2024-05-20 VITALS — BP 118/78 | HR 77 | Ht 70.5 in | Wt 217.4 lb

## 2024-05-20 DIAGNOSIS — I5022 Chronic systolic (congestive) heart failure: Secondary | ICD-10-CM

## 2024-05-20 DIAGNOSIS — I2510 Atherosclerotic heart disease of native coronary artery without angina pectoris: Secondary | ICD-10-CM | POA: Diagnosis not present

## 2024-05-20 DIAGNOSIS — I483 Typical atrial flutter: Secondary | ICD-10-CM | POA: Diagnosis not present

## 2024-05-20 DIAGNOSIS — E785 Hyperlipidemia, unspecified: Secondary | ICD-10-CM

## 2024-05-20 MED ORDER — APIXABAN 2.5 MG PO TABS: 2.5000 mg | ORAL_TABLET | Freq: Two times a day (BID) | ORAL | 11 refills | Status: DC

## 2024-05-20 NOTE — Patient Instructions (Signed)
 Medication Instructions:  DECREASE the Eliquis  to 2.5 mg twice daily  *If you need a refill on your cardiac medications before your next appointment, please call your pharmacy*  Lab Work: None ordered If you have labs (blood work) drawn today and your tests are completely normal, you will receive your results only by: MyChart Message (if you have MyChart) OR A paper copy in the mail If you have any lab test that is abnormal or we need to change your treatment, we will call you to review the results.  Testing/Procedures: None ordered  Follow-Up: At [facility], you and your health needs are our priority.  As part of our continuing mission to provide you with exceptional heart care, our providers are all part of one team.  This team includes your primary Cardiologist (physician) and Advanced Practice Providers or APPs (Physician Assistants and Nurse Practitioners) who all work together to provide you with the care you need, when you need it.  Your next appointment:   6 month(s)  Provider:   You may see Deatrice Cage, MD or one of the following Advanced Practice Providers on your designated Care Team:   Lonni Meager, NP Lesley Maffucci, PA-C Bernardino Bring, PA-C Cadence [place], PA-C Tylene Lunch, NP Barnie Hila, NP    We recommend signing up for the patient portal called MyChart.  Sign up information is provided on this After Visit Summary.  MyChart is used to connect with patients for Virtual Visits (Telemedicine).  Patients are able to view lab/test results, encounter notes, upcoming appointments, etc.  Non-urgent messages can be sent to your provider as well.   To learn more about what you can do with MyChart, go to [email or web address].

## 2024-05-20 NOTE — Progress Notes (Signed)
 Cardiology Office Note   Date:  05/20/2024   ID:  Sean JONETTA Perri Mickey., DOB 1941/02/08, MRN [id number]  PCP:  Valora Agent, MD  Cardiologist: Dr. Fernande  Chief Complaint  Patient presents with   Follow-up    6 month f/u no complaints today. Meds reviewed verbally with pt.      History of Present Illness: Sean Faughn. is a 83 y.o. male who is here today for follow-up visit regarding coronary artery disease and chronic systolic heart failure.   The patient has known history of pacemaker placement and PVCs.  Other medical problems include chronic kidney disease, atrial flutter, hypertension and obesity.    He had an echocardiogram done in May of 2019 which showed an EF of 35 to 40% with diffuse hypokinesis, mild mitral regurgitation and mildly dilated left atrium.  Due to this cardiomyopathy, he underwent CTA of the coronary arteries which showed evidence of three-vessel coronary artery disease with a left dominant system.  FFR was less than 0.6 in all vessels. Cardiac catheterization in 2019 showed left dominant coronary arteries with severe heavily calcified three-vessel coronary artery disease including subtotal occlusion of the mid LAD and ulcerated plaque in the proximal left circumflex.  He underwent CABG at [facility]. He underwent atrial flutter ablation in April 2021.  He was taken off anticoagulation with Eliquis .  He was hospitalized in July of 2024 with extensive right lower extremity DVT with suspected pulmonary embolism.  In addition, he had progressive acute on chronic kidney disease.  He required thrombectomy and has been placed back on Eliquis  since then.  Due to worsening renal disease, both spironolactone  and losartan  were discontinued.  Most recent echocardiogram in May of this year showed an EF of 40% with no significant valvular abnormalities.  He had laser prostate procedure in May.  He has been doing reasonably well with no chest pain or worsening  dyspnea.  Past Medical History:  Diagnosis Date   Acute encephalopathy 01/25/2024   Anemia    Aortic atherosclerosis (HCC)    Arthritis    Asthma    BPH with urinary obstruction    CAD (coronary artery disease)    a. LHC 7/19: ostLM 30%, ostLAD 30%, p-mLAD 99% mod calcified, ostD1 90%, ost-pLCx 80% ulcerative & mod calcified, p-mLCx 50%, OM2 50%, mod dz LPDA, RCA small w/ diffuse dz throughout; b. recommendation of CABG; c. 3-V CABG 08/26/18 (LIMA-LAD, VG-Diag, VG-LCx)   Cerebral microvascular disease    CKD (chronic kidney disease), stage III (HCC)    Complete heart block (HCC)    a.) symptomatic SB in setting of CHB --> dual chamber MDT PPM placed 06/11/2017   COPD (chronic obstructive pulmonary disease) (HCC)    CVA (cerebral vascular accident) (HCC)    a.) noted on CT head 01/25/2024: remote infarct involving the head of the right caudate and anterior limb of the right internal capsule   Deep vein thrombosis (DVT) of upper extremity (HCC) 06/17/2018   a.) LEFT subclavian --> Tx'd with apixaban    DVT, lower extremity, proximal, acute (HCC)    Dyslipidemia    GERD (gastroesophageal reflux disease)    Gout    Hemothorax on left    HFrEF (heart failure with reduced ejection fraction) (HCC)    History of pulmonary embolus (PE)    HOH (hard of hearing)    Hypertension    Ischemic cardiomyopathy    Loculated pleural effusion (LEFT)    a.) s/p VATS 09/24/2018  Multiple renal cysts    Obesity    On apixaban  therapy    Paroxysmal atrial flutter (HCC)    a.) CHA2DS2VASc = 7 (age x2, HFrEF, HTN, PE/DVT x 2, vascular disease) as of 04/01/2024; b.) s/p RFCA 03/03/2020; c.) rate/rhythm maintained intrinsically without pharmacological intervention; chronically anticoagulated with apixaban    Pneumonia 2000   Pre-diabetes    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 Harvested endoscopically.;  Surgeon: Fleeta Hanford Coy, MD;  Location: [facility] OR;  Service: Open Heart Surgery;  Laterality: N/A;   EPICARDIAL PACING LEAD PLACEMENT N/A 08/26/2018   Procedure: LV PACING LEAD PLACEMENT;  Surgeon: Fleeta Hanford Coy, MD;  Location: [facility] OR;  Service: Thoracic;  Laterality: N/A;   EPICARDIAL PACING LEAD PLACEMENT Left 11/07/2018   Procedure: REVISE PACEMAKER LEAD LEFT CHEST;  Surgeon: Fleeta Hanford Coy, MD;  Location: [facility] OR;  Service: Thoracic;  Laterality: Left;   HOLEP-LASER ENUCLEATION OF THE PROSTATE WITH MORCELLATION N/A 04/04/2024   Procedure: ENUCLEATION, PROSTATE, USING LASER, WITH MORCELLATION;  Surgeon: Francisca Redell BROCKS, MD;  Location: [facility] ORS;  Service: Urology;  Laterality: N/A;   IVC FILTER INSERTION     LEFT HEART CATH AND CORONARY ANGIOGRAPHY N/A 06/13/2018   Procedure: LEFT HEART CATH AND CORONARY ANGIOGRAPHY;  Surgeon: Darron Deatrice LABOR, MD;  Location: [facility] INVASIVE CV LAB;  Service: Cardiovascular;  Laterality: N/A;   LEG SURGERY     LUMBAR DISC SURGERY     PACEMAKER IMPLANT N/A 06/11/2017   Procedure: Pacemaker Implant;  Surgeon: Waddell Danelle ORN, MD;  Location: [facility] INVASIVE CV LAB;  Service: Cardiovascular;  Laterality: N/A;   PERIPHERAL VASCULAR THROMBECTOMY Right 06/13/2023   Procedure: PERIPHERAL VASCULAR THROMBECTOMY;  Surgeon: Marea Selinda RAMAN, MD;  Location: [facility] INVASIVE CV LAB;  Service: Cardiovascular;  Laterality: Right;   PLEURAL EFFUSION DRAINAGE  Left 09/24/2018   Procedure: DRAINAGE OF LOCULATED PLEURAL EFFUSION;  Surgeon: Fleeta Hanford Coy, MD;  Location: [facility] OR;  Service: Thoracic;  Laterality: Left;   TEE WITHOUT CARDIOVERSION N/A 08/26/2018   Procedure: TRANSESOPHAGEAL ECHOCARDIOGRAM (TEE);  Surgeon: Fleeta Hanford, Coy, MD;  Location: [facility] OR;  Service: Open Heart Surgery;  Laterality: N/A;   TEE WITHOUT CARDIOVERSION  03/03/2020   Procedure: Transesophageal Echocardiogram (Tee);  Surgeon: Fernande Elspeth BROCKS, MD;  Location: [facility] INVASIVE CV LAB;  Service: Cardiovascular;;   VIDEO ASSISTED THORACOSCOPY Left 09/24/2018   Procedure: VIDEO ASSISTED THORACOSCOPY;  Surgeon: Fleeta Hanford, Coy, MD;  Location: [facility] OR;  Service: Thoracic;  Laterality: Left;     Current Outpatient Medications  Medication Sig Dispense Refill   acetaminophen  (TYLENOL ) 500 MG tablet Take 500 mg by mouth every 6 (six) hours as needed for moderate pain or headache.     allopurinol  (ZYLOPRIM ) 100 MG tablet Take 100 mg by mouth at bedtime.     apixaban  (ELIQUIS ) 5 MG TABS tablet Take 1  tablet (5 mg total) by mouth 2 (two) times daily. 60 tablet 5   atorvastatin  (LIPITOR) 40 MG tablet TAKE ONE TABLET EVERY DAY (Patient taking differently: Take 40 mg by mouth at bedtime.) 90 tablet 3   EPINEPHrine  (PRIMATENE  MIST) 0.125 MG/ACT AERO Inhale 1 puff into the lungs daily as needed (shortness of breath).     ferrous sulfate  325 (65 FE) MG EC tablet Take 325 mg by mouth 2 (two) times daily.     loratadine  (CLARITIN ) 10 MG tablet Take 1 tablet (10 mg total) by mouth daily. (Patient taking differently: Take 10 mg by mouth at bedtime.) 90 tablet 3   Multiple Vitamin (MULTIVITAMIN WITH MINERALS) TABS tablet Take 1 tablet by mouth daily.     Nutritional Supplements (FEEDING SUPPLEMENT, NEPRO CARB STEADY,) LIQD Take 237 mLs by mouth 2 (two) times daily between meals. (Patient taking differently: Take 237 mLs by mouth daily.)     omeprazole (PRILOSEC) 20 MG capsule Take 20 mg by mouth every  morning.     psyllium (REGULOID) 0.52 g capsule Take 0.52 g by mouth daily as needed (constipation).     No current facility-administered medications for this visit.    Allergies:   Azithromycin and Erythromycin    Social History:  The patient  reports that he has never smoked. He has never used smokeless tobacco. He reports current alcohol use. He reports that he does not use drugs.   Family History:  The patient's family history includes Alcohol abuse in his brother and father; Diabetes in his brother; Stroke in his father, maternal grandfather, and mother.    ROS:  Please see the history of present illness.   Otherwise, review of systems are positive for none.   All other systems are reviewed and negative.    PHYSICAL EXAM: VS:  BP 118/78 (BP Location: Left Arm, Patient Position: Sitting, Cuff Size: Large)   Pulse 77   Ht 5' 10.5 (1.791 m)   Wt 217 lb 6 oz (98.6 kg)   SpO2 96%   BMI 30.75 kg/m  , BMI Body mass index is 30.75 kg/m. GEN: Well nourished, well developed, in no acute distress  HEENT: normal  Neck: no JVD, carotid bruits, or masses Cardiac: RRR; no murmurs, rubs, or gallops, moderate right leg swelling. Respiratory:  clear to auscultation bilaterally, normal work of breathing GI: soft, nontender, nondistended, + BS MS: no deformity or atrophy  Skin: warm and dry, no rash Neuro:  Strength and sensation are intact Psych: euthymic mood, full affect Radial pulses normal.  EKG:  EKG is ordered today. The ekg ordered today demonstrates : Atrial-sensed ventricular-paced rhythm When compared with ECG of 18-Mar-2024 09:34, No significant change was found     Recent Labs: 01/27/2024: ALT 18 02/02/2024: BUN 52; Creatinine, Ser 3.07; Hemoglobin 10.4; Magnesium  1.8; Platelets 243; Potassium 4.4; Sodium 137    Lipid Panel    Component Value Date/Time   CHOL 116 06/26/2018 0605   TRIG 64 06/26/2018 0605   HDL 43 06/26/2018 0605   CHOLHDL 2.7 06/26/2018 0605    VLDL 13 06/26/2018 0605   LDLCALC 60 06/26/2018 0605      Wt Readings from Last 3 Encounters:  05/20/24 217 lb 6 oz (98.6 kg)  04/07/24 210 lb (95.3 kg)  04/04/24 209 lb (94.8 kg)          No data to display            ASSESSMENT AND PLAN:  1.  Coronary  artery disease involving native coronary arteries without angina: He is doing well post CABG with no anginal symptoms.  No aspirin  given that he is on anticoagulation with Eliquis   2.  Chronic systolic heart failure: Likely due to ischemic cardiomyopathy.  Most recent echocardiogram showed improvement in ejection fraction to 45 to 50%.  He is off heart failure medications due to low blood pressure.  He is not a good candidate for an SGLT2 inhibitor given prostate issue and history of cystitis.  3.  Hyperlipidemia: Continue treatment with atorvastatin  with a target LDL of less than 70.  Most recent lipid profile showed an LDL of 58.  4.  Status post pacemaker placement: Most recent device interrogation showed 20% burden of atrial tachycardia/A-fib.  6.  Extensive right lower extremity DVT: The plan is to finish 1 year of anticoagulation.  However, given history of paroxysmal atrial fibrillation and previous concerns about LV thrombus, recommend continuing long-term anticoagulation but with a lower dose of Eliquis  2.5 mg twice daily considering his age and advanced chronic kidney disease.    Disposition:   FU with me in 6 months  Signed,  Deatrice Cage, MD  05/20/2024 1:20 PM    [facility]

## 2024-06-02 ENCOUNTER — Other Ambulatory Visit: Payer: Self-pay | Admitting: Cardiology

## 2024-06-04 ENCOUNTER — Encounter: Payer: Self-pay | Admitting: Urology

## 2024-06-09 ENCOUNTER — Ambulatory Visit

## 2024-06-10 LAB — CUP PACEART REMOTE DEVICE CHECK
Battery Remaining Longevity: 4 mo
Battery Voltage: 2.72 V
Brady Statistic AP VP Percent: 55.37 %
Brady Statistic AP VS Percent: 0 %
Brady Statistic AS VP Percent: 44.63 %
Brady Statistic AS VS Percent: 0.01 %
Brady Statistic RA Percent Paced: 55.29 %
Brady Statistic RV Percent Paced: 99.99 %
Date Time Interrogation Session: 20250721001815
Implantable Lead Connection Status: 753985
Implantable Lead Connection Status: 753985
Implantable Lead Implant Date: 20180723
Implantable Lead Implant Date: 20180723
Implantable Lead Location: 753859
Implantable Lead Location: 753860
Implantable Lead Model: 3830
Implantable Lead Model: 5076
Implantable Pulse Generator Implant Date: 20180723
Lead Channel Impedance Value: 323 Ohm
Lead Channel Impedance Value: 342 Ohm
Lead Channel Impedance Value: 361 Ohm
Lead Channel Impedance Value: 475 Ohm
Lead Channel Pacing Threshold Amplitude: 0.875 V
Lead Channel Pacing Threshold Amplitude: 1.25 V
Lead Channel Pacing Threshold Pulse Width: 0.4 ms
Lead Channel Pacing Threshold Pulse Width: 0.4 ms
Lead Channel Sensing Intrinsic Amplitude: 1 mV
Lead Channel Sensing Intrinsic Amplitude: 1 mV
Lead Channel Sensing Intrinsic Amplitude: 7.125 mV
Lead Channel Sensing Intrinsic Amplitude: 7.125 mV
Lead Channel Setting Pacing Amplitude: 2 V
Lead Channel Setting Pacing Amplitude: 2.5 V
Lead Channel Setting Pacing Pulse Width: 0.8 ms
Lead Channel Setting Sensing Sensitivity: 2.8 mV
Zone Setting Status: 755011
Zone Setting Status: 755011

## 2024-06-11 ENCOUNTER — Ambulatory Visit: Payer: Self-pay | Admitting: Cardiology

## 2024-06-24 ENCOUNTER — Encounter (INDEPENDENT_AMBULATORY_CARE_PROVIDER_SITE_OTHER): Payer: Self-pay | Admitting: Vascular Surgery

## 2024-06-24 ENCOUNTER — Encounter: Admitting: Cardiology

## 2024-06-24 ENCOUNTER — Ambulatory Visit (INDEPENDENT_AMBULATORY_CARE_PROVIDER_SITE_OTHER): Admitting: Vascular Surgery

## 2024-06-24 VITALS — BP 169/80 | HR 69 | Ht 70.5 in | Wt 224.0 lb

## 2024-06-24 DIAGNOSIS — E785 Hyperlipidemia, unspecified: Secondary | ICD-10-CM

## 2024-06-24 DIAGNOSIS — Z95828 Presence of other vascular implants and grafts: Secondary | ICD-10-CM | POA: Diagnosis not present

## 2024-06-24 DIAGNOSIS — I10 Essential (primary) hypertension: Secondary | ICD-10-CM

## 2024-06-24 DIAGNOSIS — I824Y1 Acute embolism and thrombosis of unspecified deep veins of right proximal lower extremity: Secondary | ICD-10-CM | POA: Diagnosis not present

## 2024-06-24 NOTE — Progress Notes (Unsigned)
 MRN : [id number]  Sean [name]. is a 83 y.o. (12-11-1940) male who presents with chief complaint of  Chief Complaint  Patient presents with   Follow-up  .  History of Present Illness: Patient returns today in follow up of DVT and previous IVC filter.  He is doing well.  He has no current chest pain or shortness of breath.  He has mild occasional leg swelling but nothing concerning or bothersome.  He had an extensive right lower extremity DVT treated with thrombectomy and filter placement about a year ago.  He did not have his filter removed initially due to other upcoming procedures and potential need for cessation of anticoagulation.  He is on a prophylactic dose of Eliquis  at this point and is here to discuss filter removal.  Current Outpatient Medications  Medication Sig Dispense Refill   acetaminophen  (TYLENOL ) 500 MG tablet Take 500 mg by mouth every 6 (six) hours as needed for moderate pain or headache.     allopurinol  (ZYLOPRIM ) 100 MG tablet Take 100 mg by mouth at bedtime.     apixaban  (ELIQUIS ) 2.5 MG TABS tablet Take 1 tablet (2.5 mg total) by mouth 2 (two) times daily. 60 tablet 11   atorvastatin  (LIPITOR) 40 MG tablet TAKE ONE TABLET EVERY DAY (Patient taking differently: Take 40 mg by mouth at bedtime.) 90 tablet 3   EPINEPHrine  (PRIMATENE  MIST) 0.125 MG/ACT AERO Inhale 1 puff into the lungs daily as needed (shortness of breath).     ferrous sulfate  325 (65 FE) MG EC tablet Take 325 mg by mouth 2 (two) times daily.     loratadine  (CLARITIN ) 10 MG tablet Take 1 tablet (10 mg total) by mouth daily. (Patient taking differently: Take 10 mg by mouth at bedtime.) 90 tablet 3   Multiple Vitamin (MULTIVITAMIN WITH MINERALS) TABS tablet Take 1 tablet by mouth daily.     Nutritional Supplements (FEEDING SUPPLEMENT, NEPRO CARB STEADY,) LIQD Take 237 mLs by mouth 2 (two) times daily between meals. (Patient taking differently: Take 237 mLs by mouth daily.)     omeprazole (PRILOSEC) 20  MG capsule Take 20 mg by mouth every morning.     psyllium (REGULOID) 0.52 g capsule Take 0.52 g by mouth daily as needed (constipation).     No current facility-administered medications for this visit.    Past Medical History:  Diagnosis Date   Acute encephalopathy 01/25/2024   Anemia    Aortic atherosclerosis (HCC)    Arthritis    Asthma    BPH with urinary obstruction    CAD (coronary artery disease)    a. LHC 7/19: ostLM 30%, ostLAD 30%, p-mLAD 99% mod calcified, ostD1 90%, ost-pLCx 80% ulcerative & mod calcified, p-mLCx 50%, OM2 50%, mod dz LPDA, RCA small w/ diffuse dz throughout; b. recommendation of CABG; c. 3-V CABG 08/26/18 (LIMA-LAD, VG-Diag, VG-LCx)   Cerebral microvascular disease    CKD (chronic kidney disease), stage III (HCC)    Complete heart block (HCC)    a.) symptomatic SB in setting of CHB --> dual chamber MDT PPM placed 06/11/2017   COPD (chronic obstructive pulmonary disease) (HCC)    CVA (cerebral vascular accident) (HCC)    a.) noted on CT head 01/25/2024: remote infarct involving the head of the right caudate and anterior limb of the right internal capsule   Deep vein thrombosis (DVT) of upper extremity (HCC) 06/17/2018   a.) LEFT subclavian --> Tx'd with apixaban    DVT, lower extremity, proximal, acute (  HCC)    Dyslipidemia    GERD (gastroesophageal reflux disease)    Gout    Hemothorax on left    HFrEF (heart failure with reduced ejection fraction) (HCC)    History of pulmonary embolus (PE)    HOH (hard of hearing)    Hypertension    Ischemic cardiomyopathy    Loculated pleural effusion (LEFT)    a.) s/p VATS 09/24/2018   Multiple renal cysts    Obesity    On apixaban  therapy    Paroxysmal atrial flutter (HCC)    a.) CHA2DS2VASc = 7 (age x2, HFrEF, HTN, PE/DVT x 2, vascular disease) as of 04/01/2024; b.) s/p RFCA 03/03/2020; c.) rate/rhythm maintained intrinsically without pharmacological intervention; chronically anticoagulated with apixaban     Pneumonia 2000   Pre-diabetes    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 Harvested endoscopically.;  Surgeon: Fleeta Hanford Coy, MD;  Location: [facility] OR;  Service: Open Heart Surgery;  Laterality: N/A;   EPICARDIAL PACING LEAD PLACEMENT N/A 08/26/2018   Procedure: LV PACING LEAD PLACEMENT;  Surgeon: Fleeta Hanford Coy, MD;  Location: [facility] OR;  Service: Thoracic;  Laterality: N/A;   EPICARDIAL PACING LEAD PLACEMENT Left 11/07/2018   Procedure: REVISE PACEMAKER LEAD LEFT CHEST;  Surgeon: Fleeta Hanford Coy, MD;  Location: [facility] OR;  Service: Thoracic;  Laterality: Left;   HOLEP-LASER ENUCLEATION OF THE PROSTATE WITH MORCELLATION N/A 04/04/2024   Procedure: ENUCLEATION, PROSTATE, USING LASER, WITH MORCELLATION;  Surgeon: Francisca Redell BROCKS, MD;  Location: [facility] ORS;  Service: Urology;  Laterality: N/A;   IVC FILTER INSERTION     LEFT HEART CATH AND CORONARY ANGIOGRAPHY N/A 06/13/2018   Procedure: LEFT HEART CATH AND CORONARY ANGIOGRAPHY;  Surgeon: Darron Deatrice LABOR, MD;  Location: [facility] INVASIVE CV LAB;  Service: Cardiovascular;  Laterality: N/A;   LEG SURGERY     LUMBAR DISC SURGERY     PACEMAKER IMPLANT N/A 06/11/2017   Procedure: Pacemaker Implant;  Surgeon: Waddell Danelle ORN, MD;  Location: [facility] INVASIVE CV LAB;  Service: Cardiovascular;  Laterality: N/A;   PERIPHERAL VASCULAR THROMBECTOMY  Right 06/13/2023   Procedure: PERIPHERAL VASCULAR THROMBECTOMY;  Surgeon: Marea Selinda RAMAN, MD;  Location: [facility] INVASIVE CV LAB;  Service: Cardiovascular;  Laterality: Right;   PLEURAL EFFUSION DRAINAGE Left 09/24/2018   Procedure: DRAINAGE OF LOCULATED PLEURAL EFFUSION;  Surgeon: Fleeta Hanford Coy, MD;  Location: [facility] OR;  Service: Thoracic;  Laterality: Left;   TEE WITHOUT CARDIOVERSION N/A 08/26/2018   Procedure: TRANSESOPHAGEAL ECHOCARDIOGRAM (TEE);  Surgeon: Fleeta Hanford, Coy, MD;  Location:  [facility] OR;  Service: Open Heart Surgery;  Laterality: N/A;   TEE WITHOUT CARDIOVERSION  03/03/2020   Procedure: Transesophageal Echocardiogram (Tee);  Surgeon: Fernande Elspeth BROCKS, MD;  Location: [facility] INVASIVE CV LAB;  Service: Cardiovascular;;   VIDEO ASSISTED THORACOSCOPY Left 09/24/2018   Procedure: VIDEO ASSISTED THORACOSCOPY;  Surgeon: Fleeta Hanford Coy, MD;  Location: [facility] OR;  Service: Thoracic;  Laterality: Left;     Social History   Tobacco Use   Smoking status: Never   Smokeless tobacco: Never  Vaping Use   Vaping status: Never Used  Substance Use Topics   Alcohol use: Yes    Comment: rare   Drug use: No       Family History  Problem Relation Age of Onset   Stroke Mother    Stroke Father    Alcohol abuse Father    Diabetes Brother    Alcohol abuse Brother    Stroke Maternal Grandfather      Allergies  Allergen Reactions   Azithromycin Hives   Erythromycin Hives     REVIEW OF SYSTEMS (Negative unless checked)   Constitutional: [] Weight loss  [] Fever  [] Chills Cardiac: [] Chest pain   [] Chest pressure   [x] Palpitations   [] Shortness of breath when laying flat   [] Shortness of breath at rest   [] Shortness of breath with exertion. Vascular:  [] Pain in legs with walking   [] Pain in legs at rest   [] Pain in legs when laying flat   [] Claudication   [] Pain in feet when walking  [] Pain in feet at rest  [] Pain in feet when laying flat   [x] History of DVT   [x] Phlebitis   [x] Swelling in legs    [] Varicose veins   [] Non-healing ulcers Pulmonary:   [] Uses home oxygen   [] Productive cough   [] Hemoptysis   [] Wheeze  [] COPD   [x] Asthma Neurologic:  [] Dizziness  [] Blackouts   [] Seizures   [] History of stroke   [] History of TIA  [] Aphasia   [] Temporary blindness   [] Dysphagia   [] Weakness or numbness in arms   [] Weakness or numbness in legs Musculoskeletal:  [x] Arthritis   [] Joint swelling   [x] Joint pain   [] Low back pain Hematologic:  [] Easy bruising  [] Easy bleeding   [] Hypercoagulable state   [] Anemic   Gastrointestinal:  [] Blood in stool   [] Vomiting blood  [x] Gastroesophageal reflux/heartburn   [] Abdominal pain Genitourinary:  [] Chronic kidney disease   [] Difficult urination  [] Frequent urination  [] Burning with urination   [] Hematuria Skin:  [] Rashes   [] Ulcers   [] Wounds Psychological:  [] History of anxiety   []  History of major depression.  Physical Examination  BP (!) 169/80   Pulse 69   Ht 5' 10.5 (1.791 m)   Wt 224 lb (101.6 kg)   BMI 31.69 kg/m  Gen:  WD/WN, NAD Head: [place]/AT, No temporalis wasting. Ear/Nose/Throat: Hearing grossly intact, nares w/o erythema or drainage Eyes: Conjunctiva clear. Sclera non-icteric Neck: Supple.  Trachea midline Pulmonary:  Good air movement, no use of accessory muscles.  Cardiac: RRR, no JVD Vascular:  Vessel Right Left  Radial Palpable Palpable           Musculoskeletal: M/S 5/5 throughout.  No deformity or atrophy. No significant lower extremity edema. Neurologic: Sensation grossly intact in extremities.  Symmetrical.  Speech is fluent.  Psychiatric: Judgment intact, Mood & affect appropriate for pt's clinical situation. Dermatologic: No rashes or ulcers noted.  No cellulitis or open wounds.      Labs Recent Results (from the past 2160 hours)  Surgical pathology     Status: None   Collection Time: 04/04/24 12:00 AM  Result Value Ref Range   SURGICAL PATHOLOGY      SURGICAL PATHOLOGY [facility] [address] [place], [place] [address] Telephone [phone] or [phone] Fax [phone]  REPORT  OF SURGICAL PATHOLOGY   Accession #: [id number] Patient Name: DAMIEL, BARTHOLD Visit # : [id number]  MRN: [id number] Physician: Francisca Rogue DOB/Age [birth year] (Age: 43) Gender: M Collected Date: 04/04/2024 Received Date: 04/04/2024  FINAL DIAGNOSIS       1. 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 The diagnosis of carcinoma is supported by the failure of immunohistochemical staining for high molecular weight cytokeratin and p63 to demonstrate basal cells in the atypical glands. Also favoring the diagnosis of cancer is that stains for racemase, nkx3.1 and  cytokeratin ck8/18 are positive. Gleason scores can be grouped and range from Prognostic Grade Group 1 (most favorable) to Prognostic Grade group 5 (least favorable). Prognostic Grade Group 1: Gleason score 3+3=6 Prognostic Grade Group 2: Gleason score 3+4=7 Prognostic Grade Group 3: Gleason score 4+3=7 Prognostic Grade Group 4: Gleason score 4+4=8 Prognostic Grade Group 5: Gleason score 9-10 This new grading system, using the above terminology, has been accepted by the 2016 World Health Organization (WHO) Elbert ESPY, Zelefsky MJ, Sjoberg DD, et al. A Contemporary prostate cancer grading system: A validated alternative to the Gleason score. Eur Urol (2015).  CASE COMMENTS STAINS USED IN DIAGNOSIS: H&E H&E H&E H&E H&E H&E H&E H&E Universal Negative Control-DAB Stains used in diagnosis 1 CK8/18 (LMW), 1 NKX3.1, 1 PIN 4 Some of these immunohistochemical stains may have been developed and the performance characteristics determined by [facility].  So me  may not have been cleared or approved by the U.S. Food and Drug Administration.  The FDA has determined that such clearance or approval is not necessary.  This test is used for clinical purposes.  It should not be regarded as investigational or for research.  This laboratory is certified under the Clinical Laboratory Improvement Amendments of 1988 (CLIA-88) as qualified to perform high complexity clinical laboratory testing. Some of these immunohistochemical stains may have been developed and the performance characteristics determined by [facility]. Some may not have been cleared or approved by the U.S. Food and Drug Administration (FDA). The FDA has determined that such clearance and approval is not necessary. This test is used for clinical purposes. It should not be regarded as investigational or for research. This laboratory is certified under the Clinical Laboratory Improvement Amendments of 1988 (CLIA-88) as qualified to perform high complexity clini cal laboratory testing.    CLINICAL HISTORY  SPECIMEN(S) OBTAINED 1. Prostate, TUR, Prostate Chips  SPECIMEN COMMENTS: SPECIMEN CLINICAL INFORMATION: 1. Benign prostatic hyperplasia with urinary obstruction    Gross Description 1. Specimen: Prostate chips      Weight (fresh): 18.9 g      Size (aggregate): 6.0 x 3.2 x 2.3 cm      Description: Multiple tan-pink, soft tissue fragments without discrete yellow      discoloration.      Block summary: Representative sections are submitted a block (1A-H).      AMG 04/04/2024        Report signed out from the following location(s) [place]  [facility] [address] [place], [place] [address] CLIA #: [id number]  [facility] [address] [place], [place] [address] CLIA #: [id number]   CUP PACEART REMOTE DEVICE CHECK  Status: None   Collection Time: 05/08/24 12:19 AM  Result Value Ref Range   Date Time Interrogation Session  [id number]    Pulse Generator Manufacturer MERM    Pulse Gen Model W1DR01 Azure XT DR MRI    Pulse Gen Serial Number [id number]    Clinic Name [facility]    Implantable Pulse Generator Type Implantable Pulse Generator    Implantable Pulse Generator Implant Date 79819276    Implantable Lead Manufacturer MERM    Implantable Lead Model 3830 SelectSecure    Implantable Lead Serial Number [id number]    Implantable Lead Implant Date 79819276    Implantable Lead Location Detail 1 UNKNOWN    Implantable Lead Special Function bundle of his    Implantable Lead Location [id number]    Implantable Lead Connection Status [id number]    Implantable Lead Manufacturer MERM    Implantable Lead Model 5076 CapSureFix Novus MRI SureScan    Implantable Lead Serial Number [id number]    Implantable Lead Implant Date 79819276    Implantable Lead Location Detail 1 APPENDAGE    Implantable Lead Location [id number]    Implantable Lead Connection Status [id number]    Lead Channel Setting Sensing Sensitivity 2.8 mV   Lead Channel Setting Pacing Amplitude 2 V   Lead Channel Setting Pacing Pulse Width 0.8 ms   Lead Channel Setting Pacing Amplitude 2.5 V   Zone Setting Status 755011    Zone Setting Status 755011    Lead Channel Impedance Value 342 ohm   Lead Channel Impedance Value 304 ohm   Lead Channel Sensing Intrinsic Amplitude 0.875 mV   Lead Channel Sensing Intrinsic Amplitude 0.875 mV   Lead Channel Pacing Threshold Amplitude 0.875 V   Lead Channel Pacing Threshold Pulse Width 0.4 ms   Lead Channel Impedance Value 456 ohm   Lead Channel Impedance Value 342 ohm   Lead Channel Sensing Intrinsic Amplitude 7.125 mV   Lead Channel Sensing Intrinsic Amplitude 7.125 mV   Lead Channel Pacing Threshold Amplitude 1.25 V   Lead Channel Pacing Threshold Pulse Width 0.4 ms   Battery Status OK    Battery Remaining Longevity 6 mo   Battery Voltage 2.75 V   Brady Statistic RA Percent Paced 40.54 %   Brady Statistic RV  Percent Paced 99.99 %   Brady Statistic AP VP Percent 50.2 %   Brady Statistic AS VP Percent 49.73 %   Brady Statistic AP VS Percent 0 %   Brady Statistic AS VS Percent 0.01 %  CUP PACEART REMOTE DEVICE CHECK     Status: None   Collection Time: 06/09/24 12:18 AM  Result Value Ref Range   Date Time Interrogation Session [phone]    Pulse Generator Manufacturer MERM    Pulse Gen Model [id number] Azure XT DR MRI    Pulse Gen Serial Number [id number]    Clinic Name [facility]    Implantable Pulse Generator Type Implantable Pulse Generator    Implantable Pulse Generator Implant Date 79819276    Implantable Lead Manufacturer [facility]    Implantable Lead Model 3830 SelectSecure    Implantable Lead Serial Number [id number]    Implantable Lead Implant Date 79819276    Implantable Lead Location Detail 1 UNKNOWN    Implantable Lead Special Function bundle of his    Implantable Lead Location [id number]    Implantable Lead Connection Status [id number]    Implantable Lead Manufacturer MERM    Implantable Lead Model 5076 CapSureFix Novus MRI SureScan    Implantable Lead  Serial Number [id number]    Implantable Lead Implant Date 79819276    Implantable Lead Location Detail 1 APPENDAGE    Implantable Lead Location 246140    Implantable Lead Connection Status 246014    Lead Channel Setting Sensing Sensitivity 2.8 mV   Lead Channel Setting Pacing Amplitude 2 V   Lead Channel Setting Pacing Pulse Width 0.8 ms   Lead Channel Setting Pacing Amplitude 2.5 V   Zone Setting Status 755011    Zone Setting Status [phone]    Lead Channel Impedance Value 361 ohm   Lead Channel Impedance Value 323 ohm   Lead Channel Sensing Intrinsic Amplitude 1 mV   Lead Channel Sensing Intrinsic Amplitude 1 mV   Lead Channel Pacing Threshold Amplitude 0.875 V   Lead Channel Pacing Threshold Pulse Width 0.4 ms   Lead Channel Impedance Value 475 ohm   Lead Channel Impedance Value 342 ohm   Lead Channel Sensing Intrinsic  Amplitude 7.125 mV   Lead Channel Sensing Intrinsic Amplitude 7.125 mV   Lead Channel Pacing Threshold Amplitude 1.25 V   Lead Channel Pacing Threshold Pulse Width 0.4 ms   Battery Status OK    Battery Remaining Longevity 4 mo   Battery Voltage 2.72 V   Brady Statistic RA Percent Paced 55.29 %   Brady Statistic RV Percent Paced 99.99 %   Brady Statistic AP VP Percent 55.37 %   Brady Statistic AS VP Percent 44.63 %   Brady Statistic AP VS Percent 0 %   Brady Statistic AS VS Percent 0.01 %    Radiology CUP PACEART REMOTE DEVICE CHECK Result Date: 06/10/2024 [address] [facility] Monthly battery check.  Estimated 4 months remaining longevity. Normal device function. No new alerts. Follow up as scheduled monthly. - CS, CVRS   Assessment/Plan  DVT, lower extremity, proximal, acute, right (HCC) Did well with thrombectomy and filter placement.  On prophylactic dose of Eliquis .  Filter can now be removed.  S/P IVC filter I had a discussion with he and his wife today regarding the IVC filter.  He is now on a prophylactic dose of anticoagulation.  The IVC filter poses minimal risk but not 0 risk long-term of thrombosis, migration, or end fracture.  His filter has been in a little over a year, but would likely still be removable if he so chose.  In discussions with he and his wife, we have elected to proceed with IVC filter removal.  Risks and benefits are discussed.  It was discussed that if the filter hook was embedded in the caval wall, the filter may not be removable and in that instance would be left in place.  They voiced their understanding.  Essential hypertension blood pressure control important in reducing the progression of atherosclerotic disease. On appropriate oral medications.     Dyslipidemia lipid control important in reducing the progression of atherosclerotic disease. Continue statin therapy  Selinda Gu, MD  06/27/2024 8:43 AM    This note was created with Dragon medical transcription  system.  Any errors from dictation are purely unintentional

## 2024-06-27 ENCOUNTER — Telehealth (INDEPENDENT_AMBULATORY_CARE_PROVIDER_SITE_OTHER): Payer: Self-pay

## 2024-06-27 NOTE — Assessment & Plan Note (Signed)
 I had a discussion with he and his wife today regarding the IVC filter.  He is now on a prophylactic dose of anticoagulation.  The IVC filter poses minimal risk but not 0 risk long-term of thrombosis, migration, or end fracture.  His filter has been in a little over a year, but would likely still be removable if he so chose.  In discussions with he and his wife, we have elected to proceed with IVC filter removal.  Risks and benefits are discussed.  It was discussed that if the filter hook was embedded in the caval wall, the filter may not be removable and in that instance would be left in place.  They voiced their understanding.

## 2024-06-27 NOTE — Telephone Encounter (Signed)
I attempted to contact the patient to schedule him for a IVC filter removal with Dr. Wyn Quaker. A message was left for a return call. ?

## 2024-06-27 NOTE — Assessment & Plan Note (Signed)
 Did well with thrombectomy and filter placement.  On prophylactic dose of Eliquis .  Filter can now be removed.

## 2024-06-27 NOTE — Patient Instructions (Signed)
 Inferior Vena Cava Filter Removal  Inferior vena cava filter removal is a procedure to take out a metal filter that was placed into a large vein in the abdomen (inferior vena cava, or IVC). An IVC filter prevents blood clots in the legs or pelvis from traveling to the lungs. Some IVC filters are designed to be removed (retrievable filters). You may have your filter removed when the danger of forming blood clots has passed or when you can take blood-thinning medicine to prevent blood clots. In some cases, the filter may need to be removed because it becomes damaged, is not working, or is causing problems. Most filters can be removed through the vein (percutaneously). In the rare cases when the health care provider is unable to remove the filter percutaneously, one of these steps may be taken: A more invasive, open surgery. The filter may be left in place. Tell a health care provider about: Any allergies you have, including iodine. All medicines you are taking, including vitamins, herbs, eye drops, creams, and over-the-counter medicines. Any problems you or family members have had with anesthetic medicines or with contrast dyes that are used during imaging tests. Any bleeding problems you have. Any surgeries you have had. Any medical conditions you have. Whether you are pregnant or may be pregnant. What are the risks? Generally, this is a safe procedure. However, serious problems may occur, especially if the filter has been in for more than a few months. Possible problems include: Infection. Bleeding. Allergic reactions to medicines or dyes. Damage to the IVC, other blood vessels, or nearby structures. A blood clot or a piece of the filter breaking loose and traveling to the lungs. What happens before the procedure? When to stop eating and drinking Follow instructions from your health care provider about what you may eat and drink before your procedure. These may include: 8 hours before your  procedure Stop eating most foods. Do not eat meat, fried foods, or fatty foods. Eat only light foods, such as toast or crackers. All liquids are okay except energy drinks and alcohol. 6 hours before your procedure Stop eating. Drink only clear liquids, such as water, clear fruit juice, black coffee, plain tea, and sports drinks. Do not drink energy drinks or alcohol. 2 hours before your procedure Stop drinking all liquids. You may be allowed to take medicines with small sips of water. If you do not follow your health care provider's instructions, your procedure may be delayed or canceled. Medicines Ask your health care provider about: Changing or stopping your regular medicines. This is especially important if you are taking diabetes medicines or blood thinners. Taking medicines such as aspirin and ibuprofen. These medicines can thin your blood. Do not take these medicines unless your health care provider tells you to take them. Taking over-the-counter medicines, vitamins, herbs, and supplements. General instructions Do not use any products that contain nicotine or tobacco for at least 4 weeks before the procedure. These products include cigarettes, chewing tobacco, and vaping devices, such as e-cigarettes. If you need help quitting, ask your health care provider. Ask your health care provider: How your surgery site will be marked. What steps will be taken to help prevent infection. These steps may include: Removing hair at the surgery site. Washing skin with a germ-killing soap. Receiving antibiotic medicine. If you will be going home right after the procedure, plan to have a responsible adult: Take you home from the hospital or clinic. You will not be allowed to drive. Care for  you for the time you are told. What happens during the procedure? An IV will be inserted into one of your veins. You will be given one or more of the following: A medicine to help you relax (sedative). A  medicine to numb the area (local anesthetic). A medicine to make you fall asleep (general anesthetic). The procedure will be done through a vein in your groin or neck that leads to the IVC. A small incision will be made over the vein. A long, thin tube (catheter) will be inserted into the vein. The catheter will be moved through your vein and into your IVC. X-rays may be done to help guide the catheter into place. Dye may be injected through the catheter before the X-rays to make the catheter and filter easier to see. When the catheter reaches the filter, a hook (snare) on the end of the catheter may be used to latch on to the filter. In some cases, a grasping instrument (forceps) may be threaded through the catheter to gently grab and remove the filter instead. After the filter has been hooked or grasped, the filter and instruments will be pulled out through the catheter. The catheter will be removed through the insertion site in your skin. Pressure will be placed over your insertion site until bleeding stops. A bandage (dressing) will be placed over the catheter insertion site. The procedure may vary among health care providers and hospitals. What happens after the procedure? Your blood pressure, heart rate, breathing rate, and blood oxygen level will be monitored until you leave the hospital or clinic. You may need to stay in bed (be on bed rest) for a period of time. Your insertion site will be monitored for the first few hours for any signs of bleeding. If you were given a sedative during the procedure, it can affect you for several hours. Do not drive or operate machinery until your health care provider says that it is safe. Summary Inferior vena cava (IVC) filter removal is a procedure to take out a filter that was placed to prevent blood clots from traveling to your lungs. You may have your filter removed when the danger of forming blood clots has passed or when you can take  blood-thinning medicines to prevent blood clots. In some cases, a filter is removed because there is a problem with it. A long, thin tube (catheter) will be inserted through a vein in your neck or groin. Then, the filter will be gently grasped and pulled out through the catheter. Plan to have a responsible adult care for you for the time you are told. This information is not intended to replace advice given to you by your health care provider. Make sure you discuss any questions you have with your health care provider. Document Revised: 11/22/2021 Document Reviewed: 11/22/2021 Elsevier Patient Education  2024 [employer].

## 2024-07-04 NOTE — Telephone Encounter (Signed)
 I spoke with the patient when he called back and per the patient he wanted to be scheduled for the 1st week of October. Patient is on the schedule with Dr. Marea for a IVC filter removal on 08/25/24 with a 6:45 am arrival time to the [facility]. Pre-procedure instructions will be mailed.

## 2024-07-10 ENCOUNTER — Encounter

## 2024-07-10 LAB — CUP PACEART REMOTE DEVICE CHECK
Battery Remaining Longevity: 3 mo
Battery Voltage: 2.69 V
Brady Statistic AP VP Percent: 54.6 %
Brady Statistic AP VS Percent: 0 %
Brady Statistic AS VP Percent: 45.39 %
Brady Statistic AS VS Percent: 0 %
Brady Statistic RA Percent Paced: 54.53 %
Brady Statistic RV Percent Paced: 100 %
Date Time Interrogation Session: 20250821001950
Implantable Lead Connection Status: 753985
Implantable Lead Connection Status: 753985
Implantable Lead Implant Date: 20180723
Implantable Lead Implant Date: 20180723
Implantable Lead Location: 753859
Implantable Lead Location: 753860
Implantable Lead Model: 3830
Implantable Lead Model: 5076
Implantable Pulse Generator Implant Date: 20180723
Lead Channel Impedance Value: 304 Ohm
Lead Channel Impedance Value: 342 Ohm
Lead Channel Impedance Value: 342 Ohm
Lead Channel Impedance Value: 456 Ohm
Lead Channel Pacing Threshold Amplitude: 0.75 V
Lead Channel Pacing Threshold Amplitude: 1.25 V
Lead Channel Pacing Threshold Pulse Width: 0.4 ms
Lead Channel Pacing Threshold Pulse Width: 0.4 ms
Lead Channel Sensing Intrinsic Amplitude: 0.75 mV
Lead Channel Sensing Intrinsic Amplitude: 0.75 mV
Lead Channel Sensing Intrinsic Amplitude: 7.125 mV
Lead Channel Sensing Intrinsic Amplitude: 7.125 mV
Lead Channel Setting Pacing Amplitude: 2 V
Lead Channel Setting Pacing Amplitude: 2.5 V
Lead Channel Setting Pacing Pulse Width: 0.8 ms
Lead Channel Setting Sensing Sensitivity: 2.8 mV
Zone Setting Status: 755011
Zone Setting Status: 755011

## 2024-07-15 NOTE — Progress Notes (Signed)
 Remote pacemaker transmission.

## 2024-07-20 ENCOUNTER — Ambulatory Visit: Payer: Self-pay | Admitting: Cardiology

## 2024-08-04 ENCOUNTER — Other Ambulatory Visit

## 2024-08-04 DIAGNOSIS — N138 Other obstructive and reflux uropathy: Secondary | ICD-10-CM | POA: Diagnosis not present

## 2024-08-04 DIAGNOSIS — N401 Enlarged prostate with lower urinary tract symptoms: Secondary | ICD-10-CM | POA: Diagnosis not present

## 2024-08-05 LAB — PSA: Prostate Specific Ag, Serum: 1.3 ng/mL (ref 0.0–4.0)

## 2024-08-06 ENCOUNTER — Ambulatory Visit: Admitting: Urology

## 2024-08-06 ENCOUNTER — Encounter: Payer: Self-pay | Admitting: Urology

## 2024-08-06 VITALS — BP 91/60 | HR 81 | Ht 70.5 in | Wt 218.6 lb

## 2024-08-06 DIAGNOSIS — N401 Enlarged prostate with lower urinary tract symptoms: Secondary | ICD-10-CM

## 2024-08-06 DIAGNOSIS — C61 Malignant neoplasm of prostate: Secondary | ICD-10-CM

## 2024-08-06 DIAGNOSIS — N184 Chronic kidney disease, stage 4 (severe): Secondary | ICD-10-CM

## 2024-08-06 DIAGNOSIS — N138 Other obstructive and reflux uropathy: Secondary | ICD-10-CM

## 2024-08-06 LAB — BLADDER SCAN AMB NON-IMAGING

## 2024-08-06 NOTE — Progress Notes (Signed)
   08/06/2024 2:01 PM   Sean [name]. 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, failed multiple voiding trials Opted for HOLEP 04/04/2024  Preop PSA with Foley in place was 8.16  Physical Exam: BP 91/60 (BP Location: Right Arm, Patient Position: Sitting, Cuff Size: Large)   Pulse 81   Ht 5' 10.5 (1.791 m)   Wt 218 lb 9.6 oz (99.2 kg)   BMI 30.92 kg/m    Imaging/labs: Surgical pathology from [facility] May 2025 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

## 2024-08-07 ENCOUNTER — Ambulatory Visit: Admitting: Urology

## 2024-08-07 ENCOUNTER — Encounter

## 2024-08-11 ENCOUNTER — Ambulatory Visit (INDEPENDENT_AMBULATORY_CARE_PROVIDER_SITE_OTHER)

## 2024-08-11 DIAGNOSIS — I255 Ischemic cardiomyopathy: Secondary | ICD-10-CM | POA: Diagnosis not present

## 2024-08-11 LAB — CUP PACEART REMOTE DEVICE CHECK
Battery Remaining Longevity: 2 mo
Battery Voltage: 2.67 V
Brady Statistic AP VP Percent: 59.26 %
Brady Statistic AP VS Percent: 0 %
Brady Statistic AS VP Percent: 40.73 %
Brady Statistic AS VS Percent: 0.01 %
Brady Statistic RA Percent Paced: 59.12 %
Brady Statistic RV Percent Paced: 99.99 %
Date Time Interrogation Session: 20250922002024
Implantable Lead Connection Status: 753985
Implantable Lead Connection Status: 753985
Implantable Lead Implant Date: 20180723
Implantable Lead Implant Date: 20180723
Implantable Lead Location: 753859
Implantable Lead Location: 753860
Implantable Lead Model: 3830
Implantable Lead Model: 5076
Implantable Pulse Generator Implant Date: 20180723
Lead Channel Impedance Value: 304 Ohm
Lead Channel Impedance Value: 342 Ohm
Lead Channel Impedance Value: 361 Ohm
Lead Channel Impedance Value: 456 Ohm
Lead Channel Pacing Threshold Amplitude: 0.75 V
Lead Channel Pacing Threshold Amplitude: 1.25 V
Lead Channel Pacing Threshold Pulse Width: 0.4 ms
Lead Channel Pacing Threshold Pulse Width: 0.4 ms
Lead Channel Sensing Intrinsic Amplitude: 0.75 mV
Lead Channel Sensing Intrinsic Amplitude: 0.75 mV
Lead Channel Sensing Intrinsic Amplitude: 7.125 mV
Lead Channel Sensing Intrinsic Amplitude: 7.125 mV
Lead Channel Setting Pacing Amplitude: 2 V
Lead Channel Setting Pacing Amplitude: 2.5 V
Lead Channel Setting Pacing Pulse Width: 0.8 ms
Lead Channel Setting Sensing Sensitivity: 2.8 mV
Zone Setting Status: 755011
Zone Setting Status: 755011

## 2024-08-13 ENCOUNTER — Encounter

## 2024-08-13 DIAGNOSIS — E1122 Type 2 diabetes mellitus with diabetic chronic kidney disease: Secondary | ICD-10-CM | POA: Diagnosis not present

## 2024-08-13 DIAGNOSIS — N184 Chronic kidney disease, stage 4 (severe): Secondary | ICD-10-CM | POA: Diagnosis not present

## 2024-08-13 DIAGNOSIS — I10 Essential (primary) hypertension: Secondary | ICD-10-CM | POA: Diagnosis not present

## 2024-08-13 DIAGNOSIS — E785 Hyperlipidemia, unspecified: Secondary | ICD-10-CM | POA: Diagnosis not present

## 2024-08-13 NOTE — Progress Notes (Deleted)
 Remote {Blank single:19197::PPM,ICD,Loop Recorder} Transmission

## 2024-08-13 NOTE — Progress Notes (Signed)
 Remote PPM Transmission

## 2024-08-16 ENCOUNTER — Ambulatory Visit: Payer: Self-pay | Admitting: Cardiology

## 2024-08-20 DIAGNOSIS — Z0000 Encounter for general adult medical examination without abnormal findings: Secondary | ICD-10-CM | POA: Diagnosis not present

## 2024-08-20 DIAGNOSIS — E1159 Type 2 diabetes mellitus with other circulatory complications: Secondary | ICD-10-CM | POA: Diagnosis not present

## 2024-08-20 DIAGNOSIS — I10 Essential (primary) hypertension: Secondary | ICD-10-CM | POA: Diagnosis not present

## 2024-08-20 DIAGNOSIS — Z1331 Encounter for screening for depression: Secondary | ICD-10-CM | POA: Diagnosis not present

## 2024-08-20 DIAGNOSIS — D631 Anemia in chronic kidney disease: Secondary | ICD-10-CM | POA: Diagnosis not present

## 2024-08-20 DIAGNOSIS — E785 Hyperlipidemia, unspecified: Secondary | ICD-10-CM | POA: Diagnosis not present

## 2024-08-20 DIAGNOSIS — I2510 Atherosclerotic heart disease of native coronary artery without angina pectoris: Secondary | ICD-10-CM | POA: Diagnosis not present

## 2024-08-20 DIAGNOSIS — N184 Chronic kidney disease, stage 4 (severe): Secondary | ICD-10-CM | POA: Diagnosis not present

## 2024-08-20 DIAGNOSIS — N400 Enlarged prostate without lower urinary tract symptoms: Secondary | ICD-10-CM | POA: Diagnosis not present

## 2024-08-25 ENCOUNTER — Ambulatory Visit
Admission: RE | Admit: 2024-08-25 | Discharge: 2024-08-25 | Disposition: A | Discharge status: Home / Self Care | Attending: Vascular Surgery | Admitting: Vascular Surgery

## 2024-08-25 ENCOUNTER — Encounter
Admission: RE | Disposition: A | Discharge status: Home / Self Care | Payer: Self-pay | Source: Home / Self Care | Attending: Vascular Surgery

## 2024-08-25 ENCOUNTER — Encounter: Payer: Self-pay | Admitting: Vascular Surgery

## 2024-08-25 ENCOUNTER — Other Ambulatory Visit: Payer: Self-pay

## 2024-08-25 DIAGNOSIS — N183 Chronic kidney disease, stage 3 unspecified: Secondary | ICD-10-CM | POA: Insufficient documentation

## 2024-08-25 DIAGNOSIS — Z4589 Encounter for adjustment and management of other implanted devices: Secondary | ICD-10-CM

## 2024-08-25 DIAGNOSIS — Z7901 Long term (current) use of anticoagulants: Secondary | ICD-10-CM

## 2024-08-25 DIAGNOSIS — I130 Hypertensive heart and chronic kidney disease with heart failure and stage 1 through stage 4 chronic kidney disease, or unspecified chronic kidney disease: Secondary | ICD-10-CM | POA: Insufficient documentation

## 2024-08-25 DIAGNOSIS — E785 Hyperlipidemia, unspecified: Secondary | ICD-10-CM | POA: Diagnosis not present

## 2024-08-25 DIAGNOSIS — I5022 Chronic systolic (congestive) heart failure: Secondary | ICD-10-CM | POA: Diagnosis not present

## 2024-08-25 DIAGNOSIS — I82409 Acute embolism and thrombosis of unspecified deep veins of unspecified lower extremity: Secondary | ICD-10-CM

## 2024-08-25 DIAGNOSIS — Z79899 Other long term (current) drug therapy: Secondary | ICD-10-CM | POA: Diagnosis not present

## 2024-08-25 DIAGNOSIS — Z86718 Personal history of other venous thrombosis and embolism: Secondary | ICD-10-CM | POA: Insufficient documentation

## 2024-08-25 HISTORY — PX: IVC FILTER REMOVAL: CATH118246

## 2024-08-25 SURGERY — IVC FILTER REMOVAL
Anesthesia: Moderate Sedation

## 2024-08-25 MED ORDER — CEFAZOLIN SODIUM-DEXTROSE 2-4 GM/100ML-% IV SOLN
INTRAVENOUS | Status: AC
  Filled 2024-08-25: qty 100

## 2024-08-25 MED ORDER — CEFAZOLIN SODIUM-DEXTROSE 2-4 GM/100ML-% IV SOLN: 2.0000 g | INTRAVENOUS | Status: DC

## 2024-08-25 MED ORDER — ONDANSETRON HCL 4 MG/2ML IJ SOLN: 4.0000 mg | Freq: Four times a day (QID) | INTRAMUSCULAR | Status: DC | PRN

## 2024-08-25 MED ORDER — DIPHENHYDRAMINE HCL 50 MG/ML IJ SOLN: 50.0000 mg | Freq: Once | INTRAMUSCULAR | Status: DC | PRN

## 2024-08-25 MED ORDER — LIDOCAINE-EPINEPHRINE (PF) 1 %-1:200000 IJ SOLN
INTRAMUSCULAR | Status: DC | PRN
  Administered 2024-08-25: 10 mL

## 2024-08-25 MED ORDER — HEPARIN (PORCINE) IN NACL 1000-0.9 UT/500ML-% IV SOLN
INTRAVENOUS | Status: DC | PRN
  Administered 2024-08-25: 500 mL

## 2024-08-25 MED ORDER — FENTANYL CITRATE (PF) 100 MCG/2ML IJ SOLN
INTRAMUSCULAR | Status: DC | PRN
  Administered 2024-08-25: 50 ug via INTRAVENOUS

## 2024-08-25 MED ORDER — IPRATROPIUM-ALBUTEROL 0.5-2.5 (3) MG/3ML IN SOLN
3.0000 mL | Freq: Once | RESPIRATORY_TRACT | Status: AC
  Administered 2024-08-25: 3 mL via RESPIRATORY_TRACT

## 2024-08-25 MED ORDER — SODIUM CHLORIDE 0.9 % IV SOLN: INTRAVENOUS | Status: DC

## 2024-08-25 MED ORDER — FENTANYL CITRATE (PF) 100 MCG/2ML IJ SOLN
INTRAMUSCULAR | Status: AC
  Filled 2024-08-25: qty 2

## 2024-08-25 MED ORDER — MIDAZOLAM HCL 2 MG/2ML IJ SOLN
INTRAMUSCULAR | Status: DC | PRN
  Administered 2024-08-25: 1 mg via INTRAVENOUS

## 2024-08-25 MED ORDER — MIDAZOLAM HCL 2 MG/ML PO SYRP: 8.0000 mg | ORAL_SOLUTION | Freq: Once | ORAL | Status: DC | PRN

## 2024-08-25 MED ORDER — FAMOTIDINE 20 MG PO TABS: 40.0000 mg | ORAL_TABLET | Freq: Once | ORAL | Status: DC | PRN

## 2024-08-25 MED ORDER — HYDROMORPHONE HCL 1 MG/ML IJ SOLN: 1.0000 mg | Freq: Once | INTRAMUSCULAR | Status: DC | PRN

## 2024-08-25 MED ORDER — MIDAZOLAM HCL 2 MG/2ML IJ SOLN
INTRAMUSCULAR | Status: AC
  Filled 2024-08-25: qty 2

## 2024-08-25 MED ORDER — IODIXANOL 320 MG/ML IV SOLN
INTRAVENOUS | Status: DC | PRN
  Administered 2024-08-25: 10 mL

## 2024-08-25 MED ORDER — METHYLPREDNISOLONE SODIUM SUCC 125 MG IJ SOLR: 125.0000 mg | Freq: Once | INTRAMUSCULAR | Status: DC | PRN

## 2024-08-25 MED ORDER — IPRATROPIUM-ALBUTEROL 0.5-2.5 (3) MG/3ML IN SOLN
RESPIRATORY_TRACT | Status: AC
  Filled 2024-08-25: qty 3

## 2024-08-25 SURGICAL SUPPLY — 7 items
COVER PROBE ULTRASOUND 5X96 (MISCELLANEOUS) IMPLANT
KIT MICROPUNCTURE VSI 5F STIFF (SHEATH) IMPLANT
KIT SNARE RETRIEVAL 3-LOOP (MISCELLANEOUS) IMPLANT
PACK ANGIOGRAPHY (CUSTOM PROCEDURE TRAY) ×1 IMPLANT
SHEATH MICROPUNCTURE PEDAL 4FR (SHEATH) IMPLANT
SUT MNCRL AB 4-0 PS2 18 (SUTURE) IMPLANT
WIRE J 3MM .035X145CM (WIRE) IMPLANT

## 2024-08-25 NOTE — Op Note (Signed)
 [facility]   OPERATIVE NOTE    PRE-OPERATIVE DIAGNOSIS:  1. DVT 2. status post IVC filter placement  POST-OPERATIVE DIAGNOSIS: Same as above  PROCEDURE: 1.   Ultrasound guidance for vascular access right jugular vein 2.   Catheter placement into inferior vena cava from right jugular vein 3.   Inferior venacavogram 4.   Retrieval of Option Elite IVC filter  SURGEON: Selinda Gu, MD  ASSISTANT(S): None  ANESTHESIA: Local with moderate conscious sedation for approximately 13 minutes using 1 mg of Versed  and 50 mcg of Fentanyl   ESTIMATED BLOOD LOSS: 3 cc  CONTRAST:  15 cc  FLUORO TIME:  0.8 minutes  FINDING(S): 1.  patent IVC   SPECIMEN(S):  IVC filter  INDICATIONS:    Patient is a 83 y.o. male who presents with a previous history of IVC filter placement. Patient has completed full therapy and in on prophylactic Eliquis  and no longer needs this filter. The patient remains on anticoagulation. Risks and benefits were discussed, and informed consent was obtained.  DESCRIPTION: After obtaining full informed written consent, the patient was brought back to the vascular suite and placed supine upon the table. Moderate conscious sedation was administered during a face to face encounter with the patient throughout the procedure with my supervision of the RN administering medicines and monitoring the patient's vital signs, pulse oximetry, telemetry and mental status throughout from the start of the procedure until the patient was taken to the recovery room.  After obtaining adequate anesthesia, the patient was prepped and draped in the standard fashion.  The right jugular vein was visualized with ultrasound and found to be widely patent. It was then accessed under direct ultrasound guidance without difficulty with the Seldinger needle and a permanent image was recorded. A J-wire was placed. After skin nick and dilatation, the retrieval sheath was placed over the wire  and advanced into the inferior vena cava. Inferior vena cava was imaged and found to be widely patent on inferior venacavogram. The filter was straight in its orientation. The retrieval snare was then placed through the sheath and the hook of the filter was snared without difficulty. The sheath was then advanced, and the filter was collapsed and brought into the sheath in its entirety. It was then removed from the body in its entirety. The retrieval sheath was then removed. Pressure was held at the access site and sterile dressing was placed. The patient was taken to the recovery room in stable condition having tolerated the procedure well.  COMPLICATIONS: None  CONDITION:  Stable   Selinda Gu 08/25/2024 8:13 AM  This note was created with Dragon Medical transcription system. Any errors in dictation are purely unintentional.

## 2024-08-25 NOTE — H&P (Signed)
 [facility] Admission History & Physical  MRN : [id number]  Sean [name]. is a 83 y.o. ([birth year]) male who presents with chief complaint of No chief complaint on file. [name]  History of Present Illness: Patient returns today in follow up of DVT and previous IVC filter for planned removal.  He is doing well.  He has no current chest pain or shortness of breath.  He has mild occasional leg swelling but nothing concerning or bothersome.  He had an extensive right lower extremity DVT treated with thrombectomy and filter placement about a year ago.  He did not have his filter removed initially due to other upcoming procedures and potential need for cessation of anticoagulation.  He is on a prophylactic dose of Eliquis  at this point   Current Facility-Administered Medications  Medication Dose Route Frequency Provider Last Rate Last Admin   0.9 %  sodium chloride  infusion   Intravenous Continuous Brown, Fallon E, NP 75 mL/hr at 08/25/24 0705 New Bag at 08/25/24 0705   diphenhydrAMINE  (BENADRYL ) injection 50 mg  50 mg Intravenous Once PRN Brown, Fallon E, NP       famotidine  (PEPCID ) tablet 40 mg  40 mg Oral Once PRN Brown, Fallon E, NP       HYDROmorphone  (DILAUDID ) injection 1 mg  1 mg Intravenous Once PRN Brown, Fallon E, NP       ipratropium-albuterol  (DUONEB) 0.5-2.5 (3) MG/3ML nebulizer solution            methylPREDNISolone  sodium succinate (SOLU-MEDROL ) 125 mg/2 mL injection 125 mg  125 mg Intravenous Once PRN Brown, Fallon E, NP       midazolam  (VERSED ) 2 MG/ML syrup 8 mg  8 mg Oral Once PRN Brown, Fallon E, NP       ondansetron  (ZOFRAN ) injection 4 mg  4 mg Intravenous Q6H PRN Brown, Fallon E, NP        Past Medical History:  Diagnosis Date   Acute encephalopathy 01/25/2024   Anemia    Aortic atherosclerosis    Arthritis    Asthma    BPH with urinary obstruction    CAD (coronary artery disease)    a. LHC 7/19: ostLM 30%, ostLAD 30%, p-mLAD 99% mod  calcified, ostD1 90%, ost-pLCx 80% ulcerative & mod calcified, p-mLCx 50%, OM2 50%, mod dz LPDA, RCA small w/ diffuse dz throughout; b. recommendation of CABG; c. 3-V CABG 08/26/18 (LIMA-LAD, VG-Diag, VG-LCx)   Cerebral microvascular disease    CKD (chronic kidney disease), stage III (HCC)    Complete heart block (HCC)    a.) symptomatic SB in setting of CHB --> dual chamber MDT PPM placed 06/11/2017   COPD (chronic obstructive pulmonary disease) (HCC)    CVA (cerebral vascular accident) (HCC)    a.) noted on CT head 01/25/2024: remote infarct involving the head of the right caudate and anterior limb of the right internal capsule   Deep vein thrombosis (DVT) of upper extremity (HCC) 06/17/2018   a.) LEFT subclavian --> Tx'd with apixaban    DVT, lower extremity, proximal, acute (HCC)    Dyslipidemia    GERD (gastroesophageal reflux disease)    Gout    Hemothorax on left    HFrEF (heart failure with reduced ejection fraction) (HCC)    History of pulmonary embolus (PE)    HOH (hard of hearing)    Hypertension    Ischemic cardiomyopathy    Loculated pleural effusion (LEFT)    a.) s/p VATS 09/24/2018  Multiple renal cysts    Obesity    On apixaban  therapy    Paroxysmal atrial flutter (HCC)    a.) CHA2DS2VASc = 7 (age x2, HFrEF, HTN, PE/DVT x 2, vascular disease) as of 04/01/2024; b.) s/p RFCA 03/03/2020; c.) rate/rhythm maintained intrinsically without pharmacological intervention; chronically anticoagulated with apixaban    Pneumonia 2000   Pre-diabetes    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 Harvested endoscopically.;  Surgeon: Fleeta Hanford Coy, MD;  Location: [facility] OR;  Service: Open Heart Surgery;  Laterality: N/A;   EPICARDIAL PACING LEAD PLACEMENT N/A 08/26/2018   Procedure: LV PACING LEAD PLACEMENT;  Surgeon: Fleeta Hanford Coy, MD;  Location: [facility] OR;  Service: Thoracic;  Laterality: N/A;   EPICARDIAL PACING LEAD PLACEMENT Left 11/07/2018   Procedure: REVISE PACEMAKER LEAD LEFT CHEST;  Surgeon: Fleeta Hanford Coy, MD;  Location: [facility] OR;  Service: Thoracic;  Laterality: Left;   HOLEP-LASER ENUCLEATION OF THE PROSTATE WITH MORCELLATION N/A 04/04/2024   Procedure: ENUCLEATION, PROSTATE, USING LASER, WITH MORCELLATION;  Surgeon: Francisca Redell BROCKS, MD;  Location: [facility] ORS;  Service: Urology;  Laterality: N/A;   IVC FILTER INSERTION     LEFT HEART CATH AND CORONARY ANGIOGRAPHY N/A 06/13/2018   Procedure: LEFT HEART CATH AND CORONARY ANGIOGRAPHY;  Surgeon: Darron Deatrice LABOR, MD;  Location: [facility] INVASIVE CV LAB;  Service: Cardiovascular;  Laterality: N/A;   LEG SURGERY     LUMBAR DISC SURGERY     PACEMAKER IMPLANT N/A 06/11/2017   Procedure: Pacemaker Implant;  Surgeon: Waddell Danelle ORN, MD;  Location: [facility] INVASIVE CV LAB;  Service: Cardiovascular;  Laterality: N/A;   PERIPHERAL VASCULAR THROMBECTOMY Right 06/13/2023   Procedure: PERIPHERAL VASCULAR THROMBECTOMY;  Surgeon: Marea Selinda RAMAN, MD;  Location: [facility] INVASIVE CV LAB;  Service: Cardiovascular;  Laterality: Right;   PLEURAL EFFUSION DRAINAGE Left 09/24/2018   Procedure: DRAINAGE OF LOCULATED PLEURAL EFFUSION;  Surgeon: Fleeta Hanford Coy, MD;  Location[facility] OR;  Service: Thoracic;  Laterality: Left;   TEE WITHOUT CARDIOVERSION N/A 08/26/2018   Procedure: TRANSESOPHAGEAL ECHOCARDIOGRAM (TEE);  Surgeon: Fleeta Hanford,  Coy, MD;  Location: [facility] OR;  Service: Open Heart Surgery;  Laterality: N/A;   TEE WITHOUT CARDIOVERSION  03/03/2020   Procedure: Transesophageal Echocardiogram (Tee);  Surgeon: Fernande Elspeth BROCKS, MD;  Location: [facility] INVASIVE CV LAB;  Service: Cardiovascular;;   VIDEO ASSISTED THORACOSCOPY Left 09/24/2018   Procedure: VIDEO ASSISTED THORACOSCOPY;  Surgeon: Fleeta Hanford, Coy, MD;  Location: [facility] OR;  Service: Thoracic;  Laterality: Left;     Social History   Tobacco Use   Smoking status: Never   Smokeless tobacco: Never  Vaping Use   Vaping status: Never Used  Substance Use Topics   Alcohol use: Yes    Comment: rare   Drug use: No     Family History  Problem Relation Age of Onset   Stroke  Mother    Stroke Father    Alcohol abuse Father    Diabetes Brother    Alcohol abuse Brother    Stroke Maternal Grandfather     Allergies  Allergen Reactions   Azithromycin Hives   Erythromycin Hives    REVIEW OF SYSTEMS (Negative unless checked)   Constitutional: [] Weight loss  [] Fever  [] Chills Cardiac: [] Chest pain   [] Chest pressure   [x] Palpitations   [] Shortness of breath when laying flat   [] Shortness of breath at rest   [] Shortness of breath with exertion. Vascular:  [] Pain in legs with walking   [] Pain in legs at rest   [] Pain in legs when laying flat   [] Claudication   [] Pain in feet when walking  [] Pain in feet at rest  [] Pain in feet when laying flat   [x] History of DVT   [x] Phlebitis   [x] Swelling in legs   [] Varicose veins   [] Non-healing ulcers Pulmonary:   [] Uses home oxygen   [] Productive cough   [] Hemoptysis   [] Wheeze  [] COPD   [x] Asthma Neurologic:  [] Dizziness  [] Blackouts   [] Seizures   [] History of stroke   [] History of TIA  [] Aphasia   [] Temporary blindness   [] Dysphagia   [] Weakness or numbness in arms   [] Weakness or numbness in legs Musculoskeletal:  [x] Arthritis   [] Joint swelling   [x] Joint pain   [] Low back pain Hematologic:  [] Easy bruising  [] Easy bleeding    [] Hypercoagulable state   [] Anemic   Gastrointestinal:  [] Blood in stool   [] Vomiting blood  [x] Gastroesophageal reflux/heartburn   [] Abdominal pain Genitourinary:  [] Chronic kidney disease   [] Difficult urination  [] Frequent urination  [] Burning with urination   [] Hematuria Skin:  [] Rashes   [] Ulcers   [] Wounds Psychological:  [] History of anxiety   []  History of major depression.  Physical Examination  Vitals:   08/25/24 0701  BP: (!) 156/93  Pulse: 74  Temp: (!) 97.5 F (36.4 C)  TempSrc: Temporal  SpO2: 96%  Weight: 104.2 kg  Height: 5' 10.5 (1.791 m)   Body mass index is 32.51 kg/m. Gen: WD/WN, NAD Head: Page/AT, No temporalis wasting.  Ear/Nose/Throat: Hearing grossly intact, nares w/o erythema or drainage, oropharynx w/o Erythema/Exudate,  Eyes: Conjunctiva clear, sclera non-icteric Neck: Trachea midline.  No JVD.  Pulmonary:  Good air movement, respirations not labored, no use of accessory muscles.  Cardiac: RRR, normal S1, S2. Vascular:  Vessel Right Left  Radial Palpable Palpable                    Musculoskeletal: M/S 5/5 throughout.  Extremities without ischemic changes.  No deformity or atrophy.  Neurologic: Sensation grossly intact in extremities.  Symmetrical.  Speech is fluent. Motor exam as listed above. Psychiatric: Judgment intact, Mood & affect appropriate for pt's clinical situation. Dermatologic: No rashes or ulcers noted.  No cellulitis or open wounds.      CBC Lab Results  Component Value Date   WBC 16.9 (H) 02/02/2024   HGB 10.4 (L) 02/02/2024   HCT 30.9 (L) 02/02/2024   MCV 96.6 02/02/2024   PLT 243 02/02/2024    BMET    Component Value Date/Time   NA 137 02/02/2024 0523   NA 142 01/05/2022 1316   NA 141 08/14/2014 0418   K 4.4 02/02/2024 0523   K 3.6 08/14/2014 0418   CL 108 02/02/2024 0523   CL 106 08/14/2014 0418   CO2 23 02/02/2024 0523   CO2 29 08/14/2014 0418  GLUCOSE 127 (H) 02/02/2024 0523   GLUCOSE 106 (H)  08/14/2014 0418   BUN 52 (H) 02/02/2024 0523   BUN 27 01/05/2022 1316   BUN 30 (H) 08/14/2014 0418   CREATININE 3.07 (H) 02/02/2024 0523   CREATININE 1.18 08/14/2014 1227   CALCIUM  7.7 (L) 02/02/2024 0523   CALCIUM  8.3 (L) 08/14/2014 0418   GFRNONAA 20 (L) 02/02/2024 0523   GFRNONAA >60 08/14/2014 1227   GFRAA 47 (L) 03/01/2020 0859   GFRAA >60 08/14/2014 1227   CrCl cannot be calculated (Patient's most recent lab result is older than the maximum 21 days allowed.).  COAG Lab Results  Component Value Date   INR 1.2 06/12/2023   INR 2.2 (H) 03/03/2020   INR 1.6 (H) 03/01/2020    Radiology CUP PACEART REMOTE DEVICE CHECK Result Date: 08/11/2024 PPM Scheduled remote reviewed. Normal device function.  Presenting rhythm:  AS-VP, occasional AP.  Battery estimated 2 months remaining longevity.  2 AHR detections, longest 20 minutes, EGMs consistent with AFL, known history and on Eliquis  per Epic. Next remote 91 days. - CS, CVRS PPM Scheduled remote reviewed. Normal device function.  Presenting rhythm:  AS-VP, occasional AP.  Battery estimated 2 months remaining longevity.  2 AHR detections, longest 20 minutes, EGMs consistent with AFL, known history and on Eliquis  per Epic. Next remote 31 days.  Monthly battery checks per IFU protocol. - CS, CVRS    Assessment/Plan  DVT, lower extremity, proximal, acute, right (HCC) Did well with thrombectomy and filter placement.  On prophylactic dose of Eliquis .  Filter can now be removed.   S/P IVC filter Plan removal today. Risks and benefits are discussed  Essential hypertension blood pressure control important in reducing the progression of atherosclerotic disease. On appropriate oral medications.     Dyslipidemia lipid control important in reducing the progression of atherosclerotic disease. Continue statin therapy  Selinda Gu, MD  08/25/2024 7:30 AM

## 2024-09-09 DIAGNOSIS — N184 Chronic kidney disease, stage 4 (severe): Secondary | ICD-10-CM | POA: Diagnosis not present

## 2024-09-09 DIAGNOSIS — D631 Anemia in chronic kidney disease: Secondary | ICD-10-CM | POA: Diagnosis not present

## 2024-09-09 DIAGNOSIS — I129 Hypertensive chronic kidney disease with stage 1 through stage 4 chronic kidney disease, or unspecified chronic kidney disease: Secondary | ICD-10-CM | POA: Diagnosis not present

## 2024-09-09 DIAGNOSIS — E875 Hyperkalemia: Secondary | ICD-10-CM | POA: Diagnosis not present

## 2024-09-09 DIAGNOSIS — N189 Chronic kidney disease, unspecified: Secondary | ICD-10-CM | POA: Diagnosis not present

## 2024-09-09 DIAGNOSIS — N281 Cyst of kidney, acquired: Secondary | ICD-10-CM | POA: Diagnosis not present

## 2024-09-11 ENCOUNTER — Ambulatory Visit

## 2024-09-11 DIAGNOSIS — N184 Chronic kidney disease, stage 4 (severe): Secondary | ICD-10-CM | POA: Diagnosis not present

## 2024-09-11 DIAGNOSIS — I129 Hypertensive chronic kidney disease with stage 1 through stage 4 chronic kidney disease, or unspecified chronic kidney disease: Secondary | ICD-10-CM | POA: Diagnosis not present

## 2024-09-11 DIAGNOSIS — N2581 Secondary hyperparathyroidism of renal origin: Secondary | ICD-10-CM | POA: Diagnosis not present

## 2024-09-11 DIAGNOSIS — N281 Cyst of kidney, acquired: Secondary | ICD-10-CM | POA: Diagnosis not present

## 2024-09-11 DIAGNOSIS — N189 Chronic kidney disease, unspecified: Secondary | ICD-10-CM | POA: Diagnosis not present

## 2024-09-12 LAB — CUP PACEART REMOTE DEVICE CHECK
Battery Remaining Longevity: 1 mo
Battery Voltage: 2.65 V
Brady Statistic AP VP Percent: 58.46 %
Brady Statistic AP VS Percent: 0 %
Brady Statistic AS VP Percent: 41.53 %
Brady Statistic AS VS Percent: 0.01 %
Brady Statistic RA Percent Paced: 58.35 %
Brady Statistic RV Percent Paced: 99.99 %
Date Time Interrogation Session: 20251023001907
Implantable Lead Connection Status: 753985
Implantable Lead Connection Status: 753985
Implantable Lead Implant Date: 20180723
Implantable Lead Implant Date: 20180723
Implantable Lead Location: 753859
Implantable Lead Location: 753860
Implantable Lead Model: 3830
Implantable Lead Model: 5076
Implantable Pulse Generator Implant Date: 20180723
Lead Channel Impedance Value: 323 Ohm
Lead Channel Impedance Value: 361 Ohm
Lead Channel Impedance Value: 361 Ohm
Lead Channel Impedance Value: 475 Ohm
Lead Channel Pacing Threshold Amplitude: 0.625 V
Lead Channel Pacing Threshold Amplitude: 1.25 V
Lead Channel Pacing Threshold Pulse Width: 0.4 ms
Lead Channel Pacing Threshold Pulse Width: 0.4 ms
Lead Channel Sensing Intrinsic Amplitude: 0.875 mV
Lead Channel Sensing Intrinsic Amplitude: 0.875 mV
Lead Channel Sensing Intrinsic Amplitude: 7.125 mV
Lead Channel Sensing Intrinsic Amplitude: 7.125 mV
Lead Channel Setting Pacing Amplitude: 2 V
Lead Channel Setting Pacing Amplitude: 2.5 V
Lead Channel Setting Pacing Pulse Width: 0.8 ms
Lead Channel Setting Sensing Sensitivity: 2.8 mV
Zone Setting Status: 755011
Zone Setting Status: 755011

## 2024-09-13 ENCOUNTER — Ambulatory Visit: Payer: Self-pay | Admitting: Cardiology

## 2024-09-14 NOTE — Progress Notes (Deleted)
 Electrophysiology Clinic Note    Date:  09/14/2024  Patient ID:  Sean Pontillo., DOB February 22, 1941, MRN [id number] PCP:  [name], MD  Cardiologist:  Deatrice Cage, MD  Electrophysiologist:  Fonda Kitty, MD  Electrophysiology APP:  Jessicah Croll, NP    ***refresh  Discussed the use of AI scribe software for clinical note transcription with the patient, who gave verbal consent to proceed.   Patient Profile    Chief Complaint: ***  History of Present Illness: Sean Blades. is a 83 y.o. male with PMH notable for parox AFib, aflutter, CHB s/p PPM, HFmrEF, CAD s/p CABG, CKD -3, HTN, T2DM, DVT/PE; seen today for Fonda Kitty, MD for routine electrophysiology followup.   He is s/p typical aflutter CTI ablation 2021.  He last saw Dr. Kitty 02/2024 for pre-op visit prior ot laser prostate procedure. Was overall doing well able to walk up steps in home without limitations. Updated TTE showed LVEF ~40%.   His battery is nearly at [facility], last remote had estimate of < 1 month.   On follow-up today, *** AF burden, symptoms *** palpitations *** bleeding concerns   Since last being seen in our clinic the patient reports doing ***.  he denies chest pain, palpitations, dyspnea, PND, orthopnea, nausea, vomiting, dizziness, syncope, edema, weight gain, or early satiety.      Arrhythmia/Device History Medtronic - PPM - [employer] BUNDLE OF HIS  04/09/18- patient gave verbal permission to nurse- ok to speak with his wife, Sean [name]- he will sign a DPR at next cardiology office visit.    ROS:  Please see the history of present illness. All other systems are reviewed and otherwise negative.    Physical Exam    VS:  There were no vitals taken for this visit. BMI: There is no height or weight on file to calculate BMI.           Wt Readings from Last 3 Encounters:  08/25/24 229 lb 12.8 oz (104.2 kg)  08/06/24 218 lb 9.6 oz (99.2 kg)  06/24/24 224 lb (101.6 kg)      GEN- The patient is well appearing, alert and oriented x 3 today.   Lungs- Clear to ausculation bilaterally, normal work of breathing.  Heart- {Blank single:19197::Regular,Irregularly irregular} rate and rhythm, no murmurs, rubs or gallops Extremities- {EDEMA LEVEL:28147::No} peripheral edema, warm, dry Skin-  *** device pocket well-healed, no tethering   *** Brief check performed without iterative lead testing/measurements   Device interrogation done today and reviewed by myself:  Battery *** Lead thresholds, impedence, sensing stable *** *** episodes *** changes made today   Studies Reviewed   Previous EP, cardiology notes.    EKG {ACTION; IS/IS WNU:78978602} ordered. Personal review of EKG from {Blank single:19197::today,***} shows:  ***        TTE, 03/25/2024  1. Definity contrast was not used. The Apical segments are not well  defined. There is probably severe hypokinesis or akinesis of the apical  anterior septum and apical inferior septum, but with relatively preserved  anterior wall motion. There is severe  mid basal inferior wall hypokinesis. Lateral wall motion appears normal.  Left ventricular ejection fraction, by estimation, is 40%. The left  ventricle has mildly decreased function. The left ventricle demonstrates  regional wall motion abnormalities (see  scoring diagram/findings for description). There is mild concentric left  ventricular hypertrophy. Left ventricular diastolic parameters are  consistent with Grade I diastolic dysfunction (impaired relaxation).  2. Right ventricular systolic function is mildly reduced. The right  ventricular size is normal. Tricuspid regurgitation signal is inadequate  for assessing PA pressure.   3. Left atrial size was mildly dilated.   4. Right atrial size was mildly dilated.   5. The mitral valve is normal in structure. No evidence of mitral valve  regurgitation. No evidence of mitral stenosis.   6. The  aortic valve is tricuspid. There is mild calcification of the  aortic valve. Aortic valve regurgitation is not visualized. Aortic valve  sclerosis/calcification is present, without any evidence of aortic  stenosis.   7. The inferior vena cava is normal in size with greater than 50%  respiratory variability, suggesting right atrial pressure of 3 mmHg.   TTE, 06/14/23  1. Left ventricular ejection fraction, by estimation, is 45 to 50%. The  left ventricle has mildly decreased function. The left ventricle  demonstrates regional wall motion abnormalities (hypokinesis of the  anterior, anteroseptal and apical region). Left  ventricular diastolic parameters are consistent with Grade I diastolic  dysfunction (impaired relaxation).   2. Right ventricular systolic function is normal. The right ventricular  size is normal. There is moderately elevated pulmonary artery systolic  pressure. The estimated right ventricular systolic pressure is 54.0 mmHg.   3. Right atrial size was mildly dilated.   4. The mitral valve is normal in structure. No evidence of mitral valve  regurgitation. No evidence of mitral stenosis.   5. The aortic valve has an indeterminant number of cusps. Aortic valve  regurgitation is not visualized. Aortic valve sclerosis is present, with  no evidence of aortic valve stenosis.   6. The inferior vena cava is normal in size with greater than 50%  respiratory variability, suggesting right atrial pressure of 3 mmHg.     Assessment and Plan     #) ***   #) ***   {Are you ordering a CV Procedure (e.g. stress test, cath, DCCV, TEE, etc)?   Press F2        :[id number]}   Current medicines are reviewed at length with the patient today.   The patient {ACTIONS; HAS/DOES NOT HAVE:19233} concerns regarding his medicines.  The following changes were made today:  {NONE DEFAULTED:18576}  Labs/ tests ordered today include: *** No orders of the defined types were placed in this  encounter.    Disposition: Follow up with {EPMDS:28135::EP Team} or EP APP {EPFOLLOW UP:28173}   Signed, Chantal Needle, NP  09/14/24  3:49 PM  Electrophysiology [facility]

## 2024-09-15 ENCOUNTER — Ambulatory Visit: Admitting: Cardiology

## 2024-09-24 ENCOUNTER — Ambulatory Visit: Admitting: Student

## 2024-09-24 NOTE — Progress Notes (Deleted)
  Electrophysiology Office Note:   ID:  Sean Cotten., DOB June 19, 1941, MRN [id number]  Primary Cardiologist: Deatrice Cage, MD Electrophysiologist: Fonda Kitty, MD  {Click to update primary MD,subspecialty MD or APP then REFRESH:1}    History of Present Illness:   Sean Markell. is a 83 y.o. male with h/o parox AFib, aflutter, CHB s/p PPM, HFmrEF, CAD s/p CABG, CKD -3, HTN, T2DM, DVT/PE seen today for routine electrophysiology followup.   Since last being seen in our clinic the patient reports doing ***.  he denies chest pain, palpitations, dyspnea, PND, orthopnea, nausea, vomiting, dizziness, syncope, edema, weight gain, or early satiety.   Review of systems complete and found to be negative unless listed in HPI.   EP Information / Studies Reviewed:    EKG is ordered today. Personal review as below.       PPM Interrogation-  reviewed in detail today,  See PACEART report.  Arrhythmia/Device History Medtronic dual chamber PPM, imp 2018; dx CHB  - HIS bundle lead     Physical Exam:   VS:  There were no vitals taken for this visit.   Wt Readings from Last 3 Encounters:  08/25/24 229 lb 12.8 oz (104.2 kg)  08/06/24 218 lb 9.6 oz (99.2 kg)  06/24/24 224 lb (101.6 kg)     GEN: No acute distress  NECK: No JVD; No carotid bruits CARDIAC: {EPRHYTHM:28826}, no murmurs, rubs, gallops RESPIRATORY:  Clear to auscultation without rales, wheezing or rhonchi  ABDOMEN: Soft, non-tender, non-distended EXTREMITIES:  {EDEMA LEVEL:28147::No} edema; No deformity   ASSESSMENT AND PLAN:    CHB s/p Medtronic PPM  Normal PPM function See Pace Art report No changes today  Paroxysmal AF AFL S/p ablation 02/2020 Continue eliquis  5 mg BID for CHA2DS2/VASc of at least 8  Secondary hypercoagulable state Pt on Eliquis  as above   Chronic systolic CHF   Echo 03/2024   {Click here to Review PMH, Prob List, Meds, Allergies, SHx, FHx  :1}   Disposition:   Follow up with  {EPPROVIDERS:28135::EP Team} {EPFOLLOW UP:28173}  Signed, Ozell Prentice Passey, PA-C

## 2024-10-10 NOTE — Progress Notes (Unsigned)
 Electrophysiology Clinic Note    Date:  10/13/2024  Patient ID:  Sean [name]., DOB 08-30-1941, MRN [id number] PCP:  [name], MD  Cardiologist:  Deatrice Cage, MD  Electrophysiologist:  Sean Kitty, MD  Electrophysiology APP:  Olie Dibert, NP     Discussed the use of AI scribe software for clinical note transcription with the patient, who gave verbal consent to proceed.   Patient Profile    Chief Complaint: PPM follow-up  History of Present Illness: Sean Kinner. is a 84 y.o. male with PMH notable for parox AFib, aflutter, CHB s/p PPM, HFmrEF, CAD s/p CABG, CKD -3, HTN, T2DM, DVT/PE; seen today for Sean Kitty, MD for routine electrophysiology followup.   He is s/p typical aflutter CTI ablation 2021 by Dr. Fernande He last saw Dr. Kitty 02/2024 for pre-op visit prior to laser prostate procedure. Was overall doing well,  able to walk up steps in home without limitations. Updated TTE showed LVEF ~40%.   His battery is nearly at [facility], last remote had estimate of < 1 month.   On follow-up today, he has no acute complaints, states that his energy is not what it was 20 years ago but he is able to do everything he wants to do throughout the day. His wife notes that his energy seems much less. He denies chest pain, chest pressure, palpitations. He has had weight gain, but believes it is d/t over-eating. He denies reduced appetite or bloating.  He continues to take eliquis  BID, no bleeding concerns.       Arrhythmia/Device History Medtronic dual chamber PPM, imp 2018; dx CHB  - HIS bundle lead      ROS:  Please see the history of present illness. All other systems are reviewed and otherwise negative.    Physical Exam    VS:  BP 134/78 (BP Location: Left Arm, Patient Position: Sitting, Cuff Size: Small)   Pulse 79   Ht 5' 10.5 (1.791 m)   Wt 235 lb (106.6 kg)   BMI 33.24 kg/m  BMI: Body mass index is 33.24 kg/m.           Wt Readings from  Last 3 Encounters:  10/13/24 235 lb (106.6 kg)  08/25/24 229 lb 12.8 oz (104.2 kg)  08/06/24 218 lb 9.6 oz (99.2 kg)     GEN- The patient is well appearing, alert and oriented x 3 today.   Lungs- Clear to ausculation bilaterally, normal work of breathing.  Heart- Regular rate and rhythm, no murmurs, rubs or gallops Extremities- Trace peripheral edema, warm, dry Skin-  device pocket well-healed, no tethering  Device interrogation done today and reviewed by myself:  Battery <59mon until ERI Unable to test lead measurements VP 100%  No changes made today   Studies Reviewed   Previous EP, cardiology notes.    EKG is ordered. Personal review of EKG from today shows:    EKG Interpretation Date/Time:  Monday October 13 2024 10:42:25 EST Ventricular Rate:  79 PR Interval:    QRS Duration:  170 QT Interval:  440 QTC Calculation: 504 R Axis:   65  Text Interpretation: Ventricular-paced rhythm When compared with ECG of 20-May-2024 13:14, Vent. rate has increased BY   2 BPM Confirmed by Veleka Djordjevic [phone]) on 10/13/2024 10:45:28 AM    TTE, 03/25/2024  1. Definity contrast was not used. The Apical segments are not well defined. There is probably severe hypokinesis or akinesis of the apical  anterior septum and apical inferior septum, but with relatively preserved anterior wall motion. There is severe mid basal inferior wall hypokinesis. Lateral wall motion appears normal. Left ventricular ejection fraction, by estimation, is 40%. The left ventricle has mildly decreased function. The left ventricle demonstrates regional wall motion abnormalities (see scoring diagram/findings for description). There is mild concentric left ventricular hypertrophy. Left ventricular diastolic parameters are consistent with Grade I diastolic dysfunction (impaired relaxation).   2. Right ventricular systolic function is mildly reduced. The right ventricular size is normal. Tricuspid regurgitation signal is  inadequate for assessing PA pressure.   3. Left atrial size was mildly dilated.   4. Right atrial size was mildly dilated.   5. The mitral valve is normal in structure. No evidence of mitral valve regurgitation. No evidence of mitral stenosis.   6. The aortic valve is tricuspid. There is mild calcification of the aortic valve. Aortic valve regurgitation is not visualized. Aortic valve sclerosis/calcification is present, without any evidence of aortic stenosis.   7. The inferior vena cava is normal in size with greater than 50% respiratory variability, suggesting right atrial pressure of 3 mmHg.   TTE, 06/14/23  1. Left ventricular ejection fraction, by estimation, is 45 to 50%. The left ventricle has mildly decreased function. The left ventricle demonstrates regional wall motion abnormalities (hypokinesis of the anterior, anteroseptal and apical region). Left ventricular diastolic parameters are consistent with Grade I diastolic dysfunction (impaired relaxation).   2. Right ventricular systolic function is normal. The right ventricular size is normal. There is moderately elevated pulmonary artery systolic pressure. The estimated right ventricular systolic pressure is 54.0 mmHg.   3. Right atrial size was mildly dilated.   4. The mitral valve is normal in structure. No evidence of mitral valve regurgitation. No evidence of mitral stenosis.   5. The aortic valve has an indeterminant number of cusps. Aortic valve regurgitation is not visualized. Aortic valve sclerosis is present, with no evidence of aortic valve stenosis.   6. The inferior vena cava is normal in size with greater than 50% respiratory variability, suggesting right atrial pressure of 3 mmHg.   Assessment and Plan     #) CHB s/p PPM Battery < 1 month until ERI Paced QRS VP 100% Discussed device upgrade adding CS or LB area lead. Patient and spouse are agreeable. Advised would need venogram prior to evaluate anatomy.   I  discussed generator change procedure with patient and spouse, who verbalized understanding Continue monthly remote monitoring  Will need CBC, BMP once gen change procedure date is known   #) parox afib, aflutter 5% AF by device Asymptomatic   #) Hypercoag d/t parox afib CHA2DS2-VASc Score = at least 5 [CHF History: 1, HTN History: 1, Diabetes History: 0, Stroke History: 0, Vascular Disease History: 1, Age Score: 2, Gender Score: 0].  Therefore, the patient's annual risk of stroke is 7.2 %.    Stroke ppx - 2.5mg  eliquis  BID, , dose appropriately reduced for age, Cr No bleeding concerns   #) HFmrEF Appears warm and euvolemic on exam with trace lower extremity edema Recommended device upgrade as above      Current medicines are reviewed at length with the patient today.   The patient does not have concerns regarding his medicines.  The following changes were made today:  none  Labs/ tests ordered today include:  Orders Placed This Encounter  Procedures   EKG 12-Lead     Disposition: Follow up with Dr. Kennyth or  EP APP as usual post procedure   Signed, Sheronica Corey, NP  10/13/24  11:42 AM  Electrophysiology [facility]

## 2024-10-12 ENCOUNTER — Ambulatory Visit: Attending: Cardiology

## 2024-10-13 ENCOUNTER — Ambulatory Visit: Payer: Self-pay | Admitting: Cardiology

## 2024-10-13 ENCOUNTER — Encounter

## 2024-10-13 ENCOUNTER — Ambulatory Visit: Attending: Cardiology | Admitting: Cardiology

## 2024-10-13 VITALS — BP 134/78 | HR 79 | Ht 70.5 in | Wt 235.0 lb

## 2024-10-13 DIAGNOSIS — I480 Paroxysmal atrial fibrillation: Secondary | ICD-10-CM

## 2024-10-13 DIAGNOSIS — I442 Atrioventricular block, complete: Secondary | ICD-10-CM | POA: Diagnosis not present

## 2024-10-13 DIAGNOSIS — Z950 Presence of cardiac pacemaker: Secondary | ICD-10-CM

## 2024-10-13 DIAGNOSIS — D6869 Other thrombophilia: Secondary | ICD-10-CM | POA: Diagnosis not present

## 2024-10-13 LAB — CUP PACEART REMOTE DEVICE CHECK
Battery Remaining Longevity: 1 mo — CL
Battery Voltage: 2.63 V
Brady Statistic AP VP Percent: 52.37 %
Brady Statistic AP VS Percent: 0 %
Brady Statistic AS VP Percent: 47.57 %
Brady Statistic AS VS Percent: 0.06 %
Brady Statistic RA Percent Paced: 52.26 %
Brady Statistic RV Percent Paced: 99.94 %
Date Time Interrogation Session: 20251122231808
Implantable Lead Connection Status: 753985
Implantable Lead Connection Status: 753985
Implantable Lead Implant Date: 20180723
Implantable Lead Implant Date: 20180723
Implantable Lead Location: 753859
Implantable Lead Location: 753860
Implantable Lead Model: 3830
Implantable Lead Model: 5076
Implantable Pulse Generator Implant Date: 20180723
Lead Channel Impedance Value: 342 Ohm
Lead Channel Impedance Value: 361 Ohm
Lead Channel Impedance Value: 380 Ohm
Lead Channel Impedance Value: 475 Ohm
Lead Channel Pacing Threshold Amplitude: 0.625 V
Lead Channel Pacing Threshold Amplitude: 1.25 V
Lead Channel Pacing Threshold Pulse Width: 0.4 ms
Lead Channel Pacing Threshold Pulse Width: 0.4 ms
Lead Channel Sensing Intrinsic Amplitude: 0.875 mV
Lead Channel Sensing Intrinsic Amplitude: 0.875 mV
Lead Channel Sensing Intrinsic Amplitude: 7.125 mV
Lead Channel Sensing Intrinsic Amplitude: 7.125 mV
Lead Channel Setting Pacing Amplitude: 2 V
Lead Channel Setting Pacing Amplitude: 2.5 V
Lead Channel Setting Pacing Pulse Width: 0.8 ms
Lead Channel Setting Sensing Sensitivity: 2.8 mV
Zone Setting Status: 755011
Zone Setting Status: 755011

## 2024-10-13 LAB — CUP PACEART INCLINIC DEVICE CHECK
Date Time Interrogation Session: 20251124120729
Implantable Lead Connection Status: 753985
Implantable Lead Connection Status: 753985
Implantable Lead Implant Date: 20180723
Implantable Lead Implant Date: 20180723
Implantable Lead Location: 753859
Implantable Lead Location: 753860
Implantable Lead Model: 3830
Implantable Lead Model: 5076
Implantable Pulse Generator Implant Date: 20180723

## 2024-10-13 NOTE — Patient Instructions (Addendum)
 Medication Instructions:  NO CHANGES  Lab Work: NONE TO BE DONE TODAY.  Testing/Procedures: NONE  Follow-Up: At [facility], you and your health needs are our priority.  As part of our continuing mission to provide you with exceptional heart care, our providers are all part of one team.  This team includes your primary Cardiologist (physician) and Advanced Practice Providers or APPs (Physician Assistants and Nurse Practitioners) who all work together to provide you with the care you need, when you need it.  Your next appointment:   6 MONTHS  Provider:   Suzann Riddle, NP    We recommend signing up for the patient portal called MyChart.  Sign up information is provided on this After Visit Summary.  MyChart is used to connect with patients for Virtual Visits (Telemedicine).  Patients are able to view lab/test results, encounter notes, upcoming appointments, etc.  Non-urgent messages can be sent to your provider as well.   To learn more about what you can do with MyChart, go to [email or web address].   Other Instructions: OUR OFFICE WILL GIVE YOU A CALL WHEN IT IS TIME FOR YOUR DEVICE TO BE REPLACED SO DO NOT WORRY.

## 2024-10-27 ENCOUNTER — Telehealth: Payer: Self-pay

## 2024-10-27 DIAGNOSIS — Z950 Presence of cardiac pacemaker: Secondary | ICD-10-CM

## 2024-10-27 DIAGNOSIS — I442 Atrioventricular block, complete: Secondary | ICD-10-CM

## 2024-10-27 DIAGNOSIS — I480 Paroxysmal atrial fibrillation: Secondary | ICD-10-CM

## 2024-10-27 NOTE — Telephone Encounter (Signed)
 I called and spoke with the patient's wife, Sean [name] (ok per [facility]) and advised her the patient has reached elective replacement for his PPM.   I advised Sean [name], I will forward a message to Dr. Shaune nurse, Connell, and April Garrison (EP procedure scheduler) and advise them of the above.  Sean [name] is aware there is the possibility that Dr. Kennyth may need to add an additional wire (CS/ LBBAP) at the time of the patient's battery replacement.  Sean [name] advised they would like to make it through the holidays and then see Dr. Darron as previously scheduled, 11/26/24, prior to having his gen change/ upgrade.   I advised Sean [name], I will notify Carly and April and one of them will reach out to with possible procedure dates/ times.   Sean [name] is voices understanding and is agreeable.

## 2024-10-27 NOTE — Telephone Encounter (Signed)
 MDT/ PPM transmission:  Alert remote transmission:  RRT reached 10/26/24 - route to triage Follow up as scheduled  Suzann Riddle, NP saw patient in November and discussed gen change with possible addition of a CS/LB lead.  He is a patient of Dr. Shaune.

## 2024-10-28 ENCOUNTER — Other Ambulatory Visit: Payer: Self-pay

## 2024-10-28 DIAGNOSIS — Z950 Presence of cardiac pacemaker: Secondary | ICD-10-CM

## 2024-10-28 DIAGNOSIS — I480 Paroxysmal atrial fibrillation: Secondary | ICD-10-CM

## 2024-10-28 DIAGNOSIS — I442 Atrioventricular block, complete: Secondary | ICD-10-CM

## 2024-10-28 NOTE — Telephone Encounter (Addendum)
 Spoke with the patient's wife and scheduled the patient for a CRT-P upgrade on 1/28.

## 2024-11-06 ENCOUNTER — Encounter

## 2024-11-06 ENCOUNTER — Ambulatory Visit: Admitting: Cardiovascular Disease

## 2024-11-12 ENCOUNTER — Ambulatory Visit: Attending: Family Medicine

## 2024-11-13 ENCOUNTER — Encounter

## 2024-11-14 ENCOUNTER — Telehealth: Payer: Self-pay | Admitting: *Deleted

## 2024-11-14 ENCOUNTER — Encounter

## 2024-11-14 ENCOUNTER — Telehealth: Payer: Self-pay

## 2024-11-14 NOTE — Telephone Encounter (Signed)
 Spoke with patient's wife.  He will need a new remote monitor, [employer] is mailing them a new one to use until his upgrade at the end of January where he will receive a better, automatic model.   Patient is to call us  once they receive the current remote monitor so we can assist them with the set up and sending the transmission.

## 2024-11-14 NOTE — Telephone Encounter (Signed)
 Patient's wife called regarding a message received about him having missed a PPM transmission on 11/12/24. Attempted to assist with manual transmission but unsuccessful due to repeated error codes. Conference called Medtronic tech services for additional assistance.

## 2024-11-14 NOTE — Telephone Encounter (Signed)
-----   Message from Nurse Doreatha BROCKS, RN sent at 11/10/2024  8:04 AM EST ----- Regarding: FW: 1/28 CRT-P upgrade  ----- Message ----- From: Magdaline Doreatha, RN Sent: 10/28/2024   5:03 PM EST To: April Garrison, CMA; [place] MALVA Sever, RN;# Subject: 1/28 CRT-P upgrade                             Precert:   MD: Kennyth  Type of implant: CRT-P  Device manufacturer: Medtronic  Diagnosis: heart block  CPT code: Upgrade from dual PPM to CRT-P - 66770+66774  C-code(s), including quantity (if indicated):  Procedure scheduled (date/time): 1/28 at 1:30pm   Procedure:   Scrub given? No - patient will pick up on 1/7 in [place]  Medication instructions: hold your Eliquis  (Apixaban ) for 3 day(s)  Message sent to CVRR? No  Added to calendar? Yes  Orders entered? Yes  Letter complete? No, >30 days before procedure  Scheduled with cath lab? Yes  Labs ordered (CBC, BMET, PT/INR if on warfarin)? Yes  Dye allergy? No  Pre-meds ordered and instructions given? No, not needed  Letter method: Patient pick-up at appointment on 1/7 in [place]  Special instructions: n/a  H&P: 11/24   Follow-up:   Cassie/Angel, please schedule Routine.   Covering RN:   Please send this message to [employer], EP scheduler, EP Scheduling pool, and EP [employer].

## 2024-11-26 ENCOUNTER — Ambulatory Visit: Attending: Cardiovascular Disease | Admitting: Cardiovascular Disease

## 2024-11-26 ENCOUNTER — Encounter: Payer: Self-pay | Admitting: Cardiovascular Disease

## 2024-11-26 VITALS — BP 116/68 | HR 80 | Ht 70.5 in | Wt 235.5 lb

## 2024-11-26 DIAGNOSIS — I480 Paroxysmal atrial fibrillation: Secondary | ICD-10-CM | POA: Diagnosis not present

## 2024-11-26 DIAGNOSIS — I5022 Chronic systolic (congestive) heart failure: Secondary | ICD-10-CM | POA: Diagnosis not present

## 2024-11-26 DIAGNOSIS — I2510 Atherosclerotic heart disease of native coronary artery without angina pectoris: Secondary | ICD-10-CM

## 2024-11-26 DIAGNOSIS — E785 Hyperlipidemia, unspecified: Secondary | ICD-10-CM

## 2024-11-26 NOTE — Patient Instructions (Signed)

## 2024-11-26 NOTE — Progress Notes (Signed)
 "    Cardiology Office Note   Date:  11/26/2024   ID:  Sean [name]., DOB 03-21-1941, MRN [id number]  PCP:  [name], MD  Cardiologist: Dr. Fernande  Chief Complaint  Patient presents with   Follow-up    6 Month f/u no complaints today. Meds reviewed verbally with pt.      History of Present Illness: [name]. is a 84 y.o. male who is here today for follow-up visit regarding coronary artery disease and chronic systolic heart failure.   The patient has known history of pacemaker placement and PVCs.  Other medical problems include chronic kidney disease, atrial flutter, hypertension and obesity.    He had an echocardiogram done in May of 2019 which showed an EF of 35 to 40% with diffuse hypokinesis, mild mitral regurgitation and mildly dilated left atrium.  Due to this cardiomyopathy, he underwent CTA of the coronary arteries which showed evidence of three-vessel coronary artery disease with a left dominant system.  FFR was less than 0.6 in all vessels. Cardiac catheterization in 2019 showed left dominant coronary arteries with severe heavily calcified three-vessel coronary artery disease including subtotal occlusion of the mid LAD and ulcerated plaque in the proximal left circumflex.  He underwent CABG at [facility]. He underwent atrial flutter ablation in April 2021.  He was taken off anticoagulation with Eliquis .  He was hospitalized in July of 2024 with extensive right lower extremity DVT with suspected pulmonary embolism.  In addition, he had progressive acute on chronic kidney disease.  He required thrombectomy and has been placed back on Eliquis  since then.  Due to worsening renal disease, both spironolactone  and losartan  were discontinued.  Most recent echocardiogram in May of this year showed an EF of 40% with no significant valvular abnormalities.  He had IVC filter removal in October.  His pacemaker is at end-of-life and he is scheduled for upgrading his device to  biventricular pacer given that he is pacer dependent with mild cardiomyopathy.  This is scheduled in the near future with Dr. Kennyth.  The patient otherwise has been doing well with no chest pain or worsening dyspnea.  No issues with anticoagulation.  Past Medical History:  Diagnosis Date   Acute encephalopathy 01/25/2024   Anemia    Aortic atherosclerosis    Arthritis    Asthma    BPH with urinary obstruction    CAD (coronary artery disease)    a. LHC 7/19: ostLM 30%, ostLAD 30%, p-mLAD 99% mod calcified, ostD1 90%, ost-pLCx 80% ulcerative & mod calcified, p-mLCx 50%, OM2 50%, mod dz LPDA, RCA small w/ diffuse dz throughout; b. recommendation of CABG; c. 3-V CABG 08/26/18 (LIMA-LAD, VG-Diag, VG-LCx)   Cerebral microvascular disease    CKD (chronic kidney disease), stage III (HCC)    Complete heart block (HCC)    a.) symptomatic SB in setting of CHB --> dual chamber MDT PPM placed 06/11/2017   COPD (chronic obstructive pulmonary disease) (HCC)    CVA (cerebral vascular accident) (HCC)    a.) noted on CT head 01/25/2024: remote infarct involving the head of the right caudate and anterior limb of the right internal capsule   Deep vein thrombosis (DVT) of upper extremity (HCC) 06/17/2018   a.) LEFT subclavian --> Tx'd with apixaban    DVT, lower extremity, proximal, acute (HCC)    Dyslipidemia    GERD (gastroesophageal reflux disease)    Gout    Hemothorax on left    HFrEF (heart  failure with reduced ejection fraction) (HCC)    History of pulmonary embolus (PE)    HOH (hard of hearing)    Hypertension    Ischemic cardiomyopathy    Loculated pleural effusion (LEFT)    a.) s/p VATS 09/24/2018   Multiple renal cysts    Obesity    On apixaban  therapy    Paroxysmal atrial flutter (HCC)    a.) CHA2DS2VASc = 7 (age x2, HFrEF, HTN, PE/DVT x 2, vascular disease) as of 04/01/2024; b.) s/p RFCA 03/03/2020; c.) rate/rhythm maintained intrinsically without pharmacological intervention;  chronically anticoagulated with apixaban    Pneumonia 2000   Pre-diabetes    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 PPM-induced   S/P CABG x 3 08/26/2018   a.) LIMA-LAD, SVG-OM, SVG-diagonal   Sepsis (HCC)     Past Surgical History:  Procedure Laterality Date   A-FLUTTER ABLATION N/A 03/03/2020   Procedure: A-FLUTTER ABLATION;  Surgeon: Fernande Elspeth BROCKS, MD;  Location: [facility] INVASIVE CV LAB;  Service: Cardiovascular;  Laterality: N/A;   CORONARY ARTERY BYPASS GRAFT N/A 08/26/2018   Procedure: CORONARY ARTERY BYPASS GRAFTING (CABG) x3 , Using left internal mammory artery and right leg greater saphronious vein. Harvested endoscopically.;  Surgeon: Fleeta Hanford Coy, MD;  Location: [facility] OR;  Service: Open Heart Surgery;  Laterality: N/A;   EPICARDIAL PACING LEAD PLACEMENT N/A 08/26/2018   Procedure: LV PACING LEAD PLACEMENT;  Surgeon: Fleeta Hanford Coy, MD;  Location: [facility] OR;  Service: Thoracic;  Laterality: N/A;   EPICARDIAL PACING LEAD PLACEMENT Left 11/07/2018   Procedure: REVISE PACEMAKER LEAD LEFT CHEST;  Surgeon: Fleeta Hanford Coy, MD;  Location: [facility] OR;  Service: Thoracic;  Laterality: Left;   HOLEP-LASER ENUCLEATION OF THE PROSTATE WITH MORCELLATION N/A 04/04/2024   Procedure: ENUCLEATION, PROSTATE, USING LASER, WITH MORCELLATION;  Surgeon: Francisca Redell BROCKS, MD;  Location: [facility] ORS;  Service: Urology;  Laterality: N/A;   IVC FILTER INSERTION     IVC FILTER REMOVAL N/A 08/25/2024   Procedure: IVC FILTER REMOVAL;  Surgeon: Marea Selinda RAMAN, MD;  Location: [facility] INVASIVE CV LAB;  Service: Cardiovascular;  Laterality: N/A;   LEFT HEART CATH AND CORONARY ANGIOGRAPHY N/A 06/13/2018   Procedure: LEFT HEART CATH AND CORONARY ANGIOGRAPHY;  Surgeon: Darron Deatrice LABOR, MD;  Location: [facility] INVASIVE CV LAB;  Service: Cardiovascular;  Laterality: N/A;   LEG SURGERY     LUMBAR DISC  SURGERY     PACEMAKER IMPLANT N/A 06/11/2017   Procedure: Pacemaker Implant;  Surgeon: Waddell Danelle ORN, MD;  Location: [facility] INVASIVE CV LAB;  Service: Cardiovascular;  Laterality: N/A;   PERIPHERAL VASCULAR THROMBECTOMY Right 06/13/2023   Procedure: PERIPHERAL VASCULAR THROMBECTOMY;  Surgeon: Marea Selinda RAMAN, MD;  Location: [facility] INVASIVE CV LAB;  Service: Cardiovascular;  Laterality: Right;   PLEURAL EFFUSION DRAINAGE Left 09/24/2018   Procedure: DRAINAGE OF LOCULATED PLEURAL EFFUSION;  Surgeon: Fleeta Hanford Coy, MD;  Location: [facility] OR;  Service: Thoracic;  Laterality: Left;   TEE WITHOUT CARDIOVERSION N/A 08/26/2018   Procedure: TRANSESOPHAGEAL ECHOCARDIOGRAM (TEE);  Surgeon: Fleeta Hanford, Coy, MD;  Location: [facility] OR;  Service: Open Heart Surgery;  Laterality: N/A;   TEE WITHOUT CARDIOVERSION  03/03/2020   Procedure: Transesophageal Echocardiogram (Tee);  Surgeon: Fernande Elspeth BROCKS, MD;  Location: [facility] INVASIVE CV LAB;  Service: Cardiovascular;;   VIDEO ASSISTED THORACOSCOPY Left 09/24/2018   Procedure: VIDEO ASSISTED THORACOSCOPY;  Surgeon:  Fleeta Ochoa, Maude, MD;  Location: [facility] OR;  Service: Thoracic;  Laterality: Left;     Current Outpatient Medications  Medication Sig Dispense Refill   acetaminophen  (TYLENOL ) 500 MG tablet Take 500 mg by mouth every 6 (six) hours as needed for moderate pain or headache.     Acetaminophen  500 MG capsule Take 500 mg by mouth.     allopurinol  (ZYLOPRIM ) 100 MG tablet Take 100 mg by mouth at bedtime.     apixaban  (ELIQUIS ) 2.5 MG TABS tablet Take 1 tablet (2.5 mg total) by mouth 2 (two) times daily. 60 tablet 11   atorvastatin  (LIPITOR) 40 MG tablet TAKE ONE TABLET EVERY DAY (Patient taking differently: Take 40 mg by mouth at bedtime.) 90 tablet 3   EPINEPHrine  (PRIMATENE  MIST) 0.125 MG/ACT AERO Inhale 1 puff into the lungs daily as needed (shortness of breath).     ferrous sulfate  325 (65 FE) MG EC tablet Take 325 mg by mouth 2 (two) times daily.     finasteride (PROSCAR)  5 MG tablet Take 5 mg by mouth. (Patient taking differently: Take 5 mg by mouth daily.)     loratadine  (CLARITIN ) 10 MG tablet Take 1 tablet (10 mg total) by mouth daily. (Patient taking differently: Take 10 mg by mouth at bedtime.) 90 tablet 3   Multiple Vitamin (MULTIVITAMIN WITH MINERALS) TABS tablet Take 1 tablet by mouth daily.     omeprazole (PRILOSEC) 20 MG capsule Take 20 mg by mouth daily.     psyllium (REGULOID) 0.52 g capsule Take 0.52 g by mouth daily as needed (constipation).     loratadine  (CLARITIN ) 10 MG tablet Take 10 mg by mouth daily. (Patient not taking: Reported on 11/26/2024)     No current facility-administered medications for this visit.    Allergies:   Azithromycin and Erythromycin    Social History:  The patient  reports that he has never smoked. He has never used smokeless tobacco. He reports current alcohol use. He reports that he does not use drugs.   Family History:  The patient's family history includes Alcohol abuse in his brother and father; Diabetes in his brother; Stroke in his father, maternal grandfather, and mother.    ROS:  Please see the history of present illness.   Otherwise, review of systems are positive for none.   All other systems are reviewed and negative.    PHYSICAL EXAM: VS:  BP 116/68 (BP Location: Left Arm, Patient Position: Sitting, Cuff Size: Normal)   Pulse 80   Ht 5' 10.5 (1.791 m)   Wt 235 lb 8 oz (106.8 kg)   SpO2 97%   BMI 33.31 kg/m  , BMI Body mass index is 33.31 kg/m. GEN: Well nourished, well developed, in no acute distress  HEENT: normal  Neck: no JVD, carotid bruits, or masses Cardiac: RRR; no murmurs, rubs, or gallops, mild right leg swelling. Respiratory:  clear to auscultation bilaterally, normal work of breathing GI: soft, nontender, nondistended, + BS MS: no deformity or atrophy  Skin: warm and dry, no rash Neuro:  Strength and sensation are intact Psych: euthymic mood, full affect Radial pulses  normal.  EKG:  EKG is ordered today. The ekg ordered today demonstrates : Atrial-sensed ventricular-paced rhythm When compared with ECG of 13-Oct-2024 10:42, No significant change was found    Recent Labs: 01/27/2024: ALT 18 02/02/2024: BUN 52; Creatinine, Ser 3.07; Hemoglobin 10.4; Magnesium  1.8; Platelets 243; Potassium 4.4; Sodium 137    Lipid Panel    Component  Value Date/Time   CHOL 116 06/26/2018 0605   TRIG 64 06/26/2018 0605   HDL 43 06/26/2018 0605   CHOLHDL 2.7 06/26/2018 0605   VLDL 13 06/26/2018 0605   LDLCALC 60 06/26/2018 0605      Wt Readings from Last 3 Encounters:  11/26/24 235 lb 8 oz (106.8 kg)  10/13/24 235 lb (106.6 kg)  08/25/24 229 lb 12.8 oz (104.2 kg)          No data to display            ASSESSMENT AND PLAN:  1.  Coronary artery disease involving native coronary arteries without angina: He is doing well post CABG with no anginal symptoms.  No aspirin  given that he is on anticoagulation with Eliquis   2.  Chronic systolic heart failure: Likely due to ischemic cardiomyopathy and right ventricular pacing.  Most recent echocardiogram showed an EF of 40% .  He is off heart failure medications due to low blood pressure.  He is not a good candidate for an SGLT2 inhibitor given prostate issue and history of cystitis.  3.  Hyperlipidemia: Continue treatment with atorvastatin  with a target LDL of less than 70.  Most recent lipid profile showed an LDL slightly above 70 but his LDL is usually below 60.  Continue atorvastatin  for now.  4.  Status post pacemaker placement: I agree with upgrading his device to biventricular pacemaker given cardiomyopathy and pacer dependency.  6.  Paroxysmal atrial fibrillation: Continue lifelong anticoagulation with low-dose Eliquis  especially with known history of extensive DVT.    Disposition:   FU with me in 6 months  Signed,  Deatrice Cage, MD  11/26/2024 10:08 AM    [facility] "

## 2024-11-27 LAB — BASIC METABOLIC PANEL WITH GFR
BUN/Creatinine Ratio: 12 (ref 10–24)
BUN: 28 mg/dL — ABNORMAL HIGH (ref 8–27)
CO2: 22 mmol/L (ref 20–29)
Calcium: 9.2 mg/dL (ref 8.6–10.2)
Chloride: 101 mmol/L (ref 96–106)
Creatinine, Ser: 2.38 mg/dL — ABNORMAL HIGH (ref 0.76–1.27)
Glucose: 173 mg/dL — ABNORMAL HIGH (ref 70–99)
Potassium: 4.2 mmol/L (ref 3.5–5.2)
Sodium: 138 mmol/L (ref 134–144)
eGFR: 27 mL/min/1.73 — ABNORMAL LOW

## 2024-11-27 LAB — CBC WITH DIFFERENTIAL/PLATELET
Basophils Absolute: 0.1 x10E3/uL (ref 0.0–0.2)
Basos: 1 %
EOS (ABSOLUTE): 0.5 x10E3/uL — ABNORMAL HIGH (ref 0.0–0.4)
Eos: 6 %
Hematocrit: 40.5 % (ref 37.5–51.0)
Hemoglobin: 13.2 g/dL (ref 13.0–17.7)
Immature Grans (Abs): 0 x10E3/uL (ref 0.0–0.1)
Immature Granulocytes: 0 %
Lymphocytes Absolute: 1.6 x10E3/uL (ref 0.7–3.1)
Lymphs: 19 %
MCH: 32.8 pg (ref 26.6–33.0)
MCHC: 32.6 g/dL (ref 31.5–35.7)
MCV: 101 fL — ABNORMAL HIGH (ref 79–97)
Monocytes Absolute: 0.6 x10E3/uL (ref 0.1–0.9)
Monocytes: 7 %
Neutrophils Absolute: 5.8 x10E3/uL (ref 1.4–7.0)
Neutrophils: 67 %
Platelets: 199 x10E3/uL (ref 150–450)
RBC: 4.03 x10E6/uL — ABNORMAL LOW (ref 4.14–5.80)
RDW: 12.4 % (ref 11.6–15.4)
WBC: 8.6 x10E3/uL (ref 3.4–10.8)

## 2024-11-28 ENCOUNTER — Telehealth (HOSPITAL_COMMUNITY): Payer: Self-pay

## 2024-11-28 NOTE — Telephone Encounter (Signed)
 Attempted to reach patient to discuss upcoming procedure, no answer. Left VM for patient to return call.

## 2024-11-29 ENCOUNTER — Ambulatory Visit: Payer: Self-pay | Admitting: Cardiology

## 2024-12-13 ENCOUNTER — Ambulatory Visit

## 2024-12-15 ENCOUNTER — Encounter

## 2024-12-16 NOTE — Pre-Procedure Instructions (Signed)
 Instructed patient on the following items: Arrival time 1230 Nothing to eat or drink after midnight No meds AM of procedure Responsible person to drive you home and stay with you for 24 hrs Wash with special soap night before and morning of procedure If on anti-coagulant drug instructions Elqiuis- last dose Sunday 1/25.

## 2024-12-17 ENCOUNTER — Other Ambulatory Visit: Payer: Self-pay

## 2024-12-17 ENCOUNTER — Ambulatory Visit (HOSPITAL_COMMUNITY)
Admission: RE | Admit: 2024-12-17 | Discharge: 2024-12-17 | Disposition: A | Attending: Cardiology | Admitting: Cardiology

## 2024-12-17 ENCOUNTER — Encounter (HOSPITAL_COMMUNITY)
Admission: RE | Disposition: A | Discharge status: Home / Self Care | Payer: Self-pay | Source: Home / Self Care | Attending: Cardiology

## 2024-12-17 DIAGNOSIS — Z951 Presence of aortocoronary bypass graft: Secondary | ICD-10-CM | POA: Diagnosis not present

## 2024-12-17 DIAGNOSIS — Z45010 Encounter for checking and testing of cardiac pacemaker pulse generator [battery]: Secondary | ICD-10-CM | POA: Diagnosis present

## 2024-12-17 DIAGNOSIS — I130 Hypertensive heart and chronic kidney disease with heart failure and stage 1 through stage 4 chronic kidney disease, or unspecified chronic kidney disease: Secondary | ICD-10-CM | POA: Insufficient documentation

## 2024-12-17 DIAGNOSIS — I447 Left bundle-branch block, unspecified: Secondary | ICD-10-CM | POA: Insufficient documentation

## 2024-12-17 DIAGNOSIS — I442 Atrioventricular block, complete: Secondary | ICD-10-CM | POA: Insufficient documentation

## 2024-12-17 DIAGNOSIS — N183 Chronic kidney disease, stage 3 unspecified: Secondary | ICD-10-CM | POA: Insufficient documentation

## 2024-12-17 DIAGNOSIS — I5022 Chronic systolic (congestive) heart failure: Secondary | ICD-10-CM | POA: Insufficient documentation

## 2024-12-17 LAB — GLUCOSE, CAPILLARY: Glucose-Capillary: 127 mg/dL — ABNORMAL HIGH (ref 70–99)

## 2024-12-17 MED ORDER — CEFAZOLIN SODIUM-DEXTROSE 2-4 GM/100ML-% IV SOLN
INTRAVENOUS | Status: AC
  Filled 2024-12-17: qty 100

## 2024-12-17 MED ORDER — SODIUM CHLORIDE 0.9 % IV SOLN
INTRAVENOUS | Status: AC
  Filled 2024-12-17: qty 2

## 2024-12-17 MED ORDER — LIDOCAINE HCL 1 % IJ SOLN
INTRAMUSCULAR | Status: AC
  Filled 2024-12-17: qty 60

## 2024-12-17 MED ORDER — MIDAZOLAM HCL 5 MG/5ML IJ SOLN
INTRAMUSCULAR | Status: DC | PRN
  Administered 2024-12-17 (×3): 1 mg via INTRAVENOUS

## 2024-12-17 MED ORDER — HEPARIN (PORCINE) IN NACL 1000-0.9 UT/500ML-% IV SOLN
INTRAVENOUS | Status: DC | PRN
  Administered 2024-12-17: 500 mL

## 2024-12-17 MED ORDER — ONDANSETRON HCL 4 MG/2ML IJ SOLN: 4.0000 mg | Freq: Four times a day (QID) | INTRAMUSCULAR | Status: DC | PRN

## 2024-12-17 MED ORDER — SODIUM CHLORIDE 0.9 % IV SOLN
80.0000 mg | INTRAVENOUS | Status: AC
  Administered 2024-12-17: 80 mg

## 2024-12-17 MED ORDER — CEFAZOLIN SODIUM-DEXTROSE 2-4 GM/100ML-% IV SOLN
2.0000 g | INTRAVENOUS | Status: AC
  Administered 2024-12-17: 2 g via INTRAVENOUS

## 2024-12-17 MED ORDER — FENTANYL CITRATE (PF) 100 MCG/2ML IJ SOLN
INTRAMUSCULAR | Status: AC
  Filled 2024-12-17: qty 2

## 2024-12-17 MED ORDER — POVIDONE-IODINE 10 % EX SWAB: 2.0000 | Freq: Once | CUTANEOUS | Status: DC

## 2024-12-17 MED ORDER — SODIUM CHLORIDE 0.9 % IV SOLN: INTRAVENOUS | Status: DC

## 2024-12-17 MED ORDER — MIDAZOLAM HCL 2 MG/2ML IJ SOLN
INTRAMUSCULAR | Status: AC
  Filled 2024-12-17: qty 2

## 2024-12-17 MED ORDER — LIDOCAINE HCL (PF) 1 % IJ SOLN
INTRAMUSCULAR | Status: DC | PRN
  Administered 2024-12-17: 45 mL

## 2024-12-17 MED ORDER — CHLORHEXIDINE GLUCONATE 4 % EX SOLN
4.0000 | Freq: Once | CUTANEOUS | Status: DC
  Filled 2024-12-17: qty 60

## 2024-12-17 MED ORDER — APIXABAN 2.5 MG PO TABS: 2.5000 mg | ORAL_TABLET | Freq: Two times a day (BID) | ORAL | 11 refills | Status: AC

## 2024-12-17 MED ORDER — ACETAMINOPHEN 325 MG PO TABS: 325.0000 mg | ORAL_TABLET | ORAL | Status: DC | PRN

## 2024-12-17 MED ORDER — FENTANYL CITRATE (PF) 100 MCG/2ML IJ SOLN
INTRAMUSCULAR | Status: DC | PRN
  Administered 2024-12-17: 50 ug via INTRAVENOUS
  Administered 2024-12-17: 25 ug via INTRAVENOUS
  Administered 2024-12-17: 50 ug via INTRAVENOUS

## 2024-12-17 NOTE — H&P (Signed)
 "  Electrophysiology Note:   Date:  12/17/24  ID:  Sean [name]., DOB 07-Aug-1941, MRN [id number]   Primary Cardiologist: None Electrophysiologist: None       History of Present Illness:   Sean [name]. is a 84 y.o. male with h/o chronic systolic heart failure, coronary artery disease s/p CABG 2019, complete heart block s/p pacemaker, stage III chronic kidney disease, atrial flutter s/p ablation 02/2020, hypertension, obesity, DVT/PE who is being seen today for follow up of his cardiac device.   Discussed the use of AI scribe software for clinical note transcription with the patient, who gave verbal consent to proceed.   History of Present Illness Patient has upcoming urological surgery scheduled. He currently has an indwelling foley catheter. This causes him some discomfort. Otherwise he reports doing relatively well. He maintains an active lifestyle, engaging in daily activities without significant limitations. He is able to walk from his house to the car and perform household tasks. He has started walking up and down the driveway and plans to walk to a nearby school to increase his activity level. He states that he is able to descend his basement staircase and climb back up without issue. He experiences no shortness of breath, dizziness, or lightheadedness during these activities. He denies any history of chest pain since his bypass surgery. He is able to lay flat in bed without orthopnea or PND. No lower extremity swelling since starting treatment for his DVT. No new or acute complaints.    Interval: Patient presents today for gen change and upgrade to CRT. Reports feeling well. No new or acute complaints.   Review of systems complete and found to be negative unless listed in HPI.    EP Information / Studies Reviewed:    Echo 06/14/23:   1. Left ventricular ejection fraction, by estimation, is 45 to 50%. The  left ventricle has mildly decreased function. The left ventricle   demonstrates regional wall motion abnormalities (hypokinesis of the  anterior, anteroseptal and apical region). Left  ventricular diastolic parameters are consistent with Grade I diastolic  dysfunction (impaired relaxation).   2. Right ventricular systolic function is normal. The right ventricular  size is normal. There is moderately elevated pulmonary artery systolic  pressure. The estimated right ventricular systolic pressure is 54.0 mmHg.   3. Right atrial size was mildly dilated.   4. The mitral valve is normal in structure. No evidence of mitral valve  regurgitation. No evidence of mitral stenosis.   5. The aortic valve has an indeterminant number of cusps. Aortic valve  regurgitation is not visualized. Aortic valve sclerosis is present, with  no evidence of aortic valve stenosis.   6. The inferior vena cava is normal in size with greater than 50%  respiratory variability, suggesting right atrial pressure of 3 mmHg.    Risk Assessment/Calculations:     CHA2DS2-VASc Score = 5   This indicates a 7.2% annual risk of stroke. The patient's score is based upon: CHF History: 1 HTN History: 1 Diabetes History: 0 Stroke History: 0 Vascular Disease History: 1 Age Score: 2 Gender Score: 0               Physical Exam:    Today's Vitals   12/17/24 1157 12/17/24 1204  BP: 129/69   Pulse: 69   Resp: 16   Temp: 98 F (36.7 C)   TempSrc: Oral   SpO2: (!) 9%   Weight: 103.4 kg   Height: 5' 10 (  1.778 m)   PainSc:  0-No pain   Body mass index is 32.71 kg/m.     GEN: Well nourished, well developed in no acute distress NECK: No JVD CARDIAC: Normal rate, regular rhythm RESPIRATORY:  Clear to auscultation without rales, wheezing or rhonchi  ABDOMEN: Soft, non-distended EXTREMITIES:  No edema; No deformity    ASSESSMENT AND PLAN:     #. Complete heart block s/p dual chamber pacemaker: Presenting rhythm AS-VP.  #. Device at [facility] #. Chronic systolic heart failure: Well  compensated. No obvious signs or symptoms of HF. #. Wide, LBBB paced QRS - Patient due for gen change due to device at [facility]. He VP >99% with a wide, LBBB QRS. His EF is reduced at 40%. Given low EF and VP dependency, he meets criteria for upgrade to CRT-P. He has an LV epicardial lead tunneled to pocket from 2019. We will see if this lead functions and if so utilize if for pacing. If not, then we will attempt placement of transvenous CS lead. Risks, benefits, and alternatives to pulse generator replacement and possible upgrade to CRT were discussed in detail today.  The patient understands that risks include but are not limited to bleeding, infection, pneumothorax, perforation, tamponade, vascular damage, renal failure, MI, stroke, death, inappropriate shocks, damage to his existing leads, and lead dislodgement and wishes to proceed.     Signed, Sean Kitty, MD    "

## 2024-12-17 NOTE — Discharge Instructions (Addendum)

## 2024-12-18 ENCOUNTER — Encounter (HOSPITAL_COMMUNITY): Payer: Self-pay | Admitting: Cardiology

## 2024-12-18 ENCOUNTER — Encounter: Payer: Self-pay | Admitting: Emergency Medicine

## 2024-12-18 MED FILL — Lidocaine HCl Local Inj 1%: INTRAMUSCULAR | Qty: 45 | Status: AC

## 2024-12-18 MED FILL — Midazolam HCl Inj 2 MG/2ML (Base Equivalent): INTRAMUSCULAR | Qty: 3 | Status: AC

## 2024-12-30 ENCOUNTER — Ambulatory Visit

## 2025-02-05 ENCOUNTER — Encounter

## 2025-02-11 ENCOUNTER — Ambulatory Visit: Payer: Self-pay

## 2025-02-11 ENCOUNTER — Encounter

## 2025-03-19 ENCOUNTER — Ambulatory Visit: Admitting: Cardiology

## 2025-05-07 ENCOUNTER — Encounter

## 2025-05-13 ENCOUNTER — Encounter

## 2025-05-13 ENCOUNTER — Ambulatory Visit: Payer: Self-pay

## 2025-05-19 ENCOUNTER — Ambulatory Visit: Admitting: Physician Assistant

## 2025-08-05 ENCOUNTER — Ambulatory Visit: Admitting: Urology

## 2025-08-06 ENCOUNTER — Encounter

## 2025-08-12 ENCOUNTER — Ambulatory Visit: Payer: Self-pay

## 2025-08-12 ENCOUNTER — Encounter

## 2025-11-05 ENCOUNTER — Encounter

## 2025-11-11 ENCOUNTER — Encounter

## 2025-11-11 ENCOUNTER — Ambulatory Visit: Payer: Self-pay
